# Patient Record
Sex: Female | Born: 1965
Health system: Southern US, Community
[De-identification: ages and names within clinical notes are randomized; demographics above are authoritative.]

## PROBLEM LIST (undated history)

## (undated) ENCOUNTER — Inpatient Hospital Stay: Admission: EM | Disposition: A | Discharge status: Home / Self Care | Payer: Self-pay | Source: Home / Self Care

## (undated) DIAGNOSIS — M810 Age-related osteoporosis without current pathological fracture: Secondary | ICD-10-CM

## (undated) DIAGNOSIS — N189 Chronic kidney disease, unspecified: Secondary | ICD-10-CM

## (undated) DIAGNOSIS — R55 Syncope and collapse: Secondary | ICD-10-CM

## (undated) DIAGNOSIS — F419 Anxiety disorder, unspecified: Secondary | ICD-10-CM

## (undated) DIAGNOSIS — R112 Nausea with vomiting, unspecified: Secondary | ICD-10-CM

## (undated) DIAGNOSIS — E2740 Unspecified adrenocortical insufficiency: Secondary | ICD-10-CM

## (undated) DIAGNOSIS — M1990 Unspecified osteoarthritis, unspecified site: Secondary | ICD-10-CM

## (undated) DIAGNOSIS — Z8489 Family history of other specified conditions: Secondary | ICD-10-CM

## (undated) DIAGNOSIS — E785 Hyperlipidemia, unspecified: Secondary | ICD-10-CM

## (undated) DIAGNOSIS — T4145XA Adverse effect of unspecified anesthetic, initial encounter: Secondary | ICD-10-CM

## (undated) DIAGNOSIS — M5416 Radiculopathy, lumbar region: Secondary | ICD-10-CM

## (undated) DIAGNOSIS — R251 Tremor, unspecified: Secondary | ICD-10-CM

## (undated) DIAGNOSIS — J45909 Unspecified asthma, uncomplicated: Secondary | ICD-10-CM

## (undated) DIAGNOSIS — T8859XA Other complications of anesthesia, initial encounter: Secondary | ICD-10-CM

## (undated) DIAGNOSIS — R51 Headache: Secondary | ICD-10-CM

## (undated) DIAGNOSIS — F32A Depression, unspecified: Secondary | ICD-10-CM

## (undated) DIAGNOSIS — M797 Fibromyalgia: Secondary | ICD-10-CM

## (undated) DIAGNOSIS — Z9889 Other specified postprocedural states: Secondary | ICD-10-CM

## (undated) DIAGNOSIS — M7918 Myalgia, other site: Secondary | ICD-10-CM

## (undated) DIAGNOSIS — I10 Essential (primary) hypertension: Secondary | ICD-10-CM

## (undated) HISTORY — PX: ABDOMINAL HYSTERECTOMY: SHX81

## (undated) HISTORY — PX: BACK SURGERY: SHX140

## (undated) HISTORY — PX: KNEE SURGERY: SHX244

## (undated) HISTORY — PX: OTHER SURGICAL HISTORY: SHX169

## (undated) HISTORY — PX: CHOLECYSTECTOMY: SHX55

## (undated) HISTORY — PX: EYE SURGERY: SHX253

---

## 1997-12-03 ENCOUNTER — Inpatient Hospital Stay (HOSPITAL_COMMUNITY): Admission: RE | Admit: 1997-12-03 | Discharge: 1997-12-06 | Payer: Self-pay | Admitting: Obstetrics & Gynecology

## 2010-03-27 ENCOUNTER — Encounter: Admission: RE | Admit: 2010-03-27 | Discharge: 2010-03-27 | Payer: Self-pay | Admitting: Family Medicine

## 2011-03-22 ENCOUNTER — Other Ambulatory Visit: Payer: Self-pay | Admitting: Family Medicine

## 2011-03-22 DIAGNOSIS — Z1231 Encounter for screening mammogram for malignant neoplasm of breast: Secondary | ICD-10-CM

## 2011-03-30 ENCOUNTER — Ambulatory Visit
Admission: RE | Admit: 2011-03-30 | Discharge: 2011-03-30 | Disposition: A | Discharge status: Home / Self Care | Payer: 59 | Source: Ambulatory Visit | Attending: Family Medicine | Admitting: Family Medicine

## 2011-03-30 DIAGNOSIS — Z1231 Encounter for screening mammogram for malignant neoplasm of breast: Secondary | ICD-10-CM

## 2011-04-02 ENCOUNTER — Other Ambulatory Visit: Payer: Self-pay | Admitting: Family Medicine

## 2011-04-02 DIAGNOSIS — R928 Other abnormal and inconclusive findings on diagnostic imaging of breast: Secondary | ICD-10-CM

## 2011-04-09 ENCOUNTER — Ambulatory Visit
Admission: RE | Admit: 2011-04-09 | Discharge: 2011-04-09 | Disposition: A | Discharge status: Home / Self Care | Payer: 59 | Source: Ambulatory Visit | Attending: Family Medicine | Admitting: Family Medicine

## 2011-04-09 DIAGNOSIS — R928 Other abnormal and inconclusive findings on diagnostic imaging of breast: Secondary | ICD-10-CM

## 2012-04-17 ENCOUNTER — Other Ambulatory Visit: Payer: Self-pay | Admitting: Family Medicine

## 2012-04-17 DIAGNOSIS — Z1231 Encounter for screening mammogram for malignant neoplasm of breast: Secondary | ICD-10-CM

## 2012-05-01 ENCOUNTER — Ambulatory Visit
Admission: RE | Admit: 2012-05-01 | Discharge: 2012-05-01 | Disposition: A | Discharge status: Home / Self Care | Payer: 59 | Source: Ambulatory Visit | Attending: Family Medicine | Admitting: Family Medicine

## 2012-05-01 DIAGNOSIS — Z1231 Encounter for screening mammogram for malignant neoplasm of breast: Secondary | ICD-10-CM

## 2012-07-06 NOTE — Progress Notes (Signed)
stress test  With resting ekg 08-03-2011 dr Lynelle Doctor on chart lov note dr Lynelle Doctor cardiology 07-26-2011 on chart

## 2012-07-07 ENCOUNTER — Ambulatory Visit (HOSPITAL_COMMUNITY)
Admission: RE | Admit: 2012-07-07 | Discharge: 2012-07-07 | Disposition: A | Discharge status: Home / Self Care | Payer: 59 | Source: Ambulatory Visit | Attending: Orthopedic Surgery | Admitting: Orthopedic Surgery

## 2012-07-07 ENCOUNTER — Encounter (HOSPITAL_COMMUNITY)
Admission: RE | Admit: 2012-07-07 | Discharge: 2012-07-07 | Disposition: A | Discharge status: Home / Self Care | Payer: 59 | Source: Ambulatory Visit | Attending: Orthopedic Surgery | Admitting: Orthopedic Surgery

## 2012-07-07 ENCOUNTER — Encounter (HOSPITAL_COMMUNITY): Payer: Self-pay

## 2012-07-07 ENCOUNTER — Ambulatory Visit (HOSPITAL_COMMUNITY)
Admission: RE | Admit: 2012-07-07 | Discharge: 2012-07-07 | Disposition: A | Discharge status: Home / Self Care | Payer: 59 | Source: Ambulatory Visit | Attending: Surgical | Admitting: Surgical

## 2012-07-07 ENCOUNTER — Encounter (HOSPITAL_COMMUNITY): Payer: Self-pay | Admitting: Pharmacy Technician

## 2012-07-07 DIAGNOSIS — M47817 Spondylosis without myelopathy or radiculopathy, lumbosacral region: Secondary | ICD-10-CM | POA: Insufficient documentation

## 2012-07-07 DIAGNOSIS — M545 Low back pain, unspecified: Secondary | ICD-10-CM | POA: Insufficient documentation

## 2012-07-07 DIAGNOSIS — Z01818 Encounter for other preprocedural examination: Secondary | ICD-10-CM | POA: Insufficient documentation

## 2012-07-07 DIAGNOSIS — Z01812 Encounter for preprocedural laboratory examination: Secondary | ICD-10-CM | POA: Insufficient documentation

## 2012-07-07 HISTORY — DX: Adverse effect of unspecified anesthetic, initial encounter: T41.45XA

## 2012-07-07 HISTORY — DX: Essential (primary) hypertension: I10

## 2012-07-07 HISTORY — DX: Other complications of anesthesia, initial encounter: T88.59XA

## 2012-07-07 LAB — CBC
HCT: 42.5 % (ref 36.0–46.0)
MCH: 30.6 pg (ref 26.0–34.0)
MCHC: 33.6 g/dL (ref 30.0–36.0)
MCV: 90.8 fL (ref 78.0–100.0)
Platelets: 253 10*3/uL (ref 150–400)
RDW: 12.7 % (ref 11.5–15.5)
WBC: 11.4 10*3/uL — ABNORMAL HIGH (ref 4.0–10.5)

## 2012-07-07 LAB — COMPREHENSIVE METABOLIC PANEL
ALT: 8 U/L (ref 0–35)
AST: 12 U/L (ref 0–37)
Albumin: 3.5 g/dL (ref 3.5–5.2)
Alkaline Phosphatase: 41 U/L (ref 39–117)
BUN: 13 mg/dL (ref 6–23)
CO2: 31 mEq/L (ref 19–32)
Calcium: 9.1 mg/dL (ref 8.4–10.5)
Chloride: 102 mEq/L (ref 96–112)
Creatinine, Ser: 0.89 mg/dL (ref 0.50–1.10)
GFR calc Af Amer: >90 mL/min (ref >90)
GFR calc non Af Amer: 78 mL/min — ABNORMAL LOW (ref >90)
Glucose, Bld: 97 mg/dL (ref 70–99)
Potassium: 4.2 mEq/L (ref 3.5–5.1)
Sodium: 137 mEq/L (ref 135–145)
Total Bilirubin: 0.5 mg/dL (ref 0.3–1.2)
Total Protein: 6.1 g/dL (ref 6.0–8.3)

## 2012-07-07 LAB — URINALYSIS, ROUTINE W REFLEX MICROSCOPIC
Bilirubin Urine: NEGATIVE
Glucose, UA: NEGATIVE mg/dL
Ketones, ur: NEGATIVE mg/dL
Leukocytes, UA: NEGATIVE
Nitrite: NEGATIVE
Protein, ur: NEGATIVE mg/dL
Specific Gravity, Urine: 1.024 (ref 1.005–1.030)
Urobilinogen, UA: 1 mg/dL (ref 0.0–1.0)
pH: 6.5 (ref 5.0–8.0)

## 2012-07-07 LAB — URINE MICROSCOPIC-ADD ON

## 2012-07-07 LAB — APTT: aPTT: 35 seconds (ref 24–37)

## 2012-07-07 LAB — PROTIME-INR
INR: 1 (ref 0.00–1.49)
Prothrombin Time: 13.1 seconds (ref 11.6–15.2)

## 2012-07-07 NOTE — Patient Instructions (Addendum)
20 MCKENZE NISSLEY  07/07/2012   Your procedure is scheduled on:  07-14-2011  Report to [facility] at  0700 AM.  Call this number if you have problems the morning of surgery: [phone]   Remember:   Do not eat food or drink liquids:After Midnight.  .  Take these medicines the morning of surgery with A SIP OF WATER: xanax, hydrocodone if needed, metoprolol succinate, paxil   Do not wear jewelry or make up.  Do not wear lotions, powders, or perfumes. You may wear deodorant.    Do not bring valuables to the hospital.  Contacts, dentures or bridgework may not be worn into surgery.  Leave suitcase in the car. After surgery it may be brought to your room.  For patients admitted to the hospital, checkout time is 11:00 AM the day of discharge                             Patients discharged the day of surgery will not be allowed to drive home. If going home same day of surgery, you must have someone stay with you the first 24 hours at home and arrange for some one to drive you home from hospital.    Special Instructions: See [facility] Preparing for Surgery instruction sheet. Women do not shave legs or underarms for 12 hours before showers. Men may shave face morning of surgery.    Please read over the following fact sheets that you were given: MRSA Information, incentive spirometer fact sheet  Cain Sieve [facility] pre op nurse phone number [phone], call if needed

## 2012-07-07 NOTE — Progress Notes (Signed)
mirco ua results faxed to dr Darrelyn Hillock by epic

## 2012-07-10 LAB — MRSA CULTURE

## 2012-07-11 NOTE — H&P (Signed)
Abigail [name] DOB: [birth year]  Chief Complaint: back pain  History of Present Illness The patient is a 46 year old female who is scheduled for a central decompression lumbar laminectomy on July 13, 2012 by Dr. Darrelyn Hillock. Abigail [name] presented today with the chief complaint of back pain. She's also having some pain across the greater trochanter of the right hip. She had these issues about 10 months ago when she saw Dr. Darrelyn Hillock. At that time he felt that she was having mostly pain from bursitis in her right hip. He put her on a Prednisone pack. She reports that she did somewhat better in regards to the hip, but over the last month she's had increased pain in her back. She reports that she's had so much pain that she was unable to work. She's having pain when she coughs or sneezes, mainly in the right side of her low back. She has noticed that she does have some pain that radiates from her low back through her right buttock, into the posterior thigh. No numbness or tingling in the legs. She reports that bladder and bowel functions are normal. She's had no difficulty with that. She's not having any issues in the left leg, no pain in the groin, and she's not having any pain into the knees. She does report that, in her job, she is required to stand for long periods of time on concrete, and she feels like this increased her symptoms.MRI shows that she has a large herniated disc at L5-S1 that is central and to the right   Problem List/Past Medical Spinal Stenosis, Lumbar (724.02) Bursitis, Hip (726.5) Depression Hypertension Anxiety Disorder  Allergies No Known Drug Allergies.    Family History Depression. mother and sister Diabetes Mellitus. grandmother mothers side Heart Disease. father Cerebrovascular Accident. grandmother mothers side Congestive Heart Failure. grandmother mothers side Hypertension. mother and father Liver Disease, Chronic. mother   Social  History Exercise. Exercises weekly; does running / walking Children. 2 Current work status. working full time Living situation. live with spouse Tobacco use. current every day smoker; smoke(d) less than 1/2 pack(s) per day Marital status. married Number of flights of stairs before winded. 2-3 Alcohol use. current drinker; drinks beer; only occasionally per week   Past Surgical History Hysterectomy. partial (cancerous)    Review of Systems General:Present- Appetite Loss and Weight Loss. Not Present- Chills, Fever, Night Sweats, Fatigue, Feeling sick and Weight Gain. Skin:Not Present- Itching, Rash, Skin Color Changes, Ulcer, Psoriasis and Change in Hair or Nails. Neck:Not Present- Swollen Glands and Neck Mass. Respiratory:Not Present- Snoring, Chronic Cough, Bloody sputum and Dyspnea. Cardiovascular:Present- Leg Cramps and Palpitations. Not Present- Shortness of Breath, Chest Pain and Swelling of Extremities. Gastrointestinal:Not Present- Bloody Stool, Heartburn, Abdominal Pain, Vomiting, Nausea and Incontinence of Stool. Female Genitourinary:Not Present- Blood in Urine, Menstrual Irregularities, Frequency, Incontinence and Nocturia. Musculoskeletal:Present- Muscle Pain and Back Pain. Not Present- Muscle Weakness, Joint Stiffness, Joint Swelling and Joint Pain. Neurological:Present- Tingling, Numbness and Burning. Not Present- Tremor, Headaches and Dizziness. Psychiatric:Present- Anxiety and Depression. Not Present- Memory Loss. Endocrine:Not Present- Cold Intolerance, Heat Intolerance, Excessive hunger and Excessive Thirst. Hematology:Not Present- Abnormal Bleeding, Anemia, Blood Clots and Easy Bruising.  Vitals Weight: 150 lb Height: 65.25 in Body Surface Area: 1.77 m Body Mass Index: 24.77 kg/m BP: 138/95 (Sitting, Left Arm, Standard)    Physical Exam On physical exam today, she is alert and oriented, in no acute distress. She has a  normal, painless ROM of the hips and the knees. Calves  are soft and nontender. Sensation and circulation are intact in the lower extremities. She is slightly weaker in the right dorsiflexors compared to the left. She does have a positive SLR on the right. She is tender over the greater trochanteric bursa of the right hip, nontender on the left. She does have some tenderness in the paralumbar region. No masses or tumors palpated. She does have pain with lumbar ROM that radiates into the right buttock.Regular rate and rhythm. No murmurs. Lungs clear to auscultation. Abdomen soft and nontender. Neck supple. Oral cavity negative.    RADIOGRAPHS: She does have degenerative discs primarily at L4-5 and L5-S1. No fractures are noted. A review of her films of the hips is unremarkable. No arthritic changes or fractures are noted.    Assessment and Plan Lumbar disc herniation Central decompression lumbar laminectomy L5-S1     [name], PA-C

## 2012-07-11 NOTE — Progress Notes (Signed)
Spoke with tp by phone aware surgery time changed to 1215, arrive 0945 [facility] 07-13-12

## 2012-07-13 ENCOUNTER — Ambulatory Visit (HOSPITAL_COMMUNITY): Payer: 59

## 2012-07-13 ENCOUNTER — Encounter (HOSPITAL_COMMUNITY): Payer: Self-pay | Admitting: *Deleted

## 2012-07-13 ENCOUNTER — Encounter (HOSPITAL_COMMUNITY)
Admission: RE | Disposition: A | Discharge status: Home / Self Care | Payer: Self-pay | Source: Ambulatory Visit | Attending: Orthopedic Surgery

## 2012-07-13 ENCOUNTER — Encounter (HOSPITAL_COMMUNITY): Payer: Self-pay | Admitting: Anesthesiology

## 2012-07-13 ENCOUNTER — Ambulatory Visit (HOSPITAL_COMMUNITY): Payer: 59 | Admitting: Anesthesiology

## 2012-07-13 ENCOUNTER — Observation Stay (HOSPITAL_COMMUNITY)
Admission: RE | Admit: 2012-07-13 | Discharge: 2012-07-14 | DRG: 491 | Disposition: A | Discharge status: Home / Self Care | Payer: 59 | Source: Ambulatory Visit | Attending: Orthopedic Surgery | Admitting: Orthopedic Surgery

## 2012-07-13 DIAGNOSIS — M5106 Intervertebral disc disorders with myelopathy, lumbar region: Principal | ICD-10-CM | POA: Insufficient documentation

## 2012-07-13 DIAGNOSIS — I10 Essential (primary) hypertension: Secondary | ICD-10-CM | POA: Insufficient documentation

## 2012-07-13 DIAGNOSIS — M76899 Other specified enthesopathies of unspecified lower limb, excluding foot: Secondary | ICD-10-CM | POA: Insufficient documentation

## 2012-07-13 DIAGNOSIS — M48061 Spinal stenosis, lumbar region without neurogenic claudication: Secondary | ICD-10-CM | POA: Diagnosis present

## 2012-07-13 HISTORY — PX: LUMBAR LAMINECTOMY/DECOMPRESSION MICRODISCECTOMY: SHX5026

## 2012-07-13 SURGERY — LUMBAR LAMINECTOMY/DECOMPRESSION MICRODISCECTOMY 1 LEVEL
Anesthesia: General | Site: Back | Wound class: Clean

## 2012-07-13 MED ORDER — LACTATED RINGERS IV SOLN
INTRAVENOUS | Status: DC
  Administered 2012-07-13: 1000 mL via INTRAVENOUS
  Administered 2012-07-13: 13:00:00 via INTRAVENOUS

## 2012-07-13 MED ORDER — IRBESARTAN 150 MG PO TABS
150.0000 mg | ORAL_TABLET | Freq: Every day | ORAL | Status: DC
  Administered 2012-07-13 – 2012-07-14 (×2): 150 mg via ORAL
  Filled 2012-07-13 (×2): qty 1

## 2012-07-13 MED ORDER — PAROXETINE HCL 20 MG PO TABS
20.0000 mg | ORAL_TABLET | Freq: Every morning | ORAL | Status: DC
  Administered 2012-07-14: 20 mg via ORAL
  Filled 2012-07-13: qty 1

## 2012-07-13 MED ORDER — ROCURONIUM BROMIDE 100 MG/10ML IV SOLN
INTRAVENOUS | Status: DC | PRN
  Administered 2012-07-13: 40 mg via INTRAVENOUS

## 2012-07-13 MED ORDER — EPHEDRINE SULFATE 50 MG/ML IJ SOLN
INTRAMUSCULAR | Status: DC | PRN
  Administered 2012-07-13: 5 mg via INTRAVENOUS
  Administered 2012-07-13: 10 mg via INTRAVENOUS

## 2012-07-13 MED ORDER — CEFAZOLIN SODIUM 1-5 GM-% IV SOLN
1.0000 g | Freq: Three times a day (TID) | INTRAVENOUS | Status: AC
  Administered 2012-07-13 – 2012-07-14 (×3): 1 g via INTRAVENOUS
  Filled 2012-07-13 (×3): qty 50

## 2012-07-13 MED ORDER — OXYCODONE HCL 5 MG PO TABS: 5.0000 mg | ORAL_TABLET | Freq: Once | ORAL | Status: DC | PRN

## 2012-07-13 MED ORDER — METHOCARBAMOL 100 MG/ML IJ SOLN
500.0000 mg | Freq: Four times a day (QID) | INTRAVENOUS | Status: DC | PRN
  Administered 2012-07-13 (×2): 500 mg via INTRAVENOUS
  Filled 2012-07-13 (×2): qty 5

## 2012-07-13 MED ORDER — MIDAZOLAM HCL 5 MG/5ML IJ SOLN
INTRAMUSCULAR | Status: DC | PRN
  Administered 2012-07-13 (×2): 2 mg via INTRAVENOUS

## 2012-07-13 MED ORDER — HYDROMORPHONE HCL PF 1 MG/ML IJ SOLN
0.5000 mg | INTRAMUSCULAR | Status: DC | PRN
  Administered 2012-07-13 – 2012-07-14 (×5): 1 mg via INTRAVENOUS
  Filled 2012-07-13 (×5): qty 1

## 2012-07-13 MED ORDER — HYDROMORPHONE HCL PF 1 MG/ML IJ SOLN: 0.2500 mg | INTRAMUSCULAR | Status: DC | PRN

## 2012-07-13 MED ORDER — CEFAZOLIN SODIUM-DEXTROSE 2-3 GM-% IV SOLR
2.0000 g | INTRAVENOUS | Status: AC
  Administered 2012-07-13: 2 g via INTRAVENOUS

## 2012-07-13 MED ORDER — ACETAMINOPHEN 325 MG PO TABS
650.0000 mg | ORAL_TABLET | ORAL | Status: DC | PRN
  Administered 2012-07-14: 650 mg via ORAL
  Filled 2012-07-13: qty 2

## 2012-07-13 MED ORDER — ACETAMINOPHEN 10 MG/ML IV SOLN
INTRAVENOUS | Status: AC
  Filled 2012-07-13: qty 100

## 2012-07-13 MED ORDER — LIDOCAINE HCL (CARDIAC) 20 MG/ML IV SOLN
INTRAVENOUS | Status: DC | PRN
  Administered 2012-07-13: 80 mg via INTRAVENOUS

## 2012-07-13 MED ORDER — METHOCARBAMOL 500 MG PO TABS
500.0000 mg | ORAL_TABLET | Freq: Four times a day (QID) | ORAL | Status: DC | PRN
  Administered 2012-07-14: 500 mg via ORAL
  Filled 2012-07-13: qty 1

## 2012-07-13 MED ORDER — FLEET ENEMA 7-19 GM/118ML RE ENEM: 1.0000 | ENEMA | Freq: Once | RECTAL | Status: AC | PRN

## 2012-07-13 MED ORDER — ASPIRIN EC 81 MG PO TBEC
81.0000 mg | DELAYED_RELEASE_TABLET | Freq: Every day | ORAL | Status: DC
  Administered 2012-07-13 – 2012-07-14 (×2): 81 mg via ORAL
  Filled 2012-07-13 (×2): qty 1

## 2012-07-13 MED ORDER — ALPRAZOLAM 0.5 MG PO TABS
0.5000 mg | ORAL_TABLET | Freq: Four times a day (QID) | ORAL | Status: DC | PRN
  Administered 2012-07-13 (×2): 0.5 mg via ORAL
  Filled 2012-07-13 (×2): qty 1

## 2012-07-13 MED ORDER — ACETAMINOPHEN 650 MG RE SUPP: 650.0000 mg | RECTAL | Status: DC | PRN

## 2012-07-13 MED ORDER — BUPIVACAINE LIPOSOME 1.3 % IJ SUSP
20.0000 mL | INTRAMUSCULAR | Status: AC
  Administered 2012-07-13: 20 mL
  Filled 2012-07-13: qty 20

## 2012-07-13 MED ORDER — BACITRACIN-NEOMYCIN-POLYMYXIN 400-5-5000 EX OINT
TOPICAL_OINTMENT | CUTANEOUS | Status: DC | PRN
  Administered 2012-07-13: 1 via TOPICAL

## 2012-07-13 MED ORDER — PROPOFOL 10 MG/ML IV BOLUS
INTRAVENOUS | Status: DC | PRN
  Administered 2012-07-13: 160 mg via INTRAVENOUS

## 2012-07-13 MED ORDER — CEFAZOLIN SODIUM-DEXTROSE 2-3 GM-% IV SOLR
INTRAVENOUS | Status: AC
  Filled 2012-07-13: qty 50

## 2012-07-13 MED ORDER — BACITRACIN ZINC 500 UNIT/GM EX OINT
TOPICAL_OINTMENT | CUTANEOUS | Status: AC
  Filled 2012-07-13: qty 15

## 2012-07-13 MED ORDER — METOPROLOL SUCCINATE ER 25 MG PO TB24
25.0000 mg | ORAL_TABLET | Freq: Two times a day (BID) | ORAL | Status: DC
  Administered 2012-07-13 – 2012-07-14 (×2): 25 mg via ORAL
  Filled 2012-07-13 (×3): qty 1

## 2012-07-13 MED ORDER — BUPIVACAINE HCL (PF) 0.25 % IJ SOLN
INTRAMUSCULAR | Status: DC | PRN
  Administered 2012-07-13: 20 mL

## 2012-07-13 MED ORDER — MENTHOL 3 MG MT LOZG: 1.0000 | LOZENGE | OROMUCOSAL | Status: DC | PRN

## 2012-07-13 MED ORDER — POLYETHYLENE GLYCOL 3350 17 G PO PACK: 17.0000 g | PACK | Freq: Every day | ORAL | Status: DC | PRN

## 2012-07-13 MED ORDER — AMLODIPINE BESYLATE 5 MG PO TABS
5.0000 mg | ORAL_TABLET | Freq: Every day | ORAL | Status: DC
  Administered 2012-07-13 – 2012-07-14 (×2): 5 mg via ORAL
  Filled 2012-07-13 (×2): qty 1

## 2012-07-13 MED ORDER — FENTANYL CITRATE 0.05 MG/ML IJ SOLN
INTRAMUSCULAR | Status: DC | PRN
  Administered 2012-07-13: 50 ug via INTRAVENOUS
  Administered 2012-07-13 (×2): 100 ug via INTRAVENOUS

## 2012-07-13 MED ORDER — OXYCODONE HCL 5 MG/5ML PO SOLN
5.0000 mg | Freq: Once | ORAL | Status: DC | PRN
Start: 2012-07-13 — End: 2012-07-13
  Filled 2012-07-13: qty 5

## 2012-07-13 MED ORDER — ACETAMINOPHEN 10 MG/ML IV SOLN
INTRAVENOUS | Status: DC | PRN
  Administered 2012-07-13: 1000 mg via INTRAVENOUS

## 2012-07-13 MED ORDER — MEPERIDINE HCL 50 MG/ML IJ SOLN: 6.2500 mg | INTRAMUSCULAR | Status: DC | PRN

## 2012-07-13 MED ORDER — SODIUM CHLORIDE 0.9 % IR SOLN
Status: DC | PRN
  Administered 2012-07-13: 13:00:00

## 2012-07-13 MED ORDER — THROMBIN 5000 UNITS EX SOLR
CUTANEOUS | Status: AC
  Filled 2012-07-13: qty 10000

## 2012-07-13 MED ORDER — OXYCODONE-ACETAMINOPHEN 5-325 MG PO TABS
1.0000 | ORAL_TABLET | ORAL | Status: DC | PRN
  Administered 2012-07-13 – 2012-07-14 (×5): 2 via ORAL
  Filled 2012-07-13 (×5): qty 2

## 2012-07-13 MED ORDER — PROMETHAZINE HCL 25 MG/ML IJ SOLN: 6.2500 mg | INTRAMUSCULAR | Status: DC | PRN

## 2012-07-13 MED ORDER — AMLODIPINE-OLMESARTAN 5-20 MG PO TABS: 1.0000 | ORAL_TABLET | Freq: Every day | ORAL | Status: DC

## 2012-07-13 MED ORDER — THROMBIN 5000 UNITS EX SOLR
CUTANEOUS | Status: DC | PRN
  Administered 2012-07-13: 5000 [IU] via TOPICAL

## 2012-07-13 MED ORDER — HYDROMORPHONE HCL PF 1 MG/ML IJ SOLN
INTRAMUSCULAR | Status: AC
  Filled 2012-07-13: qty 1

## 2012-07-13 MED ORDER — BISACODYL 10 MG RE SUPP: 10.0000 mg | Freq: Every day | RECTAL | Status: DC | PRN

## 2012-07-13 MED ORDER — ACETAMINOPHEN 10 MG/ML IV SOLN: 1000.0000 mg | Freq: Once | INTRAVENOUS | Status: DC | PRN

## 2012-07-13 MED ORDER — AMLODIPINE BESYLATE 5 MG PO TABS
5.0000 mg | ORAL_TABLET | Freq: Once | ORAL | Status: AC
  Administered 2012-07-13: 5 mg via ORAL
  Filled 2012-07-13: qty 1

## 2012-07-13 MED ORDER — LACTATED RINGERS IV SOLN
INTRAVENOUS | Status: DC
  Administered 2012-07-13: 15:00:00 via INTRAVENOUS

## 2012-07-13 MED ORDER — ONDANSETRON HCL 4 MG/2ML IJ SOLN: 4.0000 mg | INTRAMUSCULAR | Status: DC | PRN

## 2012-07-13 MED ORDER — ONDANSETRON HCL 4 MG/2ML IJ SOLN
INTRAMUSCULAR | Status: DC | PRN
  Administered 2012-07-13: 4 mg via INTRAVENOUS

## 2012-07-13 MED ORDER — PHENOL 1.4 % MT LIQD: 1.0000 | OROMUCOSAL | Status: DC | PRN

## 2012-07-13 MED ORDER — HYDROCODONE-ACETAMINOPHEN 5-325 MG PO TABS: 1.0000 | ORAL_TABLET | ORAL | Status: DC | PRN

## 2012-07-13 SURGICAL SUPPLY — 42 items
BAG ZIPLOCK 12X15 (MISCELLANEOUS) ×2 IMPLANT
BENZOIN TINCTURE PRP APPL 2/3 (GAUZE/BANDAGES/DRESSINGS) ×2 IMPLANT
CLEANER TIP ELECTROSURG 2X2 (MISCELLANEOUS) ×2 IMPLANT
CLOTH BEACON ORANGE TIMEOUT ST (SAFETY) ×2 IMPLANT
CONT SPECI 4OZ STER CLIK (MISCELLANEOUS) ×2 IMPLANT
DRAIN PENROSE 18X1/4 LTX STRL (WOUND CARE) IMPLANT
DRAPE MICROSCOPE LEICA (MISCELLANEOUS) ×2 IMPLANT
DRAPE POUCH INSTRU U-SHP 10X18 (DRAPES) ×2 IMPLANT
DRAPE SURG 17X11 SM STRL (DRAPES) ×2 IMPLANT
DRSG ADAPTIC 3X8 NADH LF (GAUZE/BANDAGES/DRESSINGS) ×2 IMPLANT
DRSG PAD ABDOMINAL 8X10 ST (GAUZE/BANDAGES/DRESSINGS) ×2 IMPLANT
DURAPREP 26ML APPLICATOR (WOUND CARE) ×2 IMPLANT
ELECT REM PT RETURN 9FT ADLT (ELECTROSURGICAL) ×2
ELECTRODE REM PT RTRN 9FT ADLT (ELECTROSURGICAL) ×1 IMPLANT
GLOVE BIOGEL PI IND STRL 8 (GLOVE) ×2 IMPLANT
GLOVE BIOGEL PI INDICATOR 8 (GLOVE) ×2
GLOVE ECLIPSE 8.0 STRL XLNG CF (GLOVE) ×4 IMPLANT
GOWN PREVENTION PLUS LG XLONG (DISPOSABLE) ×4 IMPLANT
GOWN STRL REIN XL XLG (GOWN DISPOSABLE) ×4 IMPLANT
KIT BASIN OR (CUSTOM PROCEDURE TRAY) ×2 IMPLANT
KIT POSITIONING SURG ANDREWS (MISCELLANEOUS) ×2 IMPLANT
MANIFOLD NEPTUNE II (INSTRUMENTS) ×2 IMPLANT
NEEDLE SPNL 18GX3.5 QUINCKE PK (NEEDLE) ×4 IMPLANT
NS IRRIG 1000ML POUR BTL (IV SOLUTION) ×2 IMPLANT
PATTIES SURGICAL .5 X.5 (GAUZE/BANDAGES/DRESSINGS) IMPLANT
PATTIES SURGICAL .75X.75 (GAUZE/BANDAGES/DRESSINGS) IMPLANT
PATTIES SURGICAL 1X1 (DISPOSABLE) IMPLANT
PIN SAFETY NICK PLATE  2 MED (MISCELLANEOUS)
PIN SAFETY NICK PLATE 2 MED (MISCELLANEOUS) IMPLANT
POSITIONER SURGICAL ARM (MISCELLANEOUS) ×2 IMPLANT
SPONGE GAUZE 4X4 12PLY (GAUZE/BANDAGES/DRESSINGS) ×2 IMPLANT
SPONGE LAP 4X18 X RAY DECT (DISPOSABLE) IMPLANT
SPONGE SURGIFOAM ABS GEL 100 (HEMOSTASIS) ×2 IMPLANT
STAPLER VISISTAT 35W (STAPLE) IMPLANT
SUT VIC AB 0 CT1 27 (SUTURE) ×1
SUT VIC AB 0 CT1 27XBRD ANTBC (SUTURE) ×1 IMPLANT
SUT VIC AB 1 CT1 27 (SUTURE) ×4
SUT VIC AB 1 CT1 27XBRD ANTBC (SUTURE) ×4 IMPLANT
TAPE CLOTH SURG 6X10 WHT LF (GAUZE/BANDAGES/DRESSINGS) ×2 IMPLANT
TOWEL OR 17X26 10 PK STRL BLUE (TOWEL DISPOSABLE) ×4 IMPLANT
TRAY LAMINECTOMY (CUSTOM PROCEDURE TRAY) ×2 IMPLANT
WATER STERILE IRR 1500ML POUR (IV SOLUTION) ×2 IMPLANT

## 2012-07-13 NOTE — Brief Op Note (Signed)
07/13/2012  1:43 PM  PATIENT:  Abigail [name]  46 y.o. female  PRE-OPERATIVE DIAGNOSIS:  herniated disc L-5_S-1 on the Right and Spinal Stenosis.  POST-OPERATIVE DIAGNOSIS:  herniated disc L-5_S-1 on the right and Spinal Stenosis.  PROCEDURE:  Procedure(s) (LRB) with comments: LUMBAR LAMINECTOMY/DECOMPRESSION MICRODISCECTOMY 1 LEVEL (N/A) - Central Decompression Lumbar Laminectomy L5 - S1 (X-Ray) for Spinal Stenosis and Large Herniated Lumbar Disc.  SURGEON:  Surgeon(s) and Role:    * Jacki Cones, MD - Primary    * Javier Docker, MD - Assisting   ASSISTANTS: Antionette Poles MD  ANESTHESIA:   general  EBL:  Total I/O In: 1000 [I.V.:1000] Out: -   BLOOD ADMINISTERED:none  DRAINS: none   LOCAL MEDICATIONS USED:  MARCAINE 0.25%,20cc at beginning of case and Bupivicaine 20cc at end of case.    SPECIMEN:  Source of Specimen:  L-5_S-1 interspace  DISPOSITION OF SPECIMEN:  PATHOLOGY  COUNTS:  YES  TOURNIQUET:  * No tourniquets in log *  DICTATION: .Other Dictation: Dictation Number [id number]  PLAN OF CARE: Admit to inpatient   PATIENT DISPOSITION:  PACU - hemodynamically stable.   Delay start of Pharmacological VTE agent (>24hrs) due to surgical blood loss or risk of bleeding: yes

## 2012-07-13 NOTE — Transfer of Care (Signed)
Immediate Anesthesia Transfer of Care Note  Patient: Abigail [name]  Procedure(s) Performed: Procedure(s) (LRB) with comments: LUMBAR LAMINECTOMY/DECOMPRESSION MICRODISCECTOMY 1 LEVEL (N/A) - Central Decompression Lumbar Laminectomy L5 - S1 (X-Ray)  Patient Location: PACU  Anesthesia Type: General  Level of Consciousness: awake, alert  and oriented  Airway & Oxygen Therapy: Patient Spontanous Breathing and Patient connected to face mask oxygen  Post-op Assessment: Report given to PACU RN and Post -op Vital signs reviewed and stable  Post vital signs: Reviewed and stable  Complications: No apparent anesthesia complications

## 2012-07-13 NOTE — Anesthesia Postprocedure Evaluation (Signed)
Anesthesia Post Note  Patient: Abigail [name]  Procedure(s) Performed: Procedure(s) (LRB): LUMBAR LAMINECTOMY/DECOMPRESSION MICRODISCECTOMY 1 LEVEL (N/A)  Anesthesia type: General  Patient location: PACU  Post pain: Pain level controlled  Post assessment: Post-op Vital signs reviewed  Last Vitals: BP 103/68  Pulse 64  Temp 36.8 C  Resp 17  Ht 5\' 6"  (1.676 m)  Wt 133 lb (60.328 kg)  BMI 21.47 kg/m2  SpO2 96%  Post vital signs: Reviewed  Level of consciousness: sedated  Complications: No apparent anesthesia complications

## 2012-07-13 NOTE — Anesthesia Preprocedure Evaluation (Addendum)
Anesthesia Evaluation  Patient identified by MRN, date of birth, ID band Patient awake    Reviewed: Allergy & Precautions, H&P , NPO status , Patient's Chart, lab work & pertinent test results, reviewed documented beta blocker date and time   History of Anesthesia Complications Negative for: history of anesthetic complications  Airway Mallampati: II TM Distance: >3 FB Neck ROM: Full    Dental  (+) Dental Advisory Given and Teeth Intact   Pulmonary neg pulmonary ROS,  breath sounds clear to auscultation  Pulmonary exam normal       Cardiovascular hypertension, Pt. on medications and Pt. on home beta blockers - CAD, - Past MI and - CHF Rhythm:Regular Rate:Normal     Neuro/Psych negative neurological ROS  negative psych ROS   GI/Hepatic negative GI ROS, Neg liver ROS,   Endo/Other  negative endocrine ROS  Renal/GU negative Renal ROS     Musculoskeletal   Abdominal   Peds  Hematology negative hematology ROS (+)   Anesthesia Other Findings   Reproductive/Obstetrics                          Anesthesia Physical Anesthesia Plan  ASA: II  Anesthesia Plan: General   Post-op Pain Management:    Induction: Intravenous  Airway Management Planned: Oral ETT  Additional Equipment:   Intra-op Plan:   Post-operative Plan: Extubation in OR  Informed Consent: I have reviewed the patients History and Physical, chart, labs and discussed the procedure including the risks, benefits and alternatives for the proposed anesthesia with the patient or authorized representative who has indicated his/her understanding and acceptance.   Dental advisory given  Plan Discussed with: CRNA  Anesthesia Plan Comments:         Anesthesia Quick Evaluation

## 2012-07-13 NOTE — Interval H&P Note (Signed)
History and Physical Interval Note:  07/13/2012 11:34 AM  Abigail [name]  has presented today for surgery, with the diagnosis of herniated disc  The various methods of treatment have been discussed with the patient and family. After consideration of risks, benefits and other options for treatment, the patient has consented to  Procedure(s) (LRB) with comments: LUMBAR LAMINECTOMY/DECOMPRESSION MICRODISCECTOMY 1 LEVEL (N/A) - Central Decompression Lumbar Laminectomy L5 - S1 (X-Ray) as a surgical intervention .  The patient's history has been reviewed, patient examined, no change in status, stable for surgery.  I have reviewed the patient's chart and labs.  Questions were answered to the patient's satisfaction.     Laci Frenkel A

## 2012-07-14 ENCOUNTER — Encounter (HOSPITAL_COMMUNITY): Payer: Self-pay | Admitting: Orthopedic Surgery

## 2012-07-14 MED ORDER — METHOCARBAMOL 500 MG PO TABS: 500.0000 mg | ORAL_TABLET | Freq: Four times a day (QID) | ORAL | Status: DC | PRN

## 2012-07-14 MED ORDER — OXYCODONE-ACETAMINOPHEN 5-325 MG PO TABS: 1.0000 | ORAL_TABLET | ORAL | Status: DC | PRN

## 2012-07-14 NOTE — Discharge Summary (Signed)
Physician Discharge Summary  Patient ID: Abigail [name] MRN: [id number] DOB/AGE: [birth year] 44 y.o.  Admit date: 07/13/2012 Discharge date: 07/14/2012  Admission Diagnoses:Herniated Lumbar Disc at L-5_S-1 on right.  Discharge Diagnoses:  Active Problems:  Intervertebral disc disorder with myelopathy, lumbar region  Spinal stenosis, lumbar   Discharged Condition: Improved  Hospital Course: No Complications  Consults: Physical Therapy  Significant Diagnostic Studies: radiology: X-Ray: In OR  Treatments: antibiotics: Ancef  Discharge Exam: Blood pressure 112/65, pulse 60, temperature 98.1 F (36.7 C), temperature source Oral, resp. rate 16, height 5\' 6"  (1.676 m), weight 60.328 kg (133 lb), SpO2 96.00%. Neurologic: Grossly normal  Disposition:   Discharge Orders    Future Orders Please Complete By Expires   Diet - low sodium heart healthy      Call MD / Call 911      Comments:   If you experience chest pain or shortness of breath, CALL 911 and be transported to the hospital emergency room.  If you develope a fever above 101 F, pus (white drainage) or increased drainage or redness at the wound, or calf pain, call your surgeon's office.   Constipation Prevention      Comments:   Drink plenty of fluids.  Prune juice may be helpful.  You may use a stool softener, such as Colace (over the counter) 100 mg twice a day.  Use MiraLax (over the counter) for constipation as needed.   Increase activity slowly as tolerated      Discharge instructions      Comments:   Change your dressing daily. Shower only, no tub bath. Call if any temperatures greater than 101 or any wound complications: [phone] during the day and ask for Dr. Jeannetta Ellis nurse, Mackey [facility].   Driving restrictions      Comments:   No driving for one weeks       Med List    Warning       Cannot display patient medications because the patient has not yet arrived.          Signed: Lysa Livengood  A 07/14/2012, 7:49 AM

## 2012-07-14 NOTE — Op Note (Signed)
NAMETAMSEN, REIST              ACCOUNT NO.:  [id number]  MEDICAL RECORD NO.:  [id number]  LOCATION:  1601                         FACILITY:  [facility]  PHYSICIAN:  Georges Lynch. Gus Littler, M.D.DATE OF BIRTH:  1966-01-27  DATE OF PROCEDURE:  07/13/2012 DATE OF DISCHARGE:                              OPERATIVE REPORT   PREOPERATIVE DIAGNOSES: 1. Spinal stenosis, L5-S1. 2. Large central into the right herniated lumbar disk at L5-S1.  All     her symptoms were in the right lower extremity with minimal on the     left.  SURGEON:  Georges Lynch. Darrelyn Hillock, MD  ASSISTANT:  Jene Every, MD  OPERATION: 1. Decompression, lumbar laminectomy for spinal stenosis at L5-S1. 2. Microdiskectomy L5-S1 on the right.  PROCEDURE:  Under general anesthesia, routine orthopedic prep and drape was carried out with the patient on spinal table.  At this time, the appropriate time-out was carried out in usual fashion prior to making incision.  Also marked the appropriate right side of the back in the holding area.  Two needles were placed in the back for localization purposes.  X-ray was taken.  Incision was made over L5-S1.  Bleeders identified and cauterized.  Prior to making the incision, I injected 20 mL of 0.25% Marcaine with epinephrine on each side of the lamina for bleeding control.  The incision then was carried down to the L5-S1 space.  An x-ray was taken to verify the position.  At this point, we did our decompressive lumbar laminectomy at L5-S1 on the right for severe spinal stenosis.  On the AP view of the back, she had a complete overlap or shingle effect of the L5 lamina.  At this particular time, I then carried out my hemilaminectomy and decompressed the lateral recess. We brought the microscope in, protected the dura at all times with the D'Errico retractor and cottonoids.  We first identified the S1 root.  We gradually retracted the root and then cauterized lateral recess veins. We had to go far  out wide to decompress the recess because of stenosis. We then did a nice foraminotomy for the S1 root as well.  The hockey- stick was inserted proximally and distally to make sure we had clearance of the stenosis in both directions.  Following that, we isolated the disk, cruciate incision was made in the disk space.  We then did a microdiskectomy in the usual fashion.  Note, the disk space was collapsed and all the material was subligamentous.  We teased the material out with a Epstein curette and the nerve hook.  After we thoroughly decompressed the dura and root, we re-examined the area to make sure we had a complete diskectomy with that.  The root now was totally free and easily movable as well as the dura.  Thoroughly irrigated out the area, loosely applied some thrombin-soaked Gelfoam, closed the wound layers in usual fashion except I left a small distal deep and proximal deep portion wound open for drainage purposes.  Subcu was closed with 0 Vicryl.  Skin was closed with metal staples.  Note, I injected 20 mL of Exparel into the muscles at the end of the procedure.  Sterile Neosporin dressings  were applied.  She had 2 g of IV Ancef.          ______________________________ Georges Lynch. Darrelyn Hillock, M.D.     RAG/MEDQ  D:  07/13/2012  T:  07/14/2012  Job:  [id number]

## 2012-07-14 NOTE — Progress Notes (Signed)
Subjective: Much better today. She some right leg pain today.    Objective: Vital signs in last 24 hours: Temp:  [97.3 F (36.3 C)-98.3 F (36.8 C)] 98.1 F (36.7 C) (10/04 0550) Pulse Rate:  [51-73] 60  (10/04 0550) Resp:  [14-20] 16  (10/04 0550) BP: (98-113)/(52-71) 112/65 mmHg (10/04 0550) SpO2:  [93 %-100 %] 96 % (10/04 0550) Weight:  [60.328 kg (133 lb)] 60.328 kg (133 lb) (10/03 1500)  Intake/Output from previous day: 10/03 0701 - 10/04 0700 In: 3355.3 [P.O.:240; I.V.:2910.3; IV Piggyback:205] Out: 2775 [Urine:2775] Intake/Output this shift:    No results found for this basename: HGB:5 in the last 72 hours No results found for this basename: WBC:2,RBC:2,HCT:2,PLT:2 in the last 72 hours No results found for this basename: NA:2,K:2,CL:2,CO2:2,BUN:2,CREATININE:2,GLUCOSE:2,CALCIUM:2 in the last 72 hours No results found for this basename: LABPT:2,INR:2 in the last 72 hours  Neurovascular intact  Assessment/Plan: DC Today after ambulation.   Derryl Uher A 07/14/2012, 7:14 AM

## 2012-07-14 NOTE — Evaluation (Signed)
Occupational Therapy Evaluation Patient Details Name: Abigail [name] MRN: [id number] DOB: 1966-01-06 Today's Date: 07/14/2012 Time: 4098-1191 OT Time Calculation (min): 16 min  OT Assessment / Plan / Recommendation Clinical Impression  This 46 year old female was admitted for L4-5 decompression.  All education was completed and pt/husband verbalized understanding.  No further OT is needed at this time.      OT Assessment       Follow Up Recommendations  No OT follow up    Barriers to Discharge      Equipment Recommendations  Rolling walker with 5" wheels;3 in 1 bedside comode (pt unable to borrow)    Recommendations for Other Services    Frequency       Precautions / Restrictions Precautions Precautions: Back Precaution Booklet Issued: Yes (comment) Restrictions Weight Bearing Restrictions: No   Pertinent Vitals/Pain 5/10 back; repositioned    ADL  Toilet Transfer: Simulated;Min guard (chair ambulated in room and back to chair) Tub/Shower Transfer Method:  (educated on side stepping over tub) Transfers/Ambulation Related to ADLs: min guard walking and mod cues for back precautions:  husband present and good with cueing pt ADL Comments: reviewed adls with back precautions:  pt had a little pulling when crossing legs: showed alternative methods    OT Diagnosis:    OT Problem List:   OT Treatment Interventions:     OT Goals    Visit Information  Last OT Received On: 07/14/12 Assistance Needed: +1    Subjective Data  Subjective: I found that out the hard way   Prior Functioning     Home Living Lives With: Spouse Available Help at Discharge: Family Type of Home: House Home Access: Stairs to enter Secretary/administrator of Steps: 4 Entrance Stairs-Rails: Right;Left Home Layout: One level Bathroom Shower/Tub: Engineer, manufacturing systems: Standard Home Adaptive Equipment: Straight cane (Pt thinks she can borrow RW) Additional Comments: has old tub:  3:1  may fit inside Prior Function Level of Independence: Independent Able to Take Stairs?: Yes Driving: Yes Comments: husband will be with her for 4 days Communication Communication: No difficulties         Vision/Perception     Cognition  Overall Cognitive Status: Appears within functional limits for tasks assessed/performed Arousal/Alertness: Awake/alert Orientation Level: Appears intact for tasks assessed Behavior During Session: [facility] for tasks performed Cognition - Other Comments: needs cues for back precautions    Extremity/Trunk Assessment Right Upper Extremity Assessment RUE ROM/Strength/Tone: [facility] for tasks assessed Left Upper Extremity Assessment LUE ROM/Strength/Tone: [facility] for tasks assessed Right Lower Extremity Assessment RLE ROM/Strength/Tone: [facility] for tasks assessed Left Lower Extremity Assessment LLE ROM/Strength/Tone: [facility] for tasks assessed     Mobility Bed Mobility Bed Mobility: Supine to Sit;Sit to Supine Supine to Sit: 4: Min guard;5: Supervision Sit to Supine: 4: Min guard Details for Bed Mobility Assistance: cues for correct log roll technique and move to upright sitting Transfers Sit to Stand: 4: Min guard Stand to Sit: 4: Min guard;5: Supervision Details for Transfer Assistance: cues for use of UEs to self assist and to limit fwd flex     Shoulder Instructions     Exercise     Balance     End of Session    GO     Abigail [name] 07/14/2012, 9:15 AM Marica Otter, OTR/L [phone] 07/14/2012

## 2012-07-14 NOTE — Evaluation (Signed)
Physical Therapy Evaluation Patient Details Name: Abigail [name] MRN: [id number] DOB: 07/28/1966 Today's Date: 07/14/2012 Time: 6962-9528 PT Time Calculation (min): 20 min  PT Assessment / Plan / Recommendation Clinical Impression  Pt s/p back surgery presents with functional mobility limitations 2* back pain and post op back precautions    PT Assessment  Patient needs continued PT services    Follow Up Recommendations  No PT follow up    Barriers to Discharge        Equipment Recommendations  None recommended by PT (Pt states she can borrow RW)    Recommendations for Other Services OT consult   Frequency 7X/week    Precautions / Restrictions Precautions Precautions: Back Precaution Booklet Issued: Yes (comment) Restrictions Weight Bearing Restrictions: No   Pertinent Vitals/Pain 5/10; premedicated      Mobility  Bed Mobility Bed Mobility: Supine to Sit;Sit to Supine Supine to Sit: 4: Min guard;5: Supervision Sit to Supine: 4: Min guard Details for Bed Mobility Assistance: cues for correct log roll technique and move to upright sitting Transfers Transfers: Stand to Sit;Sit to Stand Sit to Stand: 4: Min guard;5: Supervision Stand to Sit: 4: Min guard;5: Supervision Details for Transfer Assistance: cues for use of UEs to self assist and to limit fwd flex Ambulation/Gait Ambulation/Gait Assistance: 4: Min guard;5: Supervision Ambulation Distance (Feet): 200 Feet Assistive device: Rolling walker Ambulation/Gait Assistance Details: Pt requires min assist without RW.  Cues for position from RW, posture and Narrowed BOS Gait Pattern: Step-to pattern;Step-through pattern Stairs: Yes Stairs Assistance: 4: Min assist Stairs Assistance Details (indicate cue type and reason): cues for sequence and for method for husband to assist Stair Management Technique: One rail Left;Step to pattern;Forwards Number of Stairs: 3     Shoulder Instructions     Exercises     PT  Diagnosis: Difficulty walking  PT Problem List: Decreased activity tolerance;Decreased mobility;Decreased knowledge of use of DME;Pain;Decreased knowledge of precautions PT Treatment Interventions: DME instruction;Gait training;Stair training;Functional mobility training;Therapeutic activities;Patient/family education   PT Goals Acute Rehab PT Goals PT Goal Formulation: With patient Time For Goal Achievement: 07/16/12 Potential to Achieve Goals: Good Pt will go Supine/Side to Sit: with supervision PT Goal: Supine/Side to Sit - Progress: Goal set today Pt will go Sit to Supine/Side: with supervision PT Goal: Sit to Supine/Side - Progress: Goal set today Pt will go Sit to Stand: with supervision PT Goal: Sit to Stand - Progress: Goal set today Pt will go Stand to Sit: with supervision PT Goal: Stand to Sit - Progress: Goal set today Pt will Ambulate: >150 feet;with rolling walker;with supervision PT Goal: Ambulate - Progress: Goal set today Pt will Go Up / Down Stairs: 3-5 stairs;with min assist;with least restrictive assistive device PT Goal: Up/Down Stairs - Progress: Goal set today  Visit Information  Last PT Received On: 07/14/12 Assistance Needed: +1    Subjective Data  Subjective: I was up to the bathroom but had to lean on my husband to get there Patient Stated Goal: resume previous lifestyle with decreased pain   Prior Functioning  Home Living Lives With: Spouse Available Help at Discharge: Family Type of Home: House Home Access: Stairs to enter Secretary/administrator of Steps: 4 Entrance Stairs-Rails: Right;Left Home Layout: One level Home Adaptive Equipment: Straight cane (Pt thinks she can borrow RW) Prior Function Level of Independence: Independent Able to Take Stairs?: Yes Driving: Yes Communication Communication: No difficulties    Cognition  Overall Cognitive Status: Appears within functional limits for  tasks assessed/performed Arousal/Alertness:  Awake/alert Orientation Level: Appears intact for tasks assessed Behavior During Session: [facility] for tasks performed    Extremity/Trunk Assessment Right Upper Extremity Assessment RUE ROM/Strength/Tone: [facility] for tasks assessed Left Upper Extremity Assessment LUE ROM/Strength/Tone: [facility] for tasks assessed Right Lower Extremity Assessment RLE ROM/Strength/Tone: [facility] for tasks assessed Left Lower Extremity Assessment LLE ROM/Strength/Tone: [facility] for tasks assessed   Balance    End of Session PT - End of Session Activity Tolerance: Patient tolerated treatment well Patient left: in chair;with call bell/phone within reach;with family/visitor present Nurse Communication: Mobility status  GP     Manish Ruggiero 07/14/2012, 9:06 AM

## 2012-07-14 NOTE — Progress Notes (Signed)
D/C instructions reviewed w/ pt and SO. All questions answered, no further questions. Pt d/c in w/c to SO's car by NT. Pt in stable condition, in possession of d/c instructions, scripts, and all personal belongings including newly delivered walker.

## 2013-02-22 ENCOUNTER — Encounter (HOSPITAL_COMMUNITY): Payer: Self-pay | Admitting: Pharmacy Technician

## 2013-02-28 ENCOUNTER — Encounter (HOSPITAL_COMMUNITY): Payer: Self-pay

## 2013-02-28 ENCOUNTER — Encounter (HOSPITAL_COMMUNITY)
Admission: RE | Admit: 2013-02-28 | Discharge: 2013-02-28 | Disposition: A | Discharge status: Home / Self Care | Payer: 59 | Source: Ambulatory Visit | Attending: Orthopedic Surgery | Admitting: Orthopedic Surgery

## 2013-02-28 ENCOUNTER — Ambulatory Visit (HOSPITAL_COMMUNITY)
Admission: RE | Admit: 2013-02-28 | Discharge: 2013-02-28 | Disposition: A | Discharge status: Home / Self Care | Payer: 59 | Source: Ambulatory Visit | Attending: Orthopedic Surgery | Admitting: Orthopedic Surgery

## 2013-02-28 DIAGNOSIS — Z01818 Encounter for other preprocedural examination: Secondary | ICD-10-CM | POA: Insufficient documentation

## 2013-02-28 DIAGNOSIS — M5137 Other intervertebral disc degeneration, lumbosacral region: Secondary | ICD-10-CM | POA: Insufficient documentation

## 2013-02-28 DIAGNOSIS — Z01810 Encounter for preprocedural cardiovascular examination: Secondary | ICD-10-CM | POA: Insufficient documentation

## 2013-02-28 DIAGNOSIS — M51379 Other intervertebral disc degeneration, lumbosacral region without mention of lumbar back pain or lower extremity pain: Secondary | ICD-10-CM | POA: Insufficient documentation

## 2013-02-28 DIAGNOSIS — Z01812 Encounter for preprocedural laboratory examination: Secondary | ICD-10-CM | POA: Insufficient documentation

## 2013-02-28 DIAGNOSIS — M5144 Schmorl's nodes, thoracic region: Secondary | ICD-10-CM | POA: Insufficient documentation

## 2013-02-28 HISTORY — DX: Unspecified osteoarthritis, unspecified site: M19.90

## 2013-02-28 HISTORY — DX: Headache: R51

## 2013-02-28 LAB — CBC
MCH: 30.9 pg (ref 26.0–34.0)
MCV: 91 fL (ref 78.0–100.0)
Platelets: 298 10*3/uL (ref 150–400)
RDW: 12.2 % (ref 11.5–15.5)
WBC: 11.1 10*3/uL — ABNORMAL HIGH (ref 4.0–10.5)

## 2013-02-28 LAB — URINE MICROSCOPIC-ADD ON

## 2013-02-28 LAB — COMPREHENSIVE METABOLIC PANEL
ALT: 12 U/L (ref 0–35)
AST: 15 U/L (ref 0–37)
Albumin: 3.8 g/dL (ref 3.5–5.2)
Alkaline Phosphatase: 49 U/L (ref 39–117)
BUN: 9 mg/dL (ref 6–23)
CO2: 31 mEq/L (ref 19–32)
Calcium: 9.3 mg/dL (ref 8.4–10.5)
Chloride: 101 mEq/L (ref 96–112)
Creatinine, Ser: 0.73 mg/dL (ref 0.50–1.10)
GFR calc Af Amer: >90 mL/min (ref >90)
GFR calc non Af Amer: >90 mL/min (ref >90)
Glucose, Bld: 94 mg/dL (ref 70–99)
Potassium: 4.3 mEq/L (ref 3.5–5.1)
Sodium: 137 mEq/L (ref 135–145)
Total Bilirubin: 0.3 mg/dL (ref 0.3–1.2)
Total Protein: 6.7 g/dL (ref 6.0–8.3)

## 2013-02-28 LAB — URINALYSIS, ROUTINE W REFLEX MICROSCOPIC
Glucose, UA: NEGATIVE mg/dL
Ketones, ur: NEGATIVE mg/dL
Leukocytes, UA: NEGATIVE
Nitrite: NEGATIVE
Protein, ur: NEGATIVE mg/dL
Specific Gravity, Urine: 1.033 — ABNORMAL HIGH (ref 1.005–1.030)
Urobilinogen, UA: 0.2 mg/dL (ref 0.0–1.0)
pH: 5 (ref 5.0–8.0)

## 2013-02-28 LAB — APTT: aPTT: 39 seconds — ABNORMAL HIGH (ref 24–37)

## 2013-02-28 LAB — PROTIME-INR
INR: 0.93 (ref 0.00–1.49)
Prothrombin Time: 12.4 seconds (ref 11.6–15.2)

## 2013-02-28 NOTE — H&P (Signed)
Abigail [name] is an 47 y.o. female.   Chief Complaint: back pain HPI: Abigail [name] presents with the chief complaint of low back pain. She has a history of a lumbar laminectomy and microdiscectomy at L5-S1 in October of 2013. She initially did well after surgery. The past couple months after returning to work she has had increased pain in the loew back in addition to numbness, tingling, and pain in the right leg. She has lifting involved in her job. She does not recall a specific injury. 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 She reports that since this she has had increased pain and numbness. She has had no change in bladder or bowel function. MRI was ordered of the lumbar spine which showed a large recurrent disc at L5-S1 on the right.    Past Medical History  Diagnosis Date  . Hypertension   . Complication of anesthesia     headache when come out of anesthesia  . Headache     occ. migraines, not frequent  . Arthritis     arthritis-back, weakness in right leg    Past Surgical History  Procedure Laterality Date  . Left foot bone spur surgery  10 yrs ago  . Abdominal hysterectomy  14 yrs ago  . Lumbar laminectomy/decompression microdiscectomy  07/13/2012    Procedure: LUMBAR LAMINECTOMY/DECOMPRESSION MICRODISCECTOMY 1 LEVEL;  Surgeon: Jacki Cones, MD;  Location: [facility];  Service: Orthopedics;  Laterality: N/A;  Central Decompression Lumbar Laminectomy L5 - S1 (X-Ray)    Social History:  reports that she has been smoking.  She has never used smokeless tobacco. She reports that  drinks alcohol. She reports that she does not use illicit drugs.  Allergies: No Known Allergies   Current outpatient prescriptions: ALPRAZolam (XANAX) 0.5 MG tablet, Take 0.5 mg by mouth 4 (four) times daily as needed for anxiety. , Disp: , Rfl: ;  amLODipine-olmesartan (AZOR) 5-20 MG per tablet, Take 1 tablet by mouth daily before  breakfast., Disp: , Rfl: ;  aspirin EC 81 MG tablet, Take 81 mg by mouth daily., Disp: , Rfl: ;  gabapentin (NEURONTIN) 300 MG capsule, Take 300 mg by mouth 3 (three) times daily., Disp: , Rfl:  HYDROcodone-acetaminophen (NORCO/VICODIN) 5-325 MG per tablet, Take 1 tablet by mouth 2 (two) times daily as needed for pain., Disp: , Rfl: ;  meloxicam (MOBIC) 15 MG tablet, Take 15 mg by mouth daily., Disp: , Rfl: ;  methocarbamol (ROBAXIN) 500 MG tablet, Take 500 mg by mouth 3 (three) times daily as needed (for muscle spams)., Disp: , Rfl:  metoprolol succinate (TOPROL-XL) 50 MG 24 hr tablet, Take 25 mg by mouth 2 (two) times daily. Take with or immediately following a meal., Disp: , Rfl: ;  PARoxetine (PAXIL) 20 MG tablet, Take 20 mg by mouth every morning., Disp: , Rfl: ;  traMADol (ULTRAM) 50 MG tablet, Take 50 mg by mouth every 6 (six) hours as needed for pain., Disp: , Rfl:  traMADol-acetaminophen (ULTRACET) 37.5-325 MG per tablet, Take 1 tablet by mouth every 6 (six) hours as needed for pain., Disp: , Rfl:   Results for orders placed during the hospital encounter of 02/28/13 (from the past 48 hour(s))  SURGICAL PCR SCREEN     Status: None   Collection Time    02/28/13  8:35 AM      Result Value Range   MRSA, PCR NEGATIVE  NEGATIVE   Staphylococcus aureus NEGATIVE  NEGATIVE   Comment:            The Xpert SA Assay (FDA     approved for NASAL specimens     in patients over 60 years of age),     is one component of     a comprehensive surveillance     program.  Test performance has     been validated by [employer] for patients greater     than or equal to 14 year old.     It is not intended     to diagnose infection nor to     guide or monitor treatment.  URINALYSIS, ROUTINE W REFLEX MICROSCOPIC     Status: Abnormal   Collection Time    02/28/13  9:00 AM      Result Value Range   Color, Urine Avabella Wailes (*) YELLOW   Comment: BIOCHEMICALS MAY BE AFFECTED BY COLOR   APPearance CLOUDY (*)  CLEAR   Specific Gravity, Urine 1.033 (*) 1.005 - 1.030   pH 5.0  5.0 - 8.0   Glucose, UA NEGATIVE  NEGATIVE mg/dL   Hgb urine dipstick MODERATE (*) NEGATIVE   Bilirubin Urine SMALL (*) NEGATIVE   Ketones, ur NEGATIVE  NEGATIVE mg/dL   Protein, ur NEGATIVE  NEGATIVE mg/dL   Urobilinogen, UA 0.2  0.0 - 1.0 mg/dL   Nitrite NEGATIVE  NEGATIVE   Leukocytes, UA NEGATIVE  NEGATIVE  URINE MICROSCOPIC-ADD ON     Status: Abnormal   Collection Time    02/28/13  9:00 AM      Result Value Range   Squamous Epithelial / LPF RARE  RARE   WBC, UA 0-2  <3 WBC/hpf   RBC / HPF 3-6  <3 RBC/hpf   Bacteria, UA FEW (*) RARE   Urine-Other MUCOUS PRESENT    CBC     Status: Abnormal   Collection Time    02/28/13  9:10 AM      Result Value Range   WBC 11.1 (*) 4.0 - 10.5 K/uL   RBC 5.02  3.87 - 5.11 MIL/uL   Hemoglobin 15.5 (*) 12.0 - 15.0 g/dL   HCT 98.1  19.1 - 47.8 %   MCV 91.0  78.0 - 100.0 fL   MCH 30.9  26.0 - 34.0 pg   MCHC 33.9  30.0 - 36.0 g/dL   RDW 29.5  62.1 - 30.8 %   Platelets 298  150 - 400 K/uL  APTT     Status: Abnormal   Collection Time    02/28/13  9:10 AM      Result Value Range   aPTT 39 (*) 24 - 37 seconds   Comment:            IF BASELINE aPTT IS ELEVATED,     SUGGEST PATIENT RISK ASSESSMENT     BE USED TO DETERMINE APPROPRIATE     ANTICOAGULANT THERAPY.  COMPREHENSIVE METABOLIC PANEL     Status: None   Collection Time    02/28/13  9:10 AM      Result Value Range   Sodium 137  135 - 145 mEq/L   Potassium 4.3  3.5 - 5.1 mEq/L   Chloride 101  96 - 112 mEq/L   CO2 31  19 - 32 mEq/L   Glucose, Bld 94  70 - 99 mg/dL   BUN 9  6 - 23 mg/dL   Creatinine, Ser 6.57  0.50 - 1.10 mg/dL   Calcium 9.3  8.4 - 16.1 mg/dL   Total Protein 6.7  6.0 - 8.3 g/dL   Albumin 3.8  3.5 - 5.2 g/dL   AST 15  0 - 37 U/L   ALT 12  0 - 35 U/L   Alkaline Phosphatase 49  39 - 117 U/L   Total Bilirubin 0.3  0.3 - 1.2 mg/dL   GFR calc non Af Amer >90  >90 mL/min   GFR calc Af Amer >90  >90  mL/min   Comment:            The eGFR has been calculated     using the CKD EPI equation.     This calculation has not been     validated in all clinical     situations.     eGFR's persistently     <90 mL/min signify     possible Chronic Kidney Disease.  PROTIME-INR     Status: None   Collection Time    02/28/13  9:10 AM      Result Value Range   Prothrombin Time 12.4  11.6 - 15.2 seconds   INR 0.93  0.00 - 1.49   Dg Lumbar Spine 2-3 Views  02/28/2013   *RADIOLOGY REPORT*  Clinical Data: Preop lumbar surgery.  LUMBAR SPINE - 2-3 VIEW  Comparison: 07/13/2012 07/07/2012,  Findings: Moderate L5-S1 disc degeneration and disc space narrowing.  Minimal Schmorl's node deformities superior plate of W96.  Normal alignment.  IMPRESSION: Moderate L5-S1 disc space narrowing.   Original Report Authenticated By: Lacy Duverney, M.D.    Review of Systems  Constitutional: Negative for fever, chills, weight loss, malaise/fatigue and diaphoresis.  HENT: Positive for neck pain.   Eyes: Negative.   Respiratory: Negative.   Cardiovascular: Negative.   Gastrointestinal: Negative.   Genitourinary: Negative.   Musculoskeletal: Positive for back pain. Negative for myalgias, joint pain and falls.  Skin: Negative.   Neurological: Positive for tingling and weakness. Negative for dizziness, tremors, speech change, focal weakness, seizures and loss of consciousness.  Endo/Heme/Allergies: Negative.   Psychiatric/Behavioral: Negative.    Vitals Weight: 133 lb Height: 65 in Body Surface Area: 1.66 m Body Mass Index: 22.13 kg/m Pulse: 68 (Regular) BP: 108/71 (Sitting, Left Arm, Standard)  Physical Exam  Constitutional: She is oriented to person, place, and time. She appears well-developed and well-nourished. No distress.  HENT:  Head: Normocephalic and atraumatic.  Right Ear: External ear normal.  Left Ear: External ear normal.  Nose: Nose normal.  Mouth/Throat: Oropharynx is clear and  moist.  Eyes: Conjunctivae and EOM are normal. Right eye exhibits no discharge. Left eye exhibits no discharge.  Neck: Normal range of motion. Neck supple. No tracheal deviation present. No thyromegaly present.  Cardiovascular: Normal rate, regular rhythm, normal heart sounds and intact distal pulses.   No murmur heard. Respiratory: Effort normal and breath sounds normal. No respiratory distress. She has no wheezes. She exhibits no tenderness.  GI: Soft. Bowel sounds are normal. She exhibits no distension and no mass. There is no tenderness.  Musculoskeletal:       Right hip: Normal.       Left hip: Normal.       Right knee: Normal.       Left knee: Normal.       Lumbar back: She exhibits decreased range of motion, tenderness and pain.       Right lower leg: She exhibits no tenderness and no swelling.  Left lower leg: She exhibits no tenderness and no swelling.  Lymphadenopathy:    She has no cervical adenopathy.  Neurological: She is alert and oriented to person, place, and time. She has normal reflexes.  She does have decreased sensation along the right anterior thigh. She does also have decreased strength in the dorsiflexors of the right foot, 3/5.  Skin: No rash noted. She is not diaphoretic. No erythema.  Psychiatric: She has a normal mood and affect. Her behavior is normal.     Assessment/Plan Lumbar disc herniation L5-S1 right She has a large recurrent disc herniation at L5-S1 on the right. She needs a lumbar laminectomy and microdiscectomy at L5-S1 on the right. Risks and benefits of the surgery discussed with the patient by Dr. Darrelyn Hillock.   Ceola Para LAUREN 02/28/2013, 2:43 PM

## 2013-02-28 NOTE — Progress Notes (Signed)
02-28-13 1700 labs viewable in  Epic, note urinalysis result.

## 2013-02-28 NOTE — Patient Instructions (Addendum)
20 Abigail [name]  02/28/2013   Your procedure is scheduled on:   03-07-2013  Report to [facility] at        0530 AM.  Call this number if you have problems the morning of surgery: [phone]  Or Presurgical Testing [phone](Adilyn Humes)     Do not eat food:After Midnight.    Take these medicines the morning of surgery with A SIP OF WATER: Alprazolam. Metoprolol. Gabapentin. Hydrocodone. Paroxetine. Reminder: Amlodipine 5mg  orally will be given by Short Stay on arrival(donot take AZOR).   Do not wear jewelry, make-up or nail polish.  Do not wear lotions, powders, or perfumes. You may wear deodorant.  Do not shave 12 hours prior to first CHG shower(legs and under arms).(face and neck okay.)  Do not bring valuables to the hospital.  Contacts, dentures or bridgework,body piercing,  may not be worn into surgery.  Leave suitcase in the car. After surgery it may be brought to your room.  For patients admitted to the hospital, checkout time is 11:00 AM the day of discharge.   Patients discharged the day of surgery will not be allowed to drive home. Must have responsible person with you x 24 hours once discharged.  Name and phone number of your driver: Ramie Palladino, spouse [phone] cell  Special Instructions: CHG(Chlorhedine 4%-"Hibiclens","Betasept","Aplicare") Shower Use Special Wash: see special instructions.(avoid face and genitals)   Please read over the following fact sheets that you were given: MRSA Information., Incentive Spirometry Instruction.    Failure to follow these instructions may result in Cancellation of your surgery.   Patient signature_______________________________________________________

## 2013-02-28 NOTE — Pre-Procedure Instructions (Addendum)
02-28-13 EKG done today. Echo 40'98 -report with chart. CXR 9'13 - report with chart. Lumbar spine XR done today. 02-28-13 1700 labs viewable in Epic-faxed to Dr. Jeannetta Ellis office.

## 2013-03-07 ENCOUNTER — Ambulatory Visit (HOSPITAL_COMMUNITY): Payer: 59 | Admitting: *Deleted

## 2013-03-07 ENCOUNTER — Observation Stay (HOSPITAL_COMMUNITY)
Admission: RE | Admit: 2013-03-07 | Discharge: 2013-03-08 | Disposition: A | Discharge status: Home / Self Care | Payer: 59 | Source: Ambulatory Visit | Attending: Orthopedic Surgery | Admitting: Orthopedic Surgery

## 2013-03-07 ENCOUNTER — Encounter (HOSPITAL_COMMUNITY): Payer: Self-pay | Admitting: *Deleted

## 2013-03-07 ENCOUNTER — Ambulatory Visit (HOSPITAL_COMMUNITY): Payer: 59

## 2013-03-07 ENCOUNTER — Encounter (HOSPITAL_COMMUNITY)
Admission: RE | Disposition: A | Discharge status: Home / Self Care | Payer: Self-pay | Source: Ambulatory Visit | Attending: Orthopedic Surgery

## 2013-03-07 DIAGNOSIS — G8929 Other chronic pain: Secondary | ICD-10-CM | POA: Diagnosis present

## 2013-03-07 DIAGNOSIS — I10 Essential (primary) hypertension: Secondary | ICD-10-CM | POA: Insufficient documentation

## 2013-03-07 DIAGNOSIS — M48062 Spinal stenosis, lumbar region with neurogenic claudication: Secondary | ICD-10-CM | POA: Diagnosis present

## 2013-03-07 DIAGNOSIS — F17200 Nicotine dependence, unspecified, uncomplicated: Secondary | ICD-10-CM | POA: Insufficient documentation

## 2013-03-07 DIAGNOSIS — M5116 Intervertebral disc disorders with radiculopathy, lumbar region: Secondary | ICD-10-CM | POA: Diagnosis present

## 2013-03-07 DIAGNOSIS — Z79899 Other long term (current) drug therapy: Secondary | ICD-10-CM | POA: Insufficient documentation

## 2013-03-07 DIAGNOSIS — M5126 Other intervertebral disc displacement, lumbar region: Principal | ICD-10-CM | POA: Insufficient documentation

## 2013-03-07 HISTORY — PX: LUMBAR LAMINECTOMY/DECOMPRESSION MICRODISCECTOMY: SHX5026

## 2013-03-07 SURGERY — LUMBAR LAMINECTOMY/DECOMPRESSION MICRODISCECTOMY
Anesthesia: General | Site: Back | Laterality: Right | Wound class: Clean

## 2013-03-07 MED ORDER — IRBESARTAN 150 MG PO TABS
150.0000 mg | ORAL_TABLET | Freq: Every day | ORAL | Status: DC
  Administered 2013-03-08: 150 mg via ORAL
  Filled 2013-03-07 (×2): qty 1

## 2013-03-07 MED ORDER — ACETAMINOPHEN 650 MG RE SUPP: 650.0000 mg | RECTAL | Status: DC | PRN

## 2013-03-07 MED ORDER — METOPROLOL SUCCINATE ER 25 MG PO TB24
25.0000 mg | ORAL_TABLET | Freq: Two times a day (BID) | ORAL | Status: DC
  Administered 2013-03-08: 25 mg via ORAL
  Filled 2013-03-07 (×4): qty 1

## 2013-03-07 MED ORDER — THROMBIN 5000 UNITS EX SOLR
CUTANEOUS | Status: AC
  Filled 2013-03-07: qty 10000

## 2013-03-07 MED ORDER — SUCCINYLCHOLINE CHLORIDE 20 MG/ML IJ SOLN
INTRAMUSCULAR | Status: DC | PRN
  Administered 2013-03-07: 100 mg via INTRAVENOUS

## 2013-03-07 MED ORDER — PHENOL 1.4 % MT LIQD: 1.0000 | OROMUCOSAL | Status: DC | PRN

## 2013-03-07 MED ORDER — EPHEDRINE SULFATE 50 MG/ML IJ SOLN
INTRAMUSCULAR | Status: DC | PRN
  Administered 2013-03-07 (×5): 5 mg via INTRAVENOUS

## 2013-03-07 MED ORDER — PAROXETINE HCL 20 MG PO TABS
20.0000 mg | ORAL_TABLET | Freq: Every morning | ORAL | Status: DC
Start: 2013-03-08 — End: 2013-03-08
  Administered 2013-03-08: 20 mg via ORAL
  Filled 2013-03-07: qty 1

## 2013-03-07 MED ORDER — ALPRAZOLAM 0.5 MG PO TABS
0.5000 mg | ORAL_TABLET | Freq: Four times a day (QID) | ORAL | Status: DC | PRN
  Administered 2013-03-07 (×2): 0.5 mg via ORAL
  Filled 2013-03-07 (×2): qty 1

## 2013-03-07 MED ORDER — METHOCARBAMOL 100 MG/ML IJ SOLN
500.0000 mg | Freq: Once | INTRAVENOUS | Status: AC
  Administered 2013-03-07: 500 mg via INTRAVENOUS
  Filled 2013-03-07: qty 5

## 2013-03-07 MED ORDER — THROMBIN 5000 UNITS EX SOLR
CUTANEOUS | Status: DC | PRN
  Administered 2013-03-07: 10000 [IU] via TOPICAL

## 2013-03-07 MED ORDER — HYDROMORPHONE HCL PF 1 MG/ML IJ SOLN
INTRAMUSCULAR | Status: AC
  Filled 2013-03-07: qty 1

## 2013-03-07 MED ORDER — CEFAZOLIN SODIUM 1-5 GM-% IV SOLN
1.0000 g | Freq: Three times a day (TID) | INTRAVENOUS | Status: AC
  Administered 2013-03-07 – 2013-03-08 (×3): 1 g via INTRAVENOUS
  Filled 2013-03-07 (×3): qty 50

## 2013-03-07 MED ORDER — BUPIVACAINE LIPOSOME 1.3 % IJ SUSP
20.0000 mL | Freq: Once | INTRAMUSCULAR | Status: DC
  Filled 2013-03-07: qty 20

## 2013-03-07 MED ORDER — MEPERIDINE HCL 50 MG/ML IJ SOLN: 6.2500 mg | INTRAMUSCULAR | Status: DC | PRN

## 2013-03-07 MED ORDER — FENTANYL CITRATE 0.05 MG/ML IJ SOLN
INTRAMUSCULAR | Status: DC | PRN
  Administered 2013-03-07 (×5): 50 ug via INTRAVENOUS

## 2013-03-07 MED ORDER — METHOCARBAMOL 500 MG PO TABS: 500.0000 mg | ORAL_TABLET | Freq: Four times a day (QID) | ORAL | Status: DC | PRN

## 2013-03-07 MED ORDER — AMLODIPINE BESYLATE 5 MG PO TABS
5.0000 mg | ORAL_TABLET | Freq: Every day | ORAL | Status: DC
  Administered 2013-03-07: 5 mg via ORAL
  Filled 2013-03-07: qty 1

## 2013-03-07 MED ORDER — METHOCARBAMOL 100 MG/ML IJ SOLN
500.0000 mg | Freq: Four times a day (QID) | INTRAVENOUS | Status: DC | PRN
  Administered 2013-03-07: 500 mg via INTRAVENOUS
  Filled 2013-03-07 (×2): qty 5

## 2013-03-07 MED ORDER — HYDROMORPHONE HCL PF 1 MG/ML IJ SOLN
0.5000 mg | INTRAMUSCULAR | Status: DC | PRN
  Administered 2013-03-07: 0.5 mg via INTRAVENOUS
  Administered 2013-03-07 – 2013-03-08 (×3): 1 mg via INTRAVENOUS
  Filled 2013-03-07 (×3): qty 1

## 2013-03-07 MED ORDER — ACETAMINOPHEN 10 MG/ML IV SOLN
INTRAVENOUS | Status: AC
  Filled 2013-03-07: qty 100

## 2013-03-07 MED ORDER — BUPIVACAINE LIPOSOME 1.3 % IJ SUSP
INTRAMUSCULAR | Status: DC | PRN
  Administered 2013-03-07: 20 mL

## 2013-03-07 MED ORDER — BACITRACIN-NEOMYCIN-POLYMYXIN 400-5-5000 EX OINT
TOPICAL_OINTMENT | CUTANEOUS | Status: DC | PRN
  Administered 2013-03-07: 1 via TOPICAL

## 2013-03-07 MED ORDER — DEXAMETHASONE SODIUM PHOSPHATE 10 MG/ML IJ SOLN
INTRAMUSCULAR | Status: DC | PRN
  Administered 2013-03-07: 10 mg via INTRAVENOUS

## 2013-03-07 MED ORDER — LACTATED RINGERS IV SOLN
INTRAVENOUS | Status: DC
  Administered 2013-03-07 (×3): via INTRAVENOUS

## 2013-03-07 MED ORDER — ONDANSETRON HCL 4 MG/2ML IJ SOLN
INTRAMUSCULAR | Status: DC | PRN
  Administered 2013-03-07 (×2): 2 mg via INTRAVENOUS

## 2013-03-07 MED ORDER — ONDANSETRON HCL 4 MG/2ML IJ SOLN: 4.0000 mg | INTRAMUSCULAR | Status: DC | PRN

## 2013-03-07 MED ORDER — AMLODIPINE-OLMESARTAN 5-20 MG PO TABS: 1.0000 | ORAL_TABLET | Freq: Every day | ORAL | Status: DC

## 2013-03-07 MED ORDER — GABAPENTIN 300 MG PO CAPS
300.0000 mg | ORAL_CAPSULE | Freq: Three times a day (TID) | ORAL | Status: DC
  Administered 2013-03-07 – 2013-03-08 (×3): 300 mg via ORAL
  Filled 2013-03-07 (×7): qty 1

## 2013-03-07 MED ORDER — NEOSTIGMINE METHYLSULFATE 1 MG/ML IJ SOLN
INTRAMUSCULAR | Status: DC | PRN
  Administered 2013-03-07: 1 mg via INTRAVENOUS

## 2013-03-07 MED ORDER — AMLODIPINE BESYLATE 5 MG PO TABS
5.0000 mg | ORAL_TABLET | Freq: Every day | ORAL | Status: DC
  Administered 2013-03-08: 5 mg via ORAL
  Filled 2013-03-07 (×2): qty 1

## 2013-03-07 MED ORDER — CISATRACURIUM BESYLATE (PF) 10 MG/5ML IV SOLN
INTRAVENOUS | Status: DC | PRN
  Administered 2013-03-07: 2 mg via INTRAVENOUS
  Administered 2013-03-07: 5 mg via INTRAVENOUS

## 2013-03-07 MED ORDER — OXYCODONE HCL 5 MG PO TABS: 5.0000 mg | ORAL_TABLET | Freq: Once | ORAL | Status: DC | PRN

## 2013-03-07 MED ORDER — LIDOCAINE HCL (CARDIAC) 20 MG/ML IV SOLN
INTRAVENOUS | Status: DC | PRN
  Administered 2013-03-07: 75 mg via INTRAVENOUS

## 2013-03-07 MED ORDER — MENTHOL 3 MG MT LOZG: 1.0000 | LOZENGE | OROMUCOSAL | Status: DC | PRN

## 2013-03-07 MED ORDER — FLEET ENEMA 7-19 GM/118ML RE ENEM: 1.0000 | ENEMA | Freq: Once | RECTAL | Status: AC | PRN

## 2013-03-07 MED ORDER — HYDROCODONE-ACETAMINOPHEN 5-325 MG PO TABS
1.0000 | ORAL_TABLET | ORAL | Status: DC | PRN
  Administered 2013-03-07 – 2013-03-08 (×4): 2 via ORAL
  Filled 2013-03-07 (×4): qty 2

## 2013-03-07 MED ORDER — HYDROMORPHONE HCL PF 1 MG/ML IJ SOLN
0.2500 mg | INTRAMUSCULAR | Status: DC | PRN
  Administered 2013-03-07 (×4): 0.5 mg via INTRAVENOUS

## 2013-03-07 MED ORDER — CEFAZOLIN SODIUM-DEXTROSE 2-3 GM-% IV SOLR
2.0000 g | INTRAVENOUS | Status: AC
  Administered 2013-03-07: 2 g via INTRAVENOUS

## 2013-03-07 MED ORDER — CEFAZOLIN SODIUM-DEXTROSE 2-3 GM-% IV SOLR
INTRAVENOUS | Status: AC
  Filled 2013-03-07: qty 50

## 2013-03-07 MED ORDER — SODIUM CHLORIDE 0.9 % IR SOLN
Status: DC | PRN
  Administered 2013-03-07: 08:00:00

## 2013-03-07 MED ORDER — ACETAMINOPHEN 10 MG/ML IV SOLN: 1000.0000 mg | Freq: Once | INTRAVENOUS | Status: DC | PRN

## 2013-03-07 MED ORDER — PROPOFOL 10 MG/ML IV BOLUS
INTRAVENOUS | Status: DC | PRN
  Administered 2013-03-07: 175 mg via INTRAVENOUS

## 2013-03-07 MED ORDER — LACTATED RINGERS IV SOLN
INTRAVENOUS | Status: DC
  Administered 2013-03-07 (×2): via INTRAVENOUS

## 2013-03-07 MED ORDER — OXYCODONE HCL 5 MG/5ML PO SOLN: 5.0000 mg | Freq: Once | ORAL | Status: DC | PRN

## 2013-03-07 MED ORDER — PROMETHAZINE HCL 25 MG/ML IJ SOLN: 6.2500 mg | INTRAMUSCULAR | Status: DC | PRN

## 2013-03-07 MED ORDER — POLYETHYLENE GLYCOL 3350 17 G PO PACK
17.0000 g | PACK | Freq: Every day | ORAL | Status: DC | PRN
  Filled 2013-03-07: qty 1

## 2013-03-07 MED ORDER — GLYCOPYRROLATE 0.2 MG/ML IJ SOLN
INTRAMUSCULAR | Status: DC | PRN
  Administered 2013-03-07: .2 mg via INTRAVENOUS

## 2013-03-07 MED ORDER — BACITRACIN-NEOMYCIN-POLYMYXIN 400-5-5000 EX OINT
TOPICAL_OINTMENT | CUTANEOUS | Status: AC
  Filled 2013-03-07: qty 1

## 2013-03-07 MED ORDER — OXYCODONE-ACETAMINOPHEN 5-325 MG PO TABS: 1.0000 | ORAL_TABLET | ORAL | Status: DC | PRN

## 2013-03-07 MED ORDER — ACETAMINOPHEN 10 MG/ML IV SOLN
INTRAVENOUS | Status: DC | PRN
  Administered 2013-03-07: 1000 mg via INTRAVENOUS

## 2013-03-07 MED ORDER — MIDAZOLAM HCL 5 MG/5ML IJ SOLN
INTRAMUSCULAR | Status: DC | PRN
  Administered 2013-03-07: 1 mg via INTRAVENOUS
  Administered 2013-03-07: .5 mg via INTRAVENOUS

## 2013-03-07 MED ORDER — ACETAMINOPHEN 325 MG PO TABS: 650.0000 mg | ORAL_TABLET | ORAL | Status: DC | PRN

## 2013-03-07 MED ORDER — BISACODYL 10 MG RE SUPP: 10.0000 mg | Freq: Every day | RECTAL | Status: DC | PRN

## 2013-03-07 SURGICAL SUPPLY — 44 items
BAG ZIPLOCK 12X15 (MISCELLANEOUS) ×2 IMPLANT
BENZOIN TINCTURE PRP APPL 2/3 (GAUZE/BANDAGES/DRESSINGS) ×2 IMPLANT
CLEANER TIP ELECTROSURG 2X2 (MISCELLANEOUS) ×2 IMPLANT
CLOTH BEACON ORANGE TIMEOUT ST (SAFETY) ×2 IMPLANT
CONT SPEC 4OZ CLIKSEAL STRL BL (MISCELLANEOUS) ×2 IMPLANT
CONT SPECI 4OZ STER CLIK (MISCELLANEOUS) ×2 IMPLANT
DRAIN PENROSE 18X1/4 LTX STRL (WOUND CARE) IMPLANT
DRAPE MICROSCOPE LEICA (MISCELLANEOUS) ×2 IMPLANT
DRAPE POUCH INSTRU U-SHP 10X18 (DRAPES) ×2 IMPLANT
DRAPE SURG 17X11 SM STRL (DRAPES) ×2 IMPLANT
DRSG ADAPTIC 3X8 NADH LF (GAUZE/BANDAGES/DRESSINGS) ×2 IMPLANT
DRSG PAD ABDOMINAL 8X10 ST (GAUZE/BANDAGES/DRESSINGS) ×6 IMPLANT
DURAPREP 26ML APPLICATOR (WOUND CARE) ×2 IMPLANT
ELECT BLADE TIP CTD 4 INCH (ELECTRODE) ×2 IMPLANT
ELECT REM PT RETURN 9FT ADLT (ELECTROSURGICAL) ×2
ELECTRODE REM PT RTRN 9FT ADLT (ELECTROSURGICAL) ×1 IMPLANT
GLOVE BIOGEL PI IND STRL 8 (GLOVE) ×2 IMPLANT
GLOVE BIOGEL PI INDICATOR 8 (GLOVE) ×2
GLOVE ECLIPSE 8.0 STRL XLNG CF (GLOVE) ×4 IMPLANT
GOWN PREVENTION PLUS LG XLONG (DISPOSABLE) ×4 IMPLANT
GOWN STRL REIN XL XLG (GOWN DISPOSABLE) ×4 IMPLANT
KIT BASIN OR (CUSTOM PROCEDURE TRAY) ×2 IMPLANT
KIT POSITIONING SURG ANDREWS (MISCELLANEOUS) ×2 IMPLANT
MANIFOLD NEPTUNE II (INSTRUMENTS) ×2 IMPLANT
NEEDLE SPNL 18GX3.5 QUINCKE PK (NEEDLE) ×6 IMPLANT
NS IRRIG 1000ML POUR BTL (IV SOLUTION) IMPLANT
PATTIES SURGICAL .5 X.5 (GAUZE/BANDAGES/DRESSINGS) IMPLANT
PATTIES SURGICAL .75X.75 (GAUZE/BANDAGES/DRESSINGS) IMPLANT
PATTIES SURGICAL 1X1 (DISPOSABLE) IMPLANT
PIN SAFETY NICK PLATE  2 MED (MISCELLANEOUS)
PIN SAFETY NICK PLATE 2 MED (MISCELLANEOUS) IMPLANT
POSITIONER SURGICAL ARM (MISCELLANEOUS) ×2 IMPLANT
SPONGE GAUZE 4X4 12PLY (GAUZE/BANDAGES/DRESSINGS) ×2 IMPLANT
SPONGE LAP 4X18 X RAY DECT (DISPOSABLE) IMPLANT
SPONGE SURGIFOAM ABS GEL 100 (HEMOSTASIS) ×2 IMPLANT
STAPLER VISISTAT 35W (STAPLE) IMPLANT
SUT VIC AB 0 CT1 27 (SUTURE) ×1
SUT VIC AB 0 CT1 27XBRD ANTBC (SUTURE) ×1 IMPLANT
SUT VIC AB 1 CT1 27 (SUTURE) ×4
SUT VIC AB 1 CT1 27XBRD ANTBC (SUTURE) ×4 IMPLANT
TAPE CLOTH SURG 4X10 WHT LF (GAUZE/BANDAGES/DRESSINGS) ×2 IMPLANT
TOWEL OR 17X26 10 PK STRL BLUE (TOWEL DISPOSABLE) ×4 IMPLANT
TRAY LAMINECTOMY (CUSTOM PROCEDURE TRAY) ×2 IMPLANT
WATER STERILE IRR 1500ML POUR (IV SOLUTION) IMPLANT

## 2013-03-07 NOTE — Interval H&P Note (Signed)
History and Physical Interval Note:  03/07/2013 7:22 AM  Abigail [name]  has presented today for surgery, with the diagnosis of HERNIATED DISC   The various methods of treatment have been discussed with the patient and family. After consideration of risks, benefits and other options for treatment, the patient has consented to  Procedure(s): LUMBAR DECOMPRESSIVE HEMI LAMINECTOMY AND  MICRODISCECTOMY  L5-S1 RIGHT  (Right) as a surgical intervention .  The patient's history has been reviewed, patient examined, no change in status, stable for surgery.  I have reviewed the patient's chart and labs.  Questions were answered to the patient's satisfaction.     [name]

## 2013-03-07 NOTE — Transfer of Care (Signed)
Immediate Anesthesia Transfer of Care Note  Patient: Sala L Greenawalt  Procedure(s) Performed: Procedure(s): LUMBAR DECOMPRESSIVE HEMI LAMINECTOMY AND  MICRODISCECTOMY  L5-S1 RIGHT  (Right)  Patient Location: PACU  Anesthesia Type:General  Level of Consciousness: awake, alert , oriented and patient cooperative  Airway & Oxygen Therapy: Patient Spontanous Breathing and Patient connected to face mask oxygen  Post-op Assessment: Report given to PACU RN, Post -op Vital signs reviewed and stable and Patient moving all extremities  Post vital signs: Reviewed and stable  Complications: No apparent anesthesia complications 

## 2013-03-07 NOTE — Anesthesia Postprocedure Evaluation (Signed)
Anesthesia Post Note  Patient: Abigail [name]  Procedure(s) Performed: Procedure(s) (LRB): LUMBAR DECOMPRESSIVE HEMI LAMINECTOMY AND  MICRODISCECTOMY  L5-S1 RIGHT  (Right)  Anesthesia type: General  Patient location: PACU  Post pain: Pain level controlled  Post assessment: Post-op Vital signs reviewed  Last Vitals: BP 98/61  Pulse 64  Temp(Src) 36.7 C (Oral)  Resp 14  SpO2 100%  Post vital signs: Reviewed  Level of consciousness: sedated  Complications: No apparent anesthesia complications

## 2013-03-07 NOTE — Brief Op Note (Signed)
03/07/2013  9:17 AM  PATIENT:  Abigail [name]  47 y.o. female  PRE-OPERATIVE DIAGNOSIS:  HERNIATED DISC,RECURRENT at L-5-S-1 on the Right.Spinal Stenosis L-5-S-1   POST-OPERATIVE DIAGNOSIS:  HERNIATED DISC,RECURRENT at L-5-S-1 on the Right.Spinal Stenosis L-5-S-1 with Foraminal Stenosis of L-5 and S-1 roots,on the Right.  PROCEDURE:  Procedure(s): LUMBAR DECOMPRESSIVE HEMI LAMINECTOMY AND  MICRODISCECTOMY  L5-S1 RIGHT  (Right) for RECURRENT HNP and decompressive Lumbar Laminectomy for Spinal Stenosis L-5-S-1 and Foraminotomies for L-5 and S-1 roots on the Right.  SURGEON:  Surgeon(s) and Role:    * Jacki Cones, MD - Primary    * Javier Docker, MD - Assisting    ASSISTANTS:Jeffrey Beane MD   ANESTHESIA:   general  EBL:  Total I/O In: 1600 [I.V.:1600] Out: -   BLOOD ADMINISTERED:none  DRAINS: none   LOCAL MEDICATIONS USED:  BUPIVICAINE 20cc.  SPECIMEN:  Source of Specimen:  L-5-S-1   DISPOSITION OF SPECIMEN:  PATHOLOGY  COUNTS:  YES  TOURNIQUET:  * No tourniquets in log *  DICTATION: .Other Dictation: Dictation Number [id number]  PLAN OF CARE: Admit for overnight observation  PATIENT DISPOSITION:  PACU - hemodynamically stable.   Delay start of Pharmacological VTE agent (>24hrs) due to surgical blood loss or risk of bleeding: yes

## 2013-03-07 NOTE — Preoperative (Signed)
Beta Blockers   Reason not to administer Beta Blockers:Not Applicable got beta blocker this am

## 2013-03-07 NOTE — Plan of Care (Signed)
Problem: Phase I Progression Outcomes Goal: Log roll for position change Outcome: Completed/Met Date Met:  03/07/13 Patient educated on log roll

## 2013-03-07 NOTE — Transfer of Care (Signed)
Immediate Anesthesia Transfer of Care Note  Patient: Abigail [name]  Procedure(s) Performed: Procedure(s): LUMBAR DECOMPRESSIVE HEMI LAMINECTOMY AND  MICRODISCECTOMY  L5-S1 RIGHT  (Right)  Patient Location: PACU  Anesthesia Type:General  Level of Consciousness: awake, alert , oriented and patient cooperative  Airway & Oxygen Therapy: Patient Spontanous Breathing and Patient connected to face mask oxygen  Post-op Assessment: Report given to PACU RN, Post -op Vital signs reviewed and stable and Patient moving all extremities  Post vital signs: Reviewed and stable  Complications: No apparent anesthesia complications

## 2013-03-07 NOTE — Anesthesia Preprocedure Evaluation (Signed)
Anesthesia Evaluation  Patient identified by MRN, date of birth, ID band Patient awake    Reviewed: Allergy & Precautions, H&P , NPO status , Patient's Chart, lab work & pertinent test results, reviewed documented beta blocker date and time   History of Anesthesia Complications Negative for: history of anesthetic complications  Airway Mallampati: II TM Distance: >3 FB Neck ROM: Full    Dental  (+) Dental Advisory Given and Teeth Intact   Pulmonary neg pulmonary ROS,  breath sounds clear to auscultation  Pulmonary exam normal       Cardiovascular hypertension, Pt. on medications and Pt. on home beta blockers - CAD, - Past MI and - CHF Rhythm:Regular Rate:Normal     Neuro/Psych  Headaches, negative psych ROS   GI/Hepatic negative GI ROS, Neg liver ROS,   Endo/Other  negative endocrine ROS  Renal/GU negative Renal ROS     Musculoskeletal   Abdominal   Peds  Hematology negative hematology ROS (+)   Anesthesia Other Findings   Reproductive/Obstetrics                           Anesthesia Physical  Anesthesia Plan  ASA: II  Anesthesia Plan: General   Post-op Pain Management:    Induction: Intravenous  Airway Management Planned: Oral ETT  Additional Equipment:   Intra-op Plan:   Post-operative Plan: Extubation in OR  Informed Consent: I have reviewed the patients History and Physical, chart, labs and discussed the procedure including the risks, benefits and alternatives for the proposed anesthesia with the patient or authorized representative who has indicated his/her understanding and acceptance.   Dental advisory given  Plan Discussed with: CRNA  Anesthesia Plan Comments:         Anesthesia Quick Evaluation

## 2013-03-08 ENCOUNTER — Encounter (HOSPITAL_COMMUNITY): Payer: Self-pay | Admitting: Orthopedic Surgery

## 2013-03-08 MED ORDER — METHOCARBAMOL 500 MG PO TABS: 500.0000 mg | ORAL_TABLET | Freq: Four times a day (QID) | ORAL | Status: DC

## 2013-03-08 MED ORDER — OXYCODONE-ACETAMINOPHEN 10-325 MG PO TABS: 1.0000 | ORAL_TABLET | ORAL | Status: DC | PRN

## 2013-03-08 NOTE — Progress Notes (Signed)
Subjective: 1 Day Post-Op Procedure(s) (LRB): LUMBAR DECOMPRESSIVE HEMI LAMINECTOMY AND  MICRODISCECTOMY  L5-S1 RIGHT  (Right) Patient reports pain as 1 on 0-10 scale.Doing very well today. Has voided. Will DC  Objective: Vital signs in last 24 hours: Temp:  [97.9 F (36.6 C)-98.4 F (36.9 C)] 98 F (36.7 C) (05/29 0615) Pulse Rate:  [60-104] 63 (05/29 0615) Resp:  [12-18] 16 (05/29 0615) BP: (94-115)/(60-75) 115/75 mmHg (05/29 0615) SpO2:  [93 %-100 %] 97 % (05/29 0615) Weight:  [62.7 kg (138 lb 3.7 oz)] 62.7 kg (138 lb 3.7 oz) (05/28 1102)  Intake/Output from previous day: 05/28 0701 - 05/29 0700 In: 4996.7 [P.O.:1320; I.V.:3571.7; IV Piggyback:105] Out: 5150 [Urine:5150] Intake/Output this shift:    No results found for this basename: HGB,  in the last 72 hours No results found for this basename: WBC, RBC, HCT, PLT,  in the last 72 hours No results found for this basename: NA, K, CL, CO2, BUN, CREATININE, GLUCOSE, CALCIUM,  in the last 72 hours No results found for this basename: LABPT, INR,  in the last 72 hours  Neurologically intact Dorsiflexion/Plantar flexion intact  Assessment/Plan: 1 Day Post-Op Procedure(s) (LRB): LUMBAR DECOMPRESSIVE HEMI LAMINECTOMY AND  MICRODISCECTOMY  L5-S1 RIGHT  (Right) Discharge home with home health  Ilani Otterson A 03/08/2013, 7:07 AM

## 2013-03-08 NOTE — Evaluation (Signed)
Occupational Therapy Evaluation Patient Details Name: Abigail [name] MRN: [id number] DOB: [birth year] Today's Date: 03/08/2013 Time: 4098-1191 OT Time Calculation (min): 13 min  OT Assessment / Plan / Recommendation Clinical Impression  This 47 year old female was admitted for recurrent L5-S1 decompression.  She had previous surgery in Oct '13.  Pt verbalizes all education.  No further OT is needed at this time.      OT Assessment  Patient does not need any further OT services    Follow Up Recommendations  No OT follow up    Barriers to Discharge      Equipment Recommendations  None recommended by OT (has all)    Recommendations for Other Services    Frequency       Precautions / Restrictions Precautions Precautions: Back Restrictions Weight Bearing Restrictions: No   Pertinent Vitals/Pain Back sore only.  States she is doing so much better this time    ADL  Transfers/Ambulation Related to ADLs: performed spt to chair at independent level.  Cued for bed mobility as she initially did not roll.  Pt verbalizes all. ADL Comments: Husband has been helping her with adls. Reviewed safe methods for her to do this herself, if she finds she needs to.  Pt verbalizes.  Reviewed bathroom transfers--especially tub sequence.  Husband assists her.  Pt verbalizes all education:  she needs mod A overall for LB adls and set up for UB    OT Diagnosis:    OT Problem List:   OT Treatment Interventions:     OT Goals    Visit Information  Last OT Received On: 03/08/13 Assistance Needed: +1    Subjective Data  Subjective: I may have to have this shoulder done next.  At least all of the numbness is gone now Patient Stated Goal: home today   Prior Functioning     Home Living Lives With: Spouse Available Help at Discharge: Family Type of Home: House Home Access: Stairs to enter Secretary/administrator of Steps: 3-4 Entrance Stairs-Rails: Left Home Layout: One level Bathroom  Shower/Tub: Engineer, manufacturing systems: Standard Home Adaptive Equipment: Bedside commode/3-in-1;Walker - four wheeled;Straight cane (uses 3:1 in tub) Prior Function Level of Independence: Needs assistance Comments: walked with cane and walker by herself Communication Communication: No difficulties         Vision/Perception     Cognition  Cognition Arousal/Alertness: Awake/alert Behavior During Therapy: WFL for tasks assessed/performed Overall Cognitive Status: Within Functional Limits for tasks assessed    Extremity/Trunk Assessment Right Upper Extremity Assessment RUE ROM/Strength/Tone: [facility] for tasks assessed Left Upper Extremity Assessment LUE ROM/Strength/Tone: WFL for tasks assessed     Mobility Bed Mobility Bed Mobility: Rolling Left;Supine to Sit Rolling Left: 7: Independent Supine to Sit: 5: Supervision Details for Bed Mobility Assistance: cues as initially pt started to lift up rather than roll over Transfers Transfers: Sit to Stand Sit to Stand: 5: Supervision     Exercise     Balance     End of Session OT - End of Session Activity Tolerance: Patient tolerated treatment well Patient left: in chair;with call bell/phone within reach  GO Functional Assessment Tool Used: clinical judgment/observation Functional Limitation: Self care Self Care Current Status (Y7829): At least 40 percent but less than 60 percent impaired, limited or restricted Self Care Goal Status (F6213): At least 40 percent but less than 60 percent impaired, limited or restricted Self Care Discharge Status [phone]): At least 40 percent but less than 60 percent impaired, limited  or restricted   Nickolaos Brallier 03/08/2013, 8:31 AM Marica Otter, OTR/L [phone] 03/08/2013

## 2013-03-08 NOTE — Discharge Summary (Signed)
Physician Discharge Summary  Patient ID: Abigail [name] MRN: [id number] DOB/AGE: 1966-04-13 47 y.o.  Admit date: 03/07/2013 Discharge date: 03/08/2013  Admission Diagnoses:Spinal Stenosis Lumbar  Discharge Diagnoses: Spinal Stenosis Lumbar and Herniated Lumbar Disc. Active Problems:   Spinal stenosis, lumbar region, with neurogenic claudication   Lumbar disc herniation with radiculopathy   Discharged Condition: improved  Hospital Course: No complications.  Consults: Physical Therapy  Significant Diagnostic Studies: Xrays in OR  Treatments: antibiotics: Ancef  Discharge Exam: Blood pressure 115/75, pulse 63, temperature 98 F (36.7 C), temperature source Oral, resp. rate 16, height 5\' 5"  (1.651 m), weight 62.7 kg (138 lb 3.7 oz), SpO2 97.00%. Extremities: extremities normal, atraumatic, no cyanosis or edema and Homans sign is negative, no sign of DVT  Disposition: 01-Home or Self Care  Discharge Orders   Future Orders Complete By Expires     Call MD / Call 911  As directed     Comments:      If you experience chest pain or shortness of breath, CALL 911 and be transported to the hospital emergency room.  If you develope a fever above 101 F, pus (white drainage) or increased drainage or redness at the wound, or calf pain, call your surgeon's office.    Constipation Prevention  As directed     Comments:      Drink plenty of fluids.  Prune juice may be helpful.  You may use a stool softener, such as Colace (over the counter) 100 mg twice a day.  Use MiraLax (over the counter) for constipation as needed.    Diet - low sodium heart healthy  As directed     Discharge instructions  As directed     Comments:      Change your dressing daily. Shower only, no tub bath. Call if any temperatures greater than 101 or any wound complications: [phone] during the day and ask for Dr. Jeannetta Ellis nurse, Mackey [place]chwood.    Driving restrictions  As directed     Comments:      No driving for one  week    Increase activity slowly as tolerated  As directed         Med List    Warning      Cannot display patient medications because the patient has not yet arrived.          Signed: Pressley Barsky A 03/08/2013, 7:12 AM

## 2013-03-08 NOTE — Op Note (Signed)
Abigail [name], Abigail [name]              ACCOUNT NO.:  [id number]  MEDICAL RECORD NO.:  [id number]  LOCATION:  1531                         FACILITY:  [facility]  PHYSICIAN:  Georges Lynch. Celeste Candelas, M.D.DATE OF BIRTH:  11-16-1965  DATE OF PROCEDURE:  03/07/2013 DATE OF DISCHARGE:                              OPERATIVE REPORT   SURGEON:  Georges Lynch. Darrelyn Hillock, M.D.  ASSISTANT:  Jene Every, M.D.  PREOPERATIVE DIAGNOSES: 1. Recurrent herniated disk at L5-S1 on the right. 2. Spinal stenosis, L5-S1. 3. Foraminal stenosis of the L5 root. 4. Foraminal stenosis of the S1 roots on the right.  POSTOPERATIVE DIAGNOSES: 1. Recurrent herniated disk at L5-S1 on the right. 2. Spinal stenosis, L5-S1. 3. Foraminal stenosis of the L5 root. 4. Foraminal stenosis of the S1 roots on the right.  OPERATIONS: 1. Hemilaminectomy, microdiskectomy at L5-S1 for a RECURRENT herniated     disk at L5-S1 on the right. 2. Decompressive lumbar laminectomy at L5-S1 for spinal stenosis. 3. Partial facetectomy at L5-S1 on the right for severe stenosis. 4. Foraminotomies for the L5 root on the right. 5. Foraminotomy for the S1 root on the right for severe foraminal     stenosis.  Note all this occurred because of the collapse of the     disk space at 5-1 after previous surgery.  DESCRIPTION OF PROCEDURE:  Under general anesthesia, routine orthopedic prep and draping of the lower back was carried out.  The appropriate time-out was first carried out.  I also marked the appropriate right side of the back in the holding area.  At this time, 2 needles were placed in the back for localization purposes.  X-ray was taken.  Prior to that, she had 2 g of IV Ancef.  Following that, an incision was made over the L5-S1 space.  Bleeders identified and cauterized.  The incision was carried down to the lamina bilaterally.  We then took another x-ray to verify the position.  I then separated the muscle, as I mentioned, from the lamina  spinous process bilaterally.  Self-retaining McCullough retractors were inserted.  We identified the L5-S1 space with another x- ray.  Following that, we then carried out our hemilaminectomy and decompressive lumbar laminectomy in the usual fashion.  We brought the microscope in.  This was a microscope with popping microdiskectomy. Note, there was excessive amount of scar tissue there from her previous surgery.  I first freed that up.  We noticed at this time she had complete collapse of the disk space with severe compression of the facet and lateral recess on the S1 root.  We first did a nice foraminotomy for the S1 root after we identified the root.  We gently freed up the root. I then went up and did a partial facetectomy, opened up the entire lateral recess, cauterized lateral recess veins.  We then went up proximally and completed our hemilaminectomy and thoroughly exposed the dura and the root.  At this time, another x-ray was taken.  After we identified the L5-S1 space, cruciate incision was made in the L5-S1 space.  We then carried out our microdiskectomy utilizing the microscope.  We utilized the Epstein curettes.  We also utilized the nerve  hook to make sure all the disk material was freed up subligamentous and we went down and checked the foramina above and below and also laterally to complete the dissection of the nerve root that were free.  We had a nice opening of the foramina above and below.  We thoroughly irrigated out the area, loosely applied some Gelfoam, closed the wound in layers in usual fashion.  I left a small deep distal and proximal part of the wound open for drainage purposes.  Subcu was closed with #1 Vicryl.  Prior to that, I injected 20 mL of Exparel into the soft tissue structures.  After subcu was closed, I closed the skin with metal staples.  Sterile Neosporin dressing applied.  The patient left the operating room.           ______________________________ Georges Lynch Darrelyn Hillock, M.D.     RAG/MEDQ  D:  03/07/2013  T:  03/08/2013  Job:  [id number]

## 2013-03-08 NOTE — Evaluation (Signed)
Physical Therapy Evaluation Patient Details Name: Abigail [name] MRN: [id number] DOB: Oct 15, 1965 Today's Date: 03/08/2013 Time: 4098-1191 PT Time Calculation (min): 16 min  PT Assessment / Plan / Recommendation Clinical Impression  47 y.o. female admitted for L5-S1 laminectomy, microdiscectomy. Pt had same procedure in October 2013. Pt is walking independently with 4 wheeled walker 300', and is able to go up/down 9 stairs with handrail independently. REady to DC home from PT standpoint. Encouraged frequent ambulation at home.     PT Assessment  Patent does not need any further PT services    Follow Up Recommendations  No PT follow up    Does the patient have the potential to tolerate intense rehabilitation      Barriers to Discharge        Equipment Recommendations  None recommended by PT    Recommendations for Other Services     Frequency      Precautions / Restrictions Precautions Precautions: Back Restrictions Weight Bearing Restrictions: No   Pertinent Vitals/Pain *1-2/10 surgical site pain, pt denies radiating pain**      Mobility  Bed Mobility Bed Mobility: Rolling Left;Supine to Sit Rolling Left: 7: Independent Supine to Sit: 5: Supervision Details for Bed Mobility Assistance: cues as initially pt started to lift up rather than roll over Transfers Sit to Stand: 5: Supervision Ambulation/Gait Ambulation/Gait Assistance: 6: Modified independent (Device/Increase time) Ambulation Distance (Feet): 300 Feet Assistive device: 4-wheeled walker Gait Pattern: Within Functional Limits Stairs: Yes Stairs Assistance: 6: Modified independent (Device/Increase time) Stair Management Technique: One rail Left;Forwards Number of Stairs: 9    Exercises     PT Diagnosis:    PT Problem List:   PT Treatment Interventions:     PT Goals    Visit Information  Last PT Received On: 03/08/13 Assistance Needed: +1    Subjective Data  Subjective: I feel a lot better this  time.  Patient Stated Goal: to be able to go shopping, tolerate car rides   Prior Functioning  Home Living Lives With: Spouse Available Help at Discharge: Family;Available 24 hours/day Type of Home: House Home Access: Stairs to enter [employer] of Steps: 4 Entrance Stairs-Rails: Right;Left Home Layout: One level Bathroom Shower/Tub: Engineer, manufacturing systems: Standard Home Adaptive Equipment: Bedside commode/3-in-1;Walker - four wheeled;Straight cane (uses 3:1 in tub) Additional Comments: used SPC and 4 wheeled RW prn Prior Function Level of Independence: Needs assistance Driving: Yes Comments: min A for stairs, independent walking with cane or rollator Communication Communication: No difficulties Dominant Hand: Left    Cognition  Cognition Arousal/Alertness: Awake/alert Behavior During Therapy: WFL for tasks assessed/performed Overall Cognitive Status: Within Functional Limits for tasks assessed    Extremity/Trunk Assessment Right Upper Extremity Assessment RUE ROM/Strength/Tone: WFL for tasks assessed Left Upper Extremity Assessment LUE ROM/Strength/Tone: WFL for tasks assessed Right Lower Extremity Assessment RLE ROM/Strength/Tone: Within functional levels RLE Sensation: Deficits RLE Sensation Deficits: slightly decreased to light touch R foot medially and laterally, was completely numb prior to surgery RLE Coordination: WFL - gross/fine motor Left Lower Extremity Assessment LLE ROM/Strength/Tone: Within functional levels LLE Sensation: WFL - Light Touch LLE Coordination: WFL - gross/fine motor Trunk Assessment Trunk Assessment: Normal   Balance    End of Session PT - End of Session Activity Tolerance: Patient tolerated treatment well Patient left: in bed;with call bell/phone within reach;with family/visitor present Nurse Communication: Mobility status  GP Functional Assessment Tool Used: clinical judgement Functional Limitation: Mobility:  Walking and moving around Mobility: Walking and Moving Around  Current Status [phone]): At least 1 percent but less than 20 percent impaired, limited or restricted Mobility: Walking and Moving Around Goal Status [phone]): At least 1 percent but less than 20 percent impaired, limited or restricted Mobility: Walking and Moving Around Discharge Status [phone]): At least 1 percent but less than 20 percent impaired, limited or restricted   Tamala Ser 03/08/2013, 9:09 AM [phone]

## 2013-03-21 LAB — COMPREHENSIVE METABOLIC PANEL
Alkaline Phosphatase: 73 U/L (ref 50–136)
Anion Gap: 17 — ABNORMAL HIGH (ref 7–16)
BUN: 22 mg/dL — ABNORMAL HIGH (ref 7–18)
Calcium, Total: 9.2 mg/dL (ref 8.5–10.1)
Chloride: 103 mmol/L (ref 98–107)
Co2: 20 mmol/L — ABNORMAL LOW (ref 21–32)
Creatinine: 1.08 mg/dL (ref 0.60–1.30)
EGFR (African American): >60
EGFR (Non-African Amer.): >60
Osmolality: 288 (ref 275–301)
Potassium: 3.4 mmol/L — ABNORMAL LOW (ref 3.5–5.1)
SGOT(AST): 23 U/L (ref 15–37)

## 2013-03-21 LAB — URINALYSIS, COMPLETE
Bacteria: NONE SEEN
Glucose,UR: NEGATIVE mg/dL (ref 0–75)
Leukocyte Esterase: NEGATIVE
Ph: 5 (ref 4.5–8.0)
Protein: NEGATIVE
RBC,UR: 15 /HPF (ref 0–5)
Specific Gravity: 1.038 (ref 1.003–1.030)
Squamous Epithelial: 3

## 2013-03-21 LAB — CBC
HGB: 15.3 g/dL (ref 12.0–16.0)
RBC: 4.99 10*6/uL (ref 3.80–5.20)
RDW: 12.6 % (ref 11.5–14.5)

## 2013-03-21 LAB — LIPASE, BLOOD: Lipase: 91 U/L (ref 73–393)

## 2013-03-22 ENCOUNTER — Inpatient Hospital Stay: Payer: Self-pay | Admitting: Surgery

## 2013-03-22 LAB — BASIC METABOLIC PANEL
BUN: 18 mg/dL (ref 7–18)
Calcium, Total: 7.6 mg/dL — ABNORMAL LOW (ref 8.5–10.1)
Creatinine: 0.69 mg/dL (ref 0.60–1.30)
EGFR (African American): >60
EGFR (Non-African Amer.): >60
Glucose: 89 mg/dL (ref 65–99)
Osmolality: 279 (ref 275–301)
Potassium: 2.7 mmol/L — ABNORMAL LOW (ref 3.5–5.1)

## 2013-03-22 LAB — CBC WITH DIFFERENTIAL/PLATELET
Basophil %: 0.3 %
Eosinophil #: 0.1 10*3/uL (ref 0.0–0.7)
Eosinophil %: 0.5 %
HCT: 34.8 % — ABNORMAL LOW (ref 35.0–47.0)
HGB: 12 g/dL (ref 12.0–16.0)
Lymphocyte #: 3.6 10*3/uL (ref 1.0–3.6)
MCHC: 34.4 g/dL (ref 32.0–36.0)
MCV: 91 fL (ref 80–100)
Monocyte #: 0.9 x10 3/mm (ref 0.2–0.9)
Neutrophil #: 6.6 10*3/uL — ABNORMAL HIGH (ref 1.4–6.5)
RBC: 3.83 10*6/uL (ref 3.80–5.20)
RDW: 13 % (ref 11.5–14.5)
WBC: 11.2 10*3/uL — ABNORMAL HIGH (ref 3.6–11.0)

## 2013-03-23 LAB — CBC WITH DIFFERENTIAL/PLATELET
Basophil #: 0.1 10*3/uL (ref 0.0–0.1)
Eosinophil %: 1.7 %
Lymphocyte #: 3.1 10*3/uL (ref 1.0–3.6)
MCH: 31.3 pg (ref 26.0–34.0)
MCHC: 34.5 g/dL (ref 32.0–36.0)
MCV: 91 fL (ref 80–100)
Monocyte #: 0.6 x10 3/mm (ref 0.2–0.9)
Monocyte %: 7.6 %
Neutrophil %: 52.8 %
RBC: 3.88 10*6/uL (ref 3.80–5.20)
RDW: 12.8 % (ref 11.5–14.5)

## 2013-03-23 LAB — BASIC METABOLIC PANEL
Anion Gap: 6 — ABNORMAL LOW (ref 7–16)
BUN: 8 mg/dL (ref 7–18)
Chloride: 112 mmol/L — ABNORMAL HIGH (ref 98–107)
Co2: 26 mmol/L (ref 21–32)
Creatinine: 0.8 mg/dL (ref 0.60–1.30)
EGFR (Non-African Amer.): >60
Glucose: 121 mg/dL — ABNORMAL HIGH (ref 65–99)
Potassium: 3.7 mmol/L (ref 3.5–5.1)
Sodium: 144 mmol/L (ref 136–145)

## 2013-05-23 ENCOUNTER — Ambulatory Visit: Payer: 59

## 2013-06-14 ENCOUNTER — Ambulatory Visit: Payer: Self-pay | Admitting: Surgery

## 2013-06-14 LAB — BASIC METABOLIC PANEL
Co2: 28 mmol/L (ref 21–32)
EGFR (Non-African Amer.): >60
Osmolality: 276 (ref 275–301)

## 2013-06-14 LAB — CBC WITH DIFFERENTIAL/PLATELET
Basophil #: 0.1 10*3/uL (ref 0.0–0.1)
Basophil %: 0.4 %
Eosinophil #: 0.2 10*3/uL (ref 0.0–0.7)
Eosinophil %: 1.3 %
HCT: 41.3 % (ref 35.0–47.0)
HGB: 14.2 g/dL (ref 12.0–16.0)
Lymphocyte #: 3.1 10*3/uL (ref 1.0–3.6)
Lymphocyte %: 18.8 %
MCHC: 34.4 g/dL (ref 32.0–36.0)
MCV: 91 fL (ref 80–100)
Monocyte #: 0.8 x10 3/mm (ref 0.2–0.9)
Monocyte %: 4.6 %
Neutrophil #: 12.5 10*3/uL — ABNORMAL HIGH (ref 1.4–6.5)
Neutrophil %: 74.9 %
Platelet: 238 10*3/uL (ref 150–440)
RBC: 4.53 10*6/uL (ref 3.80–5.20)
RDW: 12.4 % (ref 11.5–14.5)
WBC: 16.6 10*3/uL — ABNORMAL HIGH (ref 3.6–11.0)

## 2013-06-14 LAB — HEPATIC FUNCTION PANEL A (ARMC)
Alkaline Phosphatase: 51 U/L (ref 50–136)
Bilirubin,Total: 0.3 mg/dL (ref 0.2–1.0)
SGPT (ALT): 16 U/L (ref 12–78)

## 2013-06-18 ENCOUNTER — Ambulatory Visit: Payer: Self-pay | Admitting: Anesthesiology

## 2013-06-20 ENCOUNTER — Ambulatory Visit: Payer: Self-pay | Admitting: Surgery

## 2013-06-22 LAB — PATHOLOGY REPORT

## 2013-07-25 ENCOUNTER — Inpatient Hospital Stay: Admit: 2013-07-25 | Payer: 59

## 2013-07-25 ENCOUNTER — Inpatient Hospital Stay
Admit: 2013-07-25 | Discharge: 2013-07-25 | Disposition: A | Discharge status: Home / Self Care | Payer: 59 | Attending: Family Medicine | Admitting: Family Medicine

## 2013-08-04 ENCOUNTER — Emergency Department: Payer: Self-pay | Admitting: Emergency Medicine

## 2013-08-04 LAB — BASIC METABOLIC PANEL
BUN: 17 mg/dL (ref 7–18)
Calcium, Total: 8.4 mg/dL — ABNORMAL LOW (ref 8.5–10.1)
Creatinine: 0.73 mg/dL (ref 0.60–1.30)
EGFR (African American): >60
EGFR (Non-African Amer.): >60
Osmolality: 276 (ref 275–301)
Sodium: 137 mmol/L (ref 136–145)

## 2013-08-04 LAB — CBC
HGB: 13.5 g/dL (ref 12.0–16.0)
MCHC: 34.1 g/dL (ref 32.0–36.0)
Platelet: 211 10*3/uL (ref 150–440)
RDW: 12.7 % (ref 11.5–14.5)
WBC: 12.4 10*3/uL — ABNORMAL HIGH (ref 3.6–11.0)

## 2013-08-04 LAB — FOLATE: Folic Acid: 7.1 ng/mL (ref 3.1–100.0)

## 2013-08-28 ENCOUNTER — Ambulatory Visit (INDEPENDENT_AMBULATORY_CARE_PROVIDER_SITE_OTHER): Payer: 59 | Admitting: Neurology

## 2013-08-28 ENCOUNTER — Encounter: Payer: Self-pay | Admitting: Neurology

## 2013-08-28 VITALS — BP 100/60 | HR 66 | Temp 98.3°F | Ht 65.0 in | Wt 143.0 lb

## 2013-08-28 DIAGNOSIS — R200 Anesthesia of skin: Secondary | ICD-10-CM

## 2013-08-28 DIAGNOSIS — R209 Unspecified disturbances of skin sensation: Secondary | ICD-10-CM

## 2013-08-28 DIAGNOSIS — M549 Dorsalgia, unspecified: Secondary | ICD-10-CM

## 2013-08-28 NOTE — Progress Notes (Signed)
NEUROLOGY CONSULTATION NOTE  Abigail [name] MRN: [id number] DOB: 04-17-1966  Referring provider: Dr. Darrelyn Hillock Primary care provider: Dr. Doristine Counter  Reason for consult:  Paresthesias, pain.  HISTORY OF PRESENT ILLNESS: Abigail [name] is a 47 year old left-handed woman with hypertension and chronic pain who presents for cervical and lumbar pain and paresthesias.  Records and images were personally reviewed where available.    She has history of back and neck pain.  She has been followed for right-sided neck pain.  She also has history of right hand tingling and tingling of the entire right leg since her back surgery.  About two months ago, she began having pain and tingling radiating from the neck down into both shoulders.  She also notes tingling of the entire left leg as well.  She now notes left lower back pain that shoots into her left buttock.  These symptoms occur spontaneously and not with any particular position.  It can occur standing, sitting, walking or laying down.  If the pain in the left buttock occurs while standing or walking, her legs will buckle.  She has fallen a couple of times because of this.  She also has pain in her joints as well.  Due to the alternating symptoms, she had an MRI of the cervical spine performed in October, which revealed mild disc bulge at C3-4, small right posterolateral disc bulge at C4-5, as well as some degenerative disc disease.  However, there is no clear nerve impingement, abnormal cord signal or significant stenosis.  She denies numbness and tingling involving the face.  She denies often feeling lightheaded or going to pass out.  She denies constipation or diarrhea.  She has no appetite.  She denies abnormal sweating.  She notes occasional dry eyes and mouth.  She has had two previous back surgeries.  She underwent right L5-S1 hemi laminectomy and microdiscectomy in May 2014 for lumbar spinal stenosis with neurogenic claudication, which was  successful in resolving her radicular pain.  She has been followed by [facility].  Her back and neck pain are treated with percocet, tramadol-acetaminophen, meloxicam, indomethacin, Robaxin, and gabapentin 300mg  three times daily, usually with good results. Marland Kitchen  PAST MEDICAL HISTORY: Past Medical History  Diagnosis Date  . Hypertension   . Complication of anesthesia     headache when come out of anesthesia  . Headache(784.0)     occ. migraines, not frequent  . Arthritis     arthritis-back, weakness in right leg    PAST SURGICAL HISTORY: Past Surgical History  Procedure Laterality Date  . Left foot bone spur surgery  10 yrs ago  . Abdominal hysterectomy  14 yrs ago  . Lumbar laminectomy/decompression microdiscectomy  07/13/2012    Procedure: LUMBAR LAMINECTOMY/DECOMPRESSION MICRODISCECTOMY 1 LEVEL;  Surgeon: Jacki Cones, MD;  Location: [facility];  Service: Orthopedics;  Laterality: N/A;  Central Decompression Lumbar Laminectomy L5 - S1 (X-Ray)  . Lumbar laminectomy/decompression microdiscectomy Right 03/07/2013    Procedure: LUMBAR DECOMPRESSIVE HEMI LAMINECTOMY AND  MICRODISCECTOMY  L5-S1 RIGHT ;  Surgeon: Jacki Cones, MD;  Location: [facility];  Service: Orthopedics;  Laterality: Right;    MEDICATIONS: Current Outpatient Prescriptions on File Prior to Visit  Medication Sig Dispense Refill  . ALPRAZolam (XANAX) 0.5 MG tablet Take 0.5 mg by mouth 4 (four) times daily as needed for anxiety.       Marland Kitchen amLODipine-olmesartan (AZOR) 5-20 MG per tablet Take 1 tablet by mouth daily before breakfast.      .  aspirin EC 81 MG tablet Take 81 mg by mouth daily.      Marland Kitchen gabapentin (NEURONTIN) 300 MG capsule Take 300 mg by mouth 3 (three) times daily.      . methocarbamol (ROBAXIN) 500 MG tablet Take 1 tablet (500 mg total) by mouth 4 (four) times daily.  40 tablet  2  . metoprolol succinate (TOPROL-XL) 50 MG 24 hr tablet Take 25 mg by mouth 2 (two) times daily. Take with or immediately  following a meal.      . oxyCODONE-acetaminophen (PERCOCET) 10-325 MG per tablet Take 1 tablet by mouth every 4 (four) hours as needed for pain.  40 tablet  0  . PARoxetine (PAXIL) 20 MG tablet Take 20 mg by mouth every morning.       No current facility-administered medications on file prior to visit.    ALLERGIES: No Known Allergies  FAMILY HISTORY: No family history on file.  SOCIAL HISTORY: History   Social History  . Marital Status: Married    Spouse Name: N/A    Number of Children: N/A  . Years of Education: N/A   Occupational History  . Not on file.   Social History Main Topics  . Smoking status: Current Every Day Smoker -- 0.50 packs/day for 21 years  . Smokeless tobacco: Never Used  . Alcohol Use: Yes     Comment: rare  . Drug Use: No  . Sexual Activity: Yes   Other Topics Concern  . Not on file   Social History Narrative  . No narrative on file    REVIEW OF SYSTEMS: Constitutional: No fevers, chills, or sweats, no generalized fatigue, change in appetite Eyes: No visual changes, double vision, eye pain Ear, nose and throat: No hearing loss, ear pain, nasal congestion, sore throat Cardiovascular: No chest pain, palpitations Respiratory:  No shortness of breath at rest or with exertion, wheezes GastrointestinaI: No nausea, vomiting, diarrhea, abdominal pain, fecal incontinence Genitourinary:  No dysuria, urinary retention or frequency Musculoskeletal:  No neck pain, back pain Integumentary: No rash, pruritus, skin lesions Neurological: as above Psychiatric: No depression, insomnia, anxiety Endocrine: No palpitations, fatigue, diaphoresis, mood swings, change in appetite, change in weight, increased thirst Hematologic/Lymphatic:  No anemia, purpura, petechiae. Allergic/Immunologic: no itchy/runny eyes, nasal congestion, recent allergic reactions, rashes  PHYSICAL EXAM: Filed Vitals:   08/28/13 1448  BP: 100/60  Pulse: 66  Temp: 98.3 F (36.8 C)    General: No acute distress Head:  Normocephalic/atraumatic Neck: supple, no paraspinal tenderness, full range of motion Back: No paraspinal tenderness Heart: regular rate and rhythm Lungs: Clear to auscultation bilaterally. Vascular: No carotid bruits. Neurological Exam: Mental status: alert and oriented to person, place, and time, speech fluent and not dysarthric, language intact. Cranial nerves: CN I: not tested CN II: pupils equal, round and reactive to light, visual fields intact, fundi unremarkable. CN III, IV, VI:  full range of motion, no nystagmus, no ptosis CN V: facial sensation intact CN VII: upper and lower face symmetric CN VIII: hearing intact CN IX, X: gag intact, uvula midline CN XI: sternocleidomastoid and trapezius muscles intact CN XII: tongue midline Bulk & Tone: normal, no fasciculations. Motor: 5/5 throughout Sensation: pinprick and vibration intact. Deep Tendon Reflexes: 2+ throughout, toes down Finger to nose testing: normal Heel to shin: normal Gait: mildly antalgic.  No ataxia.  Able to walk in tandem. Romberg negative.  IMPRESSION: 1.  Paresthesias.  Unknown etiology.  Rule out neuropathy or small-fiber neuropathy. 2.  Chronic  pain and radicular pain.  PLAN: 1.  Check fasting glucose, B12, MMA, Lyme, ANA, ESR, RF, SPEP/IFE, TSH 2.  NCV-EMG to look for evidence of a peripheral neuropathy in this patient with known lumbar stenosis and cervical and lumbar degenerative disc disease (testing left side first). 3.  Follow up after above tests.  45 minutes spent with patient, over 50% spent counseling and coordinating care.  Thank you for allowing me to take part in the care of this patient.  Shon Millet, DO  CC:  Marjory Lies, MD  Ranee Gosselin, MD

## 2013-08-28 NOTE — Addendum Note (Signed)
Addended by: Benay Spice on: 08/28/2013 03:58 PM   Modules accepted: Orders

## 2013-08-29 ENCOUNTER — Other Ambulatory Visit: Payer: 59

## 2013-08-29 ENCOUNTER — Other Ambulatory Visit (INDEPENDENT_AMBULATORY_CARE_PROVIDER_SITE_OTHER): Payer: 59

## 2013-08-29 DIAGNOSIS — M549 Dorsalgia, unspecified: Secondary | ICD-10-CM

## 2013-08-29 DIAGNOSIS — R209 Unspecified disturbances of skin sensation: Secondary | ICD-10-CM

## 2013-08-29 LAB — VITAMIN B12: Vitamin B-12: 550 pg/mL (ref 211–911)

## 2013-08-29 LAB — RHEUMATOID FACTOR: Rhuematoid fact SerPl-aCnc: <10 IU/mL (ref <=14)

## 2013-08-29 LAB — SEDIMENTATION RATE: Sed Rate: 5 mm/hr (ref 0–22)

## 2013-08-30 ENCOUNTER — Other Ambulatory Visit: Payer: 59

## 2013-08-30 LAB — B. BURGDORFI ANTIBODIES: B burgdorferi Ab IgG+IgM: 0.49 {ISR}

## 2013-08-31 LAB — PROTEIN ELECTROPHORESIS, SERUM
Beta 2: 4.5 % (ref 3.2–6.5)
Beta Globulin: 5.8 % (ref 4.7–7.2)
Total Protein, Serum Electrophoresis: 6.1 g/dL (ref 6.0–8.3)

## 2013-08-31 LAB — METHYLMALONIC ACID, SERUM: Methylmalonic Acid, Quant: 0.12 umol/L (ref <0.40)

## 2013-09-03 ENCOUNTER — Encounter: Payer: Self-pay | Admitting: Neurology

## 2013-09-12 ENCOUNTER — Encounter: Payer: Self-pay | Admitting: Neurology

## 2013-09-14 ENCOUNTER — Telehealth: Payer: Self-pay | Admitting: Neurology

## 2013-09-14 NOTE — Telephone Encounter (Signed)
Spoke with the patient. She was requesting a sooner NCV/EMG appointment due to increasing pain and discomfort in her back and legs. Appointment moved up to this coming Monday. The patient was fine with this plan.

## 2013-09-14 NOTE — Telephone Encounter (Signed)
Returning a call from the patient left on my vm at 11:44 am re: sx worsening. I left her a message to return my call.

## 2013-09-17 ENCOUNTER — Encounter: Payer: Self-pay | Admitting: Neurology

## 2013-09-17 ENCOUNTER — Ambulatory Visit (INDEPENDENT_AMBULATORY_CARE_PROVIDER_SITE_OTHER): Payer: 59 | Admitting: Neurology

## 2013-09-17 DIAGNOSIS — M549 Dorsalgia, unspecified: Secondary | ICD-10-CM

## 2013-09-17 DIAGNOSIS — R209 Unspecified disturbances of skin sensation: Secondary | ICD-10-CM

## 2013-09-17 DIAGNOSIS — R200 Anesthesia of skin: Secondary | ICD-10-CM

## 2013-09-17 NOTE — Progress Notes (Signed)
See procedure note for EMG results.  Abigail Sprague K. Ivorie Uplinger, DO  

## 2013-09-17 NOTE — Procedures (Signed)
[facility]  [address] [place], [address]  [place], [place] [address] Tel: [phone] Fax:  [phone] Test Date:  09/17/2013  Patient: Abigail [name] DOB: 01-04-1966 Physician: Nita Sickle, DO  Sex: Female Height: 5\' 5"  Ref Phys: Shon Millet  ID#: [id number] Temp: 32.2C Technician:    Patient Complaints: This is a 47 year-old female presenting for evaluation of paresthesias in her legs.  She has previously underwent L5-S1 hemilaminectomy.  NCV & EMG Findings: Extensive evaluation of the left lower extremity reveals: 1. Normal sural and superficial peroneal sensory responses.  2. The peroneal motor response recorded at the extensor digitorum gravis is reduced, but normal at the tibialis anterior.  The tibial motor response is normal. 3. Needle electrode examination shows very sparse chronic motor axon loss affecting L5-myotomes without accompanied active changes.  Impression: There is evidence of a chronic L5-radiculopathy affecting the left leg, very mild in degree electrically. There is no definite evidence of a generalized sensorimotor polyneuropathy affecting the left leg.  However, a small fiber neuropathy cannot be excluded by this study.   ___________________________ Nita Sickle, DO    Nerve Conduction Studies Anti Sensory Summary Table   Site NR Peak (ms) Norm Peak (ms) P-T Amp (V) Norm P-T Amp  Left Sup Peroneal Anti Sensory (Ant Lat Mall)  12 cm    2.6 <4.5 18.2 >5  Left Sural Anti Sensory (Lat Mall)  Calf    3.3 <4.5 11.6 >5   Motor Summary Table   Site NR Onset (ms) Norm Onset (ms) O-P Amp (mV) Norm O-P Amp Site1 Site2 Delta-0 (ms) Dist (cm) Vel (m/s) Norm Vel (m/s)  Left Peroneal Motor (Ext Dig Brev)  Ankle    7.7 <5.5 1.5 >3 B Fib Ankle 5.3 29.0 55 >40  B Fib    13.0  1.3  Poplt B Fib 1.7 10.0 59 >40  Poplt    14.7  1.0         Left Peroneal TA Motor (Tib Ant)  Fib Head    2.5 <4.0 4.8 >4 Poplit Fib Head 1.5 10.0 67 >40  Poplit    4.0  4.8          Left Tibial Motor (Abd Hall Brev)  Ankle    3.4 <6.0 11.9 >8 Knee Ankle 7.6 37.0 49 >40  Knee    11.0  9.3          H Reflex Studies   NR H-Lat (ms) Lat Norm (ms) L-R H-Lat (ms)  Left Tibial (Gastroc)     30.22 <35    EMG   Side Muscle Ins Act Fibs Psw Fasc Number Recrt Dur Dur. Amp Amp. Poly Poly. Comment  Left AntTibialis Nml Nml Nml Nml 1- Mod Few 1+ Nml Nml Few 1+ N/A  Left Gastroc Nml Nml Nml Nml Nml Nml Nml Nml Nml Nml Nml Nml N/A  Left RectFemoris Nml Nml Nml Nml Nml Nml Nml Nml Nml Nml Nml Nml N/A  Left GluteusMed Nml Nml Nml Nml Nml Nml Nml Nml Nml Nml Nml Nml N/A  Left PostTibialis Nml Nml Nml Nml 1- Mod Few 1+ Nml Nml Nml Nml N/A  Left Ext Dig Brev Nml Nml Nml Nml 1- Mod-R Few 1+ Nml Nml Nml Nml N/A      Waveforms:

## 2013-09-20 ENCOUNTER — Ambulatory Visit (INDEPENDENT_AMBULATORY_CARE_PROVIDER_SITE_OTHER): Payer: 59 | Admitting: Neurology

## 2013-09-20 ENCOUNTER — Encounter: Payer: Self-pay | Admitting: Neurology

## 2013-09-20 VITALS — BP 108/68 | HR 64 | Temp 98.3°F | Ht 65.0 in | Wt 140.0 lb

## 2013-09-20 DIAGNOSIS — IMO0001 Reserved for inherently not codable concepts without codable children: Secondary | ICD-10-CM

## 2013-09-20 DIAGNOSIS — M797 Fibromyalgia: Secondary | ICD-10-CM

## 2013-09-20 DIAGNOSIS — R52 Pain, unspecified: Secondary | ICD-10-CM

## 2013-09-20 NOTE — Progress Notes (Signed)
NEUROLOGY FOLLOW UP OFFICE NOTE  Abigail [name] [id number]  HISTORY OF PRESENT ILLNESS: Abigail [name] is a 47 year old left-handed woman with hypertension and chronic pain who follows up for cervical and lumbar pain and paresthesias.  Records and images were personally reviewed where available.    She has history of back and neck pain.  She has been followed for right-sided neck pain.  She also has history of right hand tingling and tingling of the entire right leg since her back surgery.  About two months ago, she began having pain and tingling radiating from the neck down into both shoulders.  She also notes tingling of the entire left leg as well.  She now notes left lower back pain that shoots into her left buttock.  These symptoms occur spontaneously and not with any particular position.  It can occur standing, sitting, walking or laying down.  If the pain in the left buttock occurs while standing or walking, her legs will buckle.  She has fallen a couple of times because of this.  She also has pain in her joints as well.  Due to the alternating symptoms, she had an MRI of the cervical spine performed in October, which revealed mild disc bulge at C3-4, small right posterolateral disc bulge at C4-5, as well as some degenerative disc disease.  However, there is no clear nerve impingement, abnormal cord signal or significant stenosis.  She denies numbness and tingling involving the face.  She denies often feeling lightheaded or going to pass out.  She denies constipation or diarrhea.  She has no appetite.  She denies abnormal sweating.  She notes occasional dry eyes and mouth.  She has had two previous back surgeries.  She underwent right L5-S1 hemi laminectomy and microdiscectomy in May 2014 for lumbar spinal stenosis with neurogenic claudication, which was successful in resolving her radicular pain.  She has been followed by [facility].  Her back and neck pain has been treated with  percocet, tramadol-acetaminophen, meloxicam, indomethacin, Robaxin, and gabapentin 300mg  three times daily, usually with good results.   However, since last visit, she has declined.  She experiences pain in all extremities.  When she stands or walks, she says her legs "feel like jelly".  She notes subjective numbness of the entire left arm and leg.  She has been using a cane and is distraught and tearful.  NCV-EMG performed 09/17/13 revealed evidence of chronic L5 radiculopathy but no active denervation or evidence of peripheral neuropathy.  Labs (08/29/13):  B12 550, ANA negative, ESR 5, SPEP normal, Lyme negative, RF negative, TSH 0.68.  She says she has been checked for diabetes.  PAST MEDICAL HISTORY: Past Medical History  Diagnosis Date  . Hypertension   . Complication of anesthesia     headache when come out of anesthesia  . Headache(784.0)     occ. migraines, not frequent  . Arthritis     arthritis-back, weakness in right leg    MEDICATIONS: Current Outpatient Prescriptions on File Prior to Visit  Medication Sig Dispense Refill  . ALPRAZolam (XANAX) 0.5 MG tablet Take 0.5 mg by mouth 4 (four) times daily as needed for anxiety.       Marland Kitchen amLODipine-olmesartan (AZOR) 5-20 MG per tablet Take 1 tablet by mouth daily before breakfast.      . gabapentin (NEURONTIN) 300 MG capsule Take 300 mg by mouth 3 (three) times daily.      Marland Kitchen HYDROcodone-acetaminophen (NORCO/VICODIN) 5-325 MG per tablet       .  indomethacin (INDOCIN) 25 MG capsule       . meloxicam (MOBIC) 15 MG tablet       . methocarbamol (ROBAXIN) 500 MG tablet Take 1 tablet (500 mg total) by mouth 4 (four) times daily.  40 tablet  2  . metoprolol succinate (TOPROL-XL) 50 MG 24 hr tablet Take 25 mg by mouth 2 (two) times daily. Take with or immediately following a meal.      . oxyCODONE-acetaminophen (PERCOCET) 10-325 MG per tablet Take 1 tablet by mouth every 4 (four) hours as needed for pain.  40 tablet  0  . PARoxetine (PAXIL)  20 MG tablet Take 20 mg by mouth every morning.      . traMADol-acetaminophen (ULTRACET) 37.5-325 MG per tablet       . aspirin EC 81 MG tablet Take 81 mg by mouth daily.       No current facility-administered medications on file prior to visit.    ALLERGIES: No Known Allergies  FAMILY HISTORY: Family History  Problem Relation Age of Onset  . Ataxia Neg Hx   . Chorea Neg Hx   . Dementia Neg Hx   . Mental retardation Neg Hx   . Migraines Neg Hx   . Multiple sclerosis Neg Hx   . Neurofibromatosis Neg Hx   . Neuropathy Neg Hx   . Parkinsonism Neg Hx   . Seizures Neg Hx   . Stroke Neg Hx     SOCIAL HISTORY: History   Social History  . Marital Status: Married    Spouse Name: N/A    Number of Children: N/A  . Years of Education: N/A   Occupational History  . Not on file.   Social History Main Topics  . Smoking status: Current Every Day Smoker -- 0.50 packs/day for 21 years  . Smokeless tobacco: Never Used  . Alcohol Use: Yes     Comment: rare  . Drug Use: No  . Sexual Activity: Yes   Other Topics Concern  . Not on file   Social History Narrative  . No narrative on file    REVIEW OF SYSTEMS: Constitutional: No fevers, chills, or sweats, no generalized fatigue, change in appetite Eyes: No visual changes, double vision, eye pain Ear, nose and throat: No hearing loss, ear pain, nasal congestion, sore throat Cardiovascular: No chest pain, palpitations Respiratory:  No shortness of breath at rest or with exertion, wheezes GastrointestinaI: No nausea, vomiting, diarrhea, abdominal pain, fecal incontinence Genitourinary:  No dysuria, urinary retention or frequency Musculoskeletal:  No neck pain, back pain Integumentary: No rash, pruritus, skin lesions Neurological: as above Psychiatric: No depression, insomnia, anxiety Endocrine: No palpitations, fatigue, diaphoresis, mood swings, change in appetite, change in weight, increased thirst Hematologic/Lymphatic:  No  anemia, purpura, petechiae. Allergic/Immunologic: no itchy/runny eyes, nasal congestion, recent allergic reactions, rashes  PHYSICAL EXAM: Filed Vitals:   09/20/13 1049  BP: 108/68  Pulse: 64  Temp: 98.3 F (36.8 C)   General: Tearful Head:  Normocephalic/atraumatic Neck: supple, bilateral paraspinal tenderness, full range of motion Heart:  Regular rate and rhythm Lungs:  Clear to auscultation bilaterally Back: bilateral paraspinal tenderness Neurological Exam: alert and oriented to person, place, and time. Speech fluent and not dysarthric, language intact.  CN II-XII intact.  Bulk and tone normal, muscle strength 5/5 throughout.  Endorses reduced pinprick of entire left arm and leg, in no dermatomal distribution.  Deep tendon reflexes 2+ throughout, toes downgoing.  Finger to nose testing intact.  Gait antalgic, with  limp on the left, Romberg negative.  IMPRESSION: Diffuse pain and paresthesias.  This may be fibromyalgia.  No electrodiagnostic or radiographic evidence of acute radiculopathy.  Of course small fiber neuropathy may cause paresthesias (and is beyond the scope of NCV-EMG) but presentation is not typical for this.   Lab work does not reveal any evidence of an inflammatory condition.  Her objective exam is normal.  She also exhibited the characteristic tender points associated with fibromyalgia.  PLAN: I feel due to the severity of her pain, she would best be managed by a pain specialist.  We will refer her.   30 minutes spent with patient and her son, over 50% spent counseling and coordinating care.  Shon Millet, DO  CC:  Marjory Lies, MD   Ranee Gosselin, MD

## 2013-09-20 NOTE — Patient Instructions (Addendum)
All the tests looking for a cause of the pain have been negative.  Unfortunately I cannot find a specific neurological cause for your pain.  I think the best management would be treating the pain.  I would like to refer you to a pain specialist for appropriate treatment.  I will refer you to Dr. Ollen Bowl for Pain Management. Their office is located at [address] in [place].    [phone].

## 2013-09-24 ENCOUNTER — Ambulatory Visit: Payer: Self-pay | Admitting: Neurology

## 2013-09-25 LAB — BASIC METABOLIC PANEL
Anion Gap: 4 — ABNORMAL LOW (ref 7–16)
Co2: 27 mmol/L (ref 21–32)
Creatinine: 0.64 mg/dL (ref 0.60–1.30)
EGFR (Non-African Amer.): >60
Sodium: 137 mmol/L (ref 136–145)

## 2013-09-25 LAB — CBC
HCT: 43.7 % (ref 35.0–47.0)
MCV: 91 fL (ref 80–100)
RBC: 4.82 10*6/uL (ref 3.80–5.20)

## 2013-09-26 ENCOUNTER — Observation Stay: Payer: Self-pay | Admitting: Internal Medicine

## 2013-09-27 ENCOUNTER — Encounter (HOSPITAL_COMMUNITY): Payer: Self-pay | Admitting: Orthopedic Surgery

## 2013-09-27 ENCOUNTER — Encounter: Payer: 59 | Admitting: Neurology

## 2013-09-27 ENCOUNTER — Inpatient Hospital Stay (HOSPITAL_COMMUNITY): Payer: 59

## 2013-09-27 ENCOUNTER — Other Ambulatory Visit: Payer: Self-pay | Admitting: Surgical

## 2013-09-27 ENCOUNTER — Inpatient Hospital Stay (HOSPITAL_COMMUNITY)
Admission: AD | Admit: 2013-09-27 | Discharge: 2013-09-30 | DRG: 520 | Disposition: A | Discharge status: Home / Self Care | Payer: 59 | Source: Other Acute Inpatient Hospital | Attending: Orthopedic Surgery | Admitting: Orthopedic Surgery

## 2013-09-27 ENCOUNTER — Observation Stay: Admit: 2013-09-27 | Payer: Self-pay | Admitting: Orthopedic Surgery

## 2013-09-27 DIAGNOSIS — F17200 Nicotine dependence, unspecified, uncomplicated: Secondary | ICD-10-CM | POA: Diagnosis present

## 2013-09-27 DIAGNOSIS — M5126 Other intervertebral disc displacement, lumbar region: Secondary | ICD-10-CM | POA: Diagnosis present

## 2013-09-27 DIAGNOSIS — M5137 Other intervertebral disc degeneration, lumbosacral region: Secondary | ICD-10-CM | POA: Diagnosis present

## 2013-09-27 DIAGNOSIS — I10 Essential (primary) hypertension: Secondary | ICD-10-CM | POA: Diagnosis present

## 2013-09-27 DIAGNOSIS — IMO0002 Reserved for concepts with insufficient information to code with codable children: Secondary | ICD-10-CM

## 2013-09-27 DIAGNOSIS — Z9889 Other specified postprocedural states: Secondary | ICD-10-CM

## 2013-09-27 DIAGNOSIS — R29898 Other symptoms and signs involving the musculoskeletal system: Secondary | ICD-10-CM | POA: Diagnosis present

## 2013-09-27 DIAGNOSIS — I252 Old myocardial infarction: Secondary | ICD-10-CM

## 2013-09-27 DIAGNOSIS — Z7982 Long term (current) use of aspirin: Secondary | ICD-10-CM

## 2013-09-27 DIAGNOSIS — M51379 Other intervertebral disc degeneration, lumbosacral region without mention of lumbar back pain or lower extremity pain: Secondary | ICD-10-CM | POA: Diagnosis present

## 2013-09-27 DIAGNOSIS — I2510 Atherosclerotic heart disease of native coronary artery without angina pectoris: Secondary | ICD-10-CM | POA: Diagnosis present

## 2013-09-27 LAB — URINE MICROSCOPIC-ADD ON

## 2013-09-27 LAB — CBC WITH DIFFERENTIAL/PLATELET
Basophils Absolute: 0 10*3/uL (ref 0.0–0.1)
Basophils Relative: 0 % (ref 0–1)
Eosinophils Absolute: 0.1 10*3/uL (ref 0.0–0.7)
Eosinophils Relative: 0 % (ref 0–5)
HCT: 41.7 % (ref 36.0–46.0)
Hemoglobin: 15.1 g/dL — ABNORMAL HIGH (ref 12.0–15.0)
Lymphocytes Relative: 25 % (ref 12–46)
Lymphs Abs: 4.7 10*3/uL — ABNORMAL HIGH (ref 0.7–4.0)
MCH: 32.5 pg (ref 26.0–34.0)
MCHC: 36.2 g/dL — ABNORMAL HIGH (ref 30.0–36.0)
MCV: 89.7 fL (ref 78.0–100.0)
Monocytes Absolute: 1.4 10*3/uL — ABNORMAL HIGH (ref 0.1–1.0)
Monocytes Relative: 7 % (ref 3–12)
Neutro Abs: 13 10*3/uL — ABNORMAL HIGH (ref 1.7–7.7)
Neutrophils Relative %: 68 % (ref 43–77)
Platelets: 268 10*3/uL (ref 150–400)
RBC: 4.65 MIL/uL (ref 3.87–5.11)
RDW: 12.1 % (ref 11.5–15.5)
WBC: 19.2 10*3/uL — ABNORMAL HIGH (ref 4.0–10.5)

## 2013-09-27 LAB — COMPREHENSIVE METABOLIC PANEL
ALT: 79 U/L — ABNORMAL HIGH (ref 0–35)
AST: 52 U/L — ABNORMAL HIGH (ref 0–37)
Albumin: 3.8 g/dL (ref 3.5–5.2)
Alkaline Phosphatase: 64 U/L (ref 39–117)
BUN: 19 mg/dL (ref 6–23)
CO2: 25 mEq/L (ref 19–32)
Calcium: 9.3 mg/dL (ref 8.4–10.5)
Chloride: 100 mEq/L (ref 96–112)
Creatinine, Ser: 0.76 mg/dL (ref 0.50–1.10)
GFR calc Af Amer: >90 mL/min (ref >90)
GFR calc non Af Amer: >90 mL/min (ref >90)
Glucose, Bld: 98 mg/dL (ref 70–99)
Potassium: 3.9 mEq/L (ref 3.5–5.1)
Sodium: 135 mEq/L (ref 135–145)
Total Bilirubin: 0.6 mg/dL (ref 0.3–1.2)
Total Protein: 6.8 g/dL (ref 6.0–8.3)

## 2013-09-27 LAB — PROTIME-INR
INR: 0.96 (ref 0.00–1.49)
Prothrombin Time: 12.6 seconds (ref 11.6–15.2)

## 2013-09-27 LAB — URINALYSIS, ROUTINE W REFLEX MICROSCOPIC
Bilirubin Urine: NEGATIVE
Glucose, UA: NEGATIVE mg/dL
Ketones, ur: NEGATIVE mg/dL
Leukocytes, UA: NEGATIVE
Nitrite: NEGATIVE
Protein, ur: NEGATIVE mg/dL
Specific Gravity, Urine: 1.019 (ref 1.005–1.030)
Urobilinogen, UA: 0.2 mg/dL (ref 0.0–1.0)
pH: 6 (ref 5.0–8.0)

## 2013-09-27 LAB — SURGICAL PCR SCREEN: MRSA, PCR: NEGATIVE

## 2013-09-27 LAB — APTT: aPTT: 31 seconds (ref 24–37)

## 2013-09-27 MED ORDER — SODIUM CHLORIDE 0.9 % IV SOLN: 250.0000 mL | INTRAVENOUS | Status: DC | PRN

## 2013-09-27 MED ORDER — HYDROMORPHONE HCL PF 1 MG/ML IJ SOLN
0.5000 mg | INTRAMUSCULAR | Status: DC | PRN
  Administered 2013-09-27 (×2): 1 mg via INTRAVENOUS
  Administered 2013-09-27: 0.5 mg via INTRAVENOUS
  Administered 2013-09-28 (×4): 1 mg via INTRAVENOUS
  Filled 2013-09-27 (×8): qty 1

## 2013-09-27 MED ORDER — OXYCODONE HCL 5 MG PO TABS
5.0000 mg | ORAL_TABLET | ORAL | Status: DC | PRN
  Administered 2013-09-27: 10 mg via ORAL
  Filled 2013-09-27: qty 2

## 2013-09-27 MED ORDER — ONDANSETRON HCL 4 MG/2ML IJ SOLN: 4.0000 mg | Freq: Four times a day (QID) | INTRAMUSCULAR | Status: DC | PRN

## 2013-09-27 MED ORDER — ACETAMINOPHEN 325 MG PO TABS: 650.0000 mg | ORAL_TABLET | Freq: Four times a day (QID) | ORAL | Status: DC | PRN

## 2013-09-27 MED ORDER — ACETAMINOPHEN 650 MG RE SUPP: 650.0000 mg | Freq: Four times a day (QID) | RECTAL | Status: DC | PRN

## 2013-09-27 MED ORDER — OXYCODONE HCL 5 MG PO TABS
5.0000 mg | ORAL_TABLET | ORAL | Status: DC | PRN
  Administered 2013-09-27 – 2013-09-30 (×13): 10 mg via ORAL
  Filled 2013-09-27 (×13): qty 2

## 2013-09-27 MED ORDER — FLEET ENEMA 7-19 GM/118ML RE ENEM: 1.0000 | ENEMA | Freq: Once | RECTAL | Status: AC | PRN

## 2013-09-27 MED ORDER — BISACODYL 10 MG RE SUPP: 10.0000 mg | Freq: Every day | RECTAL | Status: DC | PRN

## 2013-09-27 MED ORDER — POLYETHYLENE GLYCOL 3350 17 G PO PACK
17.0000 g | PACK | Freq: Every day | ORAL | Status: DC | PRN
  Administered 2013-09-29: 17 g via ORAL

## 2013-09-27 MED ORDER — LACTATED RINGERS IV SOLN
INTRAVENOUS | Status: DC
  Administered 2013-09-27: 100 mL/h via INTRAVENOUS

## 2013-09-27 MED ORDER — SODIUM CHLORIDE 0.9 % IJ SOLN
3.0000 mL | Freq: Two times a day (BID) | INTRAMUSCULAR | Status: DC
  Administered 2013-09-28: 3 mL via INTRAVENOUS

## 2013-09-27 MED ORDER — SODIUM CHLORIDE 0.9 % IJ SOLN: 3.0000 mL | INTRAMUSCULAR | Status: DC | PRN

## 2013-09-27 MED ORDER — CYCLOBENZAPRINE HCL 10 MG PO TABS
10.0000 mg | ORAL_TABLET | Freq: Two times a day (BID) | ORAL | Status: DC | PRN
  Administered 2013-09-27: 10 mg via ORAL
  Filled 2013-09-27: qty 1

## 2013-09-27 MED ORDER — ONDANSETRON HCL 4 MG PO TABS: 4.0000 mg | ORAL_TABLET | Freq: Four times a day (QID) | ORAL | Status: DC | PRN

## 2013-09-27 NOTE — H&P (Signed)
Abigail [name] is an 47 y.o. female.   Chief ComplaintSevere Left leg Pain DGL:OVFIEPPI Microdiscectomy at L-5-S-1 on the Right,Opposite Side.Now has a positive MRI showing a Large Herniated Lumbar Disc at L-5-S-1 on the LEFT.  Past Medical History  Diagnosis Date  . Hypertension   . Complication of anesthesia     headache when come out of anesthesia  . Headache(784.0)     occ. migraines, not frequent  . Arthritis     arthritis-back, weakness in right leg    Past Surgical History  Procedure Laterality Date  . Left foot bone spur surgery  10 yrs ago  . Abdominal hysterectomy  14 yrs ago  . Lumbar laminectomy/decompression microdiscectomy  07/13/2012    Procedure: LUMBAR LAMINECTOMY/DECOMPRESSION MICRODISCECTOMY 1 LEVEL;  Surgeon: Jacki Cones, MD;  Location: [facility];  Service: Orthopedics;  Laterality: N/A;  Central Decompression Lumbar Laminectomy L5 - S1 (X-Ray)  . Lumbar laminectomy/decompression microdiscectomy Right 03/07/2013    Procedure: LUMBAR DECOMPRESSIVE HEMI LAMINECTOMY AND  MICRODISCECTOMY  L5-S1 RIGHT ;  Surgeon: Jacki Cones, MD;  Location: [facility];  Service: Orthopedics;  Laterality: Right;    Family History  Problem Relation Age of Onset  . Ataxia Neg Hx   . Chorea Neg Hx   . Dementia Neg Hx   . Mental retardation Neg Hx   . Migraines Neg Hx   . Multiple sclerosis Neg Hx   . Neurofibromatosis Neg Hx   . Neuropathy Neg Hx   . Parkinsonism Neg Hx   . Seizures Neg Hx   . Stroke Neg Hx    Social History:  reports that she has been smoking.  She has never used smokeless tobacco. She reports that she drinks alcohol. She reports that she does not use illicit drugs.  Allergies: No Known Allergies  Medications Prior to Admission  Medication Sig Dispense Refill  . ALPRAZolam (XANAX) 0.5 MG tablet Take 0.5 mg by mouth 4 (four) times daily as needed for anxiety.       Marland Kitchen amLODipine-olmesartan (AZOR) 5-20 MG per tablet Take 1 tablet by mouth daily before  breakfast.      . aspirin EC 81 MG tablet Take 81 mg by mouth daily.      Marland Kitchen gabapentin (NEURONTIN) 300 MG capsule Take 300 mg by mouth 3 (three) times daily. 300mg  in AM, 300mg  at lunch and 600mg  in PM      . indomethacin (INDOCIN) 25 MG capsule Take 25 mg by mouth 2 (two) times daily.       . metoprolol succinate (TOPROL-XL) 50 MG 24 hr tablet Take 25 mg by mouth 2 (two) times daily. Take with or immediately following a meal.      . PARoxetine (PAXIL) 20 MG tablet Take 20 mg by mouth every morning.        Results for orders placed during the hospital encounter of 09/27/13 (from the past 48 hour(s))  SURGICAL PCR SCREEN     Status: None   Collection Time    09/27/13  1:29 PM      Result Value Range   MRSA, PCR NEGATIVE  NEGATIVE   Staphylococcus aureus NEGATIVE  NEGATIVE   Comment:            The Xpert SA Assay (FDA     approved for NASAL specimens     in patients over 66 years of age),     is one component of     a comprehensive surveillance  program.  Test performance has     been validated by [facility] for patients greater     than or equal to 47 year old.     It is not intended     to diagnose infection nor to     guide or monitor treatment.  APTT     Status: None   Collection Time    09/27/13  1:50 PM      Result Value Range   aPTT 31  24 - 37 seconds  CBC WITH DIFFERENTIAL     Status: Abnormal   Collection Time    09/27/13  1:50 PM      Result Value Range   WBC 19.2 (*) 4.0 - 10.5 K/uL   RBC 4.65  3.87 - 5.11 MIL/uL   Hemoglobin 15.1 (*) 12.0 - 15.0 g/dL   HCT 16.1  09.6 - 04.5 %   MCV 89.7  78.0 - 100.0 fL   MCH 32.5  26.0 - 34.0 pg   MCHC 36.2 (*) 30.0 - 36.0 g/dL   RDW 40.9  81.1 - 91.4 %   Platelets 268  150 - 400 K/uL   Neutrophils Relative % 68  43 - 77 %   Neutro Abs 13.0 (*) 1.7 - 7.7 K/uL   Lymphocytes Relative 25  12 - 46 %   Lymphs Abs 4.7 (*) 0.7 - 4.0 K/uL   Monocytes Relative 7  3 - 12 %   Monocytes Absolute 1.4 (*) 0.1 - 1.0 K/uL    Eosinophils Relative 0  0 - 5 %   Eosinophils Absolute 0.1  0.0 - 0.7 K/uL   Basophils Relative 0  0 - 1 %   Basophils Absolute 0.0  0.0 - 0.1 K/uL  COMPREHENSIVE METABOLIC PANEL     Status: Abnormal   Collection Time    09/27/13  1:50 PM      Result Value Range   Sodium 135  135 - 145 mEq/L   Potassium 3.9  3.5 - 5.1 mEq/L   Chloride 100  96 - 112 mEq/L   CO2 25  19 - 32 mEq/L   Glucose, Bld 98  70 - 99 mg/dL   BUN 19  6 - 23 mg/dL   Creatinine, Ser 7.82  0.50 - 1.10 mg/dL   Calcium 9.3  8.4 - 95.6 mg/dL   Total Protein 6.8  6.0 - 8.3 g/dL   Albumin 3.8  3.5 - 5.2 g/dL   AST 52 (*) 0 - 37 U/L   ALT 79 (*) 0 - 35 U/L   Alkaline Phosphatase 64  39 - 117 U/L   Total Bilirubin 0.6  0.3 - 1.2 mg/dL   GFR calc non Af Amer >90  >90 mL/min   GFR calc Af Amer >90  >90 mL/min   Comment: (NOTE)     The eGFR has been calculated using the CKD EPI equation.     This calculation has not been validated in all clinical situations.     eGFR's persistently <90 mL/min signify possible Chronic Kidney     Disease.  PROTIME-INR     Status: None   Collection Time    09/27/13  1:50 PM      Result Value Range   Prothrombin Time 12.6  11.6 - 15.2 seconds   INR 0.96  0.00 - 1.49  URINALYSIS, ROUTINE W REFLEX MICROSCOPIC     Status: Abnormal   Collection Time    09/27/13  3:27 PM      Result Value Range   Color, Urine YELLOW  YELLOW   APPearance CLEAR  CLEAR   Specific Gravity, Urine 1.019  1.005 - 1.030   pH 6.0  5.0 - 8.0   Glucose, UA NEGATIVE  NEGATIVE mg/dL   Hgb urine dipstick SMALL (*) NEGATIVE   Bilirubin Urine NEGATIVE  NEGATIVE   Ketones, ur NEGATIVE  NEGATIVE mg/dL   Protein, ur NEGATIVE  NEGATIVE mg/dL   Urobilinogen, UA 0.2  0.0 - 1.0 mg/dL   Nitrite NEGATIVE  NEGATIVE   Leukocytes, UA NEGATIVE  NEGATIVE  URINE MICROSCOPIC-ADD ON     Status: Abnormal   Collection Time    09/27/13  3:27 PM      Result Value Range   Squamous Epithelial / LPF FEW (*) RARE   WBC, UA 0-2  <3  WBC/hpf   RBC / HPF 0-2  <3 RBC/hpf   Bacteria, UA RARE  RARE   Urine-Other MUCOUS PRESENT     Dg Lumbar Spine 2-3 Views  09/27/2013   CLINICAL DATA:  Lumbar disc protrusion.  EXAM: LUMBAR SPINE - 2-3 VIEW  COMPARISON:  MRI dated 09/24/2013  FINDINGS: There is marked narrowing of the L5-S1 disc space. The remainder of the lumbar spine is normal.  IMPRESSION: Degenerative disc disease at L5-S1. Vertebra are labeled for intraoperative comparison.   Electronically Signed   By: Geanie Cooley M.D.   On: 09/27/2013 14:14    Review of Systems  Constitutional: Negative.   HENT: Negative.   Eyes: Negative.   Respiratory: Negative.   Cardiovascular: Negative.   Gastrointestinal: Negative.   Genitourinary: Negative.   Musculoskeletal: Positive for back pain.  Skin: Negative.   Neurological: Positive for sensory change.  Endo/Heme/Allergies: Negative.   Psychiatric/Behavioral: Negative.     Blood pressure 117/74, pulse 66, temperature 98.5 F (36.9 C), temperature source Oral, resp. rate 14, height 5\' 5"  (1.651 m), weight 63.504 kg (140 lb), SpO2 95.00%. Physical Exam  Constitutional: She appears well-developed.  HENT:  Head: Normocephalic.  Eyes: Pupils are equal, round, and reactive to light.  Neck: Normal range of motion.  Cardiovascular: Normal rate.   Respiratory: Effort normal.  GI: Soft.  Musculoskeletal:  Positive Straight Leg Raising on the Left. Sensoy Deficit along the S-1 Nerve root distribution.     Assessment/Plan Hemilaminectomy and Microdiscectomy at L-5-S-1 on the LEFT on Friday.  Haskell Rihn A 09/27/2013, 6:43 PM

## 2013-09-28 ENCOUNTER — Inpatient Hospital Stay (HOSPITAL_COMMUNITY): Payer: 59

## 2013-09-28 ENCOUNTER — Encounter (HOSPITAL_COMMUNITY): Admission: AD | Disposition: A | Discharge status: Home / Self Care | Payer: Self-pay | Attending: Orthopedic Surgery

## 2013-09-28 ENCOUNTER — Inpatient Hospital Stay (HOSPITAL_COMMUNITY): Payer: 59 | Admitting: Anesthesiology

## 2013-09-28 ENCOUNTER — Encounter (HOSPITAL_COMMUNITY): Payer: Self-pay | Admitting: Certified Registered Nurse Anesthetist

## 2013-09-28 ENCOUNTER — Encounter (HOSPITAL_COMMUNITY): Payer: 59 | Admitting: Anesthesiology

## 2013-09-28 ENCOUNTER — Inpatient Hospital Stay (HOSPITAL_COMMUNITY): Admission: RE | Admit: 2013-09-28 | Payer: 59 | Source: Ambulatory Visit | Admitting: Orthopedic Surgery

## 2013-09-28 DIAGNOSIS — M5126 Other intervertebral disc displacement, lumbar region: Secondary | ICD-10-CM | POA: Diagnosis present

## 2013-09-28 HISTORY — PX: HEMI-MICRODISCECTOMY LUMBAR LAMINECTOMY LEVEL 1: SHX5846

## 2013-09-28 SURGERY — HEMI-MICRODISCECTOMY LUMBAR LAMINECTOMY LEVEL 1
Anesthesia: General | Site: Back

## 2013-09-28 MED ORDER — MIDAZOLAM HCL 2 MG/2ML IJ SOLN
INTRAMUSCULAR | Status: AC
  Filled 2013-09-28: qty 2

## 2013-09-28 MED ORDER — THROMBIN 5000 UNITS EX SOLR
CUTANEOUS | Status: DC | PRN
  Administered 2013-09-28: 5000 [IU] via TOPICAL

## 2013-09-28 MED ORDER — PAROXETINE HCL 20 MG PO TABS
20.0000 mg | ORAL_TABLET | Freq: Every morning | ORAL | Status: DC
  Administered 2013-09-28 – 2013-09-30 (×2): 20 mg via ORAL
  Filled 2013-09-28 (×2): qty 1

## 2013-09-28 MED ORDER — LACTATED RINGERS IV SOLN
INTRAVENOUS | Status: DC | PRN
  Administered 2013-09-28 (×2): via INTRAVENOUS

## 2013-09-28 MED ORDER — HYDROMORPHONE HCL PF 1 MG/ML IJ SOLN: 0.2500 mg | INTRAMUSCULAR | Status: DC | PRN

## 2013-09-28 MED ORDER — PHENOL 1.4 % MT LIQD
1.0000 | OROMUCOSAL | Status: DC | PRN
  Filled 2013-09-28: qty 177

## 2013-09-28 MED ORDER — LIDOCAINE HCL (CARDIAC) 20 MG/ML IV SOLN
INTRAVENOUS | Status: DC | PRN
  Administered 2013-09-28: 50 mg via INTRAVENOUS

## 2013-09-28 MED ORDER — FENTANYL CITRATE 0.05 MG/ML IJ SOLN
INTRAMUSCULAR | Status: AC
  Filled 2013-09-28: qty 5

## 2013-09-28 MED ORDER — PROMETHAZINE HCL 25 MG/ML IJ SOLN: 6.2500 mg | INTRAMUSCULAR | Status: DC | PRN

## 2013-09-28 MED ORDER — OXYCODONE HCL 5 MG/5ML PO SOLN
5.0000 mg | Freq: Once | ORAL | Status: DC | PRN
  Filled 2013-09-28: qty 5

## 2013-09-28 MED ORDER — HYDROMORPHONE HCL PF 2 MG/ML IJ SOLN
INTRAMUSCULAR | Status: AC
  Filled 2013-09-28: qty 1

## 2013-09-28 MED ORDER — BACITRACIN-NEOMYCIN-POLYMYXIN 400-5-5000 EX OINT
TOPICAL_OINTMENT | CUTANEOUS | Status: DC | PRN
  Administered 2013-09-28: 1 via TOPICAL

## 2013-09-28 MED ORDER — ALPRAZOLAM 0.5 MG PO TABS: 0.5000 mg | ORAL_TABLET | Freq: Four times a day (QID) | ORAL | Status: DC | PRN

## 2013-09-28 MED ORDER — SODIUM CHLORIDE 0.9 % IR SOLN
Status: DC | PRN
  Administered 2013-09-28 (×2)

## 2013-09-28 MED ORDER — CYCLOBENZAPRINE HCL 10 MG PO TABS: 10.0000 mg | ORAL_TABLET | Freq: Three times a day (TID) | ORAL | Status: DC | PRN

## 2013-09-28 MED ORDER — HYDROMORPHONE HCL PF 1 MG/ML IJ SOLN
0.5000 mg | INTRAMUSCULAR | Status: DC | PRN
  Administered 2013-09-28 – 2013-09-30 (×10): 1 mg via INTRAVENOUS
  Filled 2013-09-28 (×9): qty 1

## 2013-09-28 MED ORDER — MEPERIDINE HCL 50 MG/ML IJ SOLN: 6.2500 mg | INTRAMUSCULAR | Status: DC | PRN

## 2013-09-28 MED ORDER — PROPOFOL 10 MG/ML IV BOLUS
INTRAVENOUS | Status: AC
  Filled 2013-09-28: qty 20

## 2013-09-28 MED ORDER — ONDANSETRON HCL 4 MG/2ML IJ SOLN: 4.0000 mg | INTRAMUSCULAR | Status: DC | PRN

## 2013-09-28 MED ORDER — ROCURONIUM BROMIDE 100 MG/10ML IV SOLN
INTRAVENOUS | Status: AC
  Filled 2013-09-28: qty 1

## 2013-09-28 MED ORDER — BUPIVACAINE-EPINEPHRINE 0.25% -1:200000 IJ SOLN
INTRAMUSCULAR | Status: AC
  Filled 2013-09-28: qty 1

## 2013-09-28 MED ORDER — DEXAMETHASONE SODIUM PHOSPHATE 10 MG/ML IJ SOLN
INTRAMUSCULAR | Status: DC | PRN
  Administered 2013-09-28: 10 mg via INTRAVENOUS

## 2013-09-28 MED ORDER — GLYCOPYRROLATE 0.2 MG/ML IJ SOLN
INTRAMUSCULAR | Status: DC | PRN
  Administered 2013-09-28: 0.4 mg via INTRAVENOUS

## 2013-09-28 MED ORDER — NEOSTIGMINE METHYLSULFATE 1 MG/ML IJ SOLN
INTRAMUSCULAR | Status: DC | PRN
  Administered 2013-09-28: 3 mg via INTRAVENOUS

## 2013-09-28 MED ORDER — OXYCODONE HCL 5 MG PO TABS: 5.0000 mg | ORAL_TABLET | ORAL | Status: DC | PRN

## 2013-09-28 MED ORDER — BUPIVACAINE LIPOSOME 1.3 % IJ SUSP
20.0000 mL | Freq: Once | INTRAMUSCULAR | Status: AC
  Administered 2013-09-28: 20 mL
  Filled 2013-09-28: qty 20

## 2013-09-28 MED ORDER — OXYCODONE HCL 5 MG PO TABS: 5.0000 mg | ORAL_TABLET | Freq: Once | ORAL | Status: DC | PRN

## 2013-09-28 MED ORDER — AMLODIPINE BESYLATE 5 MG PO TABS
5.0000 mg | ORAL_TABLET | Freq: Every day | ORAL | Status: DC
  Administered 2013-09-29 – 2013-09-30 (×2): 5 mg via ORAL
  Filled 2013-09-28 (×2): qty 1

## 2013-09-28 MED ORDER — PROPOFOL 10 MG/ML IV BOLUS
INTRAVENOUS | Status: DC | PRN
  Administered 2013-09-28: 180 mg via INTRAVENOUS

## 2013-09-28 MED ORDER — FENTANYL CITRATE 0.05 MG/ML IJ SOLN
INTRAMUSCULAR | Status: DC | PRN
  Administered 2013-09-28: 150 ug via INTRAVENOUS
  Administered 2013-09-28: 100 ug via INTRAVENOUS

## 2013-09-28 MED ORDER — MENTHOL 3 MG MT LOZG
1.0000 | LOZENGE | OROMUCOSAL | Status: DC | PRN
  Filled 2013-09-28: qty 9

## 2013-09-28 MED ORDER — IRBESARTAN 150 MG PO TABS
150.0000 mg | ORAL_TABLET | Freq: Every day | ORAL | Status: DC
  Administered 2013-09-29 – 2013-09-30 (×2): 150 mg via ORAL
  Filled 2013-09-28 (×2): qty 1

## 2013-09-28 MED ORDER — METHOCARBAMOL 500 MG PO TABS: 500.0000 mg | ORAL_TABLET | Freq: Four times a day (QID) | ORAL | Status: DC | PRN

## 2013-09-28 MED ORDER — AMLODIPINE-OLMESARTAN 5-20 MG PO TABS: 1.0000 | ORAL_TABLET | Freq: Every day | ORAL | Status: DC

## 2013-09-28 MED ORDER — LACTATED RINGERS IV SOLN: INTRAVENOUS | Status: DC | PRN

## 2013-09-28 MED ORDER — BACITRACIN-NEOMYCIN-POLYMYXIN 400-5-5000 EX OINT
TOPICAL_OINTMENT | CUTANEOUS | Status: AC
  Filled 2013-09-28: qty 1

## 2013-09-28 MED ORDER — ACETAMINOPHEN 650 MG RE SUPP: 650.0000 mg | RECTAL | Status: DC | PRN

## 2013-09-28 MED ORDER — LABETALOL HCL 5 MG/ML IV SOLN
INTRAVENOUS | Status: DC | PRN
  Administered 2013-09-28: 10 mg via INTRAVENOUS

## 2013-09-28 MED ORDER — BUPIVACAINE-EPINEPHRINE PF 0.5-1:200000 % IJ SOLN
INTRAMUSCULAR | Status: AC
  Filled 2013-09-28: qty 30

## 2013-09-28 MED ORDER — ACETAMINOPHEN 325 MG PO TABS
650.0000 mg | ORAL_TABLET | ORAL | Status: DC | PRN
  Administered 2013-09-29 (×2): 650 mg via ORAL
  Filled 2013-09-28 (×2): qty 2

## 2013-09-28 MED ORDER — HYDROMORPHONE HCL PF 1 MG/ML IJ SOLN
INTRAMUSCULAR | Status: DC | PRN
  Administered 2013-09-28 (×2): 1 mg via INTRAVENOUS

## 2013-09-28 MED ORDER — THROMBIN 5000 UNITS EX SOLR
CUTANEOUS | Status: AC
  Filled 2013-09-28: qty 5000

## 2013-09-28 MED ORDER — METHOCARBAMOL 100 MG/ML IJ SOLN
500.0000 mg | Freq: Four times a day (QID) | INTRAVENOUS | Status: DC | PRN
  Administered 2013-09-29: 500 mg via INTRAVENOUS
  Filled 2013-09-28 (×3): qty 5

## 2013-09-28 MED ORDER — LABETALOL HCL 5 MG/ML IV SOLN
INTRAVENOUS | Status: AC
  Filled 2013-09-28: qty 4

## 2013-09-28 MED ORDER — ONDANSETRON HCL 4 MG/2ML IJ SOLN
INTRAMUSCULAR | Status: DC | PRN
  Administered 2013-09-28: 4 mg via INTRAVENOUS

## 2013-09-28 MED ORDER — CEFAZOLIN SODIUM 1-5 GM-% IV SOLN
1.0000 g | Freq: Three times a day (TID) | INTRAVENOUS | Status: AC
  Administered 2013-09-28 – 2013-09-29 (×3): 1 g via INTRAVENOUS
  Filled 2013-09-28 (×3): qty 50

## 2013-09-28 MED ORDER — ROCURONIUM BROMIDE 100 MG/10ML IV SOLN
INTRAVENOUS | Status: DC | PRN
  Administered 2013-09-28: 10 mg via INTRAVENOUS
  Administered 2013-09-28: 40 mg via INTRAVENOUS

## 2013-09-28 MED ORDER — LACTATED RINGERS IV SOLN
INTRAVENOUS | Status: DC
  Administered 2013-09-29: 10:00:00 via INTRAVENOUS

## 2013-09-28 MED ORDER — BUPIVACAINE-EPINEPHRINE 0.5% -1:200000 IJ SOLN
INTRAMUSCULAR | Status: AC
  Filled 2013-09-28: qty 1

## 2013-09-28 MED ORDER — DEXAMETHASONE SODIUM PHOSPHATE 10 MG/ML IJ SOLN
INTRAMUSCULAR | Status: AC
  Filled 2013-09-28: qty 1

## 2013-09-28 MED ORDER — ALBUTEROL SULFATE HFA 108 (90 BASE) MCG/ACT IN AERS
INHALATION_SPRAY | RESPIRATORY_TRACT | Status: AC
  Filled 2013-09-28: qty 6.7

## 2013-09-28 MED ORDER — METOPROLOL SUCCINATE ER 25 MG PO TB24
25.0000 mg | ORAL_TABLET | Freq: Two times a day (BID) | ORAL | Status: DC
  Administered 2013-09-28 – 2013-09-30 (×4): 25 mg via ORAL
  Filled 2013-09-28 (×6): qty 1

## 2013-09-28 MED ORDER — MIDAZOLAM HCL 5 MG/5ML IJ SOLN
INTRAMUSCULAR | Status: DC | PRN
  Administered 2013-09-28: 2 mg via INTRAVENOUS

## 2013-09-28 MED ORDER — ONDANSETRON HCL 4 MG/2ML IJ SOLN
INTRAMUSCULAR | Status: AC
  Filled 2013-09-28: qty 2

## 2013-09-28 MED ORDER — CEFAZOLIN SODIUM-DEXTROSE 2-3 GM-% IV SOLR
INTRAVENOUS | Status: AC
  Filled 2013-09-28: qty 50

## 2013-09-28 SURGICAL SUPPLY — 44 items
BAG ZIPLOCK 12X15 (MISCELLANEOUS) ×2 IMPLANT
BENZOIN TINCTURE PRP APPL 2/3 (GAUZE/BANDAGES/DRESSINGS) ×2 IMPLANT
CLEANER TIP ELECTROSURG 2X2 (MISCELLANEOUS) ×2 IMPLANT
CONT SPECI 4OZ STER CLIK (MISCELLANEOUS) ×2 IMPLANT
DRAIN PENROSE 18X1/4 LTX STRL (WOUND CARE) IMPLANT
DRAPE MICROSCOPE LEICA (MISCELLANEOUS) ×2 IMPLANT
DRAPE POUCH INSTRU U-SHP 10X18 (DRAPES) ×2 IMPLANT
DRAPE SURG 17X11 SM STRL (DRAPES) ×2 IMPLANT
DRSG ADAPTIC 3X8 NADH LF (GAUZE/BANDAGES/DRESSINGS) ×2 IMPLANT
DRSG EMULSION OIL 3X16 NADH (GAUZE/BANDAGES/DRESSINGS) ×2 IMPLANT
DRSG EMULSION OIL 3X3 NADH (GAUZE/BANDAGES/DRESSINGS) ×2 IMPLANT
DRSG PAD ABDOMINAL 8X10 ST (GAUZE/BANDAGES/DRESSINGS) ×2 IMPLANT
DURAPREP 26ML APPLICATOR (WOUND CARE) ×2 IMPLANT
ELECT REM PT RETURN 9FT ADLT (ELECTROSURGICAL) ×2
ELECTRODE REM PT RTRN 9FT ADLT (ELECTROSURGICAL) ×1 IMPLANT
GLOVE BIOGEL PI IND STRL 8 (GLOVE) ×1 IMPLANT
GLOVE BIOGEL PI INDICATOR 8 (GLOVE) ×1
GLOVE ECLIPSE 8.0 STRL XLNG CF (GLOVE) ×4 IMPLANT
GOWN PREVENTION PLUS LG XLONG (DISPOSABLE) ×6 IMPLANT
GOWN STRL REIN XL XLG (GOWN DISPOSABLE) ×4 IMPLANT
KIT BASIN OR (CUSTOM PROCEDURE TRAY) ×2 IMPLANT
KIT POSITIONING SURG ANDREWS (MISCELLANEOUS) ×2 IMPLANT
MANIFOLD NEPTUNE II (INSTRUMENTS) ×2 IMPLANT
NEEDLE SPNL 18GX3.5 QUINCKE PK (NEEDLE) ×4 IMPLANT
NS IRRIG 1000ML POUR BTL (IV SOLUTION) ×2 IMPLANT
PAD ABD 8X10 STRL (GAUZE/BANDAGES/DRESSINGS) ×2 IMPLANT
PATTIES SURGICAL .5 X.5 (GAUZE/BANDAGES/DRESSINGS) IMPLANT
PATTIES SURGICAL .75X.75 (GAUZE/BANDAGES/DRESSINGS) IMPLANT
PATTIES SURGICAL 1X1 (DISPOSABLE) IMPLANT
PIN SAFETY NICK PLATE  2 MED (MISCELLANEOUS)
PIN SAFETY NICK PLATE 2 MED (MISCELLANEOUS) IMPLANT
POSITIONER SURGICAL ARM (MISCELLANEOUS) ×2 IMPLANT
SPONGE GAUZE 4X4 12PLY (GAUZE/BANDAGES/DRESSINGS) ×2 IMPLANT
SPONGE LAP 4X18 X RAY DECT (DISPOSABLE) IMPLANT
SPONGE SURGIFOAM ABS GEL 100 (HEMOSTASIS) ×2 IMPLANT
STAPLER VISISTAT 35W (STAPLE) IMPLANT
SUT VIC AB 0 CT1 27 (SUTURE) ×1
SUT VIC AB 0 CT1 27XBRD ANTBC (SUTURE) ×1 IMPLANT
SUT VIC AB 1 CT1 27 (SUTURE) ×4
SUT VIC AB 1 CT1 27XBRD ANTBC (SUTURE) ×4 IMPLANT
TAPE CLOTH SURG 6X10 WHT LF (GAUZE/BANDAGES/DRESSINGS) ×2 IMPLANT
TOWEL OR 17X26 10 PK STRL BLUE (TOWEL DISPOSABLE) ×4 IMPLANT
TRAY LAMINECTOMY (CUSTOM PROCEDURE TRAY) ×2 IMPLANT
WATER STERILE IRR 1500ML POUR (IV SOLUTION) ×2 IMPLANT

## 2013-09-28 NOTE — Care Management Note (Signed)
    Page 1 of 1   09/28/2013     3:29:28 PM   CARE MANAGEMENT NOTE 09/28/2013  Patient:  Abigail [name], Abigail [name]   Account Number:  [id number]  Date Initiated:  09/28/2013  Documentation initiated by:  Colleen Can  Subjective/Objective Assessment:   dx HNP     Action/Plan:   Pt is from home. Surgery today. CM will follow post op.   Anticipated DC Date:  09/29/2013   Anticipated DC Plan:  HOME/SELF CARE      DC Planning Services  CM consult      Choice offered to / List presented to:             Status of service:  In process, will continue to follow Medicare Important Message given?   (If response is "NO", the following Medicare IM given date fields will be blank) Date Medicare IM given:   Date Additional Medicare IM given:    Discharge Disposition:    Per UR Regulation:  Reviewed for med. necessity/level of care/duration of stay  If discussed at Long Length of Stay Meetings, dates discussed:    Comments:

## 2013-09-28 NOTE — H&P (View-Only) (Signed)
Subjective:     Patient reports pain as 6 on 0-10 scale. Ready fo Surgery today. WBC 19.5.Sed Rate is normal and UA is fine.Afebrile.   Objective: Vital signs in last 24 hours: Temp:  [97.5 F (36.4 C)-98.7 F (37.1 C)] 97.5 F (36.4 C) (12/19 0554) Pulse Rate:  [56-66] 58 (12/19 0554) Resp:  [14-15] 14 (12/19 0554) BP: (103-143)/(67-80) 143/80 mmHg (12/19 0554) SpO2:  [95 %-98 %] 97 % (12/19 0554) Weight:  [63.504 kg (140 lb)] 63.504 kg (140 lb) (12/18 1250)  Intake/Output from previous day: 12/18 0701 - 12/19 0700 In: 560 [I.V.:560] Out: -  Intake/Output this shift:     Recent Labs  09/27/13 1350  HGB 15.1*    Recent Labs  09/27/13 1350  WBC 19.2*  RBC 4.65  HCT 41.7  PLT 268    Recent Labs  09/27/13 1350  NA 135  K 3.9  CL 100  CO2 25  BUN 19  CREATININE 0.76  GLUCOSE 98  CALCIUM 9.3    Recent Labs  09/27/13 1350  INR 0.96    ABD soft Dorsiflexion/Plantar flexion intact No cellulitis present  Assessment/Plan:     Plan for discharge tomorrow.Surgery today.  Jackson Fetters A 09/28/2013, 7:32 AM

## 2013-09-28 NOTE — Discharge Instructions (Addendum)
You may shower starting Monday For the first few days, remove your dressing, tape a piece of saran wrap over your incision, take your shower, then remove the saran wrap and put a clean dressing on. After two days you can shower without the saran wrap.  Change your dressing daily. Shower only, no tub bath. Call if any temperatures greater than 101 or any wound complications: [phone] during the day and ask for Dr. Jeannetta Ellis nurse, Mackey Birchwood.

## 2013-09-28 NOTE — Brief Op Note (Signed)
09/27/2013 - 09/28/2013  9:26 PM  PATIENT:  Abigail [name]  47 y.o. female  PRE-OPERATIVE DIAGNOSIS:   HERNIATED Lumbar Disc and Lateral Recess Stenosis with Foraminal Stenosis of L-5 and S-1 Nerve roots,TWO Nerve Roots Compressed.  POST-OPERATIVE DIAGNOSIS:  Same as Pre-Op.    PROCEDURE:  Procedure(s): HEMI-MICRODISCECTOMY LUMBAR LAMINECTOMY L5-S1 LEFT (N/A)and Decompression of Lateral Recess for Lateral Recess Stenosis and Foraminotomies for L-5 and S-1 Nerve roots on the Left.  SURGEON:  Surgeon(s) and Role:    * Jacki Cones, MD - Primary    * Drucilla Schmidt, MD - Assisting     ASSISTANTS:James Aplington MD  ANESTHESIA:   general  EBL:  Total I/O In: 400 [I.V.:400] Out: -   BLOOD ADMINISTERED:none  DRAINS: none   LOCAL MEDICATIONS USED:  MARCAINE 20cc of0.50% with Epinephrine  At start of case and Exparel 20cc at end of case.     SPECIMEN:  Source of Specimen:  L-5-S-1 interspace  DISPOSITION OF SPECIMEN:  PATHOLOGY  COUNTS:  YES  TOURNIQUET:  * No tourniquets in log *  DICTATION: .Other Dictation: Dictation Number [id number]  PLAN OF CARE: Admit to inpatient   PATIENT DISPOSITION:  PACU - hemodynamically stable.   Delay start of Pharmacological VTE agent (>24hrs) due to surgical blood loss or risk of bleeding: yes

## 2013-09-28 NOTE — Transfer of Care (Signed)
Immediate Anesthesia Transfer of Care Note  Patient: Abigail [name]  Procedure(s) Performed: Procedure(s): HEMI-MICRODISCECTOMY LUMBAR LAMINECTOMY L5-S1 LEFT (N/A)  Patient Location: PACU  Anesthesia Type:General  Level of Consciousness: awake and alert   Airway & Oxygen Therapy: Patient Spontanous Breathing and Patient connected to face mask oxygen  Post-op Assessment: Report given to PACU RN and Post -op Vital signs reviewed and unstable, Anesthesiologist notified  Post vital signs: Reviewed and stable  Complications: No apparent anesthesia complications

## 2013-09-28 NOTE — Anesthesia Preprocedure Evaluation (Signed)
Anesthesia Evaluation  Patient identified by MRN, date of birth, ID band Patient awake    Reviewed: Allergy & Precautions, H&P , NPO status , Patient's Chart, lab work & pertinent test results, reviewed documented beta blocker date and time   History of Anesthesia Complications Negative for: history of anesthetic complications  Airway Mallampati: II TM Distance: >3 FB Neck ROM: Full    Dental  (+) Dental Advisory Given and Teeth Intact   Pulmonary Current Smoker,  breath sounds clear to auscultation  Pulmonary exam normal       Cardiovascular hypertension, Pt. on medications and Pt. on home beta blockers - CAD, - Past MI and - CHF Rhythm:Regular Rate:Normal     Neuro/Psych  Headaches,  Neuromuscular disease negative psych ROS   GI/Hepatic negative GI ROS, Neg liver ROS,   Endo/Other  negative endocrine ROS  Renal/GU negative Renal ROS     Musculoskeletal   Abdominal   Peds  Hematology negative hematology ROS (+)   Anesthesia Other Findings   Reproductive/Obstetrics                           Anesthesia Physical  Anesthesia Plan  ASA: II  Anesthesia Plan: General   Post-op Pain Management:    Induction: Intravenous  Airway Management Planned: Oral ETT  Additional Equipment:   Intra-op Plan:   Post-operative Plan: Extubation in OR  Informed Consent: I have reviewed the patients History and Physical, chart, labs and discussed the procedure including the risks, benefits and alternatives for the proposed anesthesia with the patient or authorized representative who has indicated his/her understanding and acceptance.   Dental advisory given  Plan Discussed with: CRNA  Anesthesia Plan Comments:         Anesthesia Quick Evaluation

## 2013-09-28 NOTE — Progress Notes (Signed)
Subjective:     Patient reports pain as 6 on 0-10 scale. Ready fo Surgery today. WBC 19.5.Sed Rate is normal and UA is fine.Afebrile.   Objective: Vital signs in last 24 hours: Temp:  [97.5 F (36.4 C)-98.7 F (37.1 C)] 97.5 F (36.4 C) (12/19 0554) Pulse Rate:  [56-66] 58 (12/19 0554) Resp:  [14-15] 14 (12/19 0554) BP: (103-143)/(67-80) 143/80 mmHg (12/19 0554) SpO2:  [95 %-98 %] 97 % (12/19 0554) Weight:  [63.504 kg (140 lb)] 63.504 kg (140 lb) (12/18 1250)  Intake/Output from previous day: 12/18 0701 - 12/19 0700 In: 560 [I.V.:560] Out: -  Intake/Output this shift:     Recent Labs  09/27/13 1350  HGB 15.1*    Recent Labs  09/27/13 1350  WBC 19.2*  RBC 4.65  HCT 41.7  PLT 268    Recent Labs  09/27/13 1350  NA 135  K 3.9  CL 100  CO2 25  BUN 19  CREATININE 0.76  GLUCOSE 98  CALCIUM 9.3    Recent Labs  09/27/13 1350  INR 0.96    ABD soft Dorsiflexion/Plantar flexion intact No cellulitis present  Assessment/Plan:     Plan for discharge tomorrow.Surgery today.  Aldonia Keeven A 09/28/2013, 7:32 AM  

## 2013-09-28 NOTE — Interval H&P Note (Signed)
History and Physical Interval Note:  09/28/2013 6:16 PM  Abigail [name]  has presented today for surgery, with the diagnosis of RUPTURED HERNIATED BACK  The various methods of treatment have been discussed with the patient and family. After consideration of risks, benefits and other options for treatment, the patient has consented to  Procedure(s): HEMI-MICRODISCECTOMY LUMBAR LAMINECTOMY L5-S1 (N/A) as a surgical intervention .  The patient's history has been reviewed, patient examined, no change in status, stable for surgery.  I have reviewed the patient's chart and labs.  Questions were answered to the patient's satisfaction.     Jenna Ardoin A

## 2013-09-29 NOTE — Evaluation (Signed)
Occupational Therapy Evaluation Patient Details Name: Abigail [name] MRN: [id number] DOB: 09/22/1966 Today's Date: 09/29/2013 Time: 1240-1250 OT Time Calculation (min): 10 min  OT Assessment / Plan / Recommendation History of present illness HEMI-MICRODISCECTOMY LUMBAR LAMINECTOMY L5-S1 LEFT    Clinical Impression   Pt was admitted for the above surgery.  She has a h/o previous back surgeries, and OT re-educated on back precautions related to adls/bathroom transfers.  Pt does not need any further OT at this time.    OT Assessment       Follow Up Recommendations  No OT follow up    Barriers to Discharge      Equipment Recommendations  None recommended by OT    Recommendations for Other Services    Frequency       Precautions / Restrictions Precautions Precautions: Back Precaution Booklet Issued: Yes (comment) Restrictions Weight Bearing Restrictions: No   Pertinent Vitals/Pain Pain "ok".  Pt had headache earlier    ADL  ADL Comments: Pt was seen for brief evaluation to re-educate on back precautions and ADLs.  Pt states husband has been helping her step into tub and she sits on a 3:1 commode as a tub seat.  Educated on sidestepping and bending knee to step in.  Husband has been helping with LB adls and pt stays in PJs all day.  She gets to 3:1 commode herself.  Educated on toilet aide--she was not familiar with this, and this would be really helpful for her.  Informed that gift shop carries them.  Pt did not have any further questions or concerns.      OT Diagnosis:    OT Problem List:   OT Treatment Interventions:     OT Goals(Current goals can be found in the care plan section)    Visit Information  Last OT Received On: 09/29/13 Assistance Needed: +1 History of Present Illness: HEMI-MICRODISCECTOMY LUMBAR LAMINECTOMY L5-S1 LEFT        Prior Functioning     Home Living Family/patient expects to be discharged to:: Private residence Living Arrangements:  Spouse/significant other Available Help at Discharge: Family Type of Home: House Home Access: Stairs to enter Secretary/administrator of Steps: 4 Entrance Stairs-Rails: Right;Left Home Layout: One level Home Equipment: Cane - single point;Walker - 4 wheels;Bedside commode;Walker - 2 wheels Additional Comments: used SPC prirot o surgery Prior Function Level of Independence: Needs assistance Communication Communication: No difficulties         Vision/Perception     Cognition  Cognition Arousal/Alertness: Awake/alert Behavior During Therapy: WFL for tasks assessed/performed Overall Cognitive Status: Within Functional Limits for tasks assessed    Extremity/Trunk Assessment      Mobility      Exercise     Balance     End of Session  Pt left in bed, sidelying on R  GO     [name] 09/29/2013, 1:23 PM Marica Otter, OTR/L [phone] 09/29/2013

## 2013-09-29 NOTE — Progress Notes (Signed)
Subjective: 1 Day Post-Op Procedure(s) (LRB): HEMI-MICRODISCECTOMY LUMBAR LAMINECTOMY L5-S1 LEFT (N/A) Patient reports pain as mild.  Seen in AM rounds by Dr. Lequita Halt. Has not yet been OOB. Does note back pain, no leg pain.   Objective: Vital signs in last 24 hours: Temp:  [97.5 F (36.4 C)-98.9 F (37.2 C)] 98.5 F (36.9 C) (12/20 0538) Pulse Rate:  [50-80] 80 (12/20 0538) Resp:  [14-16] 14 (12/20 0538) BP: (95-125)/(49-78) 117/67 mmHg (12/20 0538) SpO2:  [96 %-100 %] 98 % (12/20 0538) Weight:  [63.504 kg (140 lb)] 63.504 kg (140 lb) (12/19 2223)  Intake/Output from previous day: 12/19 0701 - 12/20 0700 In: 4185 [P.O.:240; I.V.:3840; IV Piggyback:105] Out: 1675 [Urine:1675] Intake/Output this shift:     Recent Labs  09/27/13 1350  HGB 15.1*    Recent Labs  09/27/13 1350  WBC 19.2*  RBC 4.65  HCT 41.7  PLT 268    Recent Labs  09/27/13 1350  NA 135  K 3.9  CL 100  CO2 25  BUN 19  CREATININE 0.76  GLUCOSE 98  CALCIUM 9.3    Recent Labs  09/27/13 1350  INR 0.96    Neurologically intact ABD soft Neurovascular intact Sensation intact distally Intact pulses distally Dorsiflexion/Plantar flexion intact Incision: dressing C/D/I and no drainage No cellulitis present Compartment soft no calf pain or sign of DVT  Assessment/Plan: 1 Day Post-Op Procedure(s) (LRB): HEMI-MICRODISCECTOMY LUMBAR LAMINECTOMY L5-S1 LEFT (N/A) Advance diet Up with therapy D/C IV fluids Plan for discharge tomorrow after PT today  Tationa Stech M. 09/29/2013, 7:39 AM

## 2013-09-29 NOTE — Evaluation (Signed)
Physical Therapy Evaluation Patient Details Name: Abigail [name] MRN: [id number] DOB: 10/02/1966 Today's Date: 09/29/2013 Time: 3086-5784 PT Time Calculation (min): 16 min  PT Assessment / Plan / Recommendation History of Present Illness  HEMI-MICRODISCECTOMY LUMBAR LAMINECTOMY L5-S1 LEFT   Clinical Impression  Pt will benefit from PT to address deficits below; Stair training prior to D/C in am    PT Assessment  Patient needs continued PT services    Follow Up Recommendations  No PT follow up    Does the patient have the potential to tolerate intense rehabilitation      Barriers to Discharge        Equipment Recommendations  None recommended by PT    Recommendations for Other Services     Frequency 7X/week    Precautions / Restrictions Precautions Precautions: Back Precaution Booklet Issued: Yes (comment) Restrictions Weight Bearing Restrictions: No   Pertinent Vitals/Pain No c/o pain much better than earlier, no c/o HA      Mobility  Bed Mobility Bed Mobility: Rolling Right;Right Sidelying to Sit Rolling Right: 4: Min guard;5: Supervision Right Sidelying to Sit: 4: Min guard;5: Supervision;HOB flat Details for Bed Mobility Assistance: cues for log roll Transfers Transfers: Sit to Stand;Stand to Sit Sit to Stand: 5: Supervision Stand to Sit: 5: Supervision Details for Transfer Assistance: cues for back precautions Ambulation/Gait Ambulation/Gait Assistance: 4: Min guard Ambulation Distance (Feet): 100 Feet Assistive device: Rolling walker Ambulation/Gait Assistance Details: cues for back precautions with turns/correct RW use Gait Pattern: Step-through pattern;Decreased trunk rotation    Exercises     PT Diagnosis: Difficulty walking  PT Problem List: Decreased balance;Decreased knowledge of use of DME;Decreased knowledge of precautions PT Treatment Interventions: DME instruction;Gait training;Stair training;Patient/family education     PT  Goals(Current goals can be found in the care plan section) Acute Rehab PT Goals Time For Goal Achievement: 10/01/13 Potential to Achieve Goals: Good  Visit Information  Last PT Received On: 09/29/13 Assistance Needed: +1 History of Present Illness: HEMI-MICRODISCECTOMY LUMBAR LAMINECTOMY L5-S1 LEFT        Prior Functioning  Home Living Family/patient expects to be discharged to:: Private residence Living Arrangements: Spouse/significant other Available Help at Discharge: Family Type of Home: House Home Access: Stairs to enter Secretary/administrator of Steps: 4 Entrance Stairs-Rails: Right;Left Home Layout: One level Home Equipment: Cane - single point;Walker - 4 wheels;Bedside commode;Walker - 2 wheels Additional Comments: used SPC prirot o surgery Communication Communication: No difficulties    Cognition  Cognition Arousal/Alertness: Awake/alert Behavior During Therapy: WFL for tasks assessed/performed Overall Cognitive Status: Within Functional Limits for tasks assessed    Extremity/Trunk Assessment Upper Extremity Assessment Upper Extremity Assessment: Defer to OT evaluation Lower Extremity Assessment Lower Extremity Assessment: Overall WFL for tasks assessed   Balance    End of Session PT - End of Session Activity Tolerance: Patient tolerated treatment well Patient left: in chair;with call bell/phone within reach Nurse Communication: Mobility status  GP     [facility] 09/29/2013, 11:52 AM

## 2013-09-30 LAB — CBC WITH DIFFERENTIAL/PLATELET
Basophils Absolute: 0 10*3/uL (ref 0.0–0.1)
Basophils Relative: 0 % (ref 0–1)
Eosinophils Absolute: 0.2 10*3/uL (ref 0.0–0.7)
Eosinophils Relative: 1 % (ref 0–5)
HCT: 37.3 % (ref 36.0–46.0)
Hemoglobin: 12.5 g/dL (ref 12.0–15.0)
Lymphocytes Relative: 30 % (ref 12–46)
Lymphs Abs: 3.6 10*3/uL (ref 0.7–4.0)
MCH: 30.5 pg (ref 26.0–34.0)
MCHC: 33.5 g/dL (ref 30.0–36.0)
MCV: 91 fL (ref 78.0–100.0)
Monocytes Absolute: 1.3 10*3/uL — ABNORMAL HIGH (ref 0.1–1.0)
Monocytes Relative: 10 % (ref 3–12)
Neutro Abs: 7 10*3/uL (ref 1.7–7.7)
Neutrophils Relative %: 58 % (ref 43–77)
Platelets: 194 10*3/uL (ref 150–400)
RBC: 4.1 MIL/uL (ref 3.87–5.11)
RDW: 12.2 % (ref 11.5–15.5)
WBC: 12.1 10*3/uL — ABNORMAL HIGH (ref 4.0–10.5)

## 2013-09-30 NOTE — Anesthesia Postprocedure Evaluation (Signed)
Anesthesia Post Note  Patient: Abigail [name]  Procedure(s) Performed: Procedure(s) (LRB): HEMI-MICRODISCECTOMY LUMBAR LAMINECTOMY L5-S1 LEFT (N/A)  Anesthesia type: General  Patient location: PACU  Post pain: Pain level controlled  Post assessment: Post-op Vital signs reviewed  Last Vitals: BP 136/88  Pulse 66  Temp(Src) 36.8 C (Oral)  Resp 16  Ht 5\' 5"  (1.651 m)  Wt 140 lb (63.504 kg)  BMI 23.30 kg/m2  SpO2 97%  Post vital signs: Reviewed  Level of consciousness: sedated  Complications: No apparent anesthesia complications

## 2013-09-30 NOTE — Progress Notes (Signed)
Physical Therapy Treatment Patient Details Name: Abigail [name] MRN: [id number] DOB: April 13, 1966 Today's Date: 09/30/2013 Time: 7829-5621 PT Time Calculation (min): 23 min  PT Assessment / Plan / Recommendation  History of Present Illness HEMI-MICRODISCECTOMY LUMBAR LAMINECTOMY L5-S1 LEFT    PT Comments   Pt doing well today; husband present for session  Follow Up Recommendations  No PT follow up     Does the patient have the potential to tolerate intense rehabilitation     Barriers to Discharge        Equipment Recommendations  None recommended by PT    Recommendations for Other Services    Frequency 7X/week   Progress towards PT Goals Progress towards PT goals: Progressing toward goals  Plan Current plan remains appropriate    Precautions / Restrictions Precautions Precautions: Back Precaution Comments: reviewed back precautions, pt able to verbalize 3/3 and return demo during mobility with subtle cues Restrictions Weight Bearing Restrictions: No Other Position/Activity Restrictions: reviewed pillow placement correct positioning in bed   Pertinent Vitals/Pain Painful initially but tolerable per pt    Mobility  Bed Mobility Bed Mobility: Rolling Right;Right Sidelying to Sit;Sit to Sidelying Right Rolling Right: 5: Supervision Right Sidelying to Sit: 5: Supervision Sit to Sidelying Right: 5: Supervision Details for Bed Mobility Assistance: cues for log roll Transfers Transfers: Sit to Stand;Stand to Sit Sit to Stand: 5: Supervision;6: Modified independent (Device/Increase time) Stand to Sit: 5: Supervision;6: Modified independent (Device/Increase time) Details for Transfer Assistance: cues for back precautions Ambulation/Gait Ambulation/Gait Assistance: 5: Supervision Ambulation Distance (Feet): 170 Feet (times 2) Assistive device: Rolling walker Ambulation/Gait Assistance Details: cues for scapular depression, breathing Gait Pattern: Decreased trunk  rotation;Decreased stride length Gait velocity: decr Stairs: Yes Stairs Assistance: 4: Min guard Stair Management Technique: One rail Right;Forwards;Step to pattern Number of Stairs: 4    Exercises     PT Diagnosis:    PT Problem List:   PT Treatment Interventions:     PT Goals (current goals can now be found in the care plan section) Acute Rehab PT Goals Patient Stated Goal: home Time For Goal Achievement: 10/01/13 Potential to Achieve Goals: Good  Visit Information  Last PT Received On: 09/30/13 Assistance Needed: +1 History of Present Illness: HEMI-MICRODISCECTOMY LUMBAR LAMINECTOMY L5-S1 LEFT     Subjective Data  Patient Stated Goal: home   Cognition  Cognition Arousal/Alertness: Awake/alert Behavior During Therapy: WFL for tasks assessed/performed Overall Cognitive Status: Within Functional Limits for tasks assessed    Balance     End of Session PT - End of Session Activity Tolerance: Patient tolerated treatment well Patient left: in bed;with call bell/phone within reach;with family/visitor present Nurse Communication: Mobility status   GP     [facility] 09/30/2013, 10:41 AM

## 2013-09-30 NOTE — Progress Notes (Signed)
Subjective: 2 Days Post-Op Procedure(s) (LRB): HEMI-MICRODISCECTOMY LUMBAR LAMINECTOMY L5-S1 LEFT (N/A) Patient reports pain as 2 on 0-10 scale.Dressing changed snd wound looks fine. No further leg pain. Will repeat WBC and have them call me with results. She is Afebrile.  Objective: Vital signs in last 24 hours: Temp:  [98.3 F (36.8 C)-99.2 F (37.3 C)] 98.3 F (36.8 C) (12/21 0535) Pulse Rate:  [62-73] 64 (12/21 0535) Resp:  [16-18] 16 (12/21 0535) BP: (100-129)/(63-78) 100/63 mmHg (12/21 0535) SpO2:  [94 %-99 %] 97 % (12/21 0535)  Intake/Output from previous day: 12/20 0701 - 12/21 0700 In: 2085.7 [P.O.:840; I.V.:1145.7; IV Piggyback:100] Out: 1750 [Urine:1750] Intake/Output this shift:     Recent Labs  09/27/13 1350  HGB 15.1*    Recent Labs  09/27/13 1350  WBC 19.2*  RBC 4.65  HCT 41.7  PLT 268    Recent Labs  09/27/13 1350  NA 135  K 3.9  CL 100  CO2 25  BUN 19  CREATININE 0.76  GLUCOSE 98  CALCIUM 9.3    Recent Labs  09/27/13 1350  INR 0.96    Neurologically intact  Assessment/Plan: 2 Days Post-Op Procedure(s) (LRB): HEMI-MICRODISCECTOMY LUMBAR LAMINECTOMY L5-S1 LEFT (N/A) Discharge home with home health  Kaslyn Richburg A 09/30/2013, 8:15 AM

## 2013-09-30 NOTE — Progress Notes (Signed)
Discharged from floor via w/c, spouse with pt. No changes in assessment. Abigail [name]   

## 2013-09-30 NOTE — Discharge Summary (Signed)
Physician Discharge Summary   Patient ID: Abigail [name] MRN: [id number] DOB/AGE: Apr 01, 1966 47 y.o.  Admit date: 09/27/2013 Discharge date: 09/30/2013  Primary Diagnosis: Lumbar disc herniation Admission Diagnoses:  Past Medical History  Diagnosis Date  . Hypertension   . Complication of anesthesia     headache when come out of anesthesia  . Headache(784.0)     occ. migraines, not frequent  . Arthritis     arthritis-back, weakness in right leg   Discharge Diagnoses:   Active Problems:   Recurrent herniation of lumbar disc   Herniated lumbar intervertebral disc  Estimated body mass index is 23.3 kg/(m^2) as calculated from the following:   Height as of this encounter: 5\' 5"  (1.651 m).   Weight as of this encounter: 63.504 kg (140 lb).  Procedure:  Procedure(s) (LRB): HEMI-MICRODISCECTOMY LUMBAR LAMINECTOMY L5-S1 LEFT (N/A)   Consults: None  HPI: Abigail [name] was transferred from [facility] after she was admitted for pain control. MRI of her lumbar spine showed a large herniated disc at L5-S1 on the left. She has a history of a previous lumbar microdiscectomy at the same level but on the right.  Laboratory Data: Admission on 09/27/2013, Discharged on 09/30/2013  Component Date Value Range Status  . aPTT 09/27/2013 31  24 - 37 seconds Final  . WBC 09/27/2013 19.2* 4.0 - 10.5 K/uL Final  . RBC 09/27/2013 4.65  3.87 - 5.11 MIL/uL Final  . Hemoglobin 09/27/2013 15.1* 12.0 - 15.0 g/dL Final  . HCT 62/13/0865 41.7  36.0 - 46.0 % Final  . MCV 09/27/2013 89.7  78.0 - 100.0 fL Final  . MCH 09/27/2013 32.5  26.0 - 34.0 pg Final  . MCHC 09/27/2013 36.2* 30.0 - 36.0 g/dL Final  . RDW 78/46/9629 12.1  11.5 - 15.5 % Final  . Platelets 09/27/2013 268  150 - 400 K/uL Final  . Neutrophils Relative % 09/27/2013 68  43 - 77 % Final  . Neutro Abs 09/27/2013 13.0* 1.7 - 7.7 K/uL Final  . Lymphocytes Relative 09/27/2013 25  12 - 46 % Final  . Lymphs Abs 09/27/2013 4.7* 0.7 - 4.0 K/uL Final  .  Monocytes Relative 09/27/2013 7  3 - 12 % Final  . Monocytes Absolute 09/27/2013 1.4* 0.1 - 1.0 K/uL Final  . Eosinophils Relative 09/27/2013 0  0 - 5 % Final  . Eosinophils Absolute 09/27/2013 0.1  0.0 - 0.7 K/uL Final  . Basophils Relative 09/27/2013 0  0 - 1 % Final  . Basophils Absolute 09/27/2013 0.0  0.0 - 0.1 K/uL Final  . Sodium 09/27/2013 135  135 - 145 mEq/L Final  . Potassium 09/27/2013 3.9  3.5 - 5.1 mEq/L Final  . Chloride 09/27/2013 100  96 - 112 mEq/L Final  . CO2 09/27/2013 25  19 - 32 mEq/L Final  . Glucose, Bld 09/27/2013 98  70 - 99 mg/dL Final  . BUN 52/84/1324 19  6 - 23 mg/dL Final  . Creatinine, Ser 09/27/2013 0.76  0.50 - 1.10 mg/dL Final  . Calcium 40/07/2724 9.3  8.4 - 10.5 mg/dL Final  . Total Protein 09/27/2013 6.8  6.0 - 8.3 g/dL Final  . Albumin 36/64/4034 3.8  3.5 - 5.2 g/dL Final  . AST 74/25/9563 52* 0 - 37 U/L Final  . ALT 09/27/2013 79* 0 - 35 U/L Final  . Alkaline Phosphatase 09/27/2013 64  39 - 117 U/L Final  . Total Bilirubin 09/27/2013 0.6  0.3 - 1.2 mg/dL Final  . GFR calc non  Af Amer 09/27/2013 >90  >90 mL/min Final  . GFR calc Af Amer 09/27/2013 >90  >90 mL/min Final   Comment: (NOTE)                          The eGFR has been calculated using the CKD EPI equation.                          This calculation has not been validated in all clinical situations.                          eGFR's persistently <90 mL/min signify possible Chronic Kidney                          Disease.  Marland Kitchen Prothrombin Time 09/27/2013 12.6  11.6 - 15.2 seconds Final  . INR 09/27/2013 0.96  0.00 - 1.49 Final  . Color, Urine 09/27/2013 YELLOW  YELLOW Final  . APPearance 09/27/2013 CLEAR  CLEAR Final  . Specific Gravity, Urine 09/27/2013 1.019  1.005 - 1.030 Final  . pH 09/27/2013 6.0  5.0 - 8.0 Final  . Glucose, UA 09/27/2013 NEGATIVE  NEGATIVE mg/dL Final  . Hgb urine dipstick 09/27/2013 SMALL* NEGATIVE Final  . Bilirubin Urine 09/27/2013 NEGATIVE  NEGATIVE Final  .  Ketones, ur 09/27/2013 NEGATIVE  NEGATIVE mg/dL Final  . Protein, ur 16/07/9603 NEGATIVE  NEGATIVE mg/dL Final  . Urobilinogen, UA 09/27/2013 0.2  0.0 - 1.0 mg/dL Final  . Nitrite 54/06/8118 NEGATIVE  NEGATIVE Final  . Leukocytes, UA 09/27/2013 NEGATIVE  NEGATIVE Final  . MRSA, PCR 09/27/2013 NEGATIVE  NEGATIVE Final  . Staphylococcus aureus 09/27/2013 NEGATIVE  NEGATIVE Final   Comment:                                 The Xpert SA Assay (FDA                          approved for NASAL specimens                          in patients over 101 years of age),                          is one component of                          a comprehensive surveillance                          program.  Test performance has                          been validated by [employer] for patients greater                          than or equal to 69 year old.  It is not intended                          to diagnose infection nor to                          guide or monitor treatment.  . Squamous Epithelial / LPF 09/27/2013 FEW* RARE Final  . WBC, UA 09/27/2013 0-2  <3 WBC/hpf Final  . RBC / HPF 09/27/2013 0-2  <3 RBC/hpf Final  . Bacteria, UA 09/27/2013 RARE  RARE Final  . Urine-Other 09/27/2013 MUCOUS PRESENT   Final  . WBC 09/30/2013 12.1* 4.0 - 10.5 K/uL Final  . RBC 09/30/2013 4.10  3.87 - 5.11 MIL/uL Final  . Hemoglobin 09/30/2013 12.5  12.0 - 15.0 g/dL Final  . HCT 10/13/7251 37.3  36.0 - 46.0 % Final  . MCV 09/30/2013 91.0  78.0 - 100.0 fL Final  . MCH 09/30/2013 30.5  26.0 - 34.0 pg Final  . MCHC 09/30/2013 33.5  30.0 - 36.0 g/dL Final  . RDW 66/44/0347 12.2  11.5 - 15.5 % Final  . Platelets 09/30/2013 194  150 - 400 K/uL Final  . Neutrophils Relative % 09/30/2013 58  43 - 77 % Final  . Neutro Abs 09/30/2013 7.0  1.7 - 7.7 K/uL Final  . Lymphocytes Relative 09/30/2013 30  12 - 46 % Final  . Lymphs Abs 09/30/2013 3.6  0.7 - 4.0 K/uL Final  .  Monocytes Relative 09/30/2013 10  3 - 12 % Final  . Monocytes Absolute 09/30/2013 1.3* 0.1 - 1.0 K/uL Final  . Eosinophils Relative 09/30/2013 1  0 - 5 % Final  . Eosinophils Absolute 09/30/2013 0.2  0.0 - 0.7 K/uL Final  . Basophils Relative 09/30/2013 0  0 - 1 % Final  . Basophils Absolute 09/30/2013 0.0  0.0 - 0.1 K/uL Final  Appointment on 08/29/2013  Component Date Value Range Status  . Vitamin B-12 08/29/2013 550  211 - 911 pg/mL Final  . Methylmalonic Acid, Quant 08/29/2013 0.12  <0.40 umol/L Final  . ANA 08/29/2013 NEG  NEGATIVE Final  . Total Protein, Serum Electrophores* 08/29/2013 6.1  6.0 - 8.3 g/dL Final  . Albumin ELP 42/59/5638 62.7  55.8 - 66.1 % Final  . Alpha-1-Globulin 08/29/2013 4.3  2.9 - 4.9 % Final  . Alpha-2-Globulin 08/29/2013 10.9  7.1 - 11.8 % Final  . Beta Globulin 08/29/2013 5.8  4.7 - 7.2 % Final  . Beta 2 08/29/2013 4.5  3.2 - 6.5 % Final  . Gamma Globulin 08/29/2013 11.8  11.1 - 18.8 % Final  . M-Spike, % 08/29/2013 NOT DET   Final  . SPE Interp. 08/29/2013    Final   Comment: Normal pattern.                          Reviewed by Dallas Breeding, MD, PhD, FCAP (Electronic Signature on                          File)  . COMMENT (PROTEIN ELECTROPHOR) 08/29/2013    Final   Comment: ---------------                          Serum protein electrophoresis is a useful screening procedure in the  detection of various pathophysiologic states such as inflammation,                          gammopathies, protein loss and other dysproteinemias.  Immunofixation                          electrophoresis (IFE) is a more sensitive technique for the                          identification of M-proteins found in patients with monoclonal                          gammopathy of unknown significance (MGUS), amyloidosis, early or                          treated myeloma or macroglobulinemia, solitary plasmacytoma or                           extramedullary plasmacytoma.  . B burgdorferi Ab IgG+IgM 08/29/2013 0.49   Final   Comment: Antibody to Borrelia burgdorferi not detected.                                                        ISR = Immune Status Ratio                                       <0.90         ISR       Negative                                       0.90 - 1.09   ISR       Equivocal                                       >=1.10        ISR       Positive  . Rheumatoid Factor 08/29/2013 <10  <=14 IU/mL Final   Comment:                                                     Interpretive Table                                              Low Positive: 15 - 41 IU/mL  High Positive:  >= 42 IU/mL                                                      In addition to the RF result, and clinical symptoms including joint                           involvement, the 2010 ACR Classification Criteria for                           scoring/diagnosing Rheumatoid Arthritis include the results of the                           following tests:  CRP ([address]), ESR (15010), and CCP (APCA) (60454).                           www.rheumatology.org/practice/clinical/classification/ra/ra_2010.asp  . Sed Rate 08/29/2013 5  0 - 22 mm/hr Final  . TSH 08/29/2013 0.68  0.35 - 5.50 uIU/mL Final     X-Rays:Dg Lumbar Spine 2-3 Views  09/27/2013   CLINICAL DATA:  Lumbar disc protrusion.  EXAM: LUMBAR SPINE - 2-3 VIEW  COMPARISON:  MRI dated 09/24/2013  FINDINGS: There is marked narrowing of the L5-S1 disc space. The remainder of the lumbar spine is normal.  IMPRESSION: Degenerative disc disease at L5-S1. Vertebra are labeled for intraoperative comparison.   Electronically Signed   By: Geanie Cooley M.D.   On: 09/27/2013 14:14   Dg Spine Portable 1 View  09/28/2013   CLINICAL DATA:  Intraop hemi-microdiscectomy lumbar laminectomy L5-S1  EXAM: PORTABLE SPINE - 1 VIEW  COMPARISON:  09/28/2013 at 2000 hr  FINDINGS:  Single lateral radiograph demonstrates surgical hardware at L5-S1.  IMPRESSION: Intraoperative localization as above.   Electronically Signed   By: Charline Bills M.D.   On: 09/28/2013 20:45   Dg Spine Portable 1 View  09/28/2013   CLINICAL DATA:  Lumbar surgery  EXAM: PORTABLE SPINE - 1 VIEW  COMPARISON:  09/27/2013  FINDINGS: Single lateral intraoperative lumbar radiograph demonstrates posterior surgical instruments, projecting on and inferior to the L5 spinous process.  IMPRESSION: Intraoperative localization.   Electronically Signed   By: Oley Balm M.D.   On: 09/28/2013 20:15    EKG: Orders placed during the hospital encounter of 02/28/13  . EKG 12-LEAD  . EKG 12-LEAD  . EKG     Hospital Course: Abigail [name] is a 47 y.o. who was admitted to [facility]. They were brought to the operating room on 09/27/2013 - 09/28/2013 and underwent Procedure(s): HEMI-MICRODISCECTOMY LUMBAR LAMINECTOMY L5-S1 LEFT.  Patient tolerated the procedure well and was later transferred to the recovery room and then to the orthopaedic floor for postoperative care.  They were given PO and IV analgesics for pain control following their surgery.  They were given 24 hours of postoperative antibiotics of  Anti-infectives   Start     Dose/Rate Route Frequency Ordered Stop   09/28/13 2300  ceFAZolin (ANCEF) IVPB 1 g/50 mL premix     1 g 100 mL/hr over 30 Minutes Intravenous Every 8 hours 09/28/13 2242 09/29/13 1501   09/28/13 2109  polymyxin B 500,000 Units,  bacitracin 50,000 Units in sodium chloride irrigation 0.9 % 500 mL irrigation  Status:  Discontinued       As needed 09/28/13 2109 09/28/13 2122     and started on DVT prophylaxis in the form of Aspirin.   PT was ordered for gait training.  Discharge planning consulted to help with postop disposition and equipment needs.  Patient had a difficult night on the evening of surgery due to pain.  They started to get up OOB with therapy on day one.  Continued to work with therapy into day two.  Dressing was changed on day two and the incision was clean and dry. Patient was seen in rounds and was ready to go home.   Discharge Medications: Prior to Admission medications   Medication Sig Start Date End Date Taking? Authorizing Provider  ALPRAZolam Prudy Feeler) 0.5 MG tablet Take 0.5 mg by mouth 4 (four) times daily as needed for anxiety.    Yes Historical Provider, MD  amLODipine-olmesartan (AZOR) 5-20 MG per tablet Take 1 tablet by mouth daily before breakfast.   Yes Historical Provider, MD  gabapentin (NEURONTIN) 300 MG capsule Take 300 mg by mouth 3 (three) times daily. 300mg  in AM, 300mg  at lunch and 600mg  in PM   Yes Historical Provider, MD  metoprolol succinate (TOPROL-XL) 50 MG 24 hr tablet Take 25 mg by mouth 2 (two) times daily. Take with or immediately following a meal.   Yes Historical Provider, MD  PARoxetine (PAXIL) 20 MG tablet Take 20 mg by mouth every morning.   Yes Historical Provider, MD  cyclobenzaprine (FLEXERIL) 10 MG tablet Take 1 tablet (10 mg total) by mouth 3 (three) times daily as needed for muscle spasms. 09/28/13   Latangela Mccomas Tamala Ser, PA-C  oxyCODONE (OXY IR/ROXICODONE) 5 MG immediate release tablet Take 1-3 tablets (5-15 mg total) by mouth every 3 (three) hours as needed for moderate pain. 09/28/13   Arney Mayabb Tamala Ser, PA-C    Diet: Regular diet Activity:WBAT Follow-up:in 2 weeks Disposition - Home Discharged Condition: stable   Discharge Orders   Future Orders Complete By Expires   Call MD / Call 911  As directed    Comments:     If you experience chest pain or shortness of breath, CALL 911 and be transported to the hospital emergency room.  If you develope a fever above 101 F, pus (white drainage) or increased drainage or redness at the wound, or calf pain, call your surgeon's office.   Constipation Prevention  As directed    Comments:     Drink plenty of fluids.  Prune juice may be helpful.  You may  use a stool softener, such as Colace (over the counter) 100 mg twice a day.  Use MiraLax (over the counter) for constipation as needed.   Diet general  As directed    Discharge instructions  As directed    Comments:     You may shower starting Sunday For the first few days, remove your dressing, tape a piece of saran wrap over your incision, take your shower, then remove the saran wrap and put a clean dressing on. After two days you can shower without the saran wrap.  Change your dressing daily. Shower only, no tub bath. Call if any temperatures greater than 101 or any wound complications: [phone] during the day and ask for Dr. Jeannetta Ellis nurse, [facility] [place].   Driving restrictions  As directed    Comments:     No driving for 1 week or  while on pain medications   Increase activity slowly as tolerated  As directed    Lifting restrictions  As directed    Comments:     No lifting                       Follow-up Information   Follow up with GIOFFRE,RONALD A, MD. Schedule an appointment as soon as possible for a visit in 2 weeks.   Specialty:  Orthopedic Surgery   Contact information:   [address] [place] [address] [phone]       Follow up with GIOFFRE,RONALD A, MD. Schedule an appointment as soon as possible for a visit in 2 weeks.   Specialty:  Orthopedic Surgery   Contact information:   [address] [place] [address] [phone]       Signed: Kerby Nora 09/30/2013, 8:29 PM

## 2013-09-30 NOTE — Op Note (Signed)
Abigail [name], Abigail [name]              ACCOUNT NO.:  [id number]  MEDICAL RECORD NO.:  [id number]  LOCATION:  1605                         FACILITY:  [facility]  PHYSICIAN:  Georges Lynch. Anissia Wessells, M.D.DATE OF BIRTH:  08-25-1966  DATE OF PROCEDURE:  09/27/2013 DATE OF DISCHARGE:                              OPERATIVE REPORT   SURGEON:  Georges Lynch. Darrelyn Hillock, M.D.  ASSISTANT:  Marlowe Kays, M.D.  PREOPERATIVE DIAGNOSES: 1. Large herniated lumbar disk at L5-S1 on the left with left leg pain     only. 2. Lateral recess stenosis secondary to degenerative disk disease at     L5-S1. 3. She is postop hemilaminectomy microdiskectomy x2 at L5-S1 on the     opposite right side.  POSTOPERATIVE DIAGNOSES: 1. Large herniated lumbar disk at L5-S1 on the left with left leg pain     only. 2. Lateral recess stenosis secondary to degenerative disk disease at     L5-S1. 3. She is postop hemilaminectomy microdiskectomy x2 at L5-S1 on the     opposite right side.  OPERATION: 1. Hemilaminectomy and microdiskectomy at L5-S1 on the left. 2. Decompression of the lateral recess at L5-S1 on the left for severe     stenosis secondary to collapse of the L5-S1 disk space. 3. Foraminotomies for the L5 and the S1 roots on the left secondary to     the above. 4. Microdiskectomy.  PROCEDURE:  Under general anesthesia, routine orthopedic prep and drape of the lower back was carried out with the patient on spinal frame.  She had 2 g of IV Ancef.  Appropriate time-out was first carried out.  I also marked the appropriate left side of the back in the holding area since she had left leg pain only.  2 g of IV Ancef were taken.  An incision was made over the old incision site at L5-S1.  Bleeders identified and cauterized.  The muscle was stripped from the lamina and spinous process.  At this time, great care was taken not to go deep on the right side where she had previous surgery.  An x-ray was taken to verify the position.   We then carried out our hemilaminectomy at L5-S1 on the left.  Great care was taken because of the scar tissue.  Scar tissue was a residual from the previous surgery on the opposite side. At this time, we then went down and brought the microscope in and then removed the ligamentum flavum in the usual fashion with great care taken to protect the underlying dura.  Following that, we noted there was a severe lateral recess stenosis from collapse of the disk space and we did not decompress the lateral recess first.  We went down, identified the S1 root, did a nice foraminotomy for the S1 root.  We went proximally and did the same for the L5 root.  At this time, needle was placed in the disk space and x-ray was taken to verify the position.  We then gently retracted the dura and the S1 root, made a cruciate incision in the posterior longitudinal ligament and because of the collapse of the disk space, the disk was mainly extruded in the subligamentous space.  We went subligamentous in all directions with a nerve hook and the Epstein curettes and with nerve hook and the Epstein curettes, we made multiple passes, and removed multiple pieces of disk material.  We went into the disk space as well with the mini pituitary.  We thoroughly decompressed the nerve and at the end of the procedure we noted there were just a large hump there that was mainly a spur from collapse of the L5-S1 space.  The nerve root now was freely movable with the [place]. The dura was freely movable.  We went out and used the hockey stick to get down the S1 foramina for the S1 root, the L5 foramina for the L5 root, they were now free.  Thoroughly irrigated out the area, loosely applied some thrombin-soaked Gelfoam.  I closed the wound in layers in usual fashion except I left a small distal deep and proximal part of the wound open for drainage purposes.  Subcu was closed with 0-Vicryl after I injected 20 mL of Exparel at the  end of procedure.  Note beginning of the procedure, I injected about 18 mL of Marcaine 0.5% with epinephrine. The wound then was closed with staples.  Sterile Neosporin dressing was applied.  The patient did have 2 g of IV Ancef.  During the procedure, I irrigated the wound out with antibiotic solution.  Sterile dressings were applied.  The patient left the operating room in a satisfactory condition.          ______________________________ Georges Lynch Darrelyn Hillock, M.D.     RAG/MEDQ  D:  09/28/2013  T:  09/30/2013  Job:  [id number]

## 2013-10-01 ENCOUNTER — Encounter (HOSPITAL_COMMUNITY): Payer: Self-pay | Admitting: Orthopedic Surgery

## 2013-10-01 ENCOUNTER — Ambulatory Visit: Payer: 59 | Admitting: Neurology

## 2014-06-02 ENCOUNTER — Observation Stay (HOSPITAL_COMMUNITY)
Admission: EM | Admit: 2014-06-02 | Discharge: 2014-06-03 | Disposition: A | Discharge status: Home / Self Care | Payer: 59 | Attending: Internal Medicine | Admitting: Internal Medicine

## 2014-06-02 ENCOUNTER — Encounter (HOSPITAL_COMMUNITY): Payer: Self-pay | Admitting: Emergency Medicine

## 2014-06-02 ENCOUNTER — Emergency Department (HOSPITAL_COMMUNITY): Payer: 59

## 2014-06-02 DIAGNOSIS — M48061 Spinal stenosis, lumbar region without neurogenic claudication: Secondary | ICD-10-CM | POA: Diagnosis present

## 2014-06-02 DIAGNOSIS — G459 Transient cerebral ischemic attack, unspecified: Secondary | ICD-10-CM

## 2014-06-02 DIAGNOSIS — Z7982 Long term (current) use of aspirin: Secondary | ICD-10-CM | POA: Diagnosis not present

## 2014-06-02 DIAGNOSIS — R51 Headache: Secondary | ICD-10-CM | POA: Insufficient documentation

## 2014-06-02 DIAGNOSIS — I10 Essential (primary) hypertension: Secondary | ICD-10-CM | POA: Diagnosis not present

## 2014-06-02 DIAGNOSIS — M5416 Radiculopathy, lumbar region: Secondary | ICD-10-CM

## 2014-06-02 DIAGNOSIS — G451 Carotid artery syndrome (hemispheric): Secondary | ICD-10-CM

## 2014-06-02 DIAGNOSIS — M5126 Other intervertebral disc displacement, lumbar region: Secondary | ICD-10-CM | POA: Diagnosis present

## 2014-06-02 DIAGNOSIS — G458 Other transient cerebral ischemic attacks and related syndromes: Secondary | ICD-10-CM

## 2014-06-02 DIAGNOSIS — R209 Unspecified disturbances of skin sensation: Principal | ICD-10-CM | POA: Insufficient documentation

## 2014-06-02 DIAGNOSIS — F17200 Nicotine dependence, unspecified, uncomplicated: Secondary | ICD-10-CM | POA: Insufficient documentation

## 2014-06-02 DIAGNOSIS — F411 Generalized anxiety disorder: Secondary | ICD-10-CM | POA: Diagnosis not present

## 2014-06-02 DIAGNOSIS — R202 Paresthesia of skin: Secondary | ICD-10-CM

## 2014-06-02 DIAGNOSIS — M5412 Radiculopathy, cervical region: Secondary | ICD-10-CM

## 2014-06-02 DIAGNOSIS — R208 Other disturbances of skin sensation: Secondary | ICD-10-CM

## 2014-06-02 LAB — COMPREHENSIVE METABOLIC PANEL
ALBUMIN: 3.7 g/dL (ref 3.5–5.2)
ALT: 9 U/L (ref 0–35)
ANION GAP: 13 (ref 5–15)
AST: 16 U/L (ref 0–37)
Alkaline Phosphatase: 56 U/L (ref 39–117)
BILIRUBIN TOTAL: 0.3 mg/dL (ref 0.3–1.2)
BUN: 17 mg/dL (ref 6–23)
CALCIUM: 9.2 mg/dL (ref 8.4–10.5)
CO2: 23 mEq/L (ref 19–32)
CREATININE: 0.78 mg/dL (ref 0.50–1.10)
Chloride: 104 mEq/L (ref 96–112)
GFR calc Af Amer: >90 mL/min (ref >90)
GFR calc non Af Amer: >90 mL/min (ref >90)
Glucose, Bld: 121 mg/dL — ABNORMAL HIGH (ref 70–99)
Potassium: 3.8 mEq/L (ref 3.7–5.3)
Sodium: 140 mEq/L (ref 137–147)
Total Protein: 6.9 g/dL (ref 6.0–8.3)

## 2014-06-02 LAB — CBC
HCT: 44.6 % (ref 36.0–46.0)
HEMOGLOBIN: 15.2 g/dL — AB (ref 12.0–15.0)
MCH: 30.6 pg (ref 26.0–34.0)
MCHC: 34.1 g/dL (ref 30.0–36.0)
MCV: 89.9 fL (ref 78.0–100.0)
Platelets: 290 10*3/uL (ref 150–400)
RBC: 4.96 MIL/uL (ref 3.87–5.11)
RDW: 12.4 % (ref 11.5–15.5)
WBC: 12.3 10*3/uL — ABNORMAL HIGH (ref 4.0–10.5)

## 2014-06-02 LAB — URINALYSIS, ROUTINE W REFLEX MICROSCOPIC
GLUCOSE, UA: NEGATIVE mg/dL
KETONES UR: NEGATIVE mg/dL
Leukocytes, UA: NEGATIVE
NITRITE: NEGATIVE
PH: 5 (ref 5.0–8.0)
Protein, ur: NEGATIVE mg/dL
SPECIFIC GRAVITY, URINE: 1.034 — AB (ref 1.005–1.030)
Urobilinogen, UA: 0.2 mg/dL (ref 0.0–1.0)

## 2014-06-02 LAB — RAPID URINE DRUG SCREEN, HOSP PERFORMED
Amphetamines: NOT DETECTED
BARBITURATES: NOT DETECTED
BENZODIAZEPINES: NOT DETECTED
Cocaine: NOT DETECTED
Opiates: NOT DETECTED
Tetrahydrocannabinol: POSITIVE — AB

## 2014-06-02 LAB — I-STAT CHEM 8, ED
BUN: 20 mg/dL (ref 6–23)
Calcium, Ion: 1.11 mmol/L — ABNORMAL LOW (ref 1.12–1.23)
Chloride: 105 mEq/L (ref 96–112)
Creatinine, Ser: 0.8 mg/dL (ref 0.50–1.10)
GLUCOSE: 116 mg/dL — AB (ref 70–99)
HEMATOCRIT: 47 % — AB (ref 36.0–46.0)
HEMOGLOBIN: 16 g/dL — AB (ref 12.0–15.0)
POTASSIUM: 3.7 meq/L (ref 3.7–5.3)
Sodium: 140 mEq/L (ref 137–147)
TCO2: 24 mmol/L (ref 0–100)

## 2014-06-02 LAB — DIFFERENTIAL
BASOS ABS: 0 10*3/uL (ref 0.0–0.1)
Basophils Relative: 0 % (ref 0–1)
EOS ABS: 0.2 10*3/uL (ref 0.0–0.7)
EOS PCT: 2 % (ref 0–5)
Lymphocytes Relative: 29 % (ref 12–46)
Lymphs Abs: 3.6 10*3/uL (ref 0.7–4.0)
MONO ABS: 0.9 10*3/uL (ref 0.1–1.0)
MONOS PCT: 8 % (ref 3–12)
NEUTROS ABS: 7.5 10*3/uL (ref 1.7–7.7)
Neutrophils Relative %: 61 % (ref 43–77)

## 2014-06-02 LAB — APTT: APTT: 34 s (ref 24–37)

## 2014-06-02 LAB — URINE MICROSCOPIC-ADD ON

## 2014-06-02 LAB — PROTIME-INR
INR: 1.01 (ref 0.00–1.49)
Prothrombin Time: 13.3 seconds (ref 11.6–15.2)

## 2014-06-02 LAB — ETHANOL: Alcohol, Ethyl (B): <11 mg/dL (ref 0–11)

## 2014-06-02 LAB — I-STAT TROPONIN, ED: TROPONIN I, POC: 0 ng/mL (ref 0.00–0.08)

## 2014-06-02 MED ORDER — METOPROLOL SUCCINATE ER 25 MG PO TB24
25.0000 mg | ORAL_TABLET | Freq: Two times a day (BID) | ORAL | Status: DC
  Administered 2014-06-03: 25 mg via ORAL
  Filled 2014-06-02: qty 1

## 2014-06-02 MED ORDER — PNEUMOCOCCAL VAC POLYVALENT 25 MCG/0.5ML IJ INJ: 0.5000 mL | INJECTION | INTRAMUSCULAR | Status: DC

## 2014-06-02 MED ORDER — DIPHENHYDRAMINE HCL 50 MG/ML IJ SOLN
12.5000 mg | Freq: Once | INTRAMUSCULAR | Status: AC
  Administered 2014-06-02: 12.5 mg via INTRAVENOUS
  Filled 2014-06-02: qty 1

## 2014-06-02 MED ORDER — PROCHLORPERAZINE EDISYLATE 5 MG/ML IJ SOLN
10.0000 mg | Freq: Once | INTRAMUSCULAR | Status: AC
  Administered 2014-06-02: 10 mg via INTRAVENOUS
  Filled 2014-06-02: qty 2

## 2014-06-02 MED ORDER — METHOCARBAMOL 500 MG PO TABS
500.0000 mg | ORAL_TABLET | Freq: Four times a day (QID) | ORAL | Status: DC | PRN
  Administered 2014-06-03: 500 mg via ORAL
  Filled 2014-06-02: qty 1

## 2014-06-02 MED ORDER — AMLODIPINE BESYLATE 5 MG PO TABS
5.0000 mg | ORAL_TABLET | Freq: Every day | ORAL | Status: DC
  Administered 2014-06-03: 5 mg via ORAL
  Filled 2014-06-02: qty 1

## 2014-06-02 MED ORDER — AMLODIPINE-OLMESARTAN 5-20 MG PO TABS: 1.0000 | ORAL_TABLET | Freq: Every day | ORAL | Status: DC

## 2014-06-02 MED ORDER — STROKE: EARLY STAGES OF RECOVERY BOOK
Freq: Once | Status: AC
Start: 2014-06-02 — End: 2014-06-02
  Administered 2014-06-02
  Filled 2014-06-02: qty 1

## 2014-06-02 MED ORDER — INDOMETHACIN 50 MG PO CAPS
50.0000 mg | ORAL_CAPSULE | Freq: Every day | ORAL | Status: DC
  Administered 2014-06-03: 50 mg via ORAL
  Filled 2014-06-02: qty 1

## 2014-06-02 MED ORDER — IRBESARTAN 150 MG PO TABS
150.0000 mg | ORAL_TABLET | Freq: Every day | ORAL | Status: DC
  Administered 2014-06-03: 150 mg via ORAL
  Filled 2014-06-02: qty 1

## 2014-06-02 MED ORDER — ALPRAZOLAM 0.5 MG PO TABS: 0.5000 mg | ORAL_TABLET | Freq: Four times a day (QID) | ORAL | Status: DC | PRN

## 2014-06-02 MED ORDER — PAROXETINE HCL 20 MG PO TABS
20.0000 mg | ORAL_TABLET | Freq: Every morning | ORAL | Status: DC
  Administered 2014-06-03: 20 mg via ORAL
  Filled 2014-06-02: qty 1

## 2014-06-02 MED ORDER — SODIUM CHLORIDE 0.9 % IV SOLN
INTRAVENOUS | Status: DC
Start: 2014-06-02 — End: 2014-06-03
  Administered 2014-06-02: 23:00:00 via INTRAVENOUS

## 2014-06-02 MED ORDER — GABAPENTIN 300 MG PO CAPS
300.0000 mg | ORAL_CAPSULE | Freq: Three times a day (TID) | ORAL | Status: DC
  Administered 2014-06-03: 300 mg via ORAL
  Filled 2014-06-02: qty 1

## 2014-06-02 MED ORDER — ACETAMINOPHEN 325 MG PO TABS: 650.0000 mg | ORAL_TABLET | ORAL | Status: DC | PRN

## 2014-06-02 MED ORDER — ASPIRIN 325 MG PO TABS
325.0000 mg | ORAL_TABLET | Freq: Every day | ORAL | Status: DC
  Administered 2014-06-03: 325 mg via ORAL
  Filled 2014-06-02: qty 1

## 2014-06-02 MED ORDER — HEPARIN SODIUM (PORCINE) 5000 UNIT/ML IJ SOLN
5000.0000 [IU] | Freq: Three times a day (TID) | INTRAMUSCULAR | Status: DC
  Administered 2014-06-03: 5000 [IU] via SUBCUTANEOUS
  Filled 2014-06-02: qty 1

## 2014-06-02 MED ORDER — MAGNESIUM SULFATE 40 MG/ML IJ SOLN
2.0000 g | Freq: Once | INTRAMUSCULAR | Status: AC
  Administered 2014-06-02: 2 g via INTRAVENOUS
  Filled 2014-06-02: qty 50

## 2014-06-02 NOTE — ED Notes (Signed)
She c/o numbness of entire right side for about 15 minutes earlier today, which has resolved.  She has just been seen by Dr. Stevie Kern.

## 2014-06-02 NOTE — ED Notes (Addendum)
[facility] cancelled, pt will be transported via [facility]. Pt cleared to transport via [facility] by Dr. Stevie Kern.

## 2014-06-02 NOTE — ED Notes (Signed)
Patient transported to CT 

## 2014-06-02 NOTE — Consult Note (Signed)
Neurology Consultation Reason for Consult: Transient right-sided numbness Referring Physician: Rebeca Alert.  CC: Right sided numbness  History is obtained from: Patient  HPI: Abigail [name] is a 48 y.o. female with a history of recurrent attacks of right sided tinglinn that have been happening for many months. She had attributed it in the past 2 cervical problems, but had an MRI without much in the way of stenosis in October of last year. Over the past 3 days, she has had right-sided numbness and tingling which started in her right shoulder and then spread gradually down her right arm and into her leg. Finally today, it also involved her face. It is persistent. She has a throbbing photophobic headache which is bifrontal in location. She often gets headaches following these spells.  She denies nausea or vomiting. She has 4-5 of these spells per month, but this one is particular severe.   ROS: A 14 point ROS was performed and is negative except as noted in the HPI.   Past Medical History  Diagnosis Date  . Hypertension   . Complication of anesthesia     headache when come out of anesthesia  . Headache(784.0)     occ. migraines, not frequent  . Arthritis     arthritis-back, weakness in right leg    Family History: No history of stroke  Social History: Tob: Current smoker  Exam: Current vital signs: BP 109/65  Pulse 59  Temp(Src) 98.9 F (37.2 C) (Oral)  Resp 24  SpO2 95% Vital signs in last 24 hours: Temp:  [98.9 F (37.2 C)-99.2 F (37.3 C)] 98.9 F (37.2 C) (08/23 1842) Pulse Rate:  [59-74] 59 (08/23 2100) Resp:  [15-24] 24 (08/23 2100) BP: (102-124)/(65-98) 109/65 mmHg (08/23 2100) SpO2:  [95 %-97 %] 95 % (08/23 2100)  General: In bed, NAD CV: Regular rate and rhythm Mental Status: Patient is awake, alert, oriented to person, place, month, year, and situation. Immediate and remote memory are intact. Patient is able to give a clear and coherent history. No signs  of aphasia or neglect Cranial Nerves: II: Visual Fields are full. Pupils are equal, round, and reactive to light.  Discs are difficult to visualize. III,IV, VI: EOMI without ptosis or diploplia.  V: Facial sensation is decreased on the right VII: Facial movement is symmetric.  VIII: hearing is intact to voice X: Uvula elevates symmetrically XI: Shoulder shrug is symmetric. XII: tongue is midline without atrophy or fasciculations.  Motor: Tone is normal. Bulk is normal. 5/5 strength was present on the left side, 4/5 strength in the right arm and leg Sensory: Sensation is diminished throughout the right side this is circumferential and not in a radicular distribution. Deep Tendon Reflexes: 2+ and symmetric in the biceps and patellae.  Plantars: Toes are downgoing bilaterally.  Cerebellar: FNF slightly slower on the right than left, but no clear ataxia Gait: Not tested secondary to weakness    I have reviewed labs in epic and the results pertinent to this consultation are: CMP-unremarkable  I have reviewed the images obtained: CT head-normal  Impression: 48 year old female with a history of intermittent paresthesias of the right side. The presence of positive symptoms (tingling), frequent association with photophobic headache, spread from one place to another overtime are all suggestive of complicated migraine as an etiology. With facial involvement, I do not think that cervical spine pathology is at all likely.   she does need an MRI to rule out ischemia, given persistent symptoms over the past  3 days if the MRI is negative then I do not think that stroke is likely culprit.  Recommendations: 1) MRI brain, no stroke workup unless this is positive for ischemia 2) treat for migraine with Compazine, Benadryl, magnesium   Roland Rack, MD [facility] [phone]  If 7pm- 7am, please page neurology on call as listed in [place].

## 2014-06-02 NOTE — ED Notes (Signed)
[facility] called for transport. 

## 2014-06-02 NOTE — ED Provider Notes (Signed)
CSN: [id number]     Arrival date & time 06/02/14  1731 History   First MD Initiated Contact with Patient 06/02/14 1817     Chief Complaint  Patient presents with  . Numbness   right-sided numbness   (Consider location/radiation/quality/duration/timing/severity/associated sxs/prior Treatment) HPI 48 year old female with a prior lumbar surgery for spinal stenosis December 2014 had resolution of the numbness in both legs at that time now presents with over 6 months of chronic severe constant neck pain radiating down her right arm to her hand with a few weeks of intermittent numbness and weakness to the right arm that lasts for several hours at a time she also has several weeks of recurrent low back pain radiating down her right leg with intermittent weakness numbness lasting several hours at a time with no change in bowel or bladder function however today she had an episode was last known at her baseline at 2:00 this afternoon when she had sudden onset of numbness to the entire right half of her body including her right side of her face entire right arm entire right leg and her right torso different than her chronic pain numbness sensations. The episode of new numbness to the entire right half of her body lasted 15 minutes or so and then completely resolved. She did not notice any new weakness of the time. She had no change in swallowing or vision and was not talking at the time. She had no vertigo or headache. There is no fever or trauma. She had no chest pain palpitations syncope lightheadedness nausea vomiting or other concerns. There is no treatment prior to arrival. Past Medical History  Diagnosis Date  . Hypertension   . Complication of anesthesia     headache when come out of anesthesia  . Headache(784.0)     occ. migraines, not frequent  . Arthritis     arthritis-back, weakness in right leg  . Syncope    Past Surgical History  Procedure Laterality Date  . Left foot bone spur surgery  10  yrs ago  . Abdominal hysterectomy  14 yrs ago  . Lumbar laminectomy/decompression microdiscectomy  07/13/2012    Procedure: LUMBAR LAMINECTOMY/DECOMPRESSION MICRODISCECTOMY 1 LEVEL;  Surgeon: Tobi Bastos, MD;  Location: [facility];  Service: Orthopedics;  Laterality: N/A;  Central Decompression Lumbar Laminectomy L5 - S1 (X-Ray)  . Lumbar laminectomy/decompression microdiscectomy Right 03/07/2013    Procedure: LUMBAR DECOMPRESSIVE HEMI LAMINECTOMY AND  MICRODISCECTOMY  L5-S1 RIGHT ;  Surgeon: Tobi Bastos, MD;  Location: [facility];  Service: Orthopedics;  Laterality: Right;  . Hemi-microdiscectomy lumbar laminectomy level 1 N/A 09/28/2013    Procedure: HEMI-MICRODISCECTOMY LUMBAR LAMINECTOMY L5-S1 LEFT;  Surgeon: Tobi Bastos, MD;  Location: [facility];  Service: Orthopedics;  Laterality: N/A;   Family History  Problem Relation Age of Onset  . Ataxia Neg Hx   . Chorea Neg Hx   . Dementia Neg Hx   . Mental retardation Neg Hx   . Migraines Neg Hx   . Multiple sclerosis Neg Hx   . Neurofibromatosis Neg Hx   . Neuropathy Neg Hx   . Parkinsonism Neg Hx   . Seizures Neg Hx   . Stroke Neg Hx    History  Substance Use Topics  . Smoking status: Current Every Day Smoker -- 0.50 packs/day for 21 years    Types: Cigarettes  . Smokeless tobacco: Never Used  . Alcohol Use: Yes     Comment: rare   OB History  Grav Para Term Preterm Abortions TAB SAB Ect Mult Living                 Review of Systems 10 Systems reviewed and are negative for acute change except as noted in the HPI.   Allergies  Review of patient's allergies indicates no known allergies.  Home Medications   Prior to Admission medications   Medication Sig Start Date End Date Taking? Authorizing Provider  ALPRAZolam Duanne Moron) 0.5 MG tablet Take 0.5 mg by mouth 4 (four) times daily as needed for anxiety.    Yes Historical Provider, MD  amLODipine-olmesartan (AZOR) 5-20 MG per tablet Take 1 tablet by mouth daily before  breakfast.   Yes Historical Provider, MD  aspirin 325 MG tablet Take 325 mg by mouth daily.   Yes Historical Provider, MD  gabapentin (NEURONTIN) 300 MG capsule Take 300 mg by mouth 3 (three) times daily.    Yes Historical Provider, MD  indomethacin (INDOCIN) 25 MG capsule Take 50 mg by mouth daily.   Yes Historical Provider, MD  metoprolol succinate (TOPROL-XL) 50 MG 24 hr tablet Take 25 mg by mouth 2 (two) times daily. Take with or immediately following a meal.   Yes Historical Provider, MD  PARoxetine (PAXIL) 20 MG tablet Take 20 mg by mouth every morning.   Yes Historical Provider, MD  methocarbamol (ROBAXIN) 500 MG tablet Take 1 tablet (500 mg total) by mouth every 6 (six) hours as needed for muscle spasms. 06/03/14   Hosie Poisson, MD  oxyCODONE-acetaminophen (PERCOCET) 5-325 MG per tablet Take 1 tablet by mouth every 4 (four) hours as needed. 06/07/14   Johnna Acosta, MD  traMADol-acetaminophen (ULTRACET) 37.5-325 MG per tablet  06/08/14   Historical Provider, MD   BP 115/72  Pulse 55  Temp(Src) 98.5 F (36.9 C) (Oral)  Resp 16  Ht 5\' 5"  (1.651 m)  Wt 145 lb (65.772 kg)  BMI 24.13 kg/m2  SpO2 97% Physical Exam  Nursing note and vitals reviewed. Constitutional:  Awake, alert, nontoxic appearance with baseline speech for patient.  HENT:  Head: Atraumatic.  Mouth/Throat: No oropharyngeal exudate.  Eyes: EOM are normal. Pupils are equal, round, and reactive to light. Right eye exhibits no discharge. Left eye exhibits no discharge.  Neck: Neck supple.  Diffuse posterior neck tenderness to the cervical spine and paracervical region and bilateral trapezius region  Cardiovascular: Normal rate and regular rhythm.   No murmur heard. Pulmonary/Chest: Effort normal and breath sounds normal. No stridor. No respiratory distress. She has no wheezes. She has no rales. She exhibits no tenderness.  Abdominal: Soft. Bowel sounds are normal. She exhibits no mass. There is no tenderness. There is no  rebound.  Musculoskeletal: She exhibits no tenderness.  Baseline ROM, moves extremities with no obvious new focal weakness. Diffuse lumbar and paralumbar tenderness. DP pulses intact both feet.  Lymphadenopathy:    She has no cervical adenopathy.  Neurological: She is alert.  Awake, alert, cooperative and aware of situation; motor strength 5/5 bilaterally; sensation normal light touch to both sides of her face currently with normal light touch to her left arm and left leg with baseline numbness to the right lateral thigh as well as baseline numbness to the entire right foot with right arm currently normal light touch and medial right thigh and lower leg with normal light touch; peripheral visual fields full to confrontation; no facial asymmetry; tongue midline; major cranial nerves appear intact; no pronator drift, normal finger to nose bilaterally, baseline  antalgic gait without new ataxia. Deep tendon reflexes symmetric knee jerk and ankle jerk bilaterally.  Skin: No rash noted.  Psychiatric: She has a normal mood and affect.    ED Course  Procedures (including critical care time) History concerning for TIA suspect unrelated to her chronic cervical radiculopathy and lumbar radiculopathy. Patient / Family / Caregiver informed of clinical course, understand medical decision-making process, and agree with plan. D/w [facility]; [facility] will transfer to [facility] for Obs and [facility] will consult at [place] Reviewed  CBC - Abnormal; Notable for the following:    WBC 12.3 (*)    Hemoglobin 15.2 (*)    All other components within normal limits  COMPREHENSIVE METABOLIC PANEL - Abnormal; Notable for the following:    Glucose, Bld 121 (*)    All other components within normal limits  URINE RAPID DRUG SCREEN (HOSP PERFORMED) - Abnormal; Notable for the following:    Tetrahydrocannabinol POSITIVE (*)    All other components within normal limits  URINALYSIS, ROUTINE W  REFLEX MICROSCOPIC - Abnormal; Notable for the following:    Color, Urine AMBER (*)    APPearance CLOUDY (*)    Specific Gravity, Urine 1.034 (*)    Hgb urine dipstick MODERATE (*)    Bilirubin Urine SMALL (*)    All other components within normal limits  CBC - Abnormal; Notable for the following:    WBC 11.4 (*)    All other components within normal limits  CREATININE, SERUM - Abnormal; Notable for the following:    GFR calc non Af Amer 85 (*)    All other components within normal limits  GLUCOSE, CAPILLARY - Abnormal; Notable for the following:    Glucose-Capillary 101 (*)    All other components within normal limits  I-STAT CHEM 8, ED - Abnormal; Notable for the following:    Glucose, Bld 116 (*)    Calcium, Ion 1.11 (*)    Hemoglobin 16.0 (*)    HCT 47.0 (*)    All other components within normal limits  ETHANOL  PROTIME-INR  APTT  DIFFERENTIAL  URINE MICROSCOPIC-ADD ON  I-STAT TROPOININ, ED    Imaging Review No results found.   EKG Interpretation   Date/Time:  Sunday June 02 2014 18:43:57 EDT Ventricular Rate:  65 PR Interval:  197 QRS Duration: 66 QT Interval:  377 QTC Calculation: 392 R Axis:   62 Text Interpretation:  Sinus rhythm Normal ECG No significant change since  last tracing Confirmed by [facility]  MD, Jenny Reichmann ([id number]) on 06/02/2014 6:49:19 PM      MDM   Final diagnoses:  Hemispheric carotid artery syndrome  Chronic cervical radiculopathy  Chronic lumbar radiculopathy    The patient appears reasonably stabilized for transfer considering the current resources, flow, and capabilities available in the ED at this time, and I doubt any other [facility] requiring further screening and/or treatment in the ED prior to transfer.    Babette Relic, MD 06/11/14 [phone]

## 2014-06-02 NOTE — ED Notes (Signed)
Pt is aware of the need for urine. 

## 2014-06-02 NOTE — H&P (Signed)
[facility] History and Physical  Abigail [name] ACZ:[id number] DOB: May 04, 1966 DOA: 06/02/2014  Referring physician: Riki Altes, MD PCP: Stephens Shire, MD   Chief Complaint: Numbness and weakness  HPI: [name] is a 48 y.o. female presents with numbness on the right side of her body. Patient states that she has longstanding history of spinal and neck problems for which she has had surgery in the past. Today she states that she noted some pain in her neck and this appeared to go to her arm on the right side. She then states that she had numbness in the right arm as well as numbness on her face a the right lower extremity. Patient states that she was sitting at the time. She did not feel weak or dizzy. She states that she did not feel as though she was going to pass out. Patient states that there has been some improvement now. She also notes that she has weakness on the right side. Patient states that she has no headache. She has no chest pain. She has no shortness of breath noted. She does have a history of hypertension and she also does smoke about a half pack a day. 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   Review of Systems:  Constitutional:  no Fevers, chills, ++fatigue.  HEENT:  No current headaches or Difficulty swallowing  Cardio-vascular:  No chest pain, Orthopnea, PND, swelling in lower extremities  GI:  No heartburn, indigestion, abdominal pain, nausea, vomiting, diarrhea  Resp:  No shortness of breath with exertion or at rest. No coughing up of blood  Skin:  no rash or lesions GU:  no dysuria, change in color of urine Musculoskeletal:  No joint pain or swelling. ++ back pain.  Psych:  No change in mood or affect. No memory loss.   Past Medical History  Diagnosis Date  . Hypertension   . Complication of anesthesia     headache when come out of anesthesia  . Headache(784.0)     occ. migraines, not frequent  .  Arthritis     arthritis-back, weakness in right leg   Past Surgical History  Procedure Laterality Date  . Left foot bone spur surgery  10 yrs ago  . Abdominal hysterectomy  14 yrs ago  . Lumbar laminectomy/decompression microdiscectomy  07/13/2012    Procedure: LUMBAR LAMINECTOMY/DECOMPRESSION MICRODISCECTOMY 1 LEVEL;  Surgeon: Tobi Bastos, MD;  Location: [facility];  Service: Orthopedics;  Laterality: N/A;  Central Decompression Lumbar Laminectomy L5 - S1 (X-Ray)  . Lumbar laminectomy/decompression microdiscectomy Right 03/07/2013    Procedure: LUMBAR DECOMPRESSIVE HEMI LAMINECTOMY AND  MICRODISCECTOMY  L5-S1 RIGHT ;  Surgeon: Tobi Bastos, MD;  Location: [facility];  Service: Orthopedics;  Laterality: Right;  . Hemi-microdiscectomy lumbar laminectomy level 1 N/A 09/28/2013    Procedure: HEMI-MICRODISCECTOMY LUMBAR LAMINECTOMY L5-S1 LEFT;  Surgeon: Tobi Bastos, MD;  Location: [facility];  Service: Orthopedics;  Laterality: N/A;   Social History:  reports that she has been smoking.  She has never used smokeless tobacco. She reports that she drinks alcohol. She reports that she does not use illicit drugs.  No Known Allergies  Family History  Problem Relation Age of Onset  . Ataxia Neg Hx   . Chorea Neg Hx   . Dementia Neg Hx   . Mental retardation Neg Hx   . Migraines Neg Hx   . Multiple sclerosis Neg Hx   . Neurofibromatosis Neg  Hx   . Neuropathy Neg Hx   . Parkinsonism Neg Hx   . Seizures Neg Hx   . Stroke Neg Hx      Prior to Admission medications   Medication Sig Start Date End Date Taking? Authorizing Provider  ALPRAZolam Duanne Moron) 0.5 MG tablet Take 0.5 mg by mouth 4 (four) times daily as needed for anxiety.    Yes Historical Provider, MD  amLODipine-olmesartan (AZOR) 5-20 MG per tablet Take 1 tablet by mouth daily before breakfast.   Yes Historical Provider, MD  aspirin 325 MG tablet Take 325 mg by mouth daily.   Yes Historical Provider, MD  gabapentin (NEURONTIN) 300 MG  capsule Take 300 mg by mouth 3 (three) times daily.    Yes Historical Provider, MD  indomethacin (INDOCIN) 25 MG capsule Take 50 mg by mouth daily.   Yes Historical Provider, MD  methocarbamol (ROBAXIN) 500 MG tablet Take 500 mg by mouth every 6 (six) hours as needed for muscle spasms.   Yes Historical Provider, MD  metoprolol succinate (TOPROL-XL) 50 MG 24 hr tablet Take 25 mg by mouth 2 (two) times daily. Take with or immediately following a meal.   Yes Historical Provider, MD  PARoxetine (PAXIL) 20 MG tablet Take 20 mg by mouth every morning.   Yes Historical Provider, MD   Physical Exam: Filed Vitals:   06/02/14 1842 06/02/14 1900 06/02/14 1930 06/02/14 2000  BP:  124/98 114/68 102/69  Pulse:  69 64 65  Temp: 98.9 F (37.2 C)     TempSrc:      Resp:  19 15 21   SpO2:  97% 97% 97%    Wt Readings from Last 3 Encounters:  09/28/13 63.504 kg (140 lb)  09/28/13 63.504 kg (140 lb)  09/20/13 63.504 kg (140 lb)    General:  Appears calm and comfortable Eyes: PERRL, normal lids, irises & conjunctiva ENT: grossly normal hearing, lips & tongue Neck: no LAD, masses or thyromegaly Cardiovascular: RRR, no m/r/g. No LE edema. Telemetry: SR, no arrhythmias  Respiratory: CTA bilaterally, no w/r/r. Normal respiratory effort. Abdomen: soft, ntnd Skin: no rash or induration seen on limited exam Musculoskeletal: grossly normal tone BUE/BLE Psychiatric: grossly flat affect, speech fluent and appropriate Neurologic: has significant weakness noted on the entire right side both upper and lower extremities          Labs on Admission:  Basic Metabolic Panel:  Recent Labs Lab 06/02/14 1845 06/02/14 1902  NA 140 140  K 3.8 3.7  CL 104 105  CO2 23  --   GLUCOSE 121* 116*  BUN 17 20  CREATININE 0.78 0.80  CALCIUM 9.2  --    Liver Function Tests:  Recent Labs Lab 06/02/14 1845  AST 16  ALT 9  ALKPHOS 56  BILITOT 0.3  PROT 6.9  ALBUMIN 3.7   No results found for this basename:  LIPASE, AMYLASE,  in the last 168 hours No results found for this basename: AMMONIA,  in the last 168 hours CBC:  Recent Labs Lab 06/02/14 1845 06/02/14 1902  WBC 12.3*  --   NEUTROABS 7.5  --   HGB 15.2* 16.0*  HCT 44.6 47.0*  MCV 89.9  --   PLT 290  --    Cardiac Enzymes: No results found for this basename: CKTOTAL, CKMB, CKMBINDEX, TROPONINI,  in the last 168 hours  BNP (last 3 results) No results found for this basename: PROBNP,  in the last 8760 hours CBG: No results found for  this basename: GLUCAP,  in the last 168 hours  Radiological Exams on Admission: Ct Head Wo Contrast  06/02/2014   CLINICAL DATA:  Transient right body numbness today.  EXAM: CT HEAD WITHOUT CONTRAST  TECHNIQUE: Contiguous axial images were obtained from the base of the skull through the vertex without intravenous contrast.  COMPARISON:  Brain MR dated 08/04/2013.  FINDINGS: Normal appearing cerebral hemispheres and posterior fossa structures. Normal size and position of the ventricles. No intracranial hemorrhage, mass lesion or CT evidence of acute infarction. Unremarkable bones and included paranasal sinuses.  IMPRESSION: Normal examination.   Electronically Signed   By: Enrique Sack M.D.   On: 06/02/2014 19:15    EKG: Independently reviewed. NSR  Assessment/Plan Active Problems:   Spinal stenosis, lumbar   Recurrent herniation of lumbar disc   TIA (transient ischemic attack)   Hypertension   1. TIA -symptoms are atypical for her so will need admission for observation -will transfer to [facility] for neurology evaluation -will get carotid and echo  2. Hypertension -will continue with home medications -currently pressures are under control  3. Spina Stenosis -will continue with pain control -the chronic discomfort is different from her current symptoms  4. Anxiety disorder -will continue with anxiolytics    Code Status: Full Code (must indicate code status--if unknown or must be presumed,  indicate so) DVT Prophylaxis:heparin Family Communication: None (indicate person spoken with, if applicable, with phone number if by telephone) Disposition Plan: Home (indicate anticipated LOS)  Time spent: 59min  KHAN,SAADAT A [facility] Pager [phone]  **Disclaimer: This note may have been dictated with voice recognition software. Similar sounding words can inadvertently be transcribed and this note may contain transcription errors which may not have been corrected upon publication of note.**

## 2014-06-02 NOTE — Progress Notes (Signed)
Pt admitted from [facility], c/o of pain in legs,pt reassured and settled in bed,telemetry monitor in place,call light at bedside,will continue to monitor. [name], Abigail [name]

## 2014-06-03 ENCOUNTER — Encounter (HOSPITAL_COMMUNITY): Payer: Self-pay | Admitting: *Deleted

## 2014-06-03 ENCOUNTER — Observation Stay (HOSPITAL_COMMUNITY): Payer: 59

## 2014-06-03 DIAGNOSIS — I10 Essential (primary) hypertension: Secondary | ICD-10-CM

## 2014-06-03 DIAGNOSIS — M5412 Radiculopathy, cervical region: Secondary | ICD-10-CM

## 2014-06-03 LAB — CBC
HCT: 42.1 % (ref 36.0–46.0)
Hemoglobin: 14.4 g/dL (ref 12.0–15.0)
MCH: 30.8 pg (ref 26.0–34.0)
MCHC: 34.2 g/dL (ref 30.0–36.0)
MCV: 90 fL (ref 78.0–100.0)
PLATELETS: 239 10*3/uL (ref 150–400)
RBC: 4.68 MIL/uL (ref 3.87–5.11)
RDW: 12.5 % (ref 11.5–15.5)
WBC: 11.4 10*3/uL — AB (ref 4.0–10.5)

## 2014-06-03 LAB — CREATININE, SERUM
Creatinine, Ser: 0.82 mg/dL (ref 0.50–1.10)
GFR calc non Af Amer: 85 mL/min — ABNORMAL LOW (ref >90)

## 2014-06-03 LAB — GLUCOSE, CAPILLARY: Glucose-Capillary: 101 mg/dL — ABNORMAL HIGH (ref 70–99)

## 2014-06-03 MED ORDER — METHOCARBAMOL 500 MG PO TABS: 500.0000 mg | ORAL_TABLET | Freq: Four times a day (QID) | ORAL | Status: DC | PRN

## 2014-06-03 NOTE — Progress Notes (Signed)
Discharge orders received. Pt for discharge home today. IV d/c'd. Pt given discharge instructions and prescriptions with verbalized understanding. Family in room to assist with discharge. Staff brought pt downstairs via wheelchair.   

## 2014-06-03 NOTE — Progress Notes (Signed)
PT Cancellation Note  Patient Details Name: Abigail [name] MRN: [id number] DOB: [birth year]   Cancelled Treatment:     Patient discharged prior to PTs arrival.    Melvern Banker 06/03/2014, 3:17 PM

## 2014-06-03 NOTE — Progress Notes (Signed)
UR completed 

## 2014-06-03 NOTE — Progress Notes (Signed)
Subjective: patient has a 2/10 HA with only other symptom being a heaviness in her right shoulder.   Objective: Current vital signs: BP 115/72  Pulse 55  Temp(Src) 98.5 F (36.9 C) (Oral)  Resp 16  Ht 5\' 5"  (1.651 m)  Wt 65.772 kg (145 lb)  BMI 24.13 kg/m2  SpO2 97% Vital signs in last 24 hours: Temp:  [98.1 F (36.7 C)-99.2 F (37.3 C)] 98.5 F (36.9 C) (08/24 0912) Pulse Rate:  [50-74] 55 (08/24 0912) Resp:  [15-24] 16 (08/24 0513) BP: (102-124)/(46-98) 115/72 mmHg (08/24 0912) SpO2:  [95 %-98 %] 97 % (08/24 0912) Weight:  [65.772 kg (145 lb)] 65.772 kg (145 lb) (08/23 2209)  Intake/Output from previous day:   Intake/Output this shift: Total I/O In: 240 [P.O.:240] Out: -  Nutritional status: Cardiac  Neurologic Exam: General: NAD, laughing at jokes  Mental Status: Alert, oriented, thought content appropriate.  Speech fluent without evidence of aphasia.  Able to follow 3 step commands without difficulty. Cranial Nerves: II: Visual fields grossly normal, pupils equal, round, reactive to light and accommodation III,IV, VI: ptosis not present, extra-ocular motions intact bilaterally V,VII: smile symmetric, facial light touch sensation and vibration decreased on the left splitting midline VIII: hearing normal bilaterally IX,X: gag reflex present XI: bilateral shoulder shrug XII: midline tongue extension without atrophy or fasciculations  Motor: Right : Upper extremity   5/5    Left:     Upper extremity   5/5  Lower extremity   5/5     Lower extremity   5/5 Tone and bulk:normal tone throughout; no atrophy noted Sensory: Pinprick and vibration decreased on the right splitting midline in sternum and torso Deep Tendon Reflexes:  Right: Upper Extremity   Left: Upper extremity   biceps (C-5 to C-6) 2/4   biceps (C-5 to C-6) 2/4 tricep (C7) 2/4    triceps (C7) 2/4 Brachioradialis (C6) 2/4  Brachioradialis (C6) 2/4  Lower Extremity Lower Extremity  quadriceps (L-2 to  L-4) 2/4   quadriceps (L-2 to L-4) 2/4 Achilles (S1) 2/4   Achilles (S1) 2/4  Plantars: Right: downgoing   Left: downgoing Cerebellar: normal finger-to-nose,  normal heel-to-shin test    Lab Results: Basic Metabolic Panel:  Recent Labs Lab 06/02/14 1845 06/02/14 1902 06/03/14 0052  NA 140 140  --   K 3.8 3.7  --   CL 104 105  --   CO2 23  --   --   GLUCOSE 121* 116*  --   BUN 17 20  --   CREATININE 0.78 0.80 0.82  CALCIUM 9.2  --   --     Liver Function Tests:  Recent Labs Lab 06/02/14 1845  AST 16  ALT 9  ALKPHOS 56  BILITOT 0.3  PROT 6.9  ALBUMIN 3.7   No results found for this basename: LIPASE, AMYLASE,  in the last 168 hours No results found for this basename: AMMONIA,  in the last 168 hours  CBC:  Recent Labs Lab 06/02/14 1845 06/02/14 1902 06/03/14 0052  WBC 12.3*  --  11.4*  NEUTROABS 7.5  --   --   HGB 15.2* 16.0* 14.4  HCT 44.6 47.0* 42.1  MCV 89.9  --  90.0  PLT 290  --  239    Cardiac Enzymes: No results found for this basename: CKTOTAL, CKMB, CKMBINDEX, TROPONINI,  in the last 168 hours  Lipid Panel: No results found for this basename: CHOL, TRIG, HDL, CHOLHDL, VLDL, LDLCALC,  in the last  168 hours  CBG: No results found for this basename: GLUCAP,  in the last 168 hours  Microbiology: Results for orders placed during the hospital encounter of 09/27/13  SURGICAL PCR SCREEN     Status: None   Collection Time    09/27/13  1:29 PM      Result Value Ref Range Status   MRSA, PCR NEGATIVE  NEGATIVE Final   Staphylococcus aureus NEGATIVE  NEGATIVE Final   Comment:            The Xpert SA Assay (FDA     approved for NASAL specimens     in patients over 42 years of age),     is one component of     a comprehensive surveillance     program.  Test performance has     been validated by [employer] for patients greater     than or equal to 1 year old.     It is not intended     to diagnose infection nor to     guide or monitor  treatment.    Coagulation Studies:  Recent Labs  06/02/14 1845  LABPROT 13.3  INR 1.01    Imaging: Ct Head Wo Contrast  06/02/2014   CLINICAL DATA:  Transient right body numbness today.  EXAM: CT HEAD WITHOUT CONTRAST  TECHNIQUE: Contiguous axial images were obtained from the base of the skull through the vertex without intravenous contrast.  COMPARISON:  Brain MR dated 08/04/2013.  FINDINGS: Normal appearing cerebral hemispheres and posterior fossa structures. Normal size and position of the ventricles. No intracranial hemorrhage, mass lesion or CT evidence of acute infarction. Unremarkable bones and included paranasal sinuses.  IMPRESSION: Normal examination.   Electronically Signed   By: Enrique Sack M.D.   On: 06/02/2014 19:15   Mr Brain Wo Contrast  06/03/2014   CLINICAL DATA:  Right sided tingling. Right-sided numbness. Some facial involvement. Headache.  EXAM: MRI HEAD WITHOUT CONTRAST  TECHNIQUE: Multiplanar, multiecho pulse sequences of the brain and surrounding structures were obtained without intravenous contrast.  COMPARISON:  Head CT 06/02/2014.  MRI 08/04/2013.  FINDINGS: No change since the previous study. Diffusion imaging does not show any acute or subacute infarction. The brainstem and cerebellum are normal. The right cerebral hemisphere is normal. There is 1 and possibly 2 punctate foci of FLAIR and T2 signal in the left frontoparietal subcortical white matter at the vertex. These are nonspecific and usually not of clinical relevance. The possibility of demyelinating disease is not excluded by imaging however. No new or progressive findings. No mass lesion, hemorrhage, hydrocephalus or extra-axial collection. No pituitary mass. No inflammatory sinus disease. No skull or skullbase lesion. Insignificant small nasopharyngeal cysts incidentally noted. Major vessels at the base of the brain show flow.  IMPRESSION: No acute finding. No change since 08/04/2013. The study is normal except  for 1 or 2 tiny foci of T2 and FLAIR signal in the subcortical white matter at the left frontoparietal vertex. Usually, the use are not significant. Given this history, the possibility of demyelinating disease is not excluded on the basis of the imaging however.   Electronically Signed   By: Nelson Chimes M.D.   On: 06/03/2014 10:21    Medications:  Scheduled: . amLODipine  5 mg Oral Daily  . aspirin  325 mg Oral Daily  . gabapentin  300 mg Oral TID  . heparin  5,000 Units Subcutaneous 3 times per day  .  indomethacin  50 mg Oral Daily  . irbesartan  150 mg Oral Daily  . metoprolol succinate  25 mg Oral BID  . PARoxetine  20 mg Oral q morning - 10a  . pneumococcal 23 valent vaccine  0.5 mL Intramuscular Tomorrow-1000    Assessment/Plan: 48 YO female with right sided sensation and mild HA. HA has improved significantly. Patient continues to have subjective decreased sensation on the right splitting midline from head, through sternum and abdomen. MRI finding not related  To patients symptoms and negative for acute CVA.   Recommend: 1) Treat HA symptomatically 2) PT 3) No further neurology recommendations.    Etta Quill PA-C [facility] [phone]  06/03/2014, 10:44 AM

## 2014-06-03 NOTE — Discharge Summary (Signed)
Physician Discharge Summary  Abigail [name] CXK:[id number] DOB: 18-Nov-1965 DOA: 06/02/2014  PCP: Stephens Shire, MD  Admit date: 06/02/2014 Discharge date: 06/03/2014  Time spent: 25 minutes  Recommendations for Outpatient Follow-up:  1. Follow up with PCP in one week.   Discharge Diagnoses:  Active Problems:   Spinal stenosis, lumbar   Recurrent herniation of lumbar disc   TIA (transient ischemic attack)   Hypertension   Paresthesias   Discharge Condition: improved.   Diet recommendation: low sodium diet  Filed Weights   06/02/14 2209  Weight: 65.772 kg (145 lb)    History of present illness:  Abigail [name] is a 48 y.o. female presents with numbness on the right side of her body. Patient states that she has longstanding history of spinal and neck problems for which she has had surgery in the past.  She comes in with persistent numbness on the right side and headache.    Hospital Course:  1. Numbness of the right side of the body and headache/ ? TIA symptoms: She was admitted to observation and transferred to [facility] for further recommendations by neurology.  Neurology suggested the MRI brain findings is not related to her symptoms. Her headache is resolved. She was discharged.  2. Hypertension -will continue with home medications  -currently pressures are under control  3. Spina Stenosis -will continue with pain control   4. Anxiety disorder -will continue with anxiolytics   Procedures:  MRI  Consultations:  neurology  Discharge Exam: Filed Vitals:   06/03/14 0912  BP: 115/72  Pulse: 55  Temp: 98.5 F (36.9 C)  Resp:     General: alert afebrile comfortable Cardiovascular: s1s2 Respiratory: ctab  Discharge Instructions You were cared for by a hospitalist during your hospital stay. If you have any questions about your discharge medications or the care you received while you were in the hospital after you are discharged, you can call the unit and  asked to speak with the hospitalist on call if the hospitalist that took care of you is not available. Once you are discharged, your primary care physician will handle any further medical issues. Please note that NO REFILLS for any discharge medications will be authorized once you are discharged, as it is imperative that you return to your primary care physician (or establish a relationship with a primary care physician if you do not have one) for your aftercare needs so that they can reassess your need for medications and monitor your lab values.  Discharge Instructions   Diet - low sodium heart healthy    Complete by:  As directed      Discharge instructions    Complete by:  As directed   Follow up with Dr Gladstone Lighter in one week.     goffre            Medication List         ALPRAZolam 0.5 MG tablet  Commonly known as:  XANAX  Take 0.5 mg by mouth 4 (four) times daily as needed for anxiety.     aspirin 325 MG tablet  Take 325 mg by mouth daily.     AZOR 5-20 MG per tablet  Generic drug:  amLODipine-olmesartan  Take 1 tablet by mouth daily before breakfast.     gabapentin 300 MG capsule  Commonly known as:  NEURONTIN  Take 300 mg by mouth 3 (three) times daily.     indomethacin 25 MG capsule  Commonly known as:  INDOCIN  Take  50 mg by mouth daily.     methocarbamol 500 MG tablet  Commonly known as:  ROBAXIN  Take 1 tablet (500 mg total) by mouth every 6 (six) hours as needed for muscle spasms.     metoprolol succinate 50 MG 24 hr tablet  Commonly known as:  TOPROL-XL  Take 25 mg by mouth 2 (two) times daily. Take with or immediately following a meal.     PARoxetine 20 MG tablet  Commonly known as:  PAXIL  Take 20 mg by mouth every morning.       No Known Allergies     Follow-up Information   Follow up with BURNETT,BRENT A, MD. Schedule an appointment as soon as possible for a visit in 1 week.   Specialty:  Family Medicine   Contact information:   [address] [place] [address] [phone]       Follow up with GIOFFRE,RONALD A, MD. Schedule an appointment as soon as possible for a visit in 1 week.   Specialty:  Orthopedic Surgery   Contact information:   [address] [place] [address] [phone]        The results of significant diagnostics from this hospitalization (including imaging, microbiology, ancillary and laboratory) are listed below for reference.    Significant Diagnostic Studies: Ct Head Wo Contrast  06/02/2014   CLINICAL DATA:  Transient right body numbness today.  EXAM: CT HEAD WITHOUT CONTRAST  TECHNIQUE: Contiguous axial images were obtained from the base of the skull through the vertex without intravenous contrast.  COMPARISON:  Brain MR dated 08/04/2013.  FINDINGS: Normal appearing cerebral hemispheres and posterior fossa structures. Normal size and position of the ventricles. No intracranial hemorrhage, mass lesion or CT evidence of acute infarction. Unremarkable bones and included paranasal sinuses.  IMPRESSION: Normal examination.   Electronically Signed   By: Enrique Sack M.D.   On: 06/02/2014 19:15   Mr Brain Wo Contrast  06/03/2014   CLINICAL DATA:  Right sided tingling. Right-sided numbness. Some facial involvement. Headache.  EXAM: MRI HEAD WITHOUT CONTRAST  TECHNIQUE: Multiplanar, multiecho pulse sequences of the brain and surrounding structures were obtained without intravenous contrast.  COMPARISON:  Head CT 06/02/2014.  MRI 08/04/2013.  FINDINGS: No change since the previous study. Diffusion imaging does not show any acute or subacute infarction. The brainstem and cerebellum are normal. The right cerebral hemisphere is normal. There is 1 and possibly 2 punctate foci of FLAIR and T2 signal in the left frontoparietal subcortical white matter at the vertex. These are nonspecific and usually not of clinical relevance. The possibility of demyelinating disease is not excluded by  imaging however. No new or progressive findings. No mass lesion, hemorrhage, hydrocephalus or extra-axial collection. No pituitary mass. No inflammatory sinus disease. No skull or skullbase lesion. Insignificant small nasopharyngeal cysts incidentally noted. Major vessels at the base of the brain show flow.  IMPRESSION: No acute finding. No change since 08/04/2013. The study is normal except for 1 or 2 tiny foci of T2 and FLAIR signal in the subcortical white matter at the left frontoparietal vertex. Usually, the use are not significant. Given this history, the possibility of demyelinating disease is not excluded on the basis of the imaging however.   Electronically Signed   By: Nelson Chimes M.D.   On: 06/03/2014 10:21    Microbiology: No results found for this or any previous visit (from the past 240 hour(s)).   Labs: Basic Metabolic  Panel:  Recent Labs Lab 06/02/14 1845 06/02/14 1902 06/03/14 0052  NA 140 140  --   K 3.8 3.7  --   CL 104 105  --   CO2 23  --   --   GLUCOSE 121* 116*  --   BUN 17 20  --   CREATININE 0.78 0.80 0.82  CALCIUM 9.2  --   --    Liver Function Tests:  Recent Labs Lab 06/02/14 1845  AST 16  ALT 9  ALKPHOS 56  BILITOT 0.3  PROT 6.9  ALBUMIN 3.7   No results found for this basename: LIPASE, AMYLASE,  in the last 168 hours No results found for this basename: AMMONIA,  in the last 168 hours CBC:  Recent Labs Lab 06/02/14 1845 06/02/14 1902 06/03/14 0052  WBC 12.3*  --  11.4*  NEUTROABS 7.5  --   --   HGB 15.2* 16.0* 14.4  HCT 44.6 47.0* 42.1  MCV 89.9  --  90.0  PLT 290  --  239   Cardiac Enzymes: No results found for this basename: CKTOTAL, CKMB, CKMBINDEX, TROPONINI,  in the last 168 hours BNP: BNP (last 3 results) No results found for this basename: PROBNP,  in the last 8760 hours CBG:  Recent Labs Lab 06/03/14 1131  GLUCAP 101*       Signed:  Carden Teel  [facility] 06/03/2014, 2:11 PM

## 2014-06-06 ENCOUNTER — Encounter (HOSPITAL_COMMUNITY): Payer: Self-pay | Admitting: Emergency Medicine

## 2014-06-06 ENCOUNTER — Emergency Department (HOSPITAL_COMMUNITY): Payer: 59

## 2014-06-06 ENCOUNTER — Emergency Department (HOSPITAL_COMMUNITY)
Admission: EM | Admit: 2014-06-06 | Discharge: 2014-06-07 | Disposition: A | Discharge status: Home / Self Care | Payer: 59 | Attending: Emergency Medicine | Admitting: Emergency Medicine

## 2014-06-06 DIAGNOSIS — G8929 Other chronic pain: Secondary | ICD-10-CM | POA: Diagnosis not present

## 2014-06-06 DIAGNOSIS — Z79899 Other long term (current) drug therapy: Secondary | ICD-10-CM | POA: Diagnosis not present

## 2014-06-06 DIAGNOSIS — Z7982 Long term (current) use of aspirin: Secondary | ICD-10-CM | POA: Diagnosis not present

## 2014-06-06 DIAGNOSIS — F17200 Nicotine dependence, unspecified, uncomplicated: Secondary | ICD-10-CM | POA: Diagnosis not present

## 2014-06-06 DIAGNOSIS — R404 Transient alteration of awareness: Secondary | ICD-10-CM | POA: Insufficient documentation

## 2014-06-06 DIAGNOSIS — Z9889 Other specified postprocedural states: Secondary | ICD-10-CM | POA: Diagnosis not present

## 2014-06-06 DIAGNOSIS — R51 Headache: Secondary | ICD-10-CM | POA: Diagnosis not present

## 2014-06-06 DIAGNOSIS — M542 Cervicalgia: Secondary | ICD-10-CM | POA: Diagnosis not present

## 2014-06-06 DIAGNOSIS — R55 Syncope and collapse: Secondary | ICD-10-CM

## 2014-06-06 DIAGNOSIS — M129 Arthropathy, unspecified: Secondary | ICD-10-CM | POA: Insufficient documentation

## 2014-06-06 DIAGNOSIS — M25519 Pain in unspecified shoulder: Secondary | ICD-10-CM | POA: Insufficient documentation

## 2014-06-06 DIAGNOSIS — I10 Essential (primary) hypertension: Secondary | ICD-10-CM | POA: Insufficient documentation

## 2014-06-06 HISTORY — DX: Syncope and collapse: R55

## 2014-06-06 LAB — URINALYSIS, ROUTINE W REFLEX MICROSCOPIC
Bilirubin Urine: NEGATIVE
GLUCOSE, UA: NEGATIVE mg/dL
KETONES UR: NEGATIVE mg/dL
LEUKOCYTES UA: NEGATIVE
NITRITE: NEGATIVE
Protein, ur: NEGATIVE mg/dL
Specific Gravity, Urine: 1.009 (ref 1.005–1.030)
Urobilinogen, UA: 0.2 mg/dL (ref 0.0–1.0)
pH: 5.5 (ref 5.0–8.0)

## 2014-06-06 LAB — URINE MICROSCOPIC-ADD ON

## 2014-06-06 MED ORDER — OXYCODONE-ACETAMINOPHEN 5-325 MG PO TABS
2.0000 | ORAL_TABLET | Freq: Once | ORAL | Status: AC
  Administered 2014-06-06: 2 via ORAL
  Filled 2014-06-06: qty 2

## 2014-06-06 NOTE — ED Provider Notes (Signed)
CSN: [id number]     Arrival date & time 06/06/14  2011 History   First MD Initiated Contact with Patient 06/06/14 2303     Chief Complaint  Patient presents with  . Neck Pain  . Loss of Consciousness    x2 today     (Consider location/radiation/quality/duration/timing/severity/associated sxs/prior Treatment) HPI Comments: 48 year old female, history of hypertension, history of complicated migraine with which she was recently admitted to the hospital in the last week and had a very thorough evaluation for possible stroke which was negative. She has been seeing Dr. Cay Schillings for the last month because of neck pain on the right, thought to be degenerative disc disease. She states that this evening and earlier in the day she had a syncopal episode, each of these occurred while standing, the second was witnessed by a family member who stated that she was unconscious for less than 10 seconds and returned to baseline immediately without seizure activity tongue biting or urinary incontinence. She denies any prodromal palpitations, headache, blurred vision or any other focal neurologic symptoms. At this time she complains of her neck hurting on the right side similar to the pain she has had for the last month. It was recommended that she wear a neck brace for the last month which he has not been using feeling like it has not been helping. She has followup with the orthopedist in 2 days. She has no history of heart disease, she does endorse not eating very much today and states that she only eats one meal a day and drinks very little. Today it was no different. She denies fevers, chills, sore throat, cough, shortness of breath, chest pain, numbness, weakness  Patient is a 48 y.o. female presenting with neck pain and syncope. The history is provided by the patient and medical records.  Neck Pain Associated symptoms: syncope   Loss of Consciousness   Past Medical History  Diagnosis Date  . Hypertension   .  Complication of anesthesia     headache when come out of anesthesia  . Headache(784.0)     occ. migraines, not frequent  . Arthritis     arthritis-back, weakness in right leg  . Syncope    Past Surgical History  Procedure Laterality Date  . Left foot bone spur surgery  10 yrs ago  . Abdominal hysterectomy  14 yrs ago  . Lumbar laminectomy/decompression microdiscectomy  07/13/2012    Procedure: LUMBAR LAMINECTOMY/DECOMPRESSION MICRODISCECTOMY 1 LEVEL;  Surgeon: Tobi Bastos, MD;  Location: [facility];  Service: Orthopedics;  Laterality: N/A;  Central Decompression Lumbar Laminectomy L5 - S1 (X-Ray)  . Lumbar laminectomy/decompression microdiscectomy Right 03/07/2013    Procedure: LUMBAR DECOMPRESSIVE HEMI LAMINECTOMY AND  MICRODISCECTOMY  L5-S1 RIGHT ;  Surgeon: Tobi Bastos, MD;  Location: [facility];  Service: Orthopedics;  Laterality: Right;  . Hemi-microdiscectomy lumbar laminectomy level 1 N/A 09/28/2013    Procedure: HEMI-MICRODISCECTOMY LUMBAR LAMINECTOMY L5-S1 LEFT;  Surgeon: Tobi Bastos, MD;  Location: [facility];  Service: Orthopedics;  Laterality: N/A;   Family History  Problem Relation Age of Onset  . Ataxia Neg Hx   . Chorea Neg Hx   . Dementia Neg Hx   . Mental retardation Neg Hx   . Migraines Neg Hx   . Multiple sclerosis Neg Hx   . Neurofibromatosis Neg Hx   . Neuropathy Neg Hx   . Parkinsonism Neg Hx   . Seizures Neg Hx   . Stroke Neg Hx    History  Substance Use Topics  . Smoking status: Current Every Day Smoker -- 0.50 packs/day for 21 years    Types: Cigarettes  . Smokeless tobacco: Never Used  . Alcohol Use: Yes     Comment: rare   OB History   Grav Para Term Preterm Abortions TAB SAB Ect Mult Living                 Review of Systems  Cardiovascular: Positive for syncope.  Musculoskeletal: Positive for neck pain.  All other systems reviewed and are negative.     Allergies  Review of patient's allergies indicates no known allergies.  Home  Medications   Prior to Admission medications   Medication Sig Start Date End Date Taking? Authorizing Provider  ALPRAZolam Duanne Moron) 0.5 MG tablet Take 0.5 mg by mouth 4 (four) times daily as needed for anxiety.    Yes Historical Provider, MD  amLODipine-olmesartan (AZOR) 5-20 MG per tablet Take 1 tablet by mouth daily before breakfast.   Yes Historical Provider, MD  aspirin 325 MG tablet Take 325 mg by mouth daily.   Yes Historical Provider, MD  gabapentin (NEURONTIN) 300 MG capsule Take 300 mg by mouth 3 (three) times daily.    Yes Historical Provider, MD  indomethacin (INDOCIN) 25 MG capsule Take 50 mg by mouth daily.   Yes Historical Provider, MD  methocarbamol (ROBAXIN) 500 MG tablet Take 1 tablet (500 mg total) by mouth every 6 (six) hours as needed for muscle spasms. 06/03/14  Yes Hosie Poisson, MD  metoprolol succinate (TOPROL-XL) 50 MG 24 hr tablet Take 25 mg by mouth 2 (two) times daily. Take with or immediately following a meal.   Yes Historical Provider, MD  PARoxetine (PAXIL) 20 MG tablet Take 20 mg by mouth every morning.   Yes Historical Provider, MD  oxyCODONE-acetaminophen (PERCOCET) 5-325 MG per tablet Take 1 tablet by mouth every 4 (four) hours as needed. 06/07/14   Johnna Acosta, MD   BP 130/90  Pulse 93  Temp(Src) 98.4 F (36.9 C) (Oral)  Resp 16  SpO2 94% Physical Exam  Nursing note and vitals reviewed. Constitutional: She appears well-developed and well-nourished. No distress.  HENT:  Head: Normocephalic and atraumatic.  Mouth/Throat: Oropharynx is clear and moist. No oropharyngeal exudate.  Eyes: Conjunctivae and EOM are normal. Pupils are equal, round, and reactive to light. Right eye exhibits no discharge. Left eye exhibits no discharge. No scleral icterus.  Neck: Normal range of motion. Neck supple. No JVD present. No thyromegaly present.  Cardiovascular: Normal rate, regular rhythm, normal heart sounds and intact distal pulses.  Exam reveals no gallop and no  friction rub.   No murmur heard. No carotid bruit auscultated  Pulmonary/Chest: Effort normal and breath sounds normal. No respiratory distress. She has no wheezes. She has no rales.  Abdominal: Soft. Bowel sounds are normal. She exhibits no distension and no mass. There is no tenderness.  Musculoskeletal: Normal range of motion. She exhibits tenderness (tenderness to the right neck, right trapezoid, right shoulder to palpation. No spinal tenderness). She exhibits no edema.  Lymphadenopathy:    She has no cervical adenopathy.  Neurological: She is alert. Coordination normal.  Normal speech, normal coordination, cranial nerves III through XII intact, moves all 4 extremities with normal strength though her right upper extremity is limited secondary to pain in the shoulder and the neck. Normal grips, normal sensation to light touch and pinprick, normal reflexes at the knees.  Skin: Skin is warm and dry. No  rash noted. No erythema.  Psychiatric: She has a normal mood and affect. Her behavior is normal.    ED Course  Procedures (including critical care time) Labs Review Labs Reviewed  URINALYSIS, ROUTINE W REFLEX MICROSCOPIC - Abnormal; Notable for the following:    APPearance CLOUDY (*)    Hgb urine dipstick TRACE (*)    All other components within normal limits  URINE MICROSCOPIC-ADD ON - Abnormal; Notable for the following:    Bacteria, UA FEW (*)    All other components within normal limits    Imaging Review Dg Cervical Spine Complete  06/06/2014   CLINICAL DATA:  Right-sided neck pain radiating to the shoulder arm for several weeks. Syncope with fall tonight. Increasing neck pain.  EXAM: CERVICAL SPINE  4+ VIEWS  COMPARISON:  MRI cervical spine 08/04/2013  FINDINGS: Slight anterior subluxation of C3 on C4 was present on previous MRI. Otherwise normal alignment of the cervical spine and facet joints. Degenerative changes throughout the facet joints. C1-2 articulation appears intact.  Intervertebral disc space heights are preserved. No vertebral compression deformities. No prevertebral soft tissue swelling. No focal bone lesion or bone destruction.  IMPRESSION: Slight anterior subluxation of C3 on C4 has been present on previous studies and is likely degenerative. Degenerative changes throughout the facet joints. No acute changes demonstrated.   Electronically Signed   By: Lucienne Capers M.D.   On: 06/06/2014 23:51     EKG Interpretation   Date/Time:  Thursday June 06 2014 21:04:04 EDT Ventricular Rate:  61 PR Interval:  185 QRS Duration: 75 QT Interval:  410 QTC Calculation: 413 R Axis:   76 Text Interpretation:  Normal sinus rhythm Normal ECG c/w 06/02/2014, no sig  changes seen Confirmed by Sabra Heck  MD, Wissam Resor ([id number]) on 06/06/2014 11:04:34  PM      MDM   Final diagnoses:  Neck pain  Syncope, unspecified syncope type    The patient has chronic neck pain, she has had 2 syncopal events today neither of which had any prodromal symptoms, her EKG shows no signs of acute abnormalities, at this time she requests cervical spine x-rays due to her falls and ongoing neck pain stating that this has not been evaluated with an x-ray yet. We'll also check a urinalysis to evaluate for hydration status, she will be given food and fluids and reevaluated after Percocet.  UA clean, imaging neg, ECG unremarkable.  Meds given in ED:  Medications  oxyCODONE-acetaminophen (PERCOCET/ROXICET) 5-325 MG per tablet 2 tablet (2 tablets Oral Given 06/06/14 2352)    New Prescriptions   OXYCODONE-ACETAMINOPHEN (PERCOCET) 5-325 MG PER TABLET    Take 1 tablet by mouth every 4 (four) hours as needed.        Johnna Acosta, MD 06/07/14 [phone]

## 2014-06-06 NOTE — ED Notes (Signed)
Patient states she is here for evaluation of neck pain. Patient states she was d/c from [facility] on 06/03/14 after evaluation for stroke. Patient reports a history of 3 back surgeries, prior to those surgeries she had a pinched never which caused her to have syncopal episodes. Patient reports 2 syncopal episodes today and believe she has a pinched nerve in her neck. Patient states she struck a table when she passed out the last time. Patient is accompanied by her husband at this time. Patient states she is seeking evaluation today for the neck pain, not the syncope.

## 2014-06-07 MED ORDER — OXYCODONE-ACETAMINOPHEN 5-325 MG PO TABS: 1.0000 | ORAL_TABLET | ORAL | Status: DC | PRN

## 2014-06-07 NOTE — Discharge Instructions (Signed)
Please call your doctor for a followup appointment within 24-48 hours. When you talk to your doctor please let them know that you were seen in the emergency department and have them acquire all of your records so that they can discuss the findings with you and formulate a treatment plan to fully care for your new and ongoing problems. ° °

## 2014-06-09 ENCOUNTER — Emergency Department (HOSPITAL_COMMUNITY)
Admission: EM | Admit: 2014-06-09 | Discharge: 2014-06-09 | Discharge status: Against Medical Advice | Payer: 59 | Attending: Emergency Medicine | Admitting: Emergency Medicine

## 2014-06-09 ENCOUNTER — Encounter (HOSPITAL_COMMUNITY): Payer: Self-pay | Admitting: Emergency Medicine

## 2014-06-09 DIAGNOSIS — M129 Arthropathy, unspecified: Secondary | ICD-10-CM | POA: Diagnosis not present

## 2014-06-09 DIAGNOSIS — Z7982 Long term (current) use of aspirin: Secondary | ICD-10-CM | POA: Insufficient documentation

## 2014-06-09 DIAGNOSIS — M5412 Radiculopathy, cervical region: Secondary | ICD-10-CM | POA: Diagnosis not present

## 2014-06-09 DIAGNOSIS — R55 Syncope and collapse: Secondary | ICD-10-CM | POA: Diagnosis not present

## 2014-06-09 DIAGNOSIS — I10 Essential (primary) hypertension: Secondary | ICD-10-CM | POA: Diagnosis not present

## 2014-06-09 DIAGNOSIS — Z79899 Other long term (current) drug therapy: Secondary | ICD-10-CM | POA: Insufficient documentation

## 2014-06-09 DIAGNOSIS — R5381 Other malaise: Secondary | ICD-10-CM

## 2014-06-09 DIAGNOSIS — IMO0002 Reserved for concepts with insufficient information to code with codable children: Secondary | ICD-10-CM | POA: Insufficient documentation

## 2014-06-09 DIAGNOSIS — M542 Cervicalgia: Secondary | ICD-10-CM | POA: Diagnosis not present

## 2014-06-09 DIAGNOSIS — R209 Unspecified disturbances of skin sensation: Secondary | ICD-10-CM

## 2014-06-09 DIAGNOSIS — F17200 Nicotine dependence, unspecified, uncomplicated: Secondary | ICD-10-CM | POA: Insufficient documentation

## 2014-06-09 DIAGNOSIS — M6281 Muscle weakness (generalized): Secondary | ICD-10-CM | POA: Diagnosis present

## 2014-06-09 DIAGNOSIS — R5383 Other fatigue: Secondary | ICD-10-CM

## 2014-06-09 DIAGNOSIS — M5410 Radiculopathy, site unspecified: Secondary | ICD-10-CM

## 2014-06-09 LAB — CBC WITH DIFFERENTIAL/PLATELET
BASOS PCT: 0 % (ref 0–1)
Basophils Absolute: 0 10*3/uL (ref 0.0–0.1)
EOS ABS: 0.1 10*3/uL (ref 0.0–0.7)
Eosinophils Relative: 1 % (ref 0–5)
HEMATOCRIT: 41.3 % (ref 36.0–46.0)
HEMOGLOBIN: 14.2 g/dL (ref 12.0–15.0)
Lymphocytes Relative: 30 % (ref 12–46)
Lymphs Abs: 3.4 10*3/uL (ref 0.7–4.0)
MCH: 31.1 pg (ref 26.0–34.0)
MCHC: 34.4 g/dL (ref 30.0–36.0)
MCV: 90.6 fL (ref 78.0–100.0)
MONO ABS: 0.8 10*3/uL (ref 0.1–1.0)
Monocytes Relative: 7 % (ref 3–12)
NEUTROS ABS: 7.2 10*3/uL (ref 1.7–7.7)
Neutrophils Relative %: 62 % (ref 43–77)
Platelets: 237 10*3/uL (ref 150–400)
RBC: 4.56 MIL/uL (ref 3.87–5.11)
RDW: 12.2 % (ref 11.5–15.5)
WBC: 11.6 10*3/uL — ABNORMAL HIGH (ref 4.0–10.5)

## 2014-06-09 LAB — COMPREHENSIVE METABOLIC PANEL
ALBUMIN: 3.2 g/dL — AB (ref 3.5–5.2)
ALT: 7 U/L (ref 0–35)
ANION GAP: 11 (ref 5–15)
AST: 11 U/L (ref 0–37)
Alkaline Phosphatase: 49 U/L (ref 39–117)
BUN: 19 mg/dL (ref 6–23)
CO2: 24 mEq/L (ref 19–32)
CREATININE: 0.78 mg/dL (ref 0.50–1.10)
Calcium: 8.9 mg/dL (ref 8.4–10.5)
Chloride: 102 mEq/L (ref 96–112)
GFR calc Af Amer: >90 mL/min (ref >90)
GFR calc non Af Amer: >90 mL/min (ref >90)
Glucose, Bld: 113 mg/dL — ABNORMAL HIGH (ref 70–99)
Potassium: 4.1 mEq/L (ref 3.7–5.3)
Sodium: 137 mEq/L (ref 137–147)
Total Protein: 5.9 g/dL — ABNORMAL LOW (ref 6.0–8.3)

## 2014-06-09 LAB — TROPONIN I: Troponin I: <0.3 ng/mL (ref <0.30)

## 2014-06-09 MED ORDER — HYDROMORPHONE HCL PF 1 MG/ML IJ SOLN: 1.0000 mg | Freq: Once | INTRAMUSCULAR | Status: DC

## 2014-06-09 MED ORDER — METHYLPREDNISOLONE SODIUM SUCC 125 MG IJ SOLR: 125.0000 mg | Freq: Once | INTRAMUSCULAR | Status: DC

## 2014-06-09 MED ORDER — ONDANSETRON HCL 4 MG/2ML IJ SOLN: 4.0000 mg | Freq: Once | INTRAMUSCULAR | Status: DC

## 2014-06-09 NOTE — ED Notes (Addendum)
PT. LEFT WITH HER HUSBAND WITHOUT WAITING FOR HER MEDICATIONS . EDP NOTIFIED .

## 2014-06-09 NOTE — ED Notes (Signed)
MD Polina at bedside.

## 2014-06-09 NOTE — ED Notes (Signed)
Patient here with complaint of near-syncope, right sided pain, and weakness. States that she had 2 syncopal episodes 4 days ago, fell to the floor, was awoken by her dog, had to crawl on floor until her husband arrived home. Was seen since that time and evaluated for stroke, which was negative. States that she feels faint even while sitting in chair. Endorses right side pain from right shoulder to foot. History of degenerative neck problems. Has been extensively evaluated per patient.

## 2014-06-09 NOTE — Consult Note (Signed)
NEURO HOSPITALIST CONSULT NOTE    Reason for Consult: left sided pain-numbness-weakness. Spells of passing out.  HPI:                                                                                                                                          Abigail [name] is an 48 y.o. female, left handed, with a past medical history significant for HTN, recurrent syncope, arthritis, s/p lumbar spine surgery x 3, and panic attacks, comes in today for further evaluation of the above stated symptoms. Her husband is at the bedside. She was recently seen by neurology on 8/23 due to " recurrent attacks of right sided tinglinn that have been happening for many months" and returns today expressing concerns regarding worsening pain-numbness-tingling-weakness in the left arm and leg. At that time MRI brain showed no acute intracranial abnormality but 1 or 2 non specific tiny foci of T2 and FLAIR signal in the subcortical white matter at the left frontoparietal vertex. Patient indicated that for the past 2 weeks she has been having daily, constant shrarp pain-numbness-tingling that starts in her neck and travels to the whole right arm and leg. She said that she had right face numbness when initially evaluated by neurology one week ago, " but that happened only once". She said that the pain seems to be the main problem but the weakness of the right side happens regardless of pain and just couple of days ago she started dragging the right leg. The pain is " increased by everything" and is not controlled anymore by tramadol and percocet. Also takes gabapentin 100 mg TID. On the other hand, she reports 3 " passing pit spells" in which she transiently can not do anything and is not fully aware of her surroundings. No abnormal movements, bladder incontinence or tongue biting. Denies HA, vertigo, double vision, painful visual loss, slurred speech, language or vision impairment, but complains of HA,  neck pain, and occasional episodes of urgency to urinate.   Past Medical History  Diagnosis Date  . Hypertension   . Complication of anesthesia     headache when come out of anesthesia  . Headache(784.0)     occ. migraines, not frequent  . Arthritis     arthritis-back, weakness in right leg  . Syncope     Past Surgical History  Procedure Laterality Date  . Left foot bone spur surgery  10 yrs ago  . Abdominal hysterectomy  14 yrs ago  . Lumbar laminectomy/decompression microdiscectomy  07/13/2012    Procedure: LUMBAR LAMINECTOMY/DECOMPRESSION MICRODISCECTOMY 1 LEVEL;  Surgeon: Tobi Bastos, MD;  Location: [facility];  Service: Orthopedics;  Laterality: N/A;  Central Decompression Lumbar Laminectomy L5 - S1 (X-Ray)  . Lumbar laminectomy/decompression microdiscectomy Right 03/07/2013    Procedure:  LUMBAR DECOMPRESSIVE HEMI LAMINECTOMY AND  MICRODISCECTOMY  L5-S1 RIGHT ;  Surgeon: Tobi Bastos, MD;  Location: [facility];  Service: Orthopedics;  Laterality: Right;  . Hemi-microdiscectomy lumbar laminectomy level 1 N/A 09/28/2013    Procedure: HEMI-MICRODISCECTOMY LUMBAR LAMINECTOMY L5-S1 LEFT;  Surgeon: Tobi Bastos, MD;  Location: [facility];  Service: Orthopedics;  Laterality: N/A;    Family History  Problem Relation Age of Onset  . Ataxia Neg Hx   . Chorea Neg Hx   . Dementia Neg Hx   . Mental retardation Neg Hx   . Migraines Neg Hx   . Multiple sclerosis Neg Hx   . Neurofibromatosis Neg Hx   . Neuropathy Neg Hx   . Parkinsonism Neg Hx   . Seizures Neg Hx   . Stroke Neg Hx     Social History:  reports that she has been smoking Cigarettes.  She has a 10.5 pack-year smoking history. She has never used smokeless tobacco. She reports that she drinks alcohol. She reports that she does not use illicit drugs.  No Known Allergies  MEDICATIONS:                                                                                                                     I have reviewed the  patient's current medications.   ROS:                                                                                                                                       History obtained from the patient and chart review.  General ROS: negative for - chills, fever, night sweats, weight gain or weight loss Psychological ROS: negative for - behavioral disorder, hallucinations, memory difficulties, or suicidal ideation Ophthalmic ROS: negative for - blurry vision, double vision, eye pain or loss of vision ENT ROS: negative for - epistaxis, nasal discharge, oral lesions, sore throat, tinnitus or vertigo Allergy and Immunology ROS: negative for - hives or itchy/watery eyes Hematological and Lymphatic ROS: negative for - bleeding problems, bruising or swollen lymph nodes Endocrine ROS: negative for - galactorrhea, hair pattern changes, polydipsia/polyuria or temperature intolerance Respiratory ROS: negative for - cough, hemoptysis, shortness of breath or wheezing Cardiovascular ROS: negative for - chest pain, dyspnea on exertion, edema or irregular heartbeat Gastrointestinal ROS: negative for - abdominal pain, diarrhea, hematemesis, nausea/vomiting or stool incontinence Genito-Urinary ROS: negative for - dysuria, hematuria or  incontinence Musculoskeletal ROS: negative for - joint swelling Neurological ROS: as noted in HPI Dermatological ROS: negative for rash and skin lesion changes  Physical exam: pleasant female in no apparent distress.Blood pressure 124/93, pulse 63, temperature 98 F (36.7 C), temperature source Oral, resp. rate 20, height 5\' 5"  (1.651 m), weight 65.772 kg (145 lb), SpO2 98.00%.  Head: normocephalic. Neck: supple, no bruits, no JVD. Cardiac: no murmurs. Lungs: clear. Abdomen: soft, no tender, no mass. Extremities: no edema. Neurologic Examination:                                                                                                      General: Mental  Status: Alert, oriented, thought content appropriate.  Speech fluent without evidence of aphasia.  Able to follow 3 step commands without difficulty. Cranial Nerves: II: Discs flat bilaterally; Visual fields grossly normal, pupils equal, round, reactive to light and accommodation III,IV, VI: ptosis not present, extra-ocular motions intact bilaterally V,VII: smile symmetric, facial light touch sensation normal bilaterally VIII: hearing normal bilaterally IX,X: gag reflex present XI: bilateral shoulder shrug XII: midline tongue extension without atrophy or fasciculations Motor: Significant for mild weakness right arm and leg but this is an obvious inconsistent exam in which she doesn't offer offer appropriate effort. Tone and bulk:normal tone throughout; no atrophy noted Sensory: Pinprick and light touch intact throughout, bilaterally Deep Tendon Reflexes:  Right: Upper Extremity   Left: Upper extremity   biceps (C-5 to C-6) 2/4   biceps (C-5 to C-6) 2/4 tricep (C7) 2/4    triceps (C7) 2/4 Brachioradialis (C6) 2/4  Brachioradialis (C6) 2/4  Lower Extremity Lower Extremity  quadriceps (L-2 to L-4) 2/4   quadriceps (L-2 to L-4) 2/4 Achilles (S1) 2/4   Achilles (S1) 2/4  Plantars: Right: downgoing   Left: downgoing Cerebellar: normal finger-to-nose,  normal heel-to-shin test Gait:  No tested    No results found for this basename: cbc, bmp, coags, chol, tri, ldl, hga1c    Results for orders placed during the hospital encounter of 06/09/14 (from the past 48 hour(s))  CBC WITH DIFFERENTIAL     Status: Abnormal   Collection Time    06/09/14  9:30 PM      Result Value Ref Range   WBC 11.6 (*) 4.0 - 10.5 K/uL   RBC 4.56  3.87 - 5.11 MIL/uL   Hemoglobin 14.2  12.0 - 15.0 g/dL   HCT 41.3  36.0 - 46.0 %   MCV 90.6  78.0 - 100.0 fL   MCH 31.1  26.0 - 34.0 pg   MCHC 34.4  30.0 - 36.0 g/dL   RDW 12.2  11.5 - 15.5 %   Platelets 237  150 - 400 K/uL   Neutrophils Relative % 62  43 - 77  %   Neutro Abs 7.2  1.7 - 7.7 K/uL   Lymphocytes Relative 30  12 - 46 %   Lymphs Abs 3.4  0.7 - 4.0 K/uL   Monocytes Relative 7  3 - 12 %   Monocytes Absolute 0.8  0.1 - 1.0 K/uL  Eosinophils Relative 1  0 - 5 %   Eosinophils Absolute 0.1  0.0 - 0.7 K/uL   Basophils Relative 0  0 - 1 %   Basophils Absolute 0.0  0.0 - 0.1 K/uL    No results found.  Assessment/Plan: 48 y/o come in with complains of left sided pain-numbness-weakness and spells of passing out. Neuro-exam shows functional findings. Recent MRI showed 1 or 2 tiny foci of T2 and FLAIR signal in the subcortical white matter at the left frontoparietal vertex which don't meet criteria for MS at this time and will need follow up MRI with and without contrast in 3 months to ensure stability of such finding. Her exam is very inconsistent and doesn't pinpoint to the spinal cord as a cause of her symptoms. Regarding her paroxysmal events of passing out, they don't have a semiology consistent with seizures and thus will need to evaluate as outpatient. In sum, I am in favor of discharging patient home on a higher dose gabapentin ( 300 mg bid) with outpatient neurology follow up in 2 weeks.  Dorian Pod, MD 06/09/2014, 10:03 PM [facility]

## 2014-06-09 NOTE — ED Notes (Signed)
NEUROLOGIST TALKING WITH PT AT BEDSIDE

## 2014-06-09 NOTE — ED Provider Notes (Signed)
CSN: [id number]     Arrival date & time 06/09/14  2101 History   First MD Initiated Contact with Patient 06/09/14 2105     Chief Complaint  Patient presents with  . Weakness  . Near Syncope  . Extremity Pain     (Consider location/radiation/quality/duration/timing/severity/associated sxs/prior Treatment) HPI Comments: Patient presents to the ER with multiple problems. Patient reports that she has been experiencing right-sided pain, numbness, weakness for 2 months. She reports that she was seen last week in the ER and was sent for MRI, was told that there were no abnormalities. She continues to have significant pain in her right neck, right shoulder, down the arm, down the entire right side of her back and into her leg. She is seeing orthopedics for this and she was told that she needs to be followed up with a neurologist. The patient has had several syncopal episodes. She 2 episodes 4 days ago. Patient reports sudden falling to the ground and inability to get up. Husband did witness one of the episodes and reports that she was staring off into space and shaking, it took 15 minutes for her to become coherent again.  Patient is a 48 y.o. female presenting with weakness, near-syncope, and extremity pain.  Weakness  Near Syncope  Extremity Pain    Past Medical History  Diagnosis Date  . Hypertension   . Complication of anesthesia     headache when come out of anesthesia  . Headache(784.0)     occ. migraines, not frequent  . Arthritis     arthritis-back, weakness in right leg  . Syncope    Past Surgical History  Procedure Laterality Date  . Left foot bone spur surgery  10 yrs ago  . Abdominal hysterectomy  14 yrs ago  . Lumbar laminectomy/decompression microdiscectomy  07/13/2012    Procedure: LUMBAR LAMINECTOMY/DECOMPRESSION MICRODISCECTOMY 1 LEVEL;  Surgeon: Tobi Bastos, MD;  Location: [facility];  Service: Orthopedics;  Laterality: N/A;  Central Decompression Lumbar Laminectomy  L5 - S1 (X-Ray)  . Lumbar laminectomy/decompression microdiscectomy Right 03/07/2013    Procedure: LUMBAR DECOMPRESSIVE HEMI LAMINECTOMY AND  MICRODISCECTOMY  L5-S1 RIGHT ;  Surgeon: Tobi Bastos, MD;  Location: [facility];  Service: Orthopedics;  Laterality: Right;  . Hemi-microdiscectomy lumbar laminectomy level 1 N/A 09/28/2013    Procedure: HEMI-MICRODISCECTOMY LUMBAR LAMINECTOMY L5-S1 LEFT;  Surgeon: Tobi Bastos, MD;  Location: [facility];  Service: Orthopedics;  Laterality: N/A;   Family History  Problem Relation Age of Onset  . Ataxia Neg Hx   . Chorea Neg Hx   . Dementia Neg Hx   . Mental retardation Neg Hx   . Migraines Neg Hx   . Multiple sclerosis Neg Hx   . Neurofibromatosis Neg Hx   . Neuropathy Neg Hx   . Parkinsonism Neg Hx   . Seizures Neg Hx   . Stroke Neg Hx    History  Substance Use Topics  . Smoking status: Current Every Day Smoker -- 0.50 packs/day for 21 years    Types: Cigarettes  . Smokeless tobacco: Never Used  . Alcohol Use: Yes     Comment: rare   OB History   Grav Para Term Preterm Abortions TAB SAB Ect Mult Living                 Review of Systems  Cardiovascular: Positive for near-syncope.  Neurological: Positive for syncope and weakness.  All other systems reviewed and are negative.     Allergies  Review of patient's allergies indicates no known allergies.  Home Medications   Prior to Admission medications   Medication Sig Start Date End Date Taking? Authorizing Provider  ALPRAZolam Duanne Moron) 0.5 MG tablet Take 0.5 mg by mouth 4 (four) times daily as needed for anxiety.     Historical Provider, MD  amLODipine-olmesartan (AZOR) 5-20 MG per tablet Take 1 tablet by mouth daily before breakfast.    Historical Provider, MD  aspirin 325 MG tablet Take 325 mg by mouth daily.    Historical Provider, MD  gabapentin (NEURONTIN) 300 MG capsule Take 300 mg by mouth 3 (three) times daily.     Historical Provider, MD  indomethacin (INDOCIN) 25 MG  capsule Take 50 mg by mouth daily.    Historical Provider, MD  methocarbamol (ROBAXIN) 500 MG tablet Take 1 tablet (500 mg total) by mouth every 6 (six) hours as needed for muscle spasms. 06/03/14   Hosie Poisson, MD  metoprolol succinate (TOPROL-XL) 50 MG 24 hr tablet Take 25 mg by mouth 2 (two) times daily. Take with or immediately following a meal.    Historical Provider, MD  oxyCODONE-acetaminophen (PERCOCET) 5-325 MG per tablet Take 1 tablet by mouth every 4 (four) hours as needed. 06/07/14   Johnna Acosta, MD  PARoxetine (PAXIL) 20 MG tablet Take 20 mg by mouth every morning.    Historical Provider, MD   BP 120/76  Pulse 59  Temp(Src) 98 F (36.7 C) (Oral)  Resp 16  Ht 5\' 5"  (1.651 m)  Wt 145 lb (65.772 kg)  BMI 24.13 kg/m2  SpO2 96% Physical Exam  Constitutional: She is oriented to person, place, and time. She appears well-developed and well-nourished. No distress.  HENT:  Head: Normocephalic and atraumatic.  Right Ear: Hearing normal.  Left Ear: Hearing normal.  Nose: Nose normal.  Mouth/Throat: Oropharynx is clear and moist and mucous membranes are normal.  Eyes: Conjunctivae and EOM are normal. Pupils are equal, round, and reactive to light.  Neck: Normal range of motion. Neck supple.  Cardiovascular: Regular rhythm, S1 normal and S2 normal.  Exam reveals no gallop and no friction rub.   No murmur heard. Pulmonary/Chest: Effort normal and breath sounds normal. No respiratory distress. She exhibits no tenderness.  Abdominal: Soft. Normal appearance and bowel sounds are normal. There is no hepatosplenomegaly. There is no tenderness. There is no rebound, no guarding, no tenderness at McBurney's point and negative Murphy's sign. No hernia.  Musculoskeletal: Normal range of motion.  Neurological: She is alert and oriented to person, place, and time. She has normal strength. No cranial nerve deficit or sensory deficit. Coordination normal. GCS eye subscore is 4. GCS verbal subscore is  5. GCS motor subscore is 6.  Patient is very poor effort with the right side of her body, claims that movement hurts. She does have decreased grip strength on the right as well as decreased elevation of the leg it has gravity. Both of these movements, however, are inhibited by pain.  Skin: Skin is warm, dry and intact. No rash noted. No cyanosis.  Psychiatric: She has a normal mood and affect. Her speech is normal and behavior is normal. Thought content normal.    ED Course  Procedures (including critical care time) Labs Review Labs Reviewed  CBC WITH DIFFERENTIAL - Abnormal; Notable for the following:    WBC 11.6 (*)    All other components within normal limits  COMPREHENSIVE METABOLIC PANEL - Abnormal; Notable for the following:    Glucose,  Bld 113 (*)    Total Protein 5.9 (*)    Albumin 3.2 (*)    Total Bilirubin <0.2 (*)    All other components within normal limits  TROPONIN I    Imaging Review No results found.   EKG Interpretation   Date/Time:  Sunday June 09 2014 21:14:40 EDT Ventricular Rate:  59 PR Interval:  199 QRS Duration: 79 QT Interval:  399 QTC Calculation: 395 R Axis:   53 Text Interpretation:  Sinus rhythm Normal ECG Confirmed by Monic Engelmann  MD,  Ambar Raphael [id number]) on 06/09/2014 9:23:26 PM      MDM   Final diagnoses:  None   right sided pain, possible radiculopathy  Syncope  Patient was sent to the ER with complaints of pain diffusely on the right side of her body. Pain starts at the right shoulder, goes down the arm, across to the back and down the leg. This has been ongoing for 2 months. She has seen orthopedics for this and was told that she had degenerative disc disease.  Patient reports that she's having trouble walking because of the pain. She says that the act of walking causes increased pain. She had some mild pain to the base of the neurologic examination, but no functional deficits were noted. I did have Doctor [place], neurology see the  patient. This is because of the syncopal episodes with some shaking episodes, consideration for seizure, although it is felt unlikely. Additionally reviewing the MRI from several days ago performed because of right facial numbness, there was a focal lesion that could be consistent with demyelinating disease. Doctor [place] did not cause any acute intervention is necessary. He did not recommend treatment for seizure. He did not feel there is any sign of acute emergent neurologic condition that required further treatment, could be treated as an outpatient. She did have syncopal episodes, but the most recent was 4 days ago. She does not require hospitalization for this.  I discussed this with the patient. She did become somewhat upset because I was not going to admit her to the hospital. Her symptoms have been progressive over a period of 2 months. Do not feel there is any emergent condition that would require hospitalization. I recommended steroid and pain management. Meds were ordered, the patient became very upset when she learned that I was intubating her and she left before treatment. She also left before I could provide her with referral to neurology.    Orpah Greek, MD 06/09/14 [phone]

## 2014-06-10 ENCOUNTER — Other Ambulatory Visit (HOSPITAL_COMMUNITY): Payer: Self-pay | Admitting: Orthopedic Surgery

## 2014-06-10 DIAGNOSIS — M5020 Other cervical disc displacement, unspecified cervical region: Secondary | ICD-10-CM

## 2014-06-11 ENCOUNTER — Encounter: Payer: Self-pay | Admitting: Neurology

## 2014-06-11 ENCOUNTER — Ambulatory Visit (INDEPENDENT_AMBULATORY_CARE_PROVIDER_SITE_OTHER): Payer: 59 | Admitting: Neurology

## 2014-06-11 VITALS — BP 108/72 | HR 72 | Ht 65.0 in | Wt 145.0 lb

## 2014-06-11 DIAGNOSIS — M5116 Intervertebral disc disorders with radiculopathy, lumbar region: Secondary | ICD-10-CM

## 2014-06-11 DIAGNOSIS — R55 Syncope and collapse: Secondary | ICD-10-CM

## 2014-06-11 DIAGNOSIS — M5126 Other intervertebral disc displacement, lumbar region: Secondary | ICD-10-CM

## 2014-06-11 DIAGNOSIS — R209 Unspecified disturbances of skin sensation: Secondary | ICD-10-CM

## 2014-06-11 DIAGNOSIS — M5090 Cervical disc disorder, unspecified, unspecified cervical region: Secondary | ICD-10-CM

## 2014-06-11 DIAGNOSIS — G894 Chronic pain syndrome: Secondary | ICD-10-CM

## 2014-06-11 DIAGNOSIS — R202 Paresthesia of skin: Secondary | ICD-10-CM

## 2014-06-11 NOTE — Progress Notes (Signed)
NEUROLOGY FOLLOW UP OFFICE NOTE  Abigail [name] [id number]  HISTORY OF PRESENT ILLNESS: Abigail [name] is a 48 year old left-handed woman with hypertension, anxiety, spinal stenosis and chronic pain who presents for evaluation of "petite mal seizures."  I previously saw her for cervical and lumbar pain.  Records and images reviewed.  She continues to have right sided numbness and tingling involving the arm, right side of torso, and leg.  Earlier this month, it began to involve the face.  Following these spells, she notes throbbing bi-frontal headache associated with photophobia but no nausea or vomiting.  She was admitted to [facility] on 06/02/14 for evaluation of stroke.  The notes mention that the numbness was transient and recurrent, and traveled down the arm and leg, but patient tells me that it is constant and always present.  As per the hospital notes, she had 4/5 strength in the right arm and leg.  MRI of the brain without contrast revealed 1 or 2 tiny hyperintense foci in the subcortical white matter at the left frontoparietal vertex.  A complicated migraine was suspected.  Her strength and headache improved and she was discharged.  She returned to the ED at [facility] on 06/06/14 for two episodes of syncope.  The first one was not witnessed.  She was outside with her dogs, when suddenly she felt sick.  She went back inside.  She was standing in the doorway calling her dogs inside when the next thing she knows, she is on the floor.  She is uncertain how long she was unconscious.  She felt extremely hot and sweaty and had to crawl into the kitchen to sit over the air conditioning vent.  Later, her husband came home.  She was walking with her cane when suddenly, without warning, she fell hitting the coffee table and onto the floor.  Her husband noted that her eyes were open, staring ahead.  There were no convulsions or other abnormal movements.  There was no posturing, tongue biting, urinary or  bowel incontinence.  She was unresponsive for about 30 seconds.  She was postictal (confused and slow to respond) for about 10-15 minutes.  Later, she was sitting on the chair and her husband noted that she was having a staring spell for about 10 minutes or so.  XR of the cervical spine revealed degenerative changes.  She was given Percocet for the pain and was discharged.  She has since returned to the ED for continued neck pain.  She reports one episode of syncope three years ago in the setting of dehydration.  She denies history of seizures or syncope.  She denies family history of seizures.  PAST MEDICAL HISTORY: Past Medical History  Diagnosis Date  . Hypertension   . Complication of anesthesia     headache when come out of anesthesia  . Headache(784.0)     occ. migraines, not frequent  . Arthritis     arthritis-back, weakness in right leg  . Syncope     MEDICATIONS: Current Outpatient Prescriptions on File Prior to Visit  Medication Sig Dispense Refill  . ALPRAZolam (XANAX) 0.5 MG tablet Take 0.5 mg by mouth 4 (four) times daily as needed for anxiety.       Marland Kitchen amLODipine-olmesartan (AZOR) 5-20 MG per tablet Take 1 tablet by mouth daily before breakfast.      . aspirin 325 MG tablet Take 325 mg by mouth daily.      Marland Kitchen gabapentin (NEURONTIN) 300 MG  capsule Take 300 mg by mouth 3 (three) times daily.       . indomethacin (INDOCIN) 25 MG capsule Take 50 mg by mouth daily.      . methocarbamol (ROBAXIN) 500 MG tablet Take 1 tablet (500 mg total) by mouth every 6 (six) hours as needed for muscle spasms.  20 tablet  0  . metoprolol succinate (TOPROL-XL) 50 MG 24 hr tablet Take 25 mg by mouth 2 (two) times daily. Take with or immediately following a meal.      . PARoxetine (PAXIL) 20 MG tablet Take 20 mg by mouth every morning.      Marland Kitchen oxyCODONE-acetaminophen (PERCOCET) 5-325 MG per tablet Take 1 tablet by mouth every 4 (four) hours as needed.  10 tablet  0   No current facility-administered  medications on file prior to visit.    ALLERGIES: No Known Allergies  FAMILY HISTORY: Family History  Problem Relation Age of Onset  . Ataxia Neg Hx   . Chorea Neg Hx   . Dementia Neg Hx   . Mental retardation Neg Hx   . Migraines Neg Hx   . Multiple sclerosis Neg Hx   . Neurofibromatosis Neg Hx   . Neuropathy Neg Hx   . Parkinsonism Neg Hx   . Seizures Neg Hx   . Stroke Neg Hx     SOCIAL HISTORY: History   Social History  . Marital Status: Married    Spouse Name: N/A    Number of Children: N/A  . Years of Education: N/A   Occupational History  . Not on file.   Social History Main Topics  . Smoking status: Current Every Day Smoker -- 0.50 packs/day for 21 years    Types: Cigarettes  . Smokeless tobacco: Never Used  . Alcohol Use: Yes     Comment: rare  . Drug Use: No  . Sexual Activity: Yes   Other Topics Concern  . Not on file   Social History Narrative  . No narrative on file    REVIEW OF SYSTEMS: Constitutional: No fevers, chills, or sweats, no generalized fatigue, change in appetite Eyes: No visual changes, double vision, eye pain Ear, nose and throat: No hearing loss, ear pain, nasal congestion, sore throat Cardiovascular: No chest pain, palpitations Respiratory:  No shortness of breath at rest or with exertion, wheezes GastrointestinaI: No nausea, vomiting, diarrhea, abdominal pain, fecal incontinence Genitourinary:  No dysuria, urinary retention or frequency Musculoskeletal:  Neck pain, back pain Integumentary: No rash, pruritus, skin lesions Neurological: as above Psychiatric: Depression, insomnia, anxiety Endocrine: No palpitations, fatigue, diaphoresis, mood swings, change in appetite, change in weight, increased thirst Hematologic/Lymphatic:  No anemia, purpura, petechiae. Allergic/Immunologic: no itchy/runny eyes, nasal congestion, recent allergic reactions, rashes  PHYSICAL EXAM: Filed Vitals:   06/11/14 1251  BP: 108/72  Pulse: 72    General: No acute distress Head:  Normocephalic/atraumatic Neck: supple, bilateral paraspinal tenderness, reduced range of motion Heart:  Regular rate and rhythm Lungs:  Clear to auscultation bilaterally Back: Bilateral paraspinal tenderness Neurological Exam: alert and oriented to person, place, and time. Attention span and concentration intact, recent and remote memory intact, fund of knowledge intact.  Speech fluent and not dysarthric, language intact.  Endorses reduced pinprick and vibration sensation of right V1-V3, splitting the midline.  Otherwise, CN II-XII intact. Fundoscopic exam unremarkable without vessel changes, exudates, hemorrhages or papilledema.  Bulk and tone normal, muscle strength 5/5 throughout (with some mild reduced effort on the right due to pain).  Endorses reduced pinprick and vibration on the right leg, arm, and reduced pinprick on right side of torso splitting midline.  Deep tendon reflexes 2+ throughout, toes downgoing.  Finger to nose intact.  Antalgic gait with right limp. Romberg negative.  IMPRESSION: Transient loss of consciousness.  Etiology unclear and not clinically definite for seizures.  At this point, I would not initiate antiepileptic therapy since I am uncertain of the diagnosis. Right sided numbness, non-physiologic.  Numbness splits the midline, including vibratory sensation on the forehead. Chronic pain with degenerative disc disease  PLAN: 1.  We will start with sleep-deprived EEG.   2.  If EEG unremarkable, I recommend cardiac evaluation 3.  She was informed that [place] law states a person cannot drive for 6 months from the time of unexplained loss of consciousness or awareness.  Metta Clines, DO  CC:  Juanita Craver, MD  Latanya Maudlin, MD

## 2014-06-11 NOTE — Patient Instructions (Signed)
At this point, I am not sure of the cause of loss of consciousness.  We will get EEG of your brain to look for abnormalities that may suggest risk for seizures.  You are not to drive for 6 months from last episode of loss of consciousness, as per [place] law.  Follow up soon after.

## 2014-06-13 ENCOUNTER — Ambulatory Visit (HOSPITAL_COMMUNITY)
Admission: RE | Admit: 2014-06-13 | Discharge: 2014-06-13 | Disposition: A | Discharge status: Home / Self Care | Payer: 59 | Source: Ambulatory Visit | Attending: Internal Medicine | Admitting: Internal Medicine

## 2014-06-13 DIAGNOSIS — M5020 Other cervical disc displacement, unspecified cervical region: Secondary | ICD-10-CM | POA: Insufficient documentation

## 2014-06-13 NOTE — Progress Notes (Signed)
Carotid Duplex Completed. °Brianna L Mazza,RVT °

## 2014-06-14 ENCOUNTER — Telehealth (HOSPITAL_COMMUNITY): Payer: Self-pay | Admitting: *Deleted

## 2014-06-18 ENCOUNTER — Ambulatory Visit (INDEPENDENT_AMBULATORY_CARE_PROVIDER_SITE_OTHER): Payer: 59 | Admitting: Neurology

## 2014-06-18 DIAGNOSIS — R55 Syncope and collapse: Secondary | ICD-10-CM

## 2014-06-20 ENCOUNTER — Telehealth: Payer: Self-pay | Admitting: Neurology

## 2014-06-20 NOTE — Telephone Encounter (Signed)
Pt called requesting to speak to a nurse. She is still having dizzy spells and blurry vision. Please call pt # [phone]

## 2014-06-20 NOTE — Telephone Encounter (Signed)
Patient states she is still having dizzy spells with blurry vision I advised her to go to ED if this continued she said they did nothing for her at all at ED she had a episode this past weekend went to ED and they just sent her home. Please advise

## 2014-06-21 NOTE — Telephone Encounter (Signed)
Patient was advised per Dr Tomi Likens if she is still feeling the dizziness and  Visual issues she should consider going to ED or her PCP . She agreed with this

## 2014-06-21 NOTE — Telephone Encounter (Signed)
Pt called again f/u on the message from yesterday. Please call pt back # [phone]

## 2014-06-21 NOTE — Telephone Encounter (Signed)
At this time, I do not know what is going on.  From my standpoint, we are getting an EEG to look for any evidence of seizure activity, however,she may need other workup as well, such as cardiac.  At this point, if there is a problem, I would have her go to the ED or contact her PCP.

## 2014-06-25 ENCOUNTER — Ambulatory Visit (INDEPENDENT_AMBULATORY_CARE_PROVIDER_SITE_OTHER): Payer: 59 | Admitting: Neurology

## 2014-06-25 ENCOUNTER — Encounter: Payer: Self-pay | Admitting: Neurology

## 2014-06-25 ENCOUNTER — Ambulatory Visit: Payer: 59 | Admitting: Neurology

## 2014-06-25 VITALS — BP 130/76 | HR 62 | Temp 98.1°F | Resp 16 | Wt 147.0 lb

## 2014-06-25 DIAGNOSIS — R200 Anesthesia of skin: Secondary | ICD-10-CM

## 2014-06-25 DIAGNOSIS — R209 Unspecified disturbances of skin sensation: Secondary | ICD-10-CM

## 2014-06-25 DIAGNOSIS — R202 Paresthesia of skin: Secondary | ICD-10-CM

## 2014-06-25 DIAGNOSIS — R42 Dizziness and giddiness: Secondary | ICD-10-CM

## 2014-06-25 DIAGNOSIS — R55 Syncope and collapse: Secondary | ICD-10-CM

## 2014-06-25 NOTE — Progress Notes (Signed)
NEUROLOGY FOLLOW UP OFFICE NOTE  [name] [id number]  HISTORY OF PRESENT ILLNESS: Abigail [name] is a 48 year old left-handed woman with hypertension, anxiety, spinal stenosis and chronic pain who presents for evaluation of "petite mal seizures."  I previously saw her for cervical and lumbar pain.  Records and images reviewed.  UPDATE: Last week, she had a couple of episodes of dizziness, which she described as a sensation of lightheadedness.  She did not pass out.  She had a sleep-deprived EEG performed on 06/18/14, which was normal.  She also had a carotid duplex performed on 06/13/14, which was normal.  HISTORY: She continues to have right sided numbness and tingling involving the arm, right side of torso, and leg.  Earlier this month, it began to involve the face.  Following these spells, she notes throbbing bi-frontal headache associated with photophobia but no nausea or vomiting.  She was admitted to [facility] on 06/02/14 for evaluation of stroke.  The notes mention that the numbness was transient and recurrent, and traveled down the arm and leg, but patient tells me that it is constant and always present.  As per the hospital notes, she had 4/5 strength in the right arm and leg.  MRI of the brain without contrast revealed 1 or 2 tiny hyperintense foci in the subcortical white matter at the left frontoparietal vertex.  A complicated migraine was suspected.  Her strength and headache improved and she was discharged.  She returned to the ED at [facility] on 06/06/14 for two episodes of syncope.  The first one was not witnessed.  She was outside with her dogs, when suddenly she felt sick.  She went back inside.  She was standing in the doorway calling her dogs inside when the next thing she knows, she is on the floor.  She is uncertain how long she was unconscious.  She felt extremely hot and sweaty and had to crawl into the kitchen to sit over the air conditioning vent.  Later, her husband  came home.  She was walking with her cane when suddenly, without warning, she fell hitting the coffee table and onto the floor.  Her husband noted that her eyes were open, staring ahead.  There were no convulsions or other abnormal movements.  There was no posturing, tongue biting, urinary or bowel incontinence.  She was unresponsive for about 30 seconds.  She was confused and slow to respond for about 10-15 minutes.  Later, she was sitting on the chair and her husband noted that she was having a staring spell for about 10 minutes or so.  XR of the cervical spine revealed degenerative changes.  She was given Percocet for the pain and was discharged.  She has since returned to the ED for continued neck pain.  She reports one episode of syncope three years ago in the setting of dehydration.  She denies history of seizures or syncope.  She denies family history of seizures.  When she has passed out, it seemed to be in the setting of extreme leg pain.  PAST MEDICAL HISTORY: Past Medical History  Diagnosis Date  . Hypertension   . Complication of anesthesia     headache when come out of anesthesia  . Headache(784.0)     occ. migraines, not frequent  . Arthritis     arthritis-back, weakness in right leg  . Syncope     MEDICATIONS: Current Outpatient Prescriptions on File Prior to Visit  Medication Sig Dispense Refill  .  ALPRAZolam (XANAX) 0.5 MG tablet Take 0.5 mg by mouth 4 (four) times daily as needed for anxiety.       Marland Kitchen amLODipine-olmesartan (AZOR) 5-20 MG per tablet Take 1 tablet by mouth daily before breakfast.      . aspirin 325 MG tablet Take 325 mg by mouth daily.      Marland Kitchen gabapentin (NEURONTIN) 300 MG capsule Take 300 mg by mouth 3 (three) times daily.       . indomethacin (INDOCIN) 25 MG capsule Take 50 mg by mouth daily.      . methocarbamol (ROBAXIN) 500 MG tablet Take 1 tablet (500 mg total) by mouth every 6 (six) hours as needed for muscle spasms.  20 tablet  0  . metoprolol succinate  (TOPROL-XL) 50 MG 24 hr tablet Take 25 mg by mouth 2 (two) times daily. Take with or immediately following a meal.      . oxyCODONE-acetaminophen (PERCOCET) 5-325 MG per tablet Take 1 tablet by mouth every 4 (four) hours as needed.  10 tablet  0  . PARoxetine (PAXIL) 20 MG tablet Take 20 mg by mouth every morning.      . traMADol-acetaminophen (ULTRACET) 37.5-325 MG per tablet        No current facility-administered medications on file prior to visit.    ALLERGIES: No Known Allergies  FAMILY HISTORY: Family History  Problem Relation Age of Onset  . Ataxia Neg Hx   . Chorea Neg Hx   . Dementia Neg Hx   . Mental retardation Neg Hx   . Migraines Neg Hx   . Multiple sclerosis Neg Hx   . Neurofibromatosis Neg Hx   . Neuropathy Neg Hx   . Parkinsonism Neg Hx   . Seizures Neg Hx   . Stroke Neg Hx     SOCIAL HISTORY: History   Social History  . Marital Status: Married    Spouse Name: N/A    Number of Children: N/A  . Years of Education: N/A   Occupational History  . Not on file.   Social History Main Topics  . Smoking status: Current Every Day Smoker -- 0.50 packs/day for 21 years    Types: Cigarettes  . Smokeless tobacco: Never Used     Comment: is aware she needs to quit   . Alcohol Use: Yes     Comment: rare  . Drug Use: No  . Sexual Activity: Yes    Partners: Male   Other Topics Concern  . Not on file   Social History Narrative  . No narrative on file    REVIEW OF SYSTEMS: Constitutional: No fevers, chills, or sweats, no generalized fatigue, change in appetite Eyes: No visual changes, double vision, eye pain Ear, nose and throat: No hearing loss, ear pain, nasal congestion, sore throat Cardiovascular: No chest pain, palpitations Respiratory:  No shortness of breath at rest or with exertion, wheezes GastrointestinaI: No nausea, vomiting, diarrhea, abdominal pain, fecal incontinence Genitourinary:  No dysuria, urinary retention or frequency Musculoskeletal:   No neck pain, back pain Integumentary: No rash, pruritus, skin lesions Neurological: as above Psychiatric: No depression, insomnia, anxiety Endocrine: No palpitations, fatigue, diaphoresis, mood swings, change in appetite, change in weight, increased thirst Hematologic/Lymphatic:  No anemia, purpura, petechiae. Allergic/Immunologic: no itchy/runny eyes, nasal congestion, recent allergic reactions, rashes  PHYSICAL EXAM: Filed Vitals:   06/25/14 0745  BP: 130/76  Pulse: 62  Temp: 98.1 F (36.7 C)  Resp: 16   General: No acute distress Head:  Normocephalic/atraumatic  IMPRESSION: Syncope Dizziness Right sided numbness and pain  PLAN: The syncopal episodes may be a vagal response to the uncontrolled pain.  I really don't suspect seizures.  I think she really needs to focus on appropriate treatment for her pain.  However, I think it would be appropriate to undergo a cardiac workup as well, just to make sure there is nothing cardiac-related that could contribute to syncope and lightheadedness.  As far as the right sided numbness is concerned, it really isn't episodic so I cannot really attribute that to migraine or seizure either.  She may follow up as needed and call with any questions or concerns.    15 minutes spent with patient and her husband, 100% spent discussing EEG results, possible diagnoses and plan.  Abigail Clines, DO  CC:  Juanita Craver, MD

## 2014-06-25 NOTE — Patient Instructions (Signed)
EEG was normal.  At this point, I really don't suspect seizures causing the passing out and right sided numbness. It could be related to pain, so the pain really needs to be controlled.  I would recommend cardiac evaluation, just to make sure that is okay.  Please call with questions or concerns.

## 2014-06-25 NOTE — Procedures (Signed)
ELECTROENCEPHALOGRAM REPORT  Date of Study: 06/18/2014  Patient's Name: Abigail [name] MRN: [id number] Date of Birth: [birth year]  Referring Provider: Metta Clines, DO  Indication: Episodic syncope, right sided numbness  Medications: Gabapentin Xanax Robaxin  Percocet Paroxetine Ultram  Technical Summary: This is a multichannel digital EEG recording, using the international 10-20 placement system.  Spike detection software was employed.  Description: The EEG background is symmetric, with a well-developed posterior dominant rhythm of 9-10 Hz, which is reactive to eye opening and closing.  Diffuse beta activity is seen, with a bilateral frontal preponderance.  No focal or generalized abnormalities are seen.  No focal or generalized epileptiform discharges are seen.  Stage II sleep is not seen.  Hyperventilation and photic stimulation were performed, and produced no abnormalities.  ECG revealed normal cardiac rate and rhythm.  Impression: This is a normal sleep-deprived EEG of the awake and drowsy states, with activating procedures.  A normal study does not rule out the possibility of a seizure disorder in this patient.  Adam R. Tomi Likens, DO

## 2014-11-21 ENCOUNTER — Other Ambulatory Visit: Payer: Self-pay

## 2014-11-21 DIAGNOSIS — Z1231 Encounter for screening mammogram for malignant neoplasm of breast: Secondary | ICD-10-CM

## 2014-11-28 ENCOUNTER — Ambulatory Visit: Payer: Self-pay

## 2015-01-16 ENCOUNTER — Other Ambulatory Visit: Payer: Self-pay | Admitting: Neurology

## 2015-01-16 DIAGNOSIS — R200 Anesthesia of skin: Secondary | ICD-10-CM

## 2015-01-24 ENCOUNTER — Ambulatory Visit
Admission: RE | Admit: 2015-01-24 | Discharge: 2015-01-24 | Disposition: A | Discharge status: Home / Self Care | Payer: 59 | Source: Ambulatory Visit | Attending: Neurology | Admitting: Neurology

## 2015-01-24 DIAGNOSIS — R200 Anesthesia of skin: Secondary | ICD-10-CM

## 2015-01-31 NOTE — Discharge Summary (Signed)
PATIENT NAME:  Abigail [name], Abigail [name] MR#:  [id number] DATE OF BIRTH:  10-13-1965  DATE OF ADMISSION:  09/26/2013 DATE OF DISCHARGE:  09/27/2013  ADMISSION DIAGNOSIS: Back pain.   DISCHARGE DIAGNOSIS: Back pain with a L5-S1 subarticular disk extrusion with likely mass effect in the transversing left S1 nerve.   CONSULTANTS: Neurology.  HOSPITAL COURSE: A 49 year old female who presented status post laminectomy on the right side who presented with back pain. For further details, please refer to the H and P.  1.  Back pain. The patient is status post a right laminectomy at [facility] with Dr. Sampson Goon. She came in with some weakness. A MRI showed bulging disk with possible mass effect. I spoke with Dr. Sampson Goon at [facility]. He is able to admit the patient for direct admit at [facility] with possible surgery since this is her orthopedic surgeon who has done her previous surgeries and we at this time do not have an orthopedic surgeon that may help the patient. She is started on some pain medications and will be transferred today to [facility]. 2.  Hypertension, good control.  3.  Tobacco abuse. The patient was on nicotine replacement therapy here. She was counseled for 3-1/2 minutes at discharge to stop smoking.  4.  Anxiety and depression. The patient will continue on her home medications.   DISCHARGE MEDICATIONS: 1.  Xanax 0.5 mg 4 times a day as needed.  2.  Azor 5/20 mg 1 tablet daily.  3.  Paxil 20 mg daily.  4.  Metoprolol 50 mg half tablet b.i.d.  5.  Neurontin 300 mg 4 times a day.   DISCHARGE LABORATORY DATA: Which were performed on the 16th: Sodium 137, potassium 3.8, chloride 106, bicarb 27, BUN 9, creatinine 0.64, and glucose is 91. White blood cells 14, hemoglobin 15, hematocrit 43.7, and platelets 259.  The patient is medically stable for discharge.   TIME SPENT: Approximately 45 minutes.  ____________________________ Donell Beers. Benjie Karvonen, MD spm:sb D: 09/27/2013 10:33:38  ET T: 09/27/2013 10:51:25 ET JOB#: [id number]  cc: Destiny Trickey P. Benjie Karvonen, MD, <Dictator> Dr. Sampson Goon ([facility], [facility])  Donell Beers Cammeron Greis MD ELECTRONICALLY SIGNED 09/27/2013 15:52

## 2015-01-31 NOTE — Consult Note (Signed)
Referring Physician:  Monica Becton :   Primary Care Physician:  Everlean Patterson, [address] [place], [place] [address], [phone]  Reason for Consult: Admit Date: 25-Sep-2013  Chief Complaint: extremity pain and numbness  Reason for Consult: extremity pain and numbness   History of Present Illness: History of Present Illness:   49 year old woman with chronic back pain s/p L5-S1 fusion presents with worsening lower back pain.  Says her lower back pain and gradually worsened over the past year or so.  Lower back pain did not improve after fusion surgery, as above.  In particular, patient is having lower back pain radiating into the left leg.  It is severe.  It is constant now.  Feels it is debilitating.  Complains of some concurrent weakness in the LLE as well.  Is not getting any relief from muscle relaxant, gabapentin, narcotics or steroids.   MEDICAL HISTORY:  Osteoarthritis. Anemia. Tobacco use. Panic attacks. Hypertension. Anxiety.  SURGICAL HISTORY: Cholecystectomy. Hysterectomy. Back surgery.  HISTORY: to smoke 1/2 pack a day. drinking alcohol or using illicit drugs. with her husband.  HISTORY:  MEDICATIONS: Toprol-XL 25 mg 2 times a day. Robaxin 500 mg 2 tablets 4 times a day. Paxil 20 mg once a day. Neurontin 300 mg 3 times a day. Celebrex 200 mg 2 times a day. Azor 5/20 one tablet once a day. Aspirin 81 mg once a day. Alprazolam 1 tablet 4 times a day as needed. Norco 5/325 mg every 6 hours as needed.   ALLERGIES:  NKDA.  ROS:  General denies complaints   HEENT no complaints   Lungs no complaints   Cardiac no complaints   GI no complaints   GU no complaints   Musculoskeletal no complaints   Extremities no complaints   Skin no complaints   Endocrine no complaints   Psych no complaints   Past Medical/Surgical Hx:  Arthritis:   Anemia:   Tobacco Use:   Panic Attacks:   Migraines:   hypertension:   amxiety:    Cholecystectomy:   Hysterectomy:   back surgery:   Home Medications: Medication Instructions Last Modified Date/Time  Azor 5 mg-20 mg oral tablet 1 tab(s) orally once a day (in the morning) 17-Dec-14 00:51  Paxil 20 mg oral tablet 1 tab(s) orally once a day 17-Dec-14 00:51  Toprol-XL 50 mg oral tablet, extended release 0.5 tab(s) orally 2 times a day 17-Dec-14 00:51  ALPRAZolam 0.5 mg oral tablet 1 tab(s) orally 4 times a day, As Needed 17-Dec-14 00:51  Neurontin 300 mg oral capsule 1 cap(s) orally 4 times a day 17-Dec-14 00:51   Allergies:  No Known Allergies:   Vital Signs: **Vital Signs.:   17-Dec-14 15:38  Vital Signs Type Routine  Temperature Temperature (F) 97.9  Celsius 36.6  Temperature Source oral  Pulse Pulse 57  Pulse source if not from Vital Sign Device per cardiac monitor  Respirations Respirations 18  Systolic BP Systolic BP 725  Diastolic BP (mmHg) Diastolic BP (mmHg) 89  Mean BP 99  Pulse Ox % Pulse Ox % 98  Pulse Ox Activity Level  At rest  Oxygen Delivery Room Air/ 21 %   EXAM: GENERAL: Pleasant woman, in pain.  Normocephalic and atraumatic.  EYES: Funduscopic exam shows normal disc size, appearance and C/D ratio without clear evidence of papilledema.  CARDIOVASCULAR: S1 and S2 sounds are within normal limits, without murmurs, gallops, or rubs.  MUSCULOSKELETAL: Bulk - Normal Tone - Normal Pronator  Drift - Absent bilaterally. Ambulation - Gait and station is limited due to severe lower extremity and back pain.  R/L 5/5    Shoulder abduction (deltoid/supraspinatus, axillary/suprascapular n, C5) 5/5    Elbow flexion (biceps brachii, musculoskeletal n, C5-6) 5/5    Elbow extension (triceps, radial n, C7) 5/5    Finger adduction (interossei, ulnar n, T1)   5/4    Hip flexion (iliopsoas, L1/L2) 5/4    Knee flexion (hamstrings, sciatic n, L5/S1) 5/4    Knee extension (quadriceps, femoral n, L3/4) 5/5    Ankle dorsiflexion (tibialis anterior,  deep fibular n, L4/5) 5/5    Ankle plantarflexion (gastroc, tibial n, S1)  NEUROLOGICAL: MENTAL STATUS: Patient is oriented to person, place and time.  Recent and remote memory are intact.  Attention span and concentration are intact.  Naming, repetition, comprehension and expressive speech are within normal limits.  Patient's fund of knowledge is within normal limits for educational level.  CRANIAL NERVES: Normal    CN II (normal visual acuity and visual fields) Normal    CN III, IV, VI (extraocular muscles are intact) Normal    CN V (facial sensation is intact bilaterally) Normal    CN VII (facial strength is intact bilaterally) Normal    CN VIII (hearing is intact bilaterally) Normal    CN IX/X (palate elevates midline, normal phonation) Normal    CN XI (shoulder shrug strength is normal and symmetric) Normal    CN XII (tongue protrudes midline)   SENSATION: Intact to pain and temp bilaterally (spinothalamic tracts) Intact to position and vibration bilaterally (dorsal columns)   REFLEXES: R/L 2+/2+    Biceps 2+/2+    Brachioradialis   2+/2+    Patellar 2+/2+    Achilles   COORDINATION/CEREBELLAR: Finger to nose testing is within normal limits..  Lab Results:  Routine Chem:  16-Dec-14 22:51   Glucose, Serum 91  BUN 9  Creatinine (comp) 0.64  Sodium, Serum 137  Potassium, Serum 3.8  Chloride, Serum 106  CO2, Serum 27  Calcium (Total), Serum 9.0  Anion Gap  4  Osmolality (calc) 272  eGFR (African American) >60  eGFR (Non-African American) >60 (eGFR values <76m/min/1.73 m2 may be an indication of chronic kidney disease (CKD). Calculated eGFR is useful in patients with stable renal function. The eGFR calculation will not be reliable in acutely ill patients when serum creatinine is changing rapidly. It is not useful in  patients on dialysis. The eGFR calculation may not be applicable to patients at the low and high extremes of body sizes, pregnant women, and  vegetarians.)  Routine Hem:  16-Dec-14 22:51   WBC (CBC)  14.7  RBC (CBC) 4.82  Hemoglobin (CBC) 15.1  Hematocrit (CBC) 43.7  Platelet Count (CBC) 259 (Result(s) reported on 25 Sep 2013 at 11:14PM.)  MCV 91  MCH 31.3  MCHC 34.6  RDW 12.8   Impression/Recommendations: Recommendations:   MR LUMBAR SPINE WO/W  - Sep 24 2013  3:27PM  DATA:  Low back pain and tingling/ numbness in both legs.are worse in the left leg and have been present for 3Two prior lumbar spine surgeries, most recently in May 2014. LUMBAR SPINE WITHOUT AND WITH CONTRAST and multiecho pulse sequences of the lumbar spine werewithout and with intravenous contrast. 14 cc MultiHance  CT abdomen and pelvis 03/21/2013. Lumbar spine05/28/2014 and earlier No prior lumbar spine MRI is grade 1 retrolisthesis of L5 on S1. Vertebral body heightspreserved. There is moderate to severe disc space narrowing atwith  adjacent degenerative marrow changes. Mild disc spaceand disc desiccation are present at L4-5. The conusis normal in signal and terminates at L1-2. Small T2lesions are seen in both kidneys, left larger thanmost likely cysts. Negative.  Negative.  Negative.  Negative. Mild disc bulge and facet and ligamentum flavum hypertrophyspinal canal or neural foraminal stenosis. Prior right hemilaminectomy. Enhancing granulation tissue isat the laminectomy site and within the epidural space to theof the spinal canal. There is a circumferential disc bulgesuperimposed moderate-sized left subarticular disc extrusionmild caudal migration. There is mild bilateral lateral recesswithout spinal canal stenosis. Facet arthrosis anddisc osteophyte complexes result in mild-to-moderate,greater than left neural foraminal stenosis.  Left L5-S1 subarticular disc extrusion with likely mass effect ontraversing left S1 nerve.Sequelae of right L5-S1 hemilaminectomy. Mild to moderateneural foraminal narrowing at L58-S22.  49 year old woman with chronic back pain  s/p L5-S1 fusion presents with worsening lower back pain.   with L spine MRI showing L5-S1 disc extrusion with mass effect on the left S1 nerve.  Given the severe lower back pain unresponsive to typical medical therapy, would recommend referral for surgical consideration.  Patient agrees with this, she is in favor of surgery at this time.  I explained that surgery does not always result in significant improvement of lower back pain symptoms, especially in patients who have already had surgery.  However, she feels strongly that surgery is the best way forward for her at this time.  Discussed possibility of trying solumedrol steroid infusions for antiinflammatory and pain control treatment, she has declined this.  Agree with continuing toradol along with neurontin and robaxin.  Rec discussing her case with her neurosurgeon to determine if he would accept a transfer for surgical consideration.   have reviewed the results of the most recent imaging studies, tests and labs as outlined above and answered all related questions.  have personally viewed the patient's Brain MRI and it shows disc extrusion with mass effect on left S1 nerve, as above.   and coordinated plan of care and recommendations with hospitalist, patient and husband today.  Electronic Signatures: Anabel Bene (MD)  (Signed 19-Dec-14 02:11)  Authored: REFERRING PHYSICIAN, Primary Care Physician, Consult, History of Present Illness, Review of Systems, PAST MEDICAL/SURGICAL HISTORY, HOME MEDICATIONS, ALLERGIES, NURSING VITAL SIGNS, Physical Exam-, LAB RESULTS, Recommendations   Last Updated: 19-Dec-14 02:11 by Anabel Bene (MD)

## 2015-01-31 NOTE — Op Note (Signed)
PATIENT NAME:  Abigail, [name] MR#:  [id number] DATE OF BIRTH:  1966/06/12  DATE OF PROCEDURE:  06/20/2013  PREOPERATIVE DIAGNOSIS: Symptomatic cholelithiasis.   POSTOPERATIVE DIAGNOSIS: Symptomatic cholelithiasis.   PROCEDURE: Laparoscopic cholecystectomy.   SURGEON: Phoebe Perch, M.D.   ANESTHESIA: General with endotracheal tube.   INDICATIONS: This is a patient with recurrent epigastric and right upper quadrant pain associated with fatty food intolerance and work-up showing gallstones. Preoperatively we discussed rationale for surgery, the options of observation, risk of bleeding, infection, recurrence of symptoms, failure to resolve her symptoms, open procedure, bile duct damage, bile duct leak, retained common bile duct stone, any of which could require further surgery and/or ERCP, stent, and papillotomy. This was all reviewed for her in the preop holding area. She understood and agreed to proceed.   FINDINGS: Signs of previous acute cholecystitis with extensive adhesions, some of which were quite vascularized. Multiple large gallstones requiring enlargement of the epigastric port.   DESCRIPTION OF PROCEDURE: The patient was induced to general anesthesia, given IV antibiotics. VTE prophylaxis was in place. She was prepped and draped in a sterile fashion. Marcaine was infiltrated in skin and subcutaneous tissues around the infraumbilical area.   Incision was made. Veress needle was placed. Pneumoperitoneum was obtained and a 5 mm trocar port was placed. The abdominal cavity was explored, and under direct vision, a 10 mm epigastric port and 2 lateral 5 mm ports were placed. Adhesions in the right mid quadrant were taken down with sharp dissection without the use of electrocautery. No bowel was involved in these adhesions.   Then the gallbladder was placed on tension. The peritoneum over the infundibulum was incised bluntly. Multiple adhesions were taken down bluntly without the use of  energy. The cystic duct-gallbladder junction was well identified. Multiple large stones were present in and around the infundibulum of the gallbladder. Dissection around this area demonstrated multiple vascularized adhesions especially on the right lateral side. These were doubly clipped and divided. The cystic artery was doubly clipped and divided in 2 branches, and then the cystic duct was easily visualized as it entered the infundibulum of the gallbladder. Here, it was doubly clipped and divided and the gallbladder segment and the gallbladder fossa with electrocautery passed out through the epigastric port site with the aid of an Endo Catch bag.   The area was checked for hemostasis. There was bleeding from one area of the visceral  peritoneum on the right lateral side of the gallbladder fossa. This was clipped multiple times to control arterial bleeding, then once assuring that hemostasis was adequate, the area was irrigated with copious amounts of normal saline. Hemostasis was again viewed to be adequate. The camera was placed in the epigastric site to view back to the periumbilical site. There was no sign of adhesions around the periumbilical site. No sign of bowel injury and no further bleeding or bile leak. Therefore, pneumoperitoneum was released. All ports were removed. Fascial edges at the enlarge epigastric port site were approximated with figure-of-eight 0 Vicryl sutures, then 4-0 subcuticular Monocryl was used on all skin edges. Steri-Strips, Mastisol, and sterile dressings were placed.   The patient tolerated the procedure well. There were no complications. She was taken to the recovery room in stable condition to be discharged in the care of her family.   Follow-up in 10 days.    ____________________________ Jerrol Banana Burt Knack, MD rec:np D: 06/20/2013 14:10:30 ET T: 06/20/2013 14:27:52 ET JOB#: [id number]  cc: Jerrol Banana. Burt Knack, MD, <Dictator> Delfino Lovett E  Kirubel Aja MD ELECTRONICALLY SIGNED  06/20/2013 15:49

## 2015-01-31 NOTE — H&P (Signed)
PATIENT NAME:  Abigail [name], Abigail [name] MR#:  [id number] DATE OF BIRTH:  Aug 15, 1966  DATE OF ADMISSION:  03/21/2013  CHIEF COMPLAINT: Nausea and vomiting.   HISTORY OF PRESENT ILLNESS: This is a patient who is 2 weeks postop laminectomy who has been on narcotics and anxiolytics,  who has been severely constipated. She had not had a bowel movement since surgery 2 weeks ago. The surgery was performed at [facility]. Yesterday she started vomiting. She denied any abdominal pain at that time, has no abdominal pain today, and states that the only time she had any abdominal pain was when someone pushed on her abdomen. She vomited multiple times yesterday and last night and then had a large bowel movement, the first one that she had had in 2 weeks. She denies fevers or chills, jaundice or acholic stools, and has never had an episode like this before and remains pain-free at this point.   I was asked to see the patient because of the findings of cholelithiasis, possible cholecystitis and elevated white blood cell count.   PAST MEDICAL HISTORY: Anxiety disease, hypertension and back surgery.   PAST SURGICAL HISTORY: Back surgery twice, hysterectomy done vaginally.   ALLERGIES: None.   MEDICATIONS: Multiple, see chart.   FAMILY HISTORY: Noncontributory.   SOCIAL HISTORY: The patient is on medical leave from a manufacturing position. Smokes tobacco. Does not drink alcohol.   REVIEW OF SYSTEMS: A 10-system review is performed and negative with the exception of that mentioned in the HPI.   PHYSICAL EXAMINATION: GENERAL: Healthy, comfortable-appearing Caucasian female patient.  VITAL SIGNS: Temperature 98.1, pulse 78, respirations 18, blood pressure 123/72, 96% room air sat. Pain scale is zero.  HEENT: No scleral icterus.  NECK: No palpable neck nodes.  CHEST: Clear to auscultation.  CARDIAC: Regular rate and rhythm.  ABDOMEN: Soft, nondistended and nontender.  EXTREMITIES: Without edema.  NEUROLOGIC:  Grossly intact.  INTEGUMENT: No jaundice. There is a wound with staples midline on her back with no erythema and no tenderness and no fluctuance.  LABORATORY AND RADIOLOGICAL DATA: CT scan is personally reviewed, suggesting large gallstones present and slightly thickened gallbladder wall, and ultrasound confirms gallstones but no thickening of the gallbladder wall.   White blood cell count is 21,000, hemoglobin and hematocrit 15 and 45, platelet count 385. Electrolytes are within normal limits, potassium depressed at 3.4, CO2 of 20. Normal LFTs.   ASSESSMENT AND PLAN: This is a patient I was asked to see for gallstone disease, but she is completely asymptomatic with respect to her gallstones. I believe that she was constipated secondary to narcotic and anxiolytics in a postoperative period. She started having nausea, vomiting as her chief complaint. My plan would be to admit the patient to the hospital, control her nausea and vomiting and re-examine. I doubt at this point that she has any sign of gallbladder disease. She is completely nontender and asymptomatic with respect to that.    ____________________________ Jerrol Banana. Burt Knack, MD rec:jm D: 03/21/2013 17:26:03 ET T: 03/21/2013 17:39:05 ET JOB#: [id number]  cc: Jerrol Banana. Burt Knack, MD, <Dictator> Florene Glen MD ELECTRONICALLY SIGNED 03/24/2013 11:22

## 2015-01-31 NOTE — H&P (Signed)
Subjective/Chief Complaint N/V   History of Present Illness severe constipation postop laminect on narcs and anxiolytics. vomiting last night then large BM. No abd pain. CT showed thickened GB wall [place] with stones, neg Murphy's   Past History anxiety, HTN tob abuse   Past Medical Health Hypertension, Smoking   Past Med/Surgical Hx:  hypertension:   amxiety:   back surgery:   ALLERGIES:  No Known Allergies:   HOME MEDICATIONS: Medication Instructions Status  acetaminophen-oxyCODONE 325 mg-10 mg oral tablet 1 tab(s) orally every 4-6 hours Active  methocarbamol 500 mg oral tablet 1 tab(s) orally 4 times a day Active  gabapentin 300 mg oral capsule 1 cap(s) orally once a day Active  PARoxetine 20 mg oral tablet 1 tab(s) orally once a day Active  meloxicam 15 mg oral tablet 1 tab(s) orally once a day Active  Norco 325 mg-5 mg oral tablet 1 tab(s) orally 2 times a day, As Needed - for Pain Active  ALPRAZolam 0.5 mg oral tablet 1 tab(s) orally 4 times a day Active  metoprolol extended release 50 mg oral tablet, extended release 1/2 tab orally 2 times a day Active  Azor 5 mg-20 mg oral tablet 1 tab(s) orally once a day Active   Family and Social History:  Family History Non-Contributory   Social History positive  tobacco, negative ETOH, manufact   + Tobacco Current (within 1 year)   Place of Living Home   Review of Systems:  Cough No   Sputum No   Abdominal Pain No   Diarrhea No   Constipation Yes   Nausea/Vomiting Yes   SOB/DOE No   Chest Pain No   Dysuria No   Tolerating Diet No  Nauseated  Vomiting   Medications/Allergies Reviewed Medications/Allergies reviewed   Physical Exam:  GEN no acute distress   HEENT pink conjunctivae   NECK supple   RESP normal resp effort  clear BS   CARD regular rate   ABD denies tenderness  soft  neg murphy's   LYMPH negative axillae   EXTR negative edema   SKIN normal to palpation, scar on back with staples,  no erythema   PSYCH alert, A+O to time, place, person, good insight   Lab Results: Hepatic:  11-Jun-14 03:40   Bilirubin, Total 0.9  Alkaline Phosphatase 73  SGPT (ALT) 39  SGOT (AST) 23  Total Protein, Serum 7.7  Albumin, Serum 4.2  Routine Chem:  11-Jun-14 03:40   Glucose, Serum  185  BUN  22  Creatinine (comp) 1.08  Sodium, Serum 140  Potassium, Serum  3.4  Chloride, Serum 103  CO2, Serum  20  Calcium (Total), Serum 9.2  Osmolality (calc) 288  eGFR (African American) >60  eGFR (Non-African American) >60 (eGFR values <33m/min/1.73 m2 may be an indication of chronic kidney disease (CKD). Calculated eGFR is useful in patients with stable renal function. The eGFR calculation will not be reliable in acutely ill patients when serum creatinine is changing rapidly. It is not useful in  patients on dialysis. The eGFR calculation may not be applicable to patients at the low and high extremes of body sizes, pregnant women, and vegetarians.)  Anion Gap  17  Lipase 91 (Result(s) reported on 21 Mar 2013 at 04:10AM.)  Cardiac:  11-Jun-14 03:40   CK, Total 102 (Result(s) reported on 21 Mar 2013 at 05:32AM.)  Routine Hem:  11-Jun-14 03:40   WBC (CBC)  20.8  RBC (CBC) 4.99  Hemoglobin (CBC) 15.3  Hematocrit (CBC)  44.9  Platelet Count (CBC) 385 (Result(s) reported on 21 Mar 2013 at 04:08AM.)  MCV 90  MCH 30.7  MCHC 34.1  RDW 12.6   Radiology Results: [place]:    11-Jun-14 07:46, US Abdomen Limited Survey  US Abdomen Limited Survey  REASON FOR EXAM:    ruq pain and nausea. gallstones noted on CT. surgeon   request [place]  COMMENTS:   Body Site: GB and Fossa, CBD, Head of Pancreas    PROCEDURE: [place]  - US ABDOMEN LIMITED SURVEY  - Mar 21 2013  7:46AM     RESULT: Comparison: None    Technique: Multiple gray-scale and color-flow Doppler images of the right   upper quadrant are presented for review.    Findings:    Visualized portions of the liver demonstrate normal echogenicity  and   normal contours. The liver is without evidence of a focal hepatic lesion.   There are cholelithiasis. There is no intra- or extrahepatic biliary   ductal dilatation. The common duct measures 3.1 mm in maximal diameter.   There is no gallbladder wall thickening, pericholecystic fluid, or   sonographicMurphy's sign.     The visualized portion of the pancreas is normal in echogenicity.    IMPRESSION:     Cholelithiasis without sonographic evidence of acute cholecystitis.    Dictation Site: 1        Verified By: Jennette Banker, M.D., MD  CT:    11-Jun-14 06:13, CT Abdomen and Pelvis With Contrast  CT Abdomen and Pelvis With Contrast  REASON FOR EXAM:    (1) nausea, LUQ/ LLQ pain. sp lumbar surgery. wbc   20.8; (2) nausea, LUQ/ LLQ pai  COMMENTS:       PROCEDURE: CT  - CT ABDOMEN / PELVIS  W  - Mar 21 2013  6:13AM     RESULT: Axial CT scanning was performed through the abdomen andpelvis   with reconstructions at 3 mm intervals and slice thicknesses. Review of   multiplanar reconstructed images was performed separately on the VIA   monitor.    There is marked abnormality of the gallbladder. Its wall is very   thickened. There is a rim calcified low density mass present within the   gallbladder lumen presumably large stone. Smaller calcified stones are   suspected as well. I do not see gas within the gallbladder.  The liver, pancreas, visualized portions of the common bile duct, spleen,   partially distended stomach, adrenal glands, and kidneys exhibit no acute   abnormalities. There are likely small cortical cysts in both kidneys. The   caliber of the abdominal aorta is normal. The unopacified loops of small   and large bowel exhibit no evidence of ileus nor of obstruction. A   structure consistent with a normal calibered uninflamed appendix is   demonstrated in the pelvis. The uterus is surgically absent. There is   mild fullness of the adnexal regions likely reflecting  small ovarian   cysts. There is no free fluid in the pelvis. There is no inguinal nor   umbilical hernia.    On delayed images contrast within the renal collecting systems appears   normal. The lumbar vertebral bodies are preserved in height. There is   mild disc space narrowing at L5 S1. There are surgical skin staples over   the lumbar region from recent surgery. The lung bases are clear.  IMPRESSION:    1. The gallbladder wall appears thickened and there are multiple rim  calcified stones present one of which measures as much as 2.6 cm. I do   not see pericholecystic fluid nor common bile ductal or intrahepatic   ductal dilation. The findings may reflect cholecystitis with   cholelithiasis. Certainly gallbladder neoplasm is not absolutely excluded   given the wall thickening. Further evaluation with gallbladder ultrasound   would be useful.  2. There are likely cortical cysts within both kidneys. There is no   evidence of obstruction or inflammation.  3. No acute bowel abnormality is demonstrated.  4. Soft tissue fullness in the adnexal regions likely reflects the   presence of ovarian cysts.  5. There are surgical skin staples over the lumbar region to the right of   midline. I do not see abnormal fluid collections in the subcutaneous or     deeper posterior spinous soft tissues.    A preliminary report was sent to the emergency department at the   conclusion of the study.     Dictation Site: 2        Verified By: DAVID A. Martinique, M.D., MD    Assessment/Admission Diagnosis constipation in postop period incidental gallstones with no tenderness leukocytosis admit hydrate reexamine   Electronic Signatures: Florene Glen (MD)  (Signed 11-Jun-14 12:49)  Authored: CHIEF COMPLAINT and HISTORY, PAST MEDICAL/SURGIAL HISTORY, ALLERGIES, HOME MEDICATIONS, FAMILY AND SOCIAL HISTORY, REVIEW OF SYSTEMS, PHYSICAL EXAM, LABS, Radiology, ASSESSMENT AND PLAN   Last Updated:  11-Jun-14 12:49 by Florene Glen (MD)

## 2015-01-31 NOTE — H&P (Signed)
PATIENT NAME:  Abigail [name], Abigail [name] MR#:  [id number] DATE OF BIRTH:  Feb 16, 1966  DATE OF ADMISSION:  06/20/2013  CHIEF COMPLAINT: Right upper quadrant pain.   HISTORY OF PRESENT ILLNESS: This is a patient with recurrent, episodic right upper quadrant pain associated with fatty food intolerance. The symptoms are occurring daily. She is here for elective laparoscopic cholecystectomy for control of her symptoms after work-up showed gallstones.   PAST MEDICAL HISTORY: Depression and hypertension.   REVIEW OF SYSTEMS: Ten system review has been performed and negative with the exception I mentioned in the HPI.  MEDICATIONS: Paxil, Indocin, Neurontin, Azor,  Toprol, aspirin, Xanax and Robaxin.   FAMILY HISTORY: Of diabetes and heart disease and hypertension.   PHYSICAL EXAMINATION: GENERAL: Healthy-appearing female patient, BMI of 23.  HEENT: Showed no scleral icterus.  NECK: No palpable neck nodes.  CHEST: Clear to auscultation.  CARDIAC: Regular rate and rhythm.  ABDOMEN: Soft, minimally tender in the right upper quadrant, no left upper quadrant tenderness. Negative Murphy's sign.  EXTREMITIES: Without edema.  NEUROLOGIC: Grossly intact.  INTEGUMENT: No jaundice.   LABORATORY DATA: Most recent laboratory values show a elevated white blood cell count of 16.6, H and H of 14 and 41 with a platelet count 238 with normal liver function tests. No sign of choledocholithiasis.   An ultrasound shows gallstones.   ASSESSMENT AND PLAN: This is a patient with symptomatic cholelithiasis. She is quite symptomatic and is here for elective laparoscopic cholecystectomy. Preoperatively, we discussed the rationale for surgery, the options of observation, risk of bleeding, infection, recurrence of symptoms, failure to resolve her symptoms, open procedure, bile duct damage, bile duct leak, retained common bile duct stone, any of which could require further surgery and/or ERCP stent and papillotomy. This will be be  reviewed for her again in the preop holding area and we will plan laparoscopic cholecystectomy.  ____________________________ Jerrol Banana Burt Knack, MD rec:sg D: 06/20/2013 08:51:39 ET T: 06/20/2013 09:11:16 ET JOB#: [id number]  cc: Jerrol Banana. Burt Knack, MD, <Dictator> Florene Glen MD ELECTRONICALLY SIGNED 06/20/2013 14:37

## 2015-01-31 NOTE — Discharge Summary (Signed)
PATIENT NAME:  Abigail [name], NIEHAUS MR#:  [id number] DATE OF BIRTH:  03-Jul-1966  DATE OF ADMISSION:  03/22/2013 DATE OF DISCHARGE:  03/24/2013  DIAGNOSES: 1.  Nausea, vomiting.  2.  Gallstones.  3.  Lumbar stenosis.  4.  Anxiety.  5.  Hypertension.   PROCEDURES:  None.   HISTORY OF PRESENT ILLNESS AND HOSPITAL COURSE:  This is a patient who is two weeks status post lumbar laminectomy for back pain and lumbar stenosis performed in [place].  She had not had a bowel movement since surgery and was presenting to the hospital with nausea and vomiting.  She had a bowel movement after the vomiting started and I was asked to see the patient out of the Emergency Room where gallstones were identified, however the patient admitted to absolutely no abdominal pain.  She only had nausea and vomiting.  The work-up showed gallstones and no other problems besides constipation.    The patient was so dehydrated and nauseated that we brought her into the hospital, controlled her nausea, vomiting.  I gave her enemas to improve her bowel habits which resulted in good bowel movements and the patient continued to have no abdominal pain relative to her gallstones.  She was tolerating a regular diet, was discharged in stable condition to follow up in my office to discuss possible cholecystectomy should her symptoms return.  She is also instructed to see her back surgeon on Monday for staple removal.  She is given no new medications.    ____________________________ Jerrol Banana Burt Knack, MD rec:ea D: 03/24/2013 11:52:52 ET T: 03/25/2013 04:19:35 ET JOB#: [id number]  cc: Jerrol Banana. Burt Knack, MD, <Dictator> Florene Glen MD ELECTRONICALLY SIGNED 03/25/2013 11:40

## 2015-02-01 ENCOUNTER — Emergency Department: Admit: 2015-02-01 | Discharge status: Against Medical Advice | Payer: Self-pay | Admitting: Emergency Medicine

## 2015-02-01 LAB — URINALYSIS, COMPLETE
BACTERIA: NONE SEEN
BILIRUBIN, UR: NEGATIVE
Glucose,UR: NEGATIVE mg/dL (ref 0–75)
KETONE: NEGATIVE
Leukocyte Esterase: NEGATIVE
Nitrite: NEGATIVE
PROTEIN: NEGATIVE
Ph: 5 (ref 4.5–8.0)
Specific Gravity: 1.026 (ref 1.003–1.030)

## 2015-02-01 LAB — BASIC METABOLIC PANEL
ANION GAP: 7 (ref 7–16)
BUN: 22 mg/dL — ABNORMAL HIGH
CALCIUM: 8.8 mg/dL — AB
Chloride: 109 mmol/L
Co2: 24 mmol/L
Creatinine: 0.8 mg/dL
EGFR (Non-African Amer.): >60
GLUCOSE: 100 mg/dL — AB
Potassium: 3.5 mmol/L
Sodium: 140 mmol/L

## 2015-02-01 LAB — CBC WITH DIFFERENTIAL/PLATELET
Basophil #: 0.1 10*3/uL (ref 0.0–0.1)
Basophil %: 0.6 %
EOS ABS: 0.2 10*3/uL (ref 0.0–0.7)
Eosinophil %: 1.6 %
HCT: 39.7 % (ref 35.0–47.0)
HGB: 13.5 g/dL (ref 12.0–16.0)
Lymphocyte #: 4.1 10*3/uL — ABNORMAL HIGH (ref 1.0–3.6)
Lymphocyte %: 37.6 %
MCH: 30.5 pg (ref 26.0–34.0)
MCHC: 34.1 g/dL (ref 32.0–36.0)
MCV: 89 fL (ref 80–100)
MONOS PCT: 5.6 %
Monocyte #: 0.6 x10 3/mm (ref 0.2–0.9)
Neutrophil #: 5.9 10*3/uL (ref 1.4–6.5)
Neutrophil %: 54.6 %
PLATELETS: 254 10*3/uL (ref 150–440)
RBC: 4.44 10*6/uL (ref 3.80–5.20)
RDW: 12.6 % (ref 11.5–14.5)
WBC: 10.8 10*3/uL (ref 3.6–11.0)

## 2015-02-01 LAB — TROPONIN I: Troponin-I: <0.03 ng/mL

## 2015-02-01 NOTE — H&P (Signed)
PATIENT NAME:  Abigail [name], Abigail [name] MR#:  [id number] DATE OF BIRTH:  Jun 13, 1966  DATE OF ADMISSION:  09/25/2013  PRIMARY CARE PHYSICIAN: Theora Gianotti.   NEUROLOGY: Dr. Melrose Nakayama.   REFERRING PHYSICIAN: Delene Loll, PA in the Emergency Department.   CHIEF COMPLAINT: Back pain, weakness in both legs.   HISTORY OF PRESENT ILLNESS: Abigail [name] is a 49 year old female with history of chronic back pain, anemia, continued tobacco use, panic attacks and hypertension. Has been experiencing pain since September 2013. Per the patient, underwent back surgery with L5-S1 fusion for a bulging disk without improvement of the pain. Since September 2014, started to experience pain in the left leg. Experiences weakness with walking. Concerning this, went to Dr. Melrose Nakayama who ordered an MRI of the lower the spine. Concerning about the severe pain, called Dr. Lannie Fields office for pain control who recommended the patient to go to the Emergency Department. The patient denies having  urinary retention or fecal incontinence. The patient states uncontrollable pain. Denies having any swelling or weakness in any part of the leg; however, experiences some tingling sensation.   PAST MEDICAL HISTORY:  1. Osteoarthritis.  2. Anemia.  3. Tobacco use.  4. Panic attacks.  5. Hypertension.  6. Anxiety.   PAST SURGICAL HISTORY:  1. Cholecystectomy.  2. Hysterectomy.  3. Back surgery.   ALLERGIES: No known drug allergies.   HOME MEDICATIONS:  1. Toprol-XL 25 mg 2 times a day.  2. Robaxin 500 mg 2 tablets 4 times a day.  3. Paxil 20 mg once a day.  4. Neurontin 300 mg 3 times a day.  5. Celebrex 200 mg 2 times a day.  6. Azor 5/20 one tablet once a day.  7. Aspirin 81 mg once a day.  8. Alprazolam 1 tablet 4 times a day as needed.  9. Norco 5/325 mg every 6 hours as needed.   SOCIAL HISTORY: Continues to smoke 1/2 pack a day. Denies drinking alcohol or using illicit drugs. Lives with her husband.   FAMILY HISTORY: History of  osteoarthritis.   REVIEW OF SYSTEMS:  CONSTITUTIONAL: Generalized weakness.  EYES: No change in vision.  ENT: No change in hearing.  RESPIRATORY: No cough, shortness of breath.  CARDIOVASCULAR: No chest pain, palpitations.  GASTROINTESTINAL: No nausea, vomiting, abdominal pain.  GENITOURINARY: No dysuria or hematuria.  ENDOCRINE: No polyuria or polydipsia.  HEMATOLOGIC: No easy bruising or bleeding.  SKIN: No rash or lesions.  MUSCULOSKELETAL: Has back pain.  NEUROLOGIC: No weakness or numbness in any part of the body.  PSYCHIATRIC: The patient states feeling depressed secondary to pain.   PHYSICAL EXAMINATION:  GENERAL: This is well-built, well-nourished, age-appropriate female lying down in the bed, not in distress.  VITAL SIGNS: Temperature 98, pulse 57, blood pressure 115/61, respiratory rate of 16, oxygen saturation is 96% on room air.  HEENT: Head normocephalic, atraumatic. There is no scleral icterus. Conjunctivae normal. Pupils equal and react to light. Extraocular movements are intact. Mucous membranes moist. No pharyngeal erythema.  NECK: Supple. No lymphadenopathy. No JVD. No carotid bruit.  CHEST: Has no focal tenderness.  LUNGS: Bilaterally clear to auscultation.  HEART: S1 and S2 regular. No murmurs are heard.  ABDOMEN: Bowel sounds present. Soft, nontender, nondistended. No hepatosplenomegaly.  EXTREMITIES: No pedal edema. Pulses 2+.  NEUROLOGIC: The patient is alert, oriented to place, person and time. Cranial nerves II through XII intact. Motor 5/5 in upper and lower extremities. Babinski downgoing. Has pain with leg extension on both sides of the legs.  SKIN: No rash or lesions.  MUSCULOSKELETAL: Good range of motion in all of the extremities.   LABORATORIES: CBC: WBC of 14.7, hemoglobin 15, platelet count of 259. The rest of all of the values are within normal limits.   BMP is completely within normal limits.   MRI of the spine shows L5-S1 subarticular disk  extrusion and likely mass effect on the traversing left S1 nerve. Sequelae of right L5-S1 hemilaminectomy. Mild to moderate bilateral neuroforaminal narrowing at L5-S1.   ASSESSMENT AND PLAN: Abigail [name] is a 49 year old female with a history of chronic back pain. Comes to the Emergency Department with worsening of the pain.  1. Chronic back pain: MRI shows L5-S1 subarticular disk extrusion, possible mass effect on the traversing left S1 nerve. Dr. Melrose Nakayama requested the patient to be admitted and will evaluate the patient in the morning. Continue with pain management with Toradol. Continue with home medications.  2. Hypertension: Currently well controlled. Continue with home medications.  3. Tobacco use: Counseled with the patient.  4. Keep the patient on deep vein thrombosis prophylaxis with Lovenox.   TIME SPENT: 45 minutes.    ____________________________ Monica Becton, MD pv:gb D: 09/26/2013 00:42:44 ET T: 09/26/2013 01:15:20 ET JOB#: [id number]  cc: Monica Becton, MD, <Dictator> Dr. Helayne [place] Faruq Rosenberger MD ELECTRONICALLY SIGNED 10/19/2013 21:08

## 2015-03-27 ENCOUNTER — Ambulatory Visit
Admission: RE | Admit: 2015-03-27 | Discharge: 2015-03-27 | Disposition: A | Discharge status: Home / Self Care | Payer: 59 | Source: Ambulatory Visit | Attending: Family Medicine | Admitting: Family Medicine

## 2015-03-27 DIAGNOSIS — Z1231 Encounter for screening mammogram for malignant neoplasm of breast: Secondary | ICD-10-CM | POA: Insufficient documentation

## 2015-04-01 ENCOUNTER — Other Ambulatory Visit: Payer: Self-pay | Admitting: Family Medicine

## 2015-04-01 DIAGNOSIS — N63 Unspecified lump in unspecified breast: Secondary | ICD-10-CM

## 2015-04-01 DIAGNOSIS — R928 Other abnormal and inconclusive findings on diagnostic imaging of breast: Secondary | ICD-10-CM

## 2015-04-03 ENCOUNTER — Ambulatory Visit
Admission: RE | Admit: 2015-04-03 | Discharge: 2015-04-03 | Disposition: A | Discharge status: Home / Self Care | Payer: 59 | Source: Ambulatory Visit | Attending: Family Medicine | Admitting: Family Medicine

## 2015-04-03 ENCOUNTER — Other Ambulatory Visit: Payer: Self-pay | Admitting: Family Medicine

## 2015-04-03 DIAGNOSIS — R928 Other abnormal and inconclusive findings on diagnostic imaging of breast: Secondary | ICD-10-CM

## 2015-04-03 DIAGNOSIS — N63 Unspecified lump in unspecified breast: Secondary | ICD-10-CM

## 2015-04-04 ENCOUNTER — Other Ambulatory Visit: Payer: Self-pay | Admitting: Family Medicine

## 2015-04-04 DIAGNOSIS — R928 Other abnormal and inconclusive findings on diagnostic imaging of breast: Secondary | ICD-10-CM

## 2015-04-04 DIAGNOSIS — N63 Unspecified lump in unspecified breast: Secondary | ICD-10-CM

## 2015-04-10 ENCOUNTER — Other Ambulatory Visit: Payer: Self-pay | Admitting: Family Medicine

## 2015-04-10 ENCOUNTER — Ambulatory Visit
Admission: RE | Admit: 2015-04-10 | Discharge: 2015-04-10 | Disposition: A | Discharge status: Home / Self Care | Payer: 59 | Source: Ambulatory Visit | Attending: Family Medicine | Admitting: Family Medicine

## 2015-04-10 DIAGNOSIS — N63 Unspecified lump in unspecified breast: Secondary | ICD-10-CM

## 2015-04-10 DIAGNOSIS — N62 Hypertrophy of breast: Secondary | ICD-10-CM | POA: Insufficient documentation

## 2015-04-10 DIAGNOSIS — R921 Mammographic calcification found on diagnostic imaging of breast: Secondary | ICD-10-CM | POA: Diagnosis not present

## 2015-04-10 DIAGNOSIS — R928 Other abnormal and inconclusive findings on diagnostic imaging of breast: Secondary | ICD-10-CM

## 2015-04-10 DIAGNOSIS — D242 Benign neoplasm of left breast: Secondary | ICD-10-CM | POA: Insufficient documentation

## 2015-04-10 HISTORY — PX: BREAST BIOPSY: SHX20

## 2015-04-11 LAB — SURGICAL PATHOLOGY

## 2015-11-05 ENCOUNTER — Telehealth: Payer: Self-pay | Admitting: Neurology

## 2015-11-05 NOTE — Telephone Encounter (Signed)
VM-Michelle called in regards to PT and would like a call back at [phone]

## 2015-11-05 NOTE — Telephone Encounter (Signed)
Spoke with Sharyn Lull. Will refax record request.

## 2016-02-12 DIAGNOSIS — G4700 Insomnia, unspecified: Secondary | ICD-10-CM | POA: Insufficient documentation

## 2016-02-12 DIAGNOSIS — I10 Essential (primary) hypertension: Secondary | ICD-10-CM | POA: Insufficient documentation

## 2016-02-12 DIAGNOSIS — F329 Major depressive disorder, single episode, unspecified: Secondary | ICD-10-CM | POA: Insufficient documentation

## 2016-02-12 DIAGNOSIS — E7800 Pure hypercholesterolemia, unspecified: Secondary | ICD-10-CM | POA: Insufficient documentation

## 2016-02-12 DIAGNOSIS — F32A Depression, unspecified: Secondary | ICD-10-CM | POA: Insufficient documentation

## 2016-03-11 DIAGNOSIS — M159 Polyosteoarthritis, unspecified: Secondary | ICD-10-CM | POA: Insufficient documentation

## 2016-04-23 ENCOUNTER — Other Ambulatory Visit: Payer: Self-pay | Admitting: Family Medicine

## 2016-04-23 DIAGNOSIS — Z1231 Encounter for screening mammogram for malignant neoplasm of breast: Secondary | ICD-10-CM

## 2016-05-06 ENCOUNTER — Ambulatory Visit
Admission: RE | Admit: 2016-05-06 | Discharge: 2016-05-06 | Disposition: A | Discharge status: Home / Self Care | Payer: 59 | Source: Ambulatory Visit | Attending: Family Medicine | Admitting: Family Medicine

## 2016-05-06 ENCOUNTER — Other Ambulatory Visit: Payer: Self-pay | Admitting: Family Medicine

## 2016-05-06 DIAGNOSIS — Z1231 Encounter for screening mammogram for malignant neoplasm of breast: Secondary | ICD-10-CM | POA: Diagnosis not present

## 2016-07-28 ENCOUNTER — Ambulatory Visit (INDEPENDENT_AMBULATORY_CARE_PROVIDER_SITE_OTHER): Payer: 59 | Admitting: Sports Medicine

## 2016-07-28 DIAGNOSIS — M1712 Unilateral primary osteoarthritis, left knee: Secondary | ICD-10-CM

## 2016-07-28 DIAGNOSIS — F17210 Nicotine dependence, cigarettes, uncomplicated: Secondary | ICD-10-CM | POA: Diagnosis not present

## 2016-07-28 DIAGNOSIS — M545 Low back pain: Secondary | ICD-10-CM

## 2016-09-01 ENCOUNTER — Ambulatory Visit (INDEPENDENT_AMBULATORY_CARE_PROVIDER_SITE_OTHER): Payer: Self-pay | Admitting: Sports Medicine

## 2017-04-22 ENCOUNTER — Other Ambulatory Visit: Payer: Self-pay | Admitting: Family Medicine

## 2017-04-22 DIAGNOSIS — Z1231 Encounter for screening mammogram for malignant neoplasm of breast: Secondary | ICD-10-CM

## 2017-05-10 ENCOUNTER — Ambulatory Visit
Admission: RE | Admit: 2017-05-10 | Discharge: 2017-05-10 | Disposition: A | Discharge status: Home / Self Care | Payer: 59 | Source: Ambulatory Visit | Attending: Family Medicine | Admitting: Family Medicine

## 2017-05-10 DIAGNOSIS — Z1231 Encounter for screening mammogram for malignant neoplasm of breast: Secondary | ICD-10-CM | POA: Insufficient documentation

## 2017-09-05 ENCOUNTER — Other Ambulatory Visit: Payer: Self-pay | Admitting: Family Medicine

## 2017-09-05 DIAGNOSIS — E236 Other disorders of pituitary gland: Secondary | ICD-10-CM

## 2017-09-05 DIAGNOSIS — R531 Weakness: Secondary | ICD-10-CM

## 2017-09-05 DIAGNOSIS — H539 Unspecified visual disturbance: Secondary | ICD-10-CM

## 2017-09-08 ENCOUNTER — Emergency Department: Payer: 59

## 2017-09-08 ENCOUNTER — Encounter: Payer: Self-pay | Admitting: Emergency Medicine

## 2017-09-08 ENCOUNTER — Emergency Department
Admission: EM | Admit: 2017-09-08 | Discharge: 2017-09-08 | Disposition: A | Discharge status: Home / Self Care | Payer: 59 | Attending: Emergency Medicine | Admitting: Emergency Medicine

## 2017-09-08 DIAGNOSIS — R110 Nausea: Secondary | ICD-10-CM | POA: Insufficient documentation

## 2017-09-08 DIAGNOSIS — F17210 Nicotine dependence, cigarettes, uncomplicated: Secondary | ICD-10-CM | POA: Insufficient documentation

## 2017-09-08 DIAGNOSIS — I10 Essential (primary) hypertension: Secondary | ICD-10-CM | POA: Insufficient documentation

## 2017-09-08 DIAGNOSIS — Z8673 Personal history of transient ischemic attack (TIA), and cerebral infarction without residual deficits: Secondary | ICD-10-CM | POA: Insufficient documentation

## 2017-09-08 DIAGNOSIS — R519 Headache, unspecified: Secondary | ICD-10-CM

## 2017-09-08 DIAGNOSIS — Z79899 Other long term (current) drug therapy: Secondary | ICD-10-CM | POA: Insufficient documentation

## 2017-09-08 DIAGNOSIS — R51 Headache: Secondary | ICD-10-CM | POA: Diagnosis not present

## 2017-09-08 LAB — CBC
HCT: 43.8 % (ref 35.0–47.0)
Hemoglobin: 14.7 g/dL (ref 12.0–16.0)
MCH: 30.5 pg (ref 26.0–34.0)
MCHC: 33.5 g/dL (ref 32.0–36.0)
MCV: 91 fL (ref 80.0–100.0)
Platelets: 259 10*3/uL (ref 150–440)
RBC: 4.81 MIL/uL (ref 3.80–5.20)
RDW: 12.4 % (ref 11.5–14.5)
WBC: 13.5 10*3/uL — ABNORMAL HIGH (ref 3.6–11.0)

## 2017-09-08 LAB — BASIC METABOLIC PANEL
ANION GAP: 10 (ref 5–15)
BUN: 17 mg/dL (ref 6–20)
CO2: 26 mmol/L (ref 22–32)
Calcium: 8.9 mg/dL (ref 8.9–10.3)
Chloride: 102 mmol/L (ref 101–111)
Creatinine, Ser: 0.92 mg/dL (ref 0.44–1.00)
GFR calc Af Amer: >60 mL/min (ref >60)
GFR calc non Af Amer: >60 mL/min (ref >60)
GLUCOSE: 89 mg/dL (ref 65–99)
POTASSIUM: 4.2 mmol/L (ref 3.5–5.1)
Sodium: 138 mmol/L (ref 135–145)

## 2017-09-08 LAB — TSH: TSH: 2.317 u[IU]/mL (ref 0.350–4.500)

## 2017-09-08 LAB — SEDIMENTATION RATE: Sed Rate: 4 mm/hr (ref 0–20)

## 2017-09-08 MED ORDER — KETOROLAC TROMETHAMINE 10 MG PO TABS: 10.0000 mg | ORAL_TABLET | Freq: Three times a day (TID) | ORAL | 0 refills | Status: DC | PRN

## 2017-09-08 MED ORDER — SODIUM CHLORIDE 0.9 % IV BOLUS (SEPSIS)
1000.0000 mL | Freq: Once | INTRAVENOUS | Status: AC
  Administered 2017-09-08: 1000 mL via INTRAVENOUS

## 2017-09-08 MED ORDER — KETOROLAC TROMETHAMINE 30 MG/ML IJ SOLN
30.0000 mg | Freq: Once | INTRAMUSCULAR | Status: AC
  Administered 2017-09-08: 30 mg via INTRAVENOUS
  Filled 2017-09-08: qty 1

## 2017-09-08 MED ORDER — ONDANSETRON HCL 4 MG/2ML IJ SOLN
4.0000 mg | Freq: Once | INTRAMUSCULAR | Status: AC
  Administered 2017-09-08: 4 mg via INTRAVENOUS
  Filled 2017-09-08: qty 2

## 2017-09-08 MED ORDER — ONDANSETRON 4 MG PO TBDP: 4.0000 mg | ORAL_TABLET | Freq: Three times a day (TID) | ORAL | 0 refills | Status: DC | PRN

## 2017-09-08 NOTE — ED Provider Notes (Signed)
[facility] Emergency Department Provider Note  ____________________________________________  Time seen: Approximately 3:48 PM  I have reviewed the triage vital signs and the nursing notes.   HISTORY  Chief Complaint Headache    HPI Abigail [name] is a 51 y.o. female with a history of recurrent complicated headache presenting for headache.  The patient reports that for the past 4 months, she has had an intermittent headache which starts at the right temple, then radiates around the entire head and into the neck.  She has been followed by her PMD for this, who ordered a MRI of the C-spine 11/4, which she brings with her today, with no acute process although there is possible enlargement of the sella.  He also has an MRI of the brain scheduled for next week, but she "cannot wait because the headache is so bad."  She has no new change in severity or character of the headache.  She has tried Tylenol and tramadol, which she states is not working.  She brings me a list of 24 different symptoms that she has been having for the past 5 months, which includes intermittent blurry and double vision right-sided weakness, right-sided numbness, difficulty washing and drying her hair, left knee locking up, feeling irritable, and a multitude of other symptoms.  FH: mother  With temporal arteritis  Past Medical History:  Diagnosis Date  . Arthritis    arthritis-back, weakness in right leg  . Complication of anesthesia    headache when come out of anesthesia  . Headache(784.0)    occ. migraines, not frequent  . Hypertension   . Syncope     Patient Active Problem List   Diagnosis Date Noted  . TIA (transient ischemic attack) 06/02/2014  . Hypertension 06/02/2014  . Paresthesias 06/02/2014  . Herniated lumbar intervertebral disc 09/28/2013  . Recurrent herniation of lumbar disc 09/27/2013  . Spinal stenosis, lumbar region, with neurogenic claudication 03/07/2013  . Lumbar  disc herniation with radiculopathy 03/07/2013  . Intervertebral disc disorder with myelopathy, lumbar region 07/13/2012  . Spinal stenosis, lumbar 07/13/2012    Past Surgical History:  Procedure Laterality Date  . ABDOMINAL HYSTERECTOMY  14 yrs ago  . BREAST BIOPSY Left 04/10/2015   BENIGN BREAST TISSUE  . HEMI-MICRODISCECTOMY LUMBAR LAMINECTOMY LEVEL 1 N/A 09/28/2013   Procedure: HEMI-MICRODISCECTOMY LUMBAR LAMINECTOMY L5-S1 LEFT;  Surgeon: Tobi Bastos, MD;  Location: [facility];  Service: Orthopedics;  Laterality: N/A;  . left foot bone spur surgery  10 yrs ago  . LUMBAR LAMINECTOMY/DECOMPRESSION MICRODISCECTOMY  07/13/2012   Procedure: LUMBAR LAMINECTOMY/DECOMPRESSION MICRODISCECTOMY 1 LEVEL;  Surgeon: Tobi Bastos, MD;  Location: [facility];  Service: Orthopedics;  Laterality: N/A;  Central Decompression Lumbar Laminectomy L5 - S1 (X-Ray)  . LUMBAR LAMINECTOMY/DECOMPRESSION MICRODISCECTOMY Right 03/07/2013   Procedure: LUMBAR DECOMPRESSIVE HEMI LAMINECTOMY AND  MICRODISCECTOMY  L5-S1 RIGHT ;  Surgeon: Tobi Bastos, MD;  Location: [facility];  Service: Orthopedics;  Laterality: Right;    Current Outpatient Rx  . Order #: [id number] Class: Historical Med  . Order #: [id number] Class: Historical Med  . Order #: [id number] Class: Historical Med  . Order #: [id number] Class: Historical Med  . Order #: [id number] Class: Historical Med  . Order #: [id number] Class: Print  . Order #: [id number] Class: Print  . Order #: [id number] Class: Historical Med  . Order #: [id number] Class: Print  . Order #: [id number] Class: Print  . Order #: [id number] Class: Historical Med  . Order #: [id number] Class: Historical Med    Allergies Patient has no  known allergies.  Family History  Problem Relation Age of Onset  . Ataxia Neg Hx   . Chorea Neg Hx   . Dementia Neg Hx   . Mental retardation Neg Hx   . Migraines Neg Hx   . Multiple sclerosis Neg Hx   . Neurofibromatosis Neg Hx   . Neuropathy Neg Hx   . Parkinsonism Neg  Hx   . Seizures Neg Hx   . Stroke Neg Hx     Social History Social History   Tobacco Use  . Smoking status: Current Every Day Smoker    Packs/day: 0.50    Years: 21.00    Pack years: 10.50    Types: Cigarettes  . Smokeless tobacco: Never Used  . Tobacco comment: is aware she needs to quit   Substance Use Topics  . Alcohol use: Yes    Comment: rare  . Drug use: No    Review of Systems Constitutional: No fever/chills.  No trauma.  No general malaise.  Positive lightheadedness without syncope. Eyes: Intermittent blurry vision.  Intermittent double vision. ENT: No sore throat. No congestion or rhinorrhea.  Difficulty washing and drying her hair. Cardiovascular: Denies chest pain. Denies palpitations. Respiratory: Denies shortness of breath.  No cough. Gastrointestinal: No abdominal pain.  No nausea, no vomiting.  No diarrhea.  No constipation. Genitourinary: Negative for dysuria. Musculoskeletal: Positive diffuse joint pain. Skin: Negative for rash. Neurological: Positive for headache.  Positive for right-sided weakness, numbness.  Now positive for left-sided weakness and numbness.  Positive visual changes.  Psychiatric:Positive" irritable and snappy". Endocrine: Positive cold flashes and hot flashes  ____________________________________________   PHYSICAL EXAM:  VITAL SIGNS: ED Triage Vitals  Enc Vitals Group     BP 09/08/17 1306 123/81     Pulse Rate 09/08/17 1306 76     Resp 09/08/17 1306 18     Temp 09/08/17 1306 98.5 F (36.9 C)     Temp Source 09/08/17 1306 Oral     SpO2 09/08/17 1306 95 %     Weight 09/08/17 1302 175 lb (79.4 kg)     Height 09/08/17 1302 5\' 5"  (1.651 m)     Head Circumference --      Peak Flow --      Pain Score 09/08/17 1302 9     Pain Loc --      Pain Edu? --      Excl. in [place]? --     Constitutional: The patient is alert and oriented and answering questions appropriately.  Her GCS is 15.  She is comfortable in the stretcher unable to  move about without any difficulty. Eyes: Conjunctivae are normal.  EOMI. horizontal nystagmus.  No scleral icterus. Head: Atraumatic.  The patient has tenderness to palpation over both temples, but also anywhere else on her face that I touch. Nose: No congestion/rhinnorhea. Mouth/Throat: Mucous membranes are moist.  Neck: No stridor.  Supple.  No meningismus. Cardiovascular: Normal rate, regular rhythm. No murmurs, rubs or gallops.  Respiratory: Normal respiratory effort.  No accessory muscle use or retractions. Lungs CTAB.  No wheezes, rales or ronchi. Gastrointestinal: Soft, nontender and nondistended.  No guarding or rebound.  No peritoneal signs. Musculoskeletal: No LE edema. No ttp in the calves or palpable cords.  Negative Homan's sign. Neurologic: Alert and oriented 3. Speech is clear.  Face and smile symmetric. Tongue is midline.  EOMI.  PERRLA.  No horizontal or vertical nystagmus.  No pronator drift. 5 out of 5 grip, biceps, triceps,  hip flexors, plantar flexion and dorsiflexion.  Decreased sensation to light touch in the right upper and lower extremities, and right face. Normal gait without ataxia. Skin:  Skin is warm, dry and intact. No rash noted. Psychiatric: Mood and affect are normal.  ____________________________________________   LABS (all labs ordered are listed, but only abnormal results are displayed)  Labs Reviewed  CBC - Abnormal; Notable for the following components:      Result Value   WBC 13.5 (*)    All other components within normal limits  BASIC METABOLIC PANEL  TSH  SEDIMENTATION RATE  URINALYSIS, COMPLETE (UACMP) WITH MICROSCOPIC   ____________________________________________  EKG  ED ECG REPORT I, Eula Listen, the attending physician, personally viewed and interpreted this ECG.   Date: 09/08/2017  EKG Time: 1821  Rate: 59  Rhythm: sinus bradycardia  Axis: normal  Intervals:none  ST&T Change: No  STEMI  ____________________________________________  RADIOLOGY  Mr Brain Wo Contrast  Result Date: 09/08/2017 CLINICAL DATA:  Intermittent headache for 4 months. Patient was scheduled for outpatient MRI but pain is worsened and patient presents to the ER. EXAM: MRI HEAD WITHOUT CONTRAST TECHNIQUE: Multiplanar, multiecho pulse sequences of the brain and surrounding structures were obtained without intravenous contrast. COMPARISON:  06/03/2014 FINDINGS: Brain: No infarction, hemorrhage, hydrocephalus, extra-axial collection or mass lesion. Xanthogranulomas of the choroid plexus incidentally noted. No atrophy or chronic blood products. Rare FLAIR hyperintensities in the cerebral white matter to a degree that is commonly seen at patient's age, nonprogressive. Vascular: Major flow voids are preserved. Skull and upper cervical spine: Negative for marrow lesion. C2-3 and C3-4 facet arthropathy with slight anterolisthesis. Sinuses/Orbits: Negative.  No sinusitis or mastoiditis. Other: Small retention cysts in the nasopharynx. IMPRESSION: Negative exam.  No explanation for headache. Electronically Signed   By: Monte Fantasia M.D.   On: 09/08/2017 18:10    ____________________________________________   PROCEDURES  Procedure(s) performed: None  Procedures  Critical Care performed: No ____________________________________________   INITIAL IMPRESSION / ASSESSMENT AND PLAN / ED COURSE  Pertinent labs & imaging results that were available during my care of the patient were reviewed by me and considered in my medical decision making (see chart for details).  51 y.o. female with 4 months of multiple diffuse symptoms that are difficult to correlate to a single lesion in the brain.  I will get a sed rate to evaluate for temporal arteritis given her family history of temporal arteritis and pain over the temples.  The patient will undergo MRI of the brain.  I do not see any evidence of meningitis today.   We will also get a TSH to evaluate for thyroid disease.  I am awaiting the results of her electrolytes and blood counts.  Overall, the patient's workup in the emergency department is reassuring, we will plan to have her follow-up as an outpatient with a neurologist.  Plan re-evaluation for final disposition.  ----------------------------------------- 6:38 PM on 09/08/2017 -----------------------------------------  Patient's workup in the emergency department is reassuring.  She has normal electrolytes and is not anemic.  Her TSH is normal and her sed rate is 4.  She has an MRI of the brain which does not show any acute intracranial process.  At this time, the patient will be safe for discharge to continue follow-up with her primary care physician.  I will also give her a referral to neurology for outpatient evaluation of her chronic headaches.  Plan discharge at this time.  Return precautions as well as follow-up instructions  were discussed.  ____________________________________________  FINAL CLINICAL IMPRESSION(S) / ED DIAGNOSES  Final diagnoses:  Daily headache  Nausea         NEW MEDICATIONS STARTED DURING THIS VISIT:  This SmartLink is deprecated. Use AVSMEDLIST instead to display the medication list for a patient.    Eula Listen, MD 09/08/17 [phone]

## 2017-09-08 NOTE — ED Notes (Signed)
Pt speaking with mri

## 2017-09-08 NOTE — Discharge Instructions (Signed)
Please return to the emergency department for severe pain, numbness tingling or weakness, visual changes, fever, or any other symptoms concerning to you.

## 2017-09-08 NOTE — ED Notes (Signed)
Pt discharged - husband took her clothes, but pt given discharge instructions and signed paperwork

## 2017-09-08 NOTE — ED Notes (Signed)
Pt to mri 

## 2017-09-08 NOTE — ED Triage Notes (Signed)
Pt comes into the ED via POV c/o headache intermittently for 4 months.  Patient is scheduled for an MRI next week but the patient is unable to make it until then due to the pain getting worse.  Patient states she has mild photosensitivity but denies any nausea at this time.  Patient is neurologically intact at this time.

## 2017-09-14 ENCOUNTER — Other Ambulatory Visit: Payer: Medicare Other

## 2017-09-14 DIAGNOSIS — G479 Sleep disorder, unspecified: Secondary | ICD-10-CM | POA: Insufficient documentation

## 2017-09-14 DIAGNOSIS — R519 Headache, unspecified: Secondary | ICD-10-CM | POA: Insufficient documentation

## 2017-09-14 DIAGNOSIS — R51 Headache: Secondary | ICD-10-CM

## 2017-09-14 DIAGNOSIS — R0683 Snoring: Secondary | ICD-10-CM | POA: Insufficient documentation

## 2017-09-22 DIAGNOSIS — R531 Weakness: Secondary | ICD-10-CM | POA: Insufficient documentation

## 2017-09-28 DIAGNOSIS — M545 Low back pain: Secondary | ICD-10-CM

## 2017-09-28 DIAGNOSIS — G8929 Other chronic pain: Secondary | ICD-10-CM | POA: Insufficient documentation

## 2017-09-28 DIAGNOSIS — N3946 Mixed incontinence: Secondary | ICD-10-CM | POA: Insufficient documentation

## 2017-10-07 ENCOUNTER — Ambulatory Visit: Payer: 59 | Attending: Neurology

## 2017-10-07 DIAGNOSIS — G4761 Periodic limb movement disorder: Secondary | ICD-10-CM | POA: Diagnosis not present

## 2017-10-07 DIAGNOSIS — Z8669 Personal history of other diseases of the nervous system and sense organs: Secondary | ICD-10-CM | POA: Insufficient documentation

## 2017-10-07 DIAGNOSIS — R0683 Snoring: Secondary | ICD-10-CM | POA: Diagnosis present

## 2017-10-07 DIAGNOSIS — Z09 Encounter for follow-up examination after completed treatment for conditions other than malignant neoplasm: Secondary | ICD-10-CM | POA: Diagnosis not present

## 2017-10-07 DIAGNOSIS — G4733 Obstructive sleep apnea (adult) (pediatric): Secondary | ICD-10-CM | POA: Diagnosis present

## 2017-10-10 DIAGNOSIS — G5603 Carpal tunnel syndrome, bilateral upper limbs: Secondary | ICD-10-CM | POA: Insufficient documentation

## 2017-10-18 DIAGNOSIS — R51 Headache: Secondary | ICD-10-CM

## 2017-10-18 DIAGNOSIS — R519 Headache, unspecified: Secondary | ICD-10-CM | POA: Insufficient documentation

## 2017-12-27 DIAGNOSIS — M25552 Pain in left hip: Secondary | ICD-10-CM | POA: Insufficient documentation

## 2018-01-11 ENCOUNTER — Other Ambulatory Visit: Payer: Self-pay | Admitting: "Endocrinology

## 2018-01-11 DIAGNOSIS — E236 Other disorders of pituitary gland: Secondary | ICD-10-CM | POA: Insufficient documentation

## 2018-01-18 ENCOUNTER — Ambulatory Visit
Admission: RE | Admit: 2018-01-18 | Discharge: 2018-01-18 | Disposition: A | Discharge status: Home / Self Care | Payer: 59 | Source: Ambulatory Visit | Attending: "Endocrinology | Admitting: "Endocrinology

## 2018-01-18 DIAGNOSIS — E236 Other disorders of pituitary gland: Secondary | ICD-10-CM | POA: Diagnosis present

## 2018-01-18 LAB — POCT I-STAT CREATININE: CREATININE: 1.1 mg/dL — AB (ref 0.44–1.00)

## 2018-01-18 MED ORDER — GADOBENATE DIMEGLUMINE 529 MG/ML IV SOLN
10.0000 mL | Freq: Once | INTRAVENOUS | Status: AC | PRN
  Administered 2018-01-18: 8 mL via INTRAVENOUS

## 2018-02-08 DIAGNOSIS — R42 Dizziness and giddiness: Secondary | ICD-10-CM | POA: Insufficient documentation

## 2018-02-08 HISTORY — PX: CARPAL TUNNEL RELEASE: SHX101

## 2018-03-22 HISTORY — PX: CARPAL TUNNEL RELEASE: SHX101

## 2018-05-19 ENCOUNTER — Other Ambulatory Visit: Payer: Self-pay | Admitting: Family Medicine

## 2018-05-19 DIAGNOSIS — Z1231 Encounter for screening mammogram for malignant neoplasm of breast: Secondary | ICD-10-CM

## 2018-05-25 DIAGNOSIS — E2740 Unspecified adrenocortical insufficiency: Secondary | ICD-10-CM | POA: Insufficient documentation

## 2018-06-01 ENCOUNTER — Other Ambulatory Visit: Payer: Self-pay | Admitting: Orthopedic Surgery

## 2018-06-01 DIAGNOSIS — M7061 Trochanteric bursitis, right hip: Secondary | ICD-10-CM

## 2018-06-08 ENCOUNTER — Ambulatory Visit
Admission: RE | Admit: 2018-06-08 | Discharge: 2018-06-08 | Disposition: A | Discharge status: Home / Self Care | Payer: 59 | Source: Ambulatory Visit | Attending: Family Medicine | Admitting: Family Medicine

## 2018-06-08 DIAGNOSIS — Z1231 Encounter for screening mammogram for malignant neoplasm of breast: Secondary | ICD-10-CM | POA: Diagnosis not present

## 2018-06-19 DIAGNOSIS — G8929 Other chronic pain: Secondary | ICD-10-CM | POA: Insufficient documentation

## 2018-06-19 DIAGNOSIS — M542 Cervicalgia: Secondary | ICD-10-CM

## 2018-06-20 ENCOUNTER — Ambulatory Visit
Admission: RE | Admit: 2018-06-20 | Discharge: 2018-06-20 | Disposition: A | Discharge status: Home / Self Care | Payer: 59 | Source: Ambulatory Visit | Attending: Orthopedic Surgery | Admitting: Orthopedic Surgery

## 2018-06-20 DIAGNOSIS — S73102A Unspecified sprain of left hip, initial encounter: Secondary | ICD-10-CM | POA: Insufficient documentation

## 2018-06-20 DIAGNOSIS — M7062 Trochanteric bursitis, left hip: Secondary | ICD-10-CM | POA: Diagnosis present

## 2018-06-20 DIAGNOSIS — M7061 Trochanteric bursitis, right hip: Secondary | ICD-10-CM | POA: Diagnosis present

## 2018-06-20 DIAGNOSIS — X58XXXA Exposure to other specified factors, initial encounter: Secondary | ICD-10-CM | POA: Insufficient documentation

## 2018-09-13 DIAGNOSIS — F17200 Nicotine dependence, unspecified, uncomplicated: Secondary | ICD-10-CM | POA: Insufficient documentation

## 2018-10-06 ENCOUNTER — Other Ambulatory Visit: Payer: Self-pay

## 2018-10-06 ENCOUNTER — Encounter
Admission: RE | Admit: 2018-10-06 | Discharge: 2018-10-06 | Disposition: A | Discharge status: Home / Self Care | Payer: 59 | Source: Ambulatory Visit | Attending: Neurosurgery | Admitting: Neurosurgery

## 2018-10-06 DIAGNOSIS — Z01818 Encounter for other preprocedural examination: Secondary | ICD-10-CM | POA: Diagnosis not present

## 2018-10-06 HISTORY — DX: Unspecified adrenocortical insufficiency: E27.40

## 2018-10-06 LAB — APTT: aPTT: 35 seconds (ref 24–36)

## 2018-10-06 LAB — URINALYSIS, ROUTINE W REFLEX MICROSCOPIC
Bilirubin Urine: NEGATIVE
Glucose, UA: NEGATIVE mg/dL
HGB URINE DIPSTICK: NEGATIVE
Ketones, ur: NEGATIVE mg/dL
Leukocytes, UA: NEGATIVE
Nitrite: NEGATIVE
Protein, ur: NEGATIVE mg/dL
Specific Gravity, Urine: 1.002 — ABNORMAL LOW (ref 1.005–1.030)
pH: 6 (ref 5.0–8.0)

## 2018-10-06 LAB — CBC
HCT: 45.6 % (ref 36.0–46.0)
Hemoglobin: 14.7 g/dL (ref 12.0–15.0)
MCH: 30.3 pg (ref 26.0–34.0)
MCHC: 32.2 g/dL (ref 30.0–36.0)
MCV: 94 fL (ref 80.0–100.0)
NRBC: 0 % (ref 0.0–0.2)
PLATELETS: 291 10*3/uL (ref 150–400)
RBC: 4.85 MIL/uL (ref 3.87–5.11)
RDW: 12.2 % (ref 11.5–15.5)
WBC: 12.9 10*3/uL — ABNORMAL HIGH (ref 4.0–10.5)

## 2018-10-06 LAB — BASIC METABOLIC PANEL
ANION GAP: 9 (ref 5–15)
BUN: 16 mg/dL (ref 6–20)
CO2: 26 mmol/L (ref 22–32)
Calcium: 9 mg/dL (ref 8.9–10.3)
Chloride: 102 mmol/L (ref 98–111)
Creatinine, Ser: 0.74 mg/dL (ref 0.44–1.00)
Glucose, Bld: 75 mg/dL (ref 70–99)
POTASSIUM: 4 mmol/L (ref 3.5–5.1)
SODIUM: 137 mmol/L (ref 135–145)

## 2018-10-06 LAB — TYPE AND SCREEN
ABO/RH(D): O POS
Antibody Screen: NEGATIVE

## 2018-10-06 LAB — SURGICAL PCR SCREEN
MRSA, PCR: NEGATIVE
Staphylococcus aureus: NEGATIVE

## 2018-10-06 LAB — PROTIME-INR
INR: 0.96
Prothrombin Time: 12.7 seconds (ref 11.4–15.2)

## 2018-10-06 NOTE — Pre-Procedure Instructions (Signed)
History of adrenal insufficiency. Takes hydrocortisone 5mg  po daily at home. Per endocrinologist recommendations, Dr. Andree Elk ordered Solu-Cortef 100mg  IV ONCE to be given in pre-op. Then, 50mg  IV q6h for post-op day 0 through the evening. On post-op day 1, the patient will take 3x her usual home dose for 3 days. 15mg  po daily ordered beginning post-op day 1.

## 2018-10-06 NOTE — Patient Instructions (Signed)
Your procedure is scheduled on: Wednesday 10/18/18.  Report to DAY SURGERY DEPARTMENT LOCATED ON 2ND FLOOR MEDICAL MALL ENTRANCE. To find out your arrival time please call [phone] between 1PM - 3PM on Tuesday 10/17/18.   Remember: Instructions that are not followed completely may result in serious medical risk, up to and including death, or upon the discretion of your surgeon and anesthesiologist your surgery may need to be rescheduled.      _X__ 1. Do not eat food after midnight the night before your procedure.                 No gum chewing or hard candies. You may drink clear liquids up to 2 hours                 before you are scheduled to arrive for your surgery- DO NOT drink clear                 liquids within 2 hours of the start of your surgery.                 Clear Liquids include:  water, apple juice without pulp, clear carbohydrate                 drink such as Clearfast or Gatorade, Black Coffee or Tea (Do not add                 creamer to coffee or tea).  __X__2.  On the morning of surgery brush your teeth with toothpaste and water, you may rinse your mouth with mouthwash if you wish.  Do not swallow any toothpaste or mouthwash.     _X__ 3.  No Alcohol for 24 hours before or after surgery.   _X__ 4.  Do Not Smoke or use e-cigarettes For 24 Hours Prior to Your Surgery.                 Do not use any chewable tobacco products for at least 6 hours prior to                 surgery.  __X__5.  Notify your doctor if there is any change in your medical condition      (cold, fever, infections).     Do not wear jewelry, make-up, hairpins, clips or nail polish. Do not wear lotions, powders, or perfumes.  Do not wear deodorant. Do not shave 48 hours prior to surgery. Men may shave face and neck. Do not bring valuables to the hospital.    [facility] is not responsible for any belongings or valuables.  Contacts, dentures/partials or body piercings may not be worn into  surgery. Bring a case for your contacts, glasses or hearing aids, a denture cup will be supplied.   Leave your suitcase in the car. After surgery it may be brought to your room.   For patients admitted to the hospital, discharge time is determined by your treatment team.    Please read over the following fact sheets that you were given:   MRSA Information   __X__ Take these medicines the morning of surgery with A SIP OF WATER:     1. acetaminophen (TYLENOL) 500 MG tablet  2. atorvastatin (LIPITOR) 20 MG tablet  3. DULoxetine (CYMBALTA) 60 MG capsule  4. metoprolol succinate (TOPROL-XL) 50 MG 24 hr tablet  5. pregabalin (LYRICA) 75 MG capsule  6. oxyCODONE-acetaminophen (PERCOCET) 5-325 MG per tablet IF NEEDED  7. traMADol (  ULTRAM) 50 MG tablet IF NEEDED     __X__ Use CHG Soap as directed   __X__ Stop Anti-inflammatories 7 days before surgery such as Advil, Ibuprofen, Motrin, BC or Goodies Powder, Naprosyn, Naproxen, Aleve, Aspirin, Meloxicam, celecoxib (CELEBREX) 200 MG capsule. Last dose will be on Wednesday 10/11/18. May take Tylenol if needed for pain or discomfort.     __X__ Do not start any new herbal supplements or over the counter medications before your surgery.     __X__Check to see if you have a prescription left of oxyCODONE-acetaminophen (PERCOCET) 5-325 MG per tablet. You may need to take this for severe pain relief before surgery because you will be stopping your Celebrex on 10/11/18.

## 2018-10-18 ENCOUNTER — Observation Stay
Admission: RE | Admit: 2018-10-18 | Discharge: 2018-10-20 | Disposition: A | Discharge status: Home / Self Care | Payer: 59 | Attending: Neurosurgery | Admitting: Neurosurgery

## 2018-10-18 ENCOUNTER — Other Ambulatory Visit: Payer: Self-pay

## 2018-10-18 ENCOUNTER — Encounter: Payer: Self-pay | Admitting: *Deleted

## 2018-10-18 ENCOUNTER — Encounter
Admission: RE | Disposition: A | Discharge status: Home / Self Care | Payer: Self-pay | Source: Home / Self Care | Attending: Neurosurgery

## 2018-10-18 ENCOUNTER — Ambulatory Visit: Payer: 59

## 2018-10-18 ENCOUNTER — Ambulatory Visit: Payer: 59 | Admitting: Anesthesiology

## 2018-10-18 DIAGNOSIS — Z419 Encounter for procedure for purposes other than remedying health state, unspecified: Secondary | ICD-10-CM

## 2018-10-18 DIAGNOSIS — M4312 Spondylolisthesis, cervical region: Secondary | ICD-10-CM | POA: Insufficient documentation

## 2018-10-18 DIAGNOSIS — M5412 Radiculopathy, cervical region: Secondary | ICD-10-CM | POA: Diagnosis present

## 2018-10-18 DIAGNOSIS — F17200 Nicotine dependence, unspecified, uncomplicated: Secondary | ICD-10-CM | POA: Insufficient documentation

## 2018-10-18 DIAGNOSIS — Z981 Arthrodesis status: Secondary | ICD-10-CM

## 2018-10-18 DIAGNOSIS — M2578 Osteophyte, vertebrae: Secondary | ICD-10-CM | POA: Insufficient documentation

## 2018-10-18 DIAGNOSIS — E2740 Unspecified adrenocortical insufficiency: Secondary | ICD-10-CM | POA: Insufficient documentation

## 2018-10-18 DIAGNOSIS — M542 Cervicalgia: Principal | ICD-10-CM | POA: Insufficient documentation

## 2018-10-18 DIAGNOSIS — I10 Essential (primary) hypertension: Secondary | ICD-10-CM | POA: Insufficient documentation

## 2018-10-18 HISTORY — PX: ANTERIOR CERVICAL DECOMPRESSION/DISCECTOMY FUSION 4 LEVELS: SHX5556

## 2018-10-18 LAB — GLUCOSE, CAPILLARY: Glucose-Capillary: 185 mg/dL — ABNORMAL HIGH (ref 70–99)

## 2018-10-18 LAB — ABO/RH: ABO/RH(D): O POS

## 2018-10-18 SURGERY — ANTERIOR CERVICAL DECOMPRESSION/DISCECTOMY FUSION 4 LEVELS
Anesthesia: General | Site: Neck

## 2018-10-18 MED ORDER — KETAMINE HCL 50 MG/ML IJ SOLN
INTRAMUSCULAR | Status: AC
  Filled 2018-10-18: qty 10

## 2018-10-18 MED ORDER — LIDOCAINE HCL (PF) 2 % IJ SOLN
INTRAMUSCULAR | Status: AC
  Filled 2018-10-18: qty 10

## 2018-10-18 MED ORDER — SODIUM CHLORIDE FLUSH 0.9 % IV SOLN
INTRAVENOUS | Status: AC
  Filled 2018-10-18: qty 10

## 2018-10-18 MED ORDER — FLUTICASONE PROPIONATE 50 MCG/ACT NA SUSP
2.0000 | Freq: Two times a day (BID) | NASAL | Status: DC
  Administered 2018-10-18 – 2018-10-19 (×3): 2 via NASAL
  Filled 2018-10-18: qty 16

## 2018-10-18 MED ORDER — METHOCARBAMOL 1000 MG/10ML IJ SOLN
500.0000 mg | Freq: Four times a day (QID) | INTRAVENOUS | Status: DC | PRN
  Administered 2018-10-18: 500 mg via INTRAVENOUS
  Filled 2018-10-18 (×3): qty 5

## 2018-10-18 MED ORDER — CEFAZOLIN SODIUM-DEXTROSE 2-4 GM/100ML-% IV SOLN
INTRAVENOUS | Status: AC
  Filled 2018-10-18: qty 100

## 2018-10-18 MED ORDER — DULOXETINE HCL 60 MG PO CPEP
60.0000 mg | ORAL_CAPSULE | Freq: Two times a day (BID) | ORAL | Status: DC
  Administered 2018-10-18 – 2018-10-20 (×4): 60 mg via ORAL
  Filled 2018-10-18 (×4): qty 1

## 2018-10-18 MED ORDER — DEXAMETHASONE SODIUM PHOSPHATE 10 MG/ML IJ SOLN
INTRAMUSCULAR | Status: AC
  Filled 2018-10-18: qty 1

## 2018-10-18 MED ORDER — OXYCODONE HCL 5 MG PO TABS
10.0000 mg | ORAL_TABLET | ORAL | Status: DC | PRN
  Administered 2018-10-18 – 2018-10-20 (×8): 10 mg via ORAL
  Filled 2018-10-18 (×8): qty 2

## 2018-10-18 MED ORDER — ROCURONIUM BROMIDE 50 MG/5ML IV SOLN
INTRAVENOUS | Status: AC
  Filled 2018-10-18: qty 1

## 2018-10-18 MED ORDER — SENNOSIDES-DOCUSATE SODIUM 8.6-50 MG PO TABS: 1.0000 | ORAL_TABLET | Freq: Every evening | ORAL | Status: DC | PRN

## 2018-10-18 MED ORDER — BUPIVACAINE-EPINEPHRINE (PF) 0.5% -1:200000 IJ SOLN
INTRAMUSCULAR | Status: AC
  Filled 2018-10-18: qty 90

## 2018-10-18 MED ORDER — IRBESARTAN 150 MG PO TABS
150.0000 mg | ORAL_TABLET | Freq: Every day | ORAL | Status: DC
  Administered 2018-10-19 – 2018-10-20 (×2): 150 mg via ORAL
  Filled 2018-10-18 (×2): qty 1

## 2018-10-18 MED ORDER — LACTATED RINGERS IV SOLN
INTRAVENOUS | Status: DC
  Administered 2018-10-18: 12:00:00 via INTRAVENOUS

## 2018-10-18 MED ORDER — SCOPOLAMINE 1 MG/3DAYS TD PT72
1.0000 | MEDICATED_PATCH | TRANSDERMAL | Status: DC
  Administered 2018-10-18: 1.5 mg via TRANSDERMAL

## 2018-10-18 MED ORDER — PROPOFOL 500 MG/50ML IV EMUL
INTRAVENOUS | Status: AC
  Filled 2018-10-18: qty 50

## 2018-10-18 MED ORDER — PROPOFOL 10 MG/ML IV BOLUS
INTRAVENOUS | Status: DC | PRN
  Administered 2018-10-18: 150 mg via INTRAVENOUS

## 2018-10-18 MED ORDER — FAMOTIDINE 20 MG PO TABS
20.0000 mg | ORAL_TABLET | Freq: Once | ORAL | Status: AC
  Administered 2018-10-18: 20 mg via ORAL

## 2018-10-18 MED ORDER — ATORVASTATIN CALCIUM 20 MG PO TABS
20.0000 mg | ORAL_TABLET | Freq: Every day | ORAL | Status: DC
  Administered 2018-10-19 – 2018-10-20 (×2): 20 mg via ORAL
  Filled 2018-10-18 (×2): qty 1

## 2018-10-18 MED ORDER — ACETAMINOPHEN 650 MG RE SUPP
650.0000 mg | RECTAL | Status: DC | PRN
  Filled 2018-10-18: qty 1

## 2018-10-18 MED ORDER — METHOCARBAMOL 500 MG PO TABS
500.0000 mg | ORAL_TABLET | Freq: Four times a day (QID) | ORAL | Status: DC | PRN
  Filled 2018-10-18: qty 1

## 2018-10-18 MED ORDER — HYDROCORTISONE NA SUCCINATE PF 100 MG IJ SOLR
100.0000 mg | Freq: Once | INTRAMUSCULAR | Status: AC
  Administered 2018-10-18: 100 mg via INTRAVENOUS

## 2018-10-18 MED ORDER — PHENYLEPHRINE HCL 10 MG/ML IJ SOLN
INTRAMUSCULAR | Status: DC | PRN
  Administered 2018-10-18 (×6): 100 ug via INTRAVENOUS

## 2018-10-18 MED ORDER — SUGAMMADEX SODIUM 200 MG/2ML IV SOLN
INTRAVENOUS | Status: AC
  Filled 2018-10-18: qty 2

## 2018-10-18 MED ORDER — LIDOCAINE HCL (CARDIAC) PF 100 MG/5ML IV SOSY
PREFILLED_SYRINGE | INTRAVENOUS | Status: DC | PRN
  Administered 2018-10-18: 100 mg via INTRAVENOUS

## 2018-10-18 MED ORDER — AMLODIPINE-OLMESARTAN 5-20 MG PO TABS: 1.0000 | ORAL_TABLET | Freq: Every day | ORAL | Status: DC

## 2018-10-18 MED ORDER — FENTANYL CITRATE (PF) 250 MCG/5ML IJ SOLN
INTRAMUSCULAR | Status: AC
  Filled 2018-10-18: qty 5

## 2018-10-18 MED ORDER — HYDROMORPHONE HCL 1 MG/ML IJ SOLN
INTRAMUSCULAR | Status: AC
  Filled 2018-10-18: qty 1

## 2018-10-18 MED ORDER — HYDROCORTISONE 5 MG PO TABS
5.0000 mg | ORAL_TABLET | Freq: Every day | ORAL | Status: DC
  Administered 2018-10-18: 5 mg via ORAL
  Filled 2018-10-18: qty 1

## 2018-10-18 MED ORDER — CEFAZOLIN SODIUM-DEXTROSE 2-4 GM/100ML-% IV SOLN
2.0000 g | Freq: Once | INTRAVENOUS | Status: AC
  Administered 2018-10-18: 2 g via INTRAVENOUS

## 2018-10-18 MED ORDER — SODIUM CHLORIDE 0.9% FLUSH
3.0000 mL | Freq: Two times a day (BID) | INTRAVENOUS | Status: DC
  Administered 2018-10-19: 3 mL via INTRAVENOUS

## 2018-10-18 MED ORDER — HYDROCORTISONE NA SUCCINATE PF 100 MG IJ SOLR
INTRAMUSCULAR | Status: AC
  Administered 2018-10-18: 100 mg via INTRAVENOUS
  Filled 2018-10-18: qty 2

## 2018-10-18 MED ORDER — FENTANYL CITRATE (PF) 100 MCG/2ML IJ SOLN
INTRAMUSCULAR | Status: DC | PRN
  Administered 2018-10-18: 25 ug via INTRAVENOUS
  Administered 2018-10-18: 100 ug via INTRAVENOUS

## 2018-10-18 MED ORDER — THROMBIN 5000 UNITS EX SOLR
CUTANEOUS | Status: DC | PRN
  Administered 2018-10-18: 5000 [IU] via TOPICAL

## 2018-10-18 MED ORDER — IRBESARTAN 150 MG PO TABS: 150.0000 mg | ORAL_TABLET | Freq: Every day | ORAL | Status: DC

## 2018-10-18 MED ORDER — PREGABALIN 75 MG PO CAPS
75.0000 mg | ORAL_CAPSULE | Freq: Three times a day (TID) | ORAL | Status: DC
  Administered 2018-10-18 – 2018-10-20 (×5): 75 mg via ORAL
  Filled 2018-10-18 (×5): qty 1

## 2018-10-18 MED ORDER — SCOPOLAMINE 1 MG/3DAYS TD PT72
MEDICATED_PATCH | TRANSDERMAL | Status: AC
  Filled 2018-10-18: qty 1

## 2018-10-18 MED ORDER — ROCURONIUM BROMIDE 100 MG/10ML IV SOLN
INTRAVENOUS | Status: DC | PRN
  Administered 2018-10-18: 40 mg via INTRAVENOUS

## 2018-10-18 MED ORDER — ALPRAZOLAM 0.5 MG PO TABS: 0.5000 mg | ORAL_TABLET | Freq: Two times a day (BID) | ORAL | Status: DC | PRN

## 2018-10-18 MED ORDER — OXYMETAZOLINE HCL 0.05 % NA SOLN
NASAL | Status: AC
  Filled 2018-10-18: qty 15

## 2018-10-18 MED ORDER — SODIUM CHLORIDE 0.9 % IR SOLN
Status: DC | PRN
  Administered 2018-10-18: 1000 mL

## 2018-10-18 MED ORDER — MENTHOL 3 MG MT LOZG
1.0000 | LOZENGE | OROMUCOSAL | Status: DC | PRN
  Filled 2018-10-18: qty 9

## 2018-10-18 MED ORDER — KETAMINE HCL 10 MG/ML IJ SOLN
INTRAMUSCULAR | Status: DC | PRN
  Administered 2018-10-18: 40 mg via INTRAVENOUS

## 2018-10-18 MED ORDER — ONDANSETRON HCL 4 MG/2ML IJ SOLN
INTRAMUSCULAR | Status: AC
  Filled 2018-10-18: qty 2

## 2018-10-18 MED ORDER — BUPIVACAINE-EPINEPHRINE (PF) 0.5% -1:200000 IJ SOLN
INTRAMUSCULAR | Status: DC | PRN
  Administered 2018-10-18: 5 mL

## 2018-10-18 MED ORDER — HYDROCORTISONE 10 MG PO TABS
10.0000 mg | ORAL_TABLET | Freq: Every day | ORAL | Status: DC
  Administered 2018-10-19 – 2018-10-20 (×2): 10 mg via ORAL
  Filled 2018-10-18 (×2): qty 1

## 2018-10-18 MED ORDER — HYDROCORTISONE 5 MG PO TABS
5.0000 mg | ORAL_TABLET | Freq: Every day | ORAL | Status: DC
  Filled 2018-10-18: qty 1

## 2018-10-18 MED ORDER — HYDROMORPHONE HCL 1 MG/ML IJ SOLN
0.2500 mg | INTRAMUSCULAR | Status: AC | PRN
  Administered 2018-10-18 (×8): 0.25 mg via INTRAVENOUS

## 2018-10-18 MED ORDER — ONDANSETRON HCL 4 MG PO TABS: 4.0000 mg | ORAL_TABLET | Freq: Four times a day (QID) | ORAL | Status: DC | PRN

## 2018-10-18 MED ORDER — DEXAMETHASONE SODIUM PHOSPHATE 10 MG/ML IJ SOLN
INTRAMUSCULAR | Status: DC | PRN
  Administered 2018-10-18: 8 mg via INTRAVENOUS

## 2018-10-18 MED ORDER — KETAMINE HCL 10 MG/ML IJ SOLN: INTRAMUSCULAR | Status: DC | PRN

## 2018-10-18 MED ORDER — PHENOL 1.4 % MT LIQD
1.0000 | OROMUCOSAL | Status: DC | PRN
  Filled 2018-10-18: qty 177

## 2018-10-18 MED ORDER — REMIFENTANIL HCL 1 MG IV SOLR
INTRAVENOUS | Status: AC
  Filled 2018-10-18: qty 1000

## 2018-10-18 MED ORDER — FLUTICASONE FUROATE-VILANTEROL 100-25 MCG/INH IN AEPB
1.0000 | INHALATION_SPRAY | Freq: Every day | RESPIRATORY_TRACT | Status: DC
  Administered 2018-10-18 – 2018-10-20 (×3): 1 via RESPIRATORY_TRACT
  Filled 2018-10-18: qty 28

## 2018-10-18 MED ORDER — PROMETHAZINE HCL 25 MG/ML IJ SOLN: 6.2500 mg | INTRAMUSCULAR | Status: DC | PRN

## 2018-10-18 MED ORDER — ONDANSETRON HCL 4 MG/2ML IJ SOLN
INTRAMUSCULAR | Status: DC | PRN
  Administered 2018-10-18: 4 mg via INTRAVENOUS

## 2018-10-18 MED ORDER — BACITRACIN 50000 UNITS IM SOLR
INTRAMUSCULAR | Status: AC
  Filled 2018-10-18: qty 1

## 2018-10-18 MED ORDER — MIDAZOLAM HCL 2 MG/2ML IJ SOLN
INTRAMUSCULAR | Status: AC
  Filled 2018-10-18: qty 2

## 2018-10-18 MED ORDER — AMLODIPINE BESYLATE 5 MG PO TABS
5.0000 mg | ORAL_TABLET | Freq: Every day | ORAL | Status: DC
  Administered 2018-10-19 – 2018-10-20 (×2): 5 mg via ORAL
  Filled 2018-10-18 (×2): qty 1

## 2018-10-18 MED ORDER — ONDANSETRON HCL 4 MG/2ML IJ SOLN: 4.0000 mg | Freq: Four times a day (QID) | INTRAMUSCULAR | Status: DC | PRN

## 2018-10-18 MED ORDER — HYDROCORTISONE NA SUCCINATE PF 100 MG IJ SOLR
50.0000 mg | Freq: Four times a day (QID) | INTRAMUSCULAR | Status: AC
  Administered 2018-10-18 – 2018-10-19 (×3): 50 mg via INTRAVENOUS
  Filled 2018-10-18 (×3): qty 1

## 2018-10-18 MED ORDER — HYDROCORTISONE 5 MG PO TABS: 10.0000 mg | ORAL_TABLET | Freq: Every day | ORAL | Status: DC

## 2018-10-18 MED ORDER — ACETAMINOPHEN 500 MG PO TABS
1000.0000 mg | ORAL_TABLET | Freq: Four times a day (QID) | ORAL | Status: AC
  Administered 2018-10-18 – 2018-10-19 (×4): 1000 mg via ORAL
  Filled 2018-10-18 (×4): qty 2

## 2018-10-18 MED ORDER — THROMBIN 5000 UNITS EX SOLR
CUTANEOUS | Status: AC
  Filled 2018-10-18: qty 10000

## 2018-10-18 MED ORDER — ACETAMINOPHEN 325 MG PO TABS: 650.0000 mg | ORAL_TABLET | ORAL | Status: DC | PRN

## 2018-10-18 MED ORDER — HYDROCORTISONE 10 MG PO TABS
15.0000 mg | ORAL_TABLET | Freq: Every day | ORAL | Status: DC
  Filled 2018-10-18: qty 1.5

## 2018-10-18 MED ORDER — SODIUM CHLORIDE 0.9 % IV SOLN: 250.0000 mL | INTRAVENOUS | Status: DC

## 2018-10-18 MED ORDER — SODIUM CHLORIDE 0.9 % IV SOLN
INTRAVENOUS | Status: DC
  Administered 2018-10-18 – 2018-10-19 (×2): via INTRAVENOUS

## 2018-10-18 MED ORDER — SODIUM CHLORIDE 0.9% FLUSH: 3.0000 mL | INTRAVENOUS | Status: DC | PRN

## 2018-10-18 MED ORDER — METOPROLOL SUCCINATE ER 25 MG PO TB24
50.0000 mg | ORAL_TABLET | Freq: Every day | ORAL | Status: DC
  Administered 2018-10-19 – 2018-10-20 (×2): 50 mg via ORAL
  Filled 2018-10-18 (×2): qty 2

## 2018-10-18 MED ORDER — PROPOFOL 500 MG/50ML IV EMUL
INTRAVENOUS | Status: DC | PRN
  Administered 2018-10-18: 50 ug/kg/min via INTRAVENOUS

## 2018-10-18 MED ORDER — PROPOFOL 10 MG/ML IV BOLUS
INTRAVENOUS | Status: AC
  Filled 2018-10-18: qty 20

## 2018-10-18 MED ORDER — AMLODIPINE BESYLATE 5 MG PO TABS: 5.0000 mg | ORAL_TABLET | Freq: Every day | ORAL | Status: DC

## 2018-10-18 MED ORDER — HYDROMORPHONE HCL 1 MG/ML IJ SOLN
0.5000 mg | INTRAMUSCULAR | Status: DC | PRN
  Administered 2018-10-18 – 2018-10-19 (×3): 0.5 mg via INTRAVENOUS
  Filled 2018-10-18 (×3): qty 1

## 2018-10-18 MED ORDER — FAMOTIDINE 20 MG PO TABS
ORAL_TABLET | ORAL | Status: AC
  Filled 2018-10-18: qty 1

## 2018-10-18 MED ORDER — OXYCODONE HCL 5 MG PO TABS: 5.0000 mg | ORAL_TABLET | ORAL | Status: DC | PRN

## 2018-10-18 MED ORDER — MIDAZOLAM HCL 2 MG/2ML IJ SOLN
INTRAMUSCULAR | Status: DC | PRN
  Administered 2018-10-18: 2 mg via INTRAVENOUS

## 2018-10-18 MED ORDER — KETAMINE INJECTION FOR OPTIME (MG/KG/HR) DOCUMENTATION
INTRAVENOUS | Status: DC | PRN
  Administered 2018-10-18: .25 mg/kg/h via INTRAVENOUS

## 2018-10-18 MED ORDER — AMITRIPTYLINE HCL 100 MG PO TABS
100.0000 mg | ORAL_TABLET | Freq: Every day | ORAL | Status: DC
  Administered 2018-10-18 – 2018-10-19 (×2): 100 mg via ORAL
  Filled 2018-10-18 (×2): qty 1
  Filled 2018-10-18 (×2): qty 4

## 2018-10-18 SURGICAL SUPPLY — 61 items
BONE CERV LORDOTIC 14.5X12X7 (Bone Implant) ×6 IMPLANT
BULB RESERV EVAC DRAIN JP 100C (MISCELLANEOUS) IMPLANT
BUR NEURO DRILL SOFT 3.0X3.8M (BURR) ×2 IMPLANT
CANISTER SUCT 1200ML W/VALVE (MISCELLANEOUS) ×4 IMPLANT
CHLORAPREP W/TINT 26ML (MISCELLANEOUS) ×4 IMPLANT
COUNTER NEEDLE 20/40 LG (NEEDLE) ×2 IMPLANT
COVER LIGHT HANDLE STERIS (MISCELLANEOUS) ×4 IMPLANT
COVER WAND RF STERILE (DRAPES) ×2 IMPLANT
CRADLE LAMINECT ARM (MISCELLANEOUS) ×2 IMPLANT
DERMABOND ADVANCED (GAUZE/BANDAGES/DRESSINGS) ×1
DERMABOND ADVANCED .7 DNX12 (GAUZE/BANDAGES/DRESSINGS) ×1 IMPLANT
DRAIN CHANNEL JP 10F RND 20C F (MISCELLANEOUS) IMPLANT
DRAPE C-ARM 42X72 X-RAY (DRAPES) ×4 IMPLANT
DRAPE LAPAROTOMY 77X122 PED (DRAPES) ×2 IMPLANT
DRAPE MICROSCOPE SPINE 48X150 (DRAPES) ×2 IMPLANT
DRAPE POUCH INSTRU U-SHP 10X18 (DRAPES) ×2 IMPLANT
DRAPE SURG 17X11 SM STRL (DRAPES) ×8 IMPLANT
ELECT CAUTERY BLADE TIP 2.5 (TIP) ×2
ELECT REM PT RETURN 9FT ADLT (ELECTROSURGICAL) ×2
ELECTRODE CAUTERY BLDE TIP 2.5 (TIP) ×1 IMPLANT
ELECTRODE REM PT RTRN 9FT ADLT (ELECTROSURGICAL) ×1 IMPLANT
FEE INTRAOP MONITOR IMPULS NCS (MISCELLANEOUS) IMPLANT
FRAME EYE SHIELD (PROTECTIVE WEAR) ×4 IMPLANT
GLOVE BIOGEL PI IND STRL 7.0 (GLOVE) ×1 IMPLANT
GLOVE BIOGEL PI INDICATOR 7.0 (GLOVE) ×1
GLOVE SURG SYN 7.0 (GLOVE) ×4 IMPLANT
GLOVE SURG SYN 7.0 PF PI (GLOVE) ×2 IMPLANT
GLOVE SURG SYN 8.5  E (GLOVE) ×3
GLOVE SURG SYN 8.5 E (GLOVE) ×3 IMPLANT
GLOVE SURG SYN 8.5 PF PI (GLOVE) ×3 IMPLANT
GOWN SRG XL LVL 3 NONREINFORCE (GOWNS) ×1 IMPLANT
GOWN STRL NON-REIN TWL XL LVL3 (GOWNS) ×1
GOWN STRL REUS W/TWL MED LVL3 (GOWN DISPOSABLE) ×2 IMPLANT
GRADUATE 1200CC STRL 31836 (MISCELLANEOUS) ×2 IMPLANT
GRAFT BNE SPCR VG2 14.5X12X7 (Bone Implant) IMPLANT
INTRAOP MONITOR FEE IMPULS NCS (MISCELLANEOUS)
INTRAOP MONITOR FEE IMPULSE (MISCELLANEOUS)
IV CATH ANGIO 12GX3 LT BLUE (NEEDLE) ×2 IMPLANT
KIT TURNOVER KIT A (KITS) ×2 IMPLANT
MARKER SKIN DUAL TIP RULER LAB (MISCELLANEOUS) ×4 IMPLANT
NEEDLE HYPO 22GX1.5 SAFETY (NEEDLE) ×2 IMPLANT
NS IRRIG 1000ML POUR BTL (IV SOLUTION) ×2 IMPLANT
PACK LAMINECTOMY NEURO (CUSTOM PROCEDURE TRAY) ×2 IMPLANT
PIN CASPAR 14 (PIN) ×1 IMPLANT
PIN CASPAR 14MM (PIN) ×2
PLATE SKYLINE THREE LEVEL 51MM (Plate) ×1 IMPLANT
SCREW SELF DRILL SKYLINE 12MM (Screw) ×4 IMPLANT
SCREW SKYLINE 14MM SD-VA (Screw) ×4 IMPLANT
SCREW SKYLINE VAR OS 14MM (Screw) ×1 IMPLANT
SPOGE SURGIFLO 8M (HEMOSTASIS) ×2
SPONGE KITTNER 5P (MISCELLANEOUS) ×4 IMPLANT
SPONGE SURGIFLO 8M (HEMOSTASIS) ×2 IMPLANT
SUT SILK 2 0 (SUTURE)
SUT SILK 2-0 18XBRD TIE 12 (SUTURE) IMPLANT
SUT V-LOC 90 ABS DVC 3-0 CL (SUTURE) ×2 IMPLANT
SUT VIC AB 3-0 SH 8-18 (SUTURE) ×2 IMPLANT
SYR 30ML LL (SYRINGE) ×2 IMPLANT
TAPE CLOTH 3X10 WHT NS LF (GAUZE/BANDAGES/DRESSINGS) ×2 IMPLANT
TOWEL OR 17X26 4PK STRL BLUE (TOWEL DISPOSABLE) ×8 IMPLANT
TRAY FOLEY MTR SLVR 16FR STAT (SET/KITS/TRAYS/PACK) IMPLANT
TUBING CONNECTING 10 (TUBING) ×2 IMPLANT

## 2018-10-18 NOTE — H&P (Signed)
I have reviewed and confirmed my history and physical from 09/21/18 with no additions or changes. Plan for C2-5 ACDF.  Risks and benefits reviewed.  Heart sounds normal no MRG. Chest Clear to Auscultation Bilaterally.

## 2018-10-18 NOTE — Anesthesia Post-op Follow-up Note (Signed)
Anesthesia QCDR form completed.        

## 2018-10-18 NOTE — Op Note (Signed)
Indications: Mrs. Couzens is a 53 yo female who presented with cervicalgia and anterolisthesis.  She failed conservative management and elected for surgical intervention.    Findings: correction of anterolisthesis  Preoperative Diagnosis: Cervicalgia and anterolisthesis Postoperative Diagnosis: same   EBL: 50 ml IVF: 1600 ml Drains: none Disposition: Extubated and Stable to PACU Complications: none  No foley catheter was placed.   Preoperative Note:   Risks of surgery discussed include: infection, bleeding, stroke, coma, death, paralysis, CSF leak, nerve/spinal cord injury, numbness, tingling, weakness, complex regional pain syndrome, recurrent stenosis and/or disc herniation, vascular injury, development of instability, neck/back pain, need for further surgery, persistent symptoms, development of deformity, and the risks of anesthesia. The patient understood these risks and agreed to proceed.  Operative Procedure: 1. Anterior Cervical Discectomy and Fusion C2-3 including bilateral foraminotomies and end plate preparation  2. Anterior Cervical Discectomy and Fusion C3-4 including bilateral foraminotomies and end plate preparation  3. Anterior Cervical Discectomy and Fusion C4-5 including bilateral foraminotomies and end plate preparation 4. Anterior Spinal Instrumentation C2 to 5  5. Anterior arthrodesis from C2 to C5 6. Use of the operative microscope 7. Use of intraoperative flouroscopy  PROCEDURE IN DETAIL: After obtaining informed consent, the patient taken to the operating room, placed in supine position, general anesthesia induced.  The patient had a small shoulder roll placed behind their shoulders.  The patient received preop antibiotics and IV Decadron.  The patient had a neck incision outlined, was prepped and draped in usual sterile fashion. The incision was injected with local anesthetic.   An incision was opened, dissection taken down medial to the carotid artery and  jugular vein, lateral to the trachea and esophagus.  The prevertebral fascia identified and a localizing x-ray demonstrated the correct level.  The longus colli were dissected laterally, and self-retaining retractors placed to open the operative field. The microscope was then brought into the field.  With this complete, distractor pins were placed in the vertebral bodies of C2 and C3. The distractor was placed from C2-3, and the annulus at C2-3 was opened using a bovie.  Curettes and pituitary rongeurs used to remove the majority of disk, then the drill was used to remove the posterior osteophyte and begin the foraminotomies. The nerve hook was used to elevate the posterior longitudinal ligament, which was then removed with Kerrison rongeurs. The microblunt nerve hook could be passed out the foramen bilaterally.   Meticulous hemostasis wasobtained.  Structural allograft coated with demineralized bone matrix was tapped behind the anterior lip of the vertebral body at C2-3 (7 mm).  The distractor was then removed, and the C2 caspar pin removed. Bone wax was used for hemostasis. An additional Caspar pin was placed at C5. The distractor was placed from C4 to C6. The annuli at C3-4 and C4-5 were opened with a bovie. Curettes and pituitary rongeurs used to remove the majority of disk at each level, then the drill was used to remove the posterior osteophyte and begin the foraminotomies. The nerve hook was used to elevate the posterior longitudinal ligament, which was then removed with Kerrison rongeurs. The microblunt nerve hook could be passed out the foramen bilaterally at each level.   Meticulous hemostasis was obtained. After hemostasis, structural allograft was tapped behind the anterior lip of the vertebral body at C3-4 (7 mm) and C4-5 (7 mm).    The caspar distractor was removed, and bone wax used for hemostasis at each level. The anterior osteophytes were removed.   A  separate, four segment, three level  plate (51 mm Depuy Skyline) was chosen.  Two screws placed in the vertebral bodies of all four segments, respectively making sure the screws were behind the locking mechanism.  Final AP and lateral radiographs were taken.   With everything in good position, the wound was irrigated copiously with bacitracin-containing solution and meticulous hemostasis obtained.  Wound was closed in 2 layers using interrupted inverted 3-0 Vicryl sutures in the platysma and 3-0 monocryl in the dermis.  The wound was dressed with dermabond, the head of bed at 30 degrees, taken to recovery room in stable condition.  No new postop neurological deficits were identified.  All counts were correct at the end of the case. Monitoring was used throughout without any changes.  Marin Olp PA assisted in the entire procedure.  Meade Maw MD

## 2018-10-18 NOTE — Progress Notes (Signed)
Pt admitted to unit from PACU. Pt is A&Ox4, VSS. Surgical incision CDI with skin glue. Pt has full sensation in all extremities, R side is weaker than L, but per pt this is baseline for her. Pt oriented to room and plan of care. Pt did ambulate to bathroom with assistance with no issues.

## 2018-10-18 NOTE — Transfer of Care (Signed)
Immediate Anesthesia Transfer of Care Note  Patient: Abigail [name]  Procedure(s) Performed: ANTERIOR CERVICAL DECOMPRESSION/DISCECTOMY FUSION C 2-5 (N/A Neck)  Patient Location: PACU  Anesthesia Type:General  Level of Consciousness: awake and alert   Airway & Oxygen Therapy: Patient Spontanous Breathing and Patient connected to face mask oxygen  Post-op Assessment: Report given to RN and Post -op Vital signs reviewed and stable  Post vital signs: Reviewed and stable  Last Vitals:  Vitals Value Taken Time  BP 108/69 10/18/2018  2:50 PM  Temp    Pulse 73 10/18/2018  2:52 PM  Resp 14 10/18/2018  2:52 PM  SpO2 100 % 10/18/2018  2:52 PM  Vitals shown include unvalidated device data.  Last Pain:  Vitals:   10/18/18 0901  TempSrc: Oral  PainSc: 8       Patients Stated Pain Goal: 3 (16/01/09 3235)  Complications: No apparent anesthesia complications

## 2018-10-18 NOTE — Anesthesia Preprocedure Evaluation (Addendum)
Anesthesia Evaluation  Patient identified by MRN, date of birth, ID band Patient awake    Reviewed: Allergy & Precautions, H&P , NPO status , Patient's Chart, lab work & pertinent test results  History of Anesthesia Complications (+) history of anesthetic complications ("headache when comes out of anesthesia")  Airway Mallampati: III       Dental  (+) Poor Dentition   Pulmonary neg pulmonary ROS, Current Smoker,           Cardiovascular hypertension, negative cardio ROS       Neuro/Psych  Headaches, Cervical anterolisthesis, cervicalgia (neck and shoulder pain) Lumbar radiculopathy TIAnegative neurological ROS  negative psych ROS   GI/Hepatic negative GI ROS, Neg liver ROS,   Endo/Other  Hypothyroidism Adrenal insufficiency on hydrocortisone therapy, followed by endocrinology.  Received hydrocortisone 100 mg IV in preop per Endocrinology recommendations  Renal/GU      Musculoskeletal  (+) Arthritis ,   Abdominal   Peds  Hematology negative hematology ROS (+)   Anesthesia Other Findings Past Medical History: No date: Adrenal cortex insufficiency (HCC) No date: Arthritis     Comment:  arthritis-back, weakness in right leg No date: Complication of anesthesia     Comment:  headache when come out of anesthesia No date: Headache(784.0)     Comment:  occ. migraines, not frequent No date: Hypertension No date: Syncope  Past Surgical History: 14 yrs ago: ABDOMINAL HYSTERECTOMY No date: BACK SURGERY 04/10/2015: BREAST BIOPSY; Left     Comment:  BENIGN BREAST TISSUE No date: CHOLECYSTECTOMY No date: EYE SURGERY     Comment:  lasix 09/28/2013: HEMI-MICRODISCECTOMY LUMBAR LAMINECTOMY LEVEL 1; N/A     Comment:  Procedure: HEMI-MICRODISCECTOMY LUMBAR LAMINECTOMY L5-S1              LEFT;  Surgeon: Tobi Bastos, MD;  Location: [facility];               Service: Orthopedics;  Laterality: N/A; 10 yrs ago: left foot  bone spur surgery 07/13/2012: LUMBAR LAMINECTOMY/DECOMPRESSION MICRODISCECTOMY     Comment:  Procedure: LUMBAR LAMINECTOMY/DECOMPRESSION               MICRODISCECTOMY 1 LEVEL;  Surgeon: Tobi Bastos, MD;               Location: [facility];  Service: Orthopedics;  Laterality:               N/A;  Central Decompression Lumbar Laminectomy L5 - S1               (X-Ray) 03/07/2013: LUMBAR LAMINECTOMY/DECOMPRESSION MICRODISCECTOMY; Right     Comment:  Procedure: LUMBAR DECOMPRESSIVE HEMI LAMINECTOMY AND                MICRODISCECTOMY  L5-S1 RIGHT ;  Surgeon: Tobi Bastos, MD;  Location: [facility];  Service: Orthopedics;                Laterality: Right;  BMI    Body Mass Index:  28.29 kg/m      Reproductive/Obstetrics negative OB ROS                            Anesthesia Physical Anesthesia Plan  ASA: III  Anesthesia Plan: General ETT   Post-op Pain Management:    Induction:   PONV Risk Score and Plan: Ondansetron, Dexamethasone, Midazolam and  Treatment may vary due to age or medical condition  Airway Management Planned: Nasal ETT and Video Laryngoscope Planned  Additional Equipment:   Intra-op Plan:   Post-operative Plan:   Informed Consent: I have reviewed the patients History and Physical, chart, labs and discussed the procedure including the risks, benefits and alternatives for the proposed anesthesia with the patient or authorized representative who has indicated his/her understanding and acceptance.   Dental Advisory Given  Plan Discussed with: Anesthesiologist, CRNA and Surgeon  Anesthesia Plan Comments:         Anesthesia Quick Evaluation

## 2018-10-18 NOTE — Anesthesia Procedure Notes (Signed)
Procedure Name: Intubation Date/Time: 10/18/2018 12:15 PM Performed by: Rona Ravens, CRNA Pre-anesthesia Checklist: Patient identified Patient Re-evaluated:Patient Re-evaluated prior to induction Oxygen Delivery Method: Circle system utilized Preoxygenation: Pre-oxygenation with 100% oxygen Induction Type: IV induction Ventilation: Mask ventilation without difficulty Laryngoscope Size: McGraph and 3 Grade View: Grade I Nasal Tubes: Left, Nasal prep performed, Nasal Rae and Magill forceps - small, utilized Tube size: 6.5 mm Number of attempts: 1 Placement Confirmation: ETT inserted through vocal cords under direct vision,  positive ETCO2,  breath sounds checked- equal and bilateral and CO2 detector Secured at: 26 cm Tube secured with: Tape

## 2018-10-18 NOTE — Progress Notes (Signed)
Procedure: C2-C5 ACDF Procedure date: 10/18/2018 Diagnosis: Spondylolisthesis  History:  POD0: Status post C2-C5 ACDF for spondylolisthesis and cervicalgia.  Seen operatively in recovery, still disoriented from anesthesia able to answer question obey commands.  Complains of posterior cervical pain.  Denies any upper extremity pain/numbness/tingling/weakness.  Denies any issues swallowing.  Physical Exam: Vitals:   10/18/18 1526 10/18/18 1530  BP:    Pulse: 72 70  Resp: (!) 9 14  Temp:    SpO2: 92% (!) 89%    AA Ox3 Strength: at least 4/5 upper and lower extremities - currently pain limited.  Sensation: Intact and symmetric throughout upper and lower extremities   Data:  No results for input(s): NA, K, CL, CO2, BUN, CREATININE, LABGLOM, GLUCOSE, CALCIUM in the last 168 hours. No results for input(s): AST, ALT, ALKPHOS in the last 168 hours.  Invalid input(s): TBILI   No results for input(s): WBC, HGB, HCT, PLT in the last 168 hours. No results for input(s): APTT, INR in the last 168 hours.       Other tests/results: No imaging reviewed  Assessment/Plan:  Abigail [name] POD status post C2-C5 ACDF for spondylolisthesis and cervicalgia.  She is recovering well with complaints of posterior neck pain that is expected at this time.  No upper extremity complaints.  We will continue monitoring for pain control. Patient has received cervical collar.  - mobilize - pain control - DVT prophylaxis  Abigail Olp PA-C Department of Neurosurgery

## 2018-10-19 ENCOUNTER — Encounter: Payer: Self-pay | Admitting: Neurosurgery

## 2018-10-19 ENCOUNTER — Observation Stay: Payer: 59

## 2018-10-19 DIAGNOSIS — M542 Cervicalgia: Secondary | ICD-10-CM | POA: Diagnosis not present

## 2018-10-19 MED ORDER — METHOCARBAMOL 500 MG PO TABS
750.0000 mg | ORAL_TABLET | Freq: Four times a day (QID) | ORAL | Status: DC | PRN
  Administered 2018-10-19: 750 mg via ORAL
  Filled 2018-10-19: qty 2

## 2018-10-19 MED ORDER — METHOCARBAMOL 500 MG PO TABS
1000.0000 mg | ORAL_TABLET | Freq: Four times a day (QID) | ORAL | Status: DC
  Administered 2018-10-19 – 2018-10-20 (×3): 1000 mg via ORAL
  Filled 2018-10-19 (×3): qty 2

## 2018-10-19 MED ORDER — KETOROLAC TROMETHAMINE 30 MG/ML IJ SOLN
30.0000 mg | Freq: Once | INTRAMUSCULAR | Status: AC
  Administered 2018-10-19: 30 mg via INTRAVENOUS
  Filled 2018-10-19: qty 1

## 2018-10-19 MED ORDER — METHOCARBAMOL 500 MG PO TABS: 1000.0000 mg | ORAL_TABLET | Freq: Four times a day (QID) | ORAL | Status: DC | PRN

## 2018-10-19 MED ORDER — KETOROLAC TROMETHAMINE 15 MG/ML IJ SOLN
15.0000 mg | Freq: Three times a day (TID) | INTRAMUSCULAR | Status: DC
  Administered 2018-10-19 – 2018-10-20 (×2): 15 mg via INTRAVENOUS
  Filled 2018-10-19 (×2): qty 1

## 2018-10-19 MED ORDER — METHOCARBAMOL 1000 MG/10ML IJ SOLN
500.0000 mg | Freq: Four times a day (QID) | INTRAVENOUS | Status: DC | PRN
  Filled 2018-10-19: qty 5

## 2018-10-19 MED ORDER — HYDROCORTISONE 5 MG PO TABS
5.0000 mg | ORAL_TABLET | Freq: Every day | ORAL | Status: DC
  Administered 2018-10-19: 5 mg via ORAL
  Filled 2018-10-19 (×2): qty 1

## 2018-10-19 NOTE — Progress Notes (Addendum)
Procedure: C2-C5 ACDF Procedure date: 10/18/2018 Diagnosis: Spondylolisthesis  History: POD1: Recovering well but complains of 9/10 posterior cervical pain more prominent on the right. Does not feel oxycodone is resulting in any benefit. She was able to eat last night without issue, ambulate, and void. Denies any upper or lower extremity symptoms such as pain/numbness/tingling.   POD0: Status post C2-C5 ACDF for spondylolisthesis and cervicalgia.  Seen operatively in recovery, still disoriented from anesthesia able to answer question obey commands.  Complains of posterior cervical pain.  Denies any upper extremity pain/numbness/tingling/weakness.  Denies any issues swallowing.  Physical Exam: Vitals:   10/19/18 0030 10/19/18 0435  BP: (!) 145/89 126/81  Pulse: 71 76  Resp: 14 13  Temp: 97.9 F (36.6 C) 98.2 F (36.8 C)  SpO2: 96% 95%    AA Ox3 Strength:upper: 5/5 throughout. Right lower at least 4+/5 (baseline weakness). Left lower 5/5 Sensation: Intact and symmetric throughout upper and lower extremities  Skin: glue at incision site intact. Mild bruising around site. No edema, fluctuance, bleeding, drainage.   Data:  No results for input(s): NA, K, CL, CO2, BUN, CREATININE, LABGLOM, GLUCOSE, CALCIUM in the last 168 hours. No results for input(s): AST, ALT, ALKPHOS in the last 168 hours.  Invalid input(s): TBILI   No results for input(s): WBC, HGB, HCT, PLT in the last 168 hours. No results for input(s): APTT, INR in the last 168 hours.       Other tests/results: EXAM: CERVICAL SPINE - 2-3 VIEW 10/19/2018  COMPARISON:  10/18/2018.  FINDINGS: C2 through C5 anterior interbody fusion. Hardware intact. Anatomic alignment. No acute bony abnormality. Diffuse degenerative change. Biapical pleural thickening consistent scarring.  IMPRESSION: Through C5 anterior interbody fusion.  Hardware intact.  Assessment/Plan:  Abigail [name] POD1 status post C2-C5 ACDF for  spondylolisthesis and cervicalgia.  Complains of significant posterior cervical pain. Will attempt toradol injection and increase in muscle relaxer.   - mobilize - pain control - DVT prophylaxis  Marin Olp PA-C Department of Neurosurgery

## 2018-10-19 NOTE — Plan of Care (Signed)

## 2018-10-19 NOTE — Care Management (Signed)
Patient is independent and walking 2 laps with nurse today

## 2018-10-19 NOTE — Progress Notes (Signed)
Pt ambulated around nurses station twice with NT with no difficulties. Pt wore aspen brace during ambulation.

## 2018-10-20 DIAGNOSIS — M542 Cervicalgia: Secondary | ICD-10-CM | POA: Diagnosis not present

## 2018-10-20 MED ORDER — OXYCODONE HCL 5 MG PO TABS: 5.0000 mg | ORAL_TABLET | ORAL | 0 refills | Status: DC | PRN

## 2018-10-20 MED ORDER — METHOCARBAMOL 500 MG PO TABS: 1000.0000 mg | ORAL_TABLET | Freq: Four times a day (QID) | ORAL | 0 refills | Status: DC

## 2018-10-20 NOTE — Discharge Summary (Signed)
Procedure: C2-C5 ACDF Procedure date: 10/18/2018 Diagnosis: Spondylolisthesis  History:  POD2: Continues to do well with recovery. Pain has improved to a tolerable level although she rates the pain 7.5 at this time. Continues to deny any upper extremity symptoms. Ambulated and voiding without issue. Expresses discomfort when swallowing, but able to tolerate liquids and soft foods.   POD1: Recovering well but complains of 9/10 posterior cervical pain more prominent on the right. Does not feel oxycodone is resulting in any benefit. She was able to eat last night without issue, ambulate, and void. Denies any upper or lower extremity symptoms such as pain/numbness/tingling.   POD0: Status post C2-C5 ACDF for spondylolisthesis and cervicalgia.  Seen operatively in recovery, still disoriented from anesthesia able to answer question obey commands.  Complains of posterior cervical pain.  Denies any upper extremity pain/numbness/tingling/weakness.  Denies any issues swallowing.  Physical Exam: Vitals:   10/20/18 0410 10/20/18 0741  BP: 134/82 (!) 142/90  Pulse: 70 69  Resp: 13   Temp: 98.3 F (36.8 C) 98 F (36.7 C)  SpO2: 99% 96%    AA Ox3 Strength:upper: 5/5 throughout upper extremities.  Sensation: Intact and symmetric throughout upper extremities  Skin: glue at incision site intact. Bruising around incision site with mild swelling but minimal tenderness. No fluctuance, bleeding, drainage.   Data:  No results for input(s): NA, K, CL, CO2, BUN, CREATININE, LABGLOM, GLUCOSE, CALCIUM in the last 168 hours. No results for input(s): AST, ALT, ALKPHOS in the last 168 hours.  Invalid input(s): TBILI   No results for input(s): WBC, HGB, HCT, PLT in the last 168 hours. No results for input(s): APTT, INR in the last 168 hours.       Other tests/results: EXAM: CERVICAL SPINE - 2-3 VIEW 10/19/2018  COMPARISON:  10/18/2018.  FINDINGS: C2 through C5 anterior interbody fusion. Hardware  intact. Anatomic alignment. No acute bony abnormality. Diffuse degenerative change. Biapical pleural thickening consistent scarring.  IMPRESSION: Through C5 anterior interbody fusion.  Hardware intact.  Assessment/Plan:  Abigail [name] POD2 status post C2-C5 ACDF for spondylolisthesis and cervicalgia.  Ambulating, voiding, and tolerating soft foods without issue. Advised to continue soft foods until solid diet is more tolerable. Pain has improved to tolerable level with increase in robaxin. Will continue pain control with pain medications, tylenol, and robaxin as needed. She is scheduled to follow up in clinic in 2 weeks to monitor progress. Advised to contact office if any questions or concerns arise before then.     Marin Olp PA-C Department of Neurosurgery

## 2018-10-20 NOTE — Discharge Instructions (Signed)

## 2018-10-20 NOTE — Care Management (Signed)
Patient to DC today and is independent walking, has follow up appointment

## 2018-10-23 NOTE — Anesthesia Postprocedure Evaluation (Signed)
Anesthesia Post Note  Patient: Abigail [name]  Procedure(s) Performed: ANTERIOR CERVICAL DECOMPRESSION/DISCECTOMY FUSION C 2-5 (N/A Neck)  Patient location during evaluation: PACU Anesthesia Type: General Level of consciousness: awake and alert Pain management: pain level controlled Vital Signs Assessment: post-procedure vital signs reviewed and stable Respiratory status: spontaneous breathing, nonlabored ventilation, respiratory function stable and patient connected to nasal cannula oxygen Cardiovascular status: blood pressure returned to baseline and stable Postop Assessment: no apparent nausea or vomiting Anesthetic complications: no     Last Vitals:  Vitals:   10/20/18 0410 10/20/18 0741  BP: 134/82 (!) 142/90  Pulse: 70 69  Resp: 13   Temp: 36.8 C 36.7 C  SpO2: 99% 96%    Last Pain:  Vitals:   10/20/18 0741  TempSrc: Oral  PainSc:                  Precious Haws Lovetta Condie

## 2018-11-27 ENCOUNTER — Other Ambulatory Visit: Payer: Self-pay | Admitting: Orthopedic Surgery

## 2018-11-27 DIAGNOSIS — M1712 Unilateral primary osteoarthritis, left knee: Secondary | ICD-10-CM

## 2018-11-27 DIAGNOSIS — M25562 Pain in left knee: Secondary | ICD-10-CM

## 2018-12-06 ENCOUNTER — Other Ambulatory Visit: Payer: Self-pay | Admitting: Student

## 2018-12-06 DIAGNOSIS — M5442 Lumbago with sciatica, left side: Principal | ICD-10-CM

## 2018-12-06 DIAGNOSIS — M545 Low back pain, unspecified: Secondary | ICD-10-CM

## 2018-12-06 DIAGNOSIS — M5441 Lumbago with sciatica, right side: Secondary | ICD-10-CM

## 2018-12-07 ENCOUNTER — Ambulatory Visit
Admission: RE | Admit: 2018-12-07 | Discharge: 2018-12-07 | Disposition: A | Discharge status: Home / Self Care | Payer: 59 | Source: Ambulatory Visit | Attending: Orthopedic Surgery | Admitting: Orthopedic Surgery

## 2018-12-07 DIAGNOSIS — M1712 Unilateral primary osteoarthritis, left knee: Secondary | ICD-10-CM

## 2018-12-07 DIAGNOSIS — M25562 Pain in left knee: Secondary | ICD-10-CM | POA: Diagnosis present

## 2018-12-10 ENCOUNTER — Other Ambulatory Visit: Payer: Self-pay

## 2018-12-10 ENCOUNTER — Ambulatory Visit
Admission: RE | Admit: 2018-12-10 | Discharge: 2018-12-10 | Disposition: A | Discharge status: Home / Self Care | Payer: 59 | Source: Ambulatory Visit | Attending: Student | Admitting: Student

## 2018-12-10 DIAGNOSIS — M5441 Lumbago with sciatica, right side: Secondary | ICD-10-CM | POA: Insufficient documentation

## 2018-12-10 DIAGNOSIS — M545 Low back pain, unspecified: Secondary | ICD-10-CM

## 2018-12-10 DIAGNOSIS — M5442 Lumbago with sciatica, left side: Secondary | ICD-10-CM | POA: Diagnosis present

## 2018-12-26 ENCOUNTER — Other Ambulatory Visit: Payer: Self-pay

## 2018-12-26 ENCOUNTER — Ambulatory Visit: Payer: 59 | Attending: Nurse Practitioner | Admitting: Nurse Practitioner

## 2018-12-26 ENCOUNTER — Encounter: Payer: Self-pay | Admitting: Nurse Practitioner

## 2018-12-26 VITALS — BP 111/83 | HR 73 | Temp 98.2°F | Resp 16 | Ht 65.0 in | Wt 164.0 lb

## 2018-12-26 DIAGNOSIS — Z79899 Other long term (current) drug therapy: Secondary | ICD-10-CM | POA: Diagnosis not present

## 2018-12-26 DIAGNOSIS — F1190 Opioid use, unspecified, uncomplicated: Secondary | ICD-10-CM | POA: Insufficient documentation

## 2018-12-26 DIAGNOSIS — G8929 Other chronic pain: Secondary | ICD-10-CM | POA: Insufficient documentation

## 2018-12-26 DIAGNOSIS — Z789 Other specified health status: Secondary | ICD-10-CM | POA: Insufficient documentation

## 2018-12-26 DIAGNOSIS — M25562 Pain in left knee: Secondary | ICD-10-CM | POA: Diagnosis not present

## 2018-12-26 DIAGNOSIS — M899 Disorder of bone, unspecified: Secondary | ICD-10-CM | POA: Insufficient documentation

## 2018-12-26 DIAGNOSIS — G894 Chronic pain syndrome: Secondary | ICD-10-CM | POA: Diagnosis not present

## 2018-12-26 NOTE — Patient Instructions (Signed)

## 2018-12-26 NOTE — Progress Notes (Signed)
Patient's Name: Abigail [name]  MRN: [id number]  Referring Provider: Duanne Guess, PA-C  DOB: 04/04/1966  PCP: Aletha Halim., PA-C  DOS: 12/26/2018  Note by: Dionisio David NP  Service setting: Ambulatory outpatient  Specialty: Interventional Pain Management  Location: [facility] (AMB) Pain Management Facility    Patient type: New Patient    Primary Reason(s) for Visit: Initial Patient Evaluation CC: Knee Pain (right); Neck Pain (shoulders bilateral); and Hip Pain (left)  HPI  Abigail [name] is a 53 y.o. year old, female patient, who comes today for an initial evaluation. She has Intervertebral disc disorder with myelopathy, lumbar region; Spinal stenosis, lumbar; Spinal stenosis, lumbar region, with neurogenic claudication; Lumbar disc herniation with radiculopathy; Recurrent herniation of lumbar disc; Herniated lumbar intervertebral disc; TIA (transient ischemic attack); Hypertension; Decreased sensation of foot; Cervical radiculopathy; Adrenal insufficiency ([place]); Depression; Benign essential hypertension; Carpal tunnel syndrome, bilateral upper limbs; Chronic bilateral low back pain without sciatica; Chronic daily headache; Enlarged pituitary gland ([place]); Headache disorder; Insomnia; Lightheadedness; Mixed incontinence; Neck pain, chronic; Nicotine use disorder; Osteoarthritis of multiple joints; Pain of left hip joint; Pure hypercholesterolemia; Sleeping difficulty; Snoring; Weakness; Chronic pain of left knee (Primary Area of Pain); Chronic pain syndrome; Long term prescription benzodiazepine use; Opiate use; Pharmacologic therapy; Disorder of skeletal system; and Problems influencing health status on their problem list.. Her primarily concern today is the Knee Pain (right); Neck Pain (shoulders bilateral); and Hip Pain (left)  Pain Assessment: Location: Left Knee Radiating: side of knee to back of knee Onset: Other (comment)(3-5 years) Duration: Chronic pain Quality: Aching, Constant, Burning,  Throbbing, Stabbing Severity: 8 /10 (subjective, self-reported pain score)  Note: Reported level is compatible with observation.                         When using our objective Pain Scale, levels between 6 and 10/10 are said to belong in an emergency room, as it progressively worsens from a 6/10, described as severely limiting, requiring emergency care not usually available at an outpatient pain management facility. At a 6/10 level, communication becomes difficult and requires great effort. Assistance to reach the emergency department may be required. Facial flushing and profuse sweating along with potentially dangerous increases in heart rate and blood pressure will be evident. Effect on ADL: prolonged walking, standing, showering and drying off Timing: Constant Modifying factors: cane, rest BP: 111/83  HR: 73  Onset and Duration: Gradual Cause of pain: genetic genes Severity: Getting worse, NAS-11 at its worse: 10/10, NAS-11 at its best: 8/10, NAS-11 now: 9/10 and NAS-11 on the average: 10/10 Timing: Morning, Afternoon, Night and During activity or exercise Aggravating Factors: Bending, Climbing, Kneeling, Lifiting, Motion, Prolonged sitting, Prolonged standing, Squatting, Stooping , Twisting, Walking, Walking uphill and Walking downhill Alleviating Factors: Lying down Associated Problems: Depression, Dizziness, Fatigue, Inability to concentrate, Inability to control bladder (urine), Numbness, Sadness, Spasms, Sweating, Temperature changes and Tingling Quality of Pain: Aching, Agonizing, Annoying, Cruel, Deep, Disabling, Distressing, Dreadful, Dull, Exhausting, Fearful, Feeling of constriction, Feeling of weight, Getting longer, Hot, Nagging, Pressure-like, Pulsating, Punishing, Sharp, Shooting, Stabbing, Tender, Throbbing, Toothache-like and Uncomfortable Previous Examinations or Tests: MRI scan, X-rays, Nerve conduction test, Neurological evaluation, Neurosurgical evaluation and Orthopedic  evaluation Previous Treatments: Steroid treatments  The patient comes into the clinics today for the first time for a chronic pain management evaluation.  According to the patient her primary area of pain is in her left knee.  She has had steroid injections  and gel injections which were not effective.  She denies any recent physical therapy.  She has had recent images.  Today I took the time to provide the patient with information regarding this pain practice. The patient was informed that the practice is divided into two sections: an interventional pain management section, as well as a completely separate and distinct medication management section. I explained that there are procedure days for interventional therapies, and evaluation days for follow-ups and medication management. Because of the amount of documentation required during both, they are kept separated. This means that there is the possibility that she may be scheduled for a procedure on one day, and medication management the next. I have also informed her that because of staffing and facility limitations, this practice will no longer take patients for medication management only. To illustrate the reasons for this, I gave the patient the example of surgeons, and how inappropriate it would be to refer a patient to his/her care, just to write for the post-surgical antibiotics on a surgery done by a different surgeon.   Because interventional pain management is part of the board-certified specialty for the doctors, the patient was informed that joining this practice means that they are open to any and all interventional therapies. I made it clear that this does not mean that they will be forced to have any procedures done. What this means is that I believe interventional therapies to be essential part of the diagnosis and proper management of chronic pain conditions. Therefore, patients not interested in these interventional alternatives will be  better served under the care of a different practitioner.  The patient was also made aware of my Comprehensive Pain Management Safety Guidelines where by joining this practice, they limit all of their nerve blocks and joint injections to those done by our practice, for as long as we are retained to manage their care. Historic Controlled Substance Pharmacotherapy Review  PMP and historical list of controlled substances: Pregabalin 75 mg, alprazolam 0.5 mg, hydrocodone/acetaminophen 5/325 mg, oxycodone 5 mg, tramadol 50 mg, tramadol/acetaminophen 37.5/325 mg, oxycodone/acetaminophen 5/325 mg, Ativan 0.25 mg, oxycodone/acetaminophen 10/325 mg, Highest opioid analgesic regimen found: Oxycodone/acetaminophen 10/325 mg 1 tablet 5 times daily entheses fill date 03/08/2013) oxycodone 50 mg/day Most recent opioid analgesic: Hydrocodone/acetaminophen 5/325 1 tablet 4 times daily (fill date 11/13/2018) hydrocodone 20 mg/day Current opioid analgesics: None Highest recorded MME/day: 85.71 mg/day MME/day: 0 mg/day Medications: The patient did not bring the medication(s) to the appointment, as requested in our "New Patient Package" Pharmacodynamics: Desired effects: Analgesia: The patient reports >50% benefit. Reported improvement in function: The patient reports medication allows her to accomplish basic ADLs. Clinically meaningful improvement in function (CMIF): Sustained CMIF goals met Perceived effectiveness: Described as relatively effective, allowing for increase in activities of daily living (ADL) Undesirable effects: Side-effects or Adverse reactions: None reported Historical Monitoring: The patient  reports no history of drug use. List of all UDS Test(s): Lab Results  Component Value Date   COCAINSCRNUR NONE DETECTED 06/02/2014   THCU POSITIVE (A) 06/02/2014   ETH <11 06/02/2014   List of all Serum Drug Screening Test(s):  No results found for: AMPHSCRSER, BARBSCRSER, BENZOSCRSER, COCAINSCRSER,  PCPSCRSER, PCPQUANT, THCSCRSER, CANNABQUANT, OPIATESCRSER, OXYSCRSER, PROPOXSCRSER Historical Background Evaluation: [place] PDMP: Six (6) year initial data search conducted.              Department of public safety, offender search: Editor, commissioning Information) Non-contributory Risk Assessment Profile: Aberrant behavior: None observed or detected today Risk factors for  fatal opioid overdose: None identified today Fatal overdose hazard ratio (HR): Calculation deferred Non-fatal overdose hazard ratio (HR): Calculation deferred Risk of opioid abuse or dependence: 0.7-3.0% with doses ? 36 MME/day and 6.1-26% with doses ? 120 MME/day. Substance use disorder (SUD) risk level: Pending results of Medical Psychology Evaluation for SUD Opioid risk tool (ORT) (Total Score): 7  ORT Scoring interpretation table:  Score <3 = Low Risk for SUD  Score between 4-7 = Moderate Risk for SUD  Score >8 = High Risk for Opioid Abuse   PHQ-2 Depression Scale:  Total score: 3  PHQ-2 Scoring interpretation table: (Score and probability of major depressive disorder)  Score 0 = No depression  Score 1 = 15.4% Probability  Score 2 = 21.1% Probability  Score 3 = 38.4% Probability  Score 4 = 45.5% Probability  Score 5 = 56.4% Probability  Score 6 = 78.6% Probability   PHQ-9 Depression Scale:  Total score: 14  PHQ-9 Scoring interpretation table:  Score 0-4 = No depression  Score 5-9 = Mild depression  Score 10-14 = Moderate depression  Score 15-19 = Moderately severe depression  Score 20-27 = Severe depression (2.4 times higher risk of SUD and 2.89 times higher risk of overuse)   Pharmacologic Plan: Pending ordered tests and/or consults  Meds  The patient has a current medication list which includes the following prescription(s): acetaminophen, alprazolam, amitriptyline, amlodipine-olmesartan, atorvastatin, celecoxib, cholecalciferol, duloxetine, fluticasone, fremanezumab-vfrm, hydrocortisone, methocarbamol, metoprolol  succinate, pregabalin, tizanidine, fluticasone furoate-vilanterol, and oxycodone.  Current Outpatient Medications on File Prior to Visit  Medication Sig  . acetaminophen (TYLENOL) 500 MG tablet Take 500 mg by mouth every 6 (six) hours as needed.  . ALPRAZolam (XANAX) 0.5 MG tablet Take 0.5 mg by mouth 2 (two) times daily as needed for anxiety.   Marland Kitchen amitriptyline (ELAVIL) 100 MG tablet Take 100 mg by mouth at bedtime.  Marland Kitchen amLODipine-olmesartan (AZOR) 5-20 MG per tablet Take 1 tablet by mouth daily before breakfast.  . atorvastatin (LIPITOR) 20 MG tablet Take 20 mg by mouth daily.  . celecoxib (CELEBREX) 200 MG capsule Take 200 mg by mouth 2 (two) times daily.  . Cholecalciferol (VITAMIN D-1000 MAX ST) 25 MCG (1000 UT) tablet Take 1,000 Units by mouth daily.  . DULoxetine (CYMBALTA) 60 MG capsule Take 60 mg by mouth 2 (two) times daily.  . fluticasone (FLONASE) 50 MCG/ACT nasal spray Place 2 sprays into both nostrils 2 (two) times daily.  . Fremanezumab-vfrm (AJOVY) 225 MG/1.5ML SOSY Inject 225 mg into the skin every 28 (twenty-eight) days.  . hydrocortisone (CORTEF) 5 MG tablet Take 5-10 mg by mouth See admin instructions. Take 34m by mouth in the morning and 580min the evening  . methocarbamol (ROBAXIN) 500 MG tablet Take 2 tablets (1,000 mg total) by mouth 4 (four) times daily.  . metoprolol succinate (TOPROL-XL) 50 MG 24 hr tablet Take 50 mg by mouth daily. Take with or immediately following a meal.  . pregabalin (LYRICA) 75 MG capsule Take 75 mg by mouth 3 (three) times daily.  . Marland KitcheniZANidine (ZANAFLEX) 2 MG tablet Take by mouth 3 (three) times daily.  . fluticasone furoate-vilanterol (BREO ELLIPTA) 100-25 MCG/INH AEPB Inhale 1 puff into the lungs daily.  . Marland KitchenxyCODONE (OXY IR/ROXICODONE) 5 MG immediate release tablet Take 1 tablet (5 mg total) by mouth every 3 (three) hours as needed for moderate pain ((score 4 to 6)). (Patient not taking: Reported on 12/26/2018)   No current  facility-administered medications on file prior to visit.  Imaging Review  Cervical Imaging: Cervical MR wo contrast:  Results for orders placed during the hospital encounter of 01/24/15  MR Cervical Spine Wo Contrast   Narrative CLINICAL DATA:  53 year old female with widespread pain with tingling and numbness. Right greater than left cervical neck pain. No known injury. Initial encounter.  EXAM: MRI CERVICAL SPINE WITHOUT CONTRAST  TECHNIQUE: Multiplanar, multisequence MR imaging of the cervical spine was performed. No intravenous contrast was administered.  COMPARISON:  Cervical spine radiographs 06/06/2014. Brain MRI 06/03/2014. [facility] 08/04/2013. Cervical spine MRI  FINDINGS: Chronic straightening of cervical lordosis. Chronic mild anterolisthesis of C2 on C3 is stable since 2014. Mild anterolisthesis of C3 on C4, and also trace anterolisthesis of C4 on C5 and C6 on C7 also appears stable. At the same time however there is mild increased STIR signal in the chronically degenerated right C3-C4 facets (series 4, image 3) and associated fluid in that facet joint. This appears to be degenerative in nature. See additional details below.  No other acute osseous abnormality. Cervicomedullary junction is within normal limits. Spinal cord signal is within normal limits at all visualized levels.  Negative paraspinal soft tissues.  C2-C3: Chronic moderate facet hypertrophy on the right. No significant stenosis.  C3-C4: Chronic anterolisthesis with right eccentric disc bulge. A chronic moderate facet hypertrophy greater on the right. New right facet joint fluid. No significant spinal stenosis. Mild right C4 foraminal stenosis has not significantly changed.  C4-C5: Chronic right eccentric disc bulge and up to moderate facet hypertrophy. Mild uncovertebral hypertrophy on the right. No significant stenosis.  C5-C6: Minimal disc bulge and left  uncovertebral hypertrophy. No stenosis.  C6-C7: Mild to moderate facet hypertrophy greater on the right. No stenosis.  C7-T1:  Mild facet hypertrophy.  No stenosis.  No upper thoracic spinal or foraminal stenosis.  IMPRESSION: 1. Dominant finding in the cervical spine is multilevel chronic mild spondylolisthesis with associated facet arthropathy at each affected level. 2. Mild inflammation of the chronically degenerated right C3-C4 facet is most compatible with an acute exacerbation of the chronic facet joint arthritis. 3. Comparatively mild cervical disc degeneration and no cervical spinal stenosis.   Electronically Signed   By: Genevie Ann M.D.   On: 01/24/2015 22:25   :  Results for orders placed during the hospital encounter of 10/18/18  DG Cervical Spine 2 or 3 views   Narrative CLINICAL DATA:  Cervical spine fusion.  EXAM: CERVICAL SPINE - 2-3 VIEW  COMPARISON:  10/18/2018.  FINDINGS: C2 through C5 anterior interbody fusion. Hardware intact. Anatomic alignment. No acute bony abnormality. Diffuse degenerative change. Biapical pleural thickening consistent scarring.  IMPRESSION: Through C5 anterior interbody fusion.  Hardware intact.   Electronically Signed   By: Marcello Moores  Register   On: 10/19/2018 07:27   DG Cervical Spine 2-3 Views   Narrative CLINICAL DATA:  Cervical fusion.  EXAM: CERVICAL SPINE - 2-3 VIEW; DG C-ARM 61-120 MIN  COMPARISON:  None.  FINDINGS: Four spot fluoroscopic images of the cervical spine are submitted from the operating room. Initial image demonstrates anterior localization of the C2-3 disc space. Subsequent images demonstrate anterior discectomy and fusion from C2 through C5 using an anterior plate, screws and intervertebral bone plugs. On the final lateral image, there is a slight anterolisthesis at C4-5. Hardware appears well positioned.  IMPRESSION: Intraoperative views during C2-5 ACDF. Slight anterolisthesis at C4-5 on  final lateral view. Standard radiographic evaluation recommended.   Electronically Signed   By: Caryl Comes.D.  On: 10/18/2018 14:41    Results for orders placed during the hospital encounter of 06/06/14  DG Cervical Spine Complete   Narrative CLINICAL DATA:  Right-sided neck pain radiating to the shoulder arm for several weeks. Syncope with fall tonight. Increasing neck pain.  EXAM: CERVICAL SPINE  4+ VIEWS  COMPARISON:  MRI cervical spine 08/04/2013  FINDINGS: Slight anterior subluxation of C3 on C4 was present on previous MRI. Otherwise normal alignment of the cervical spine and facet joints. Degenerative changes throughout the facet joints. C1-2 articulation appears intact. Intervertebral disc space heights are preserved. No vertebral compression deformities. No prevertebral soft tissue swelling. No focal bone lesion or bone destruction.  IMPRESSION: Slight anterior subluxation of C3 on C4 has been present on previous studies and is likely degenerative. Degenerative changes throughout the facet joints. No acute changes demonstrated.   Electronically Signed   By: Lucienne Capers M.D.   On: 06/06/2014 23:51   Lumbosacral Imaging: Lumbar MR wo contrast:  Results for orders placed during the hospital encounter of 12/10/18  MR LUMBAR SPINE WO CONTRAST   Narrative CLINICAL DATA:  Progressively worsening chronic low back pain. Prior lumbar surgeries.  EXAM: MRI LUMBAR SPINE WITHOUT CONTRAST  TECHNIQUE: Multiplanar, multisequence MR imaging of the lumbar spine was performed. No intravenous contrast was administered.  COMPARISON:  09/24/2013  FINDINGS: Segmentation:  Standard.  Alignment:  Unchanged grade 1 retrolisthesis of L5 on S1.  Vertebrae: No fracture, suspicious osseous lesion, or significant marrow edema. Progressive chronic degenerative marrow changes at L5-S1. Small superior endplate Schmorl's nodes at T12, L1, and L3, unchanged. Small Tarlov  cysts at S2 and S3.  Conus medullaris and cauda equina: Conus extends to the L1-2 level. Conus and cauda equina appear normal.  Paraspinal and other soft tissues: Unchanged 1 cm T2 hyperintense lesion in the left kidney, likely a cyst. Postoperative changes in the posterior lower lumbar soft tissues. No fluid collection.  Disc levels:  Disc desiccation throughout most of the lumbar and lower thoracic spine, greatest at L4-5 and L5-S1 where there is mild and severe disc space narrowing, respectively.  T12-L1 through L3-4: Negative.  L4-5: Mild disc bulging and mild facet and ligamentum flavum hypertrophy, at most slightly progressed from the prior study. No significant stenosis.  L5-S1: Bilateral laminectomies, new on the left since the prior study. Epidural fibrosis is most notable about the right S1 nerve root. No recurrent disc herniation or spinal or lateral recess stenosis is identified. Disc bulging, endplate spurring, severe disc space height loss, and moderate facet hypertrophy result in moderate bilateral neural foraminal stenosis, not significantly changed.  IMPRESSION: 1. Postsurgical changes and progressive disc degeneration at L5-S1 without evidence of recurrent disc herniation or spinal stenosis. Unchanged moderate bilateral neural foraminal stenosis. 2. Mild disc bulging and posterior element hypertrophy at L4-5 without stenosis.   Electronically Signed   By: Logan Bores M.D.   On: 12/10/2018 13:45    Results for orders placed during the hospital encounter of 09/27/13  DG Lumbar Spine 2-3 Views   Narrative CLINICAL DATA:  Lumbar disc protrusion.  EXAM: LUMBAR SPINE - 2-3 VIEW  COMPARISON:  MRI dated 09/24/2013  FINDINGS: There is marked narrowing of the L5-S1 disc space. The remainder of the lumbar spine is normal.  IMPRESSION: Degenerative disc disease at L5-S1. Vertebra are labeled for intraoperative comparison.   Electronically Signed    By: Rozetta Nunnery M.D.   On: 09/27/2013 14:14   Hip Imaging:  Hip-R MR wo contrast:  Results for orders placed during the hospital encounter of 06/20/18  MR HIP RIGHT WO CONTRAST   Narrative CLINICAL DATA:  Chronic bilateral hip pain.  EXAM: MR OF THE LEFT HIP WITHOUT CONTRAST  MR OF THE RIGHT HIP WITHOUT CONTRAST  TECHNIQUE: Multiplanar, multisequence MR imaging was performed. No intravenous contrast was administered.  COMPARISON:  CT abdomen pelvis dated March 21, 2013.  FINDINGS: Bones: There is no evidence of acute fracture, dislocation or avascular necrosis. The visualized bony pelvis appears normal. The visualized sacroiliac joints and symphysis pubis appear normal. Degenerative disc disease at L5-S1.  Articular cartilage and labrum  Articular cartilage: No focal chondral defect or subchondral signal abnormality identified.  Labrum: Partial tear of the left anterior superior labrum. The right labrum is intact.  Joint or bursal effusion  Joint effusion: No significant hip joint effusion.  Bursae: No focal periarticular fluid collection.  Muscles and tendons  Muscles and tendons: The visualized gluteus, hamstring and iliopsoas tendons appear normal. No muscle edema or atrophy.  Other findings  Miscellaneous: Prior hysterectomy. The visualized internal pelvic contents otherwise appear unremarkable.  IMPRESSION: 1. Partial tear of the left anterior superior labrum. 2. No acute osseous abnormality or significant degenerative changes.   Electronically Signed   By: Titus Dubin M.D.   On: 06/20/2018 16:25    Hip-L MR wo contrast:  Results for orders placed during the hospital encounter of 06/20/18  MR HIP LEFT WO CONTRAST   Narrative CLINICAL DATA:  Chronic bilateral hip pain.  EXAM: MR OF THE LEFT HIP WITHOUT CONTRAST  MR OF THE RIGHT HIP WITHOUT CONTRAST  TECHNIQUE: Multiplanar, multisequence MR imaging was performed. No intravenous contrast  was administered.  COMPARISON:  CT abdomen pelvis dated March 21, 2013.  FINDINGS: Bones: There is no evidence of acute fracture, dislocation or avascular necrosis. The visualized bony pelvis appears normal. The visualized sacroiliac joints and symphysis pubis appear normal. Degenerative disc disease at L5-S1.  Articular cartilage and labrum  Articular cartilage: No focal chondral defect or subchondral signal abnormality identified.  Labrum: Partial tear of the left anterior superior labrum. The right labrum is intact.  Joint or bursal effusion  Joint effusion: No significant hip joint effusion.  Bursae: No focal periarticular fluid collection.  Muscles and tendons  Muscles and tendons: The visualized gluteus, hamstring and iliopsoas tendons appear normal. No muscle edema or atrophy.  Other findings  Miscellaneous: Prior hysterectomy. The visualized internal pelvic contents otherwise appear unremarkable.  IMPRESSION: 1. Partial tear of the left anterior superior labrum. 2. No acute osseous abnormality or significant degenerative changes.   Electronically Signed   By: Titus Dubin M.D.   On: 06/20/2018 16:25    Knee Imaging:  Knee-L MR w contrast:  Results for orders placed during the hospital encounter of 12/07/18  MR KNEE LEFT WO CONTRAST   Narrative CLINICAL DATA:  Diffuse left knee pain and locking for 1 year.  EXAM: MRI OF THE LEFT KNEE WITHOUT CONTRAST  TECHNIQUE: Multiplanar, multisequence MR imaging of the knee was performed. No intravenous contrast was administered.  COMPARISON:  None.  FINDINGS: MENISCI  Medial meniscus:  Intact.  Lateral meniscus:  Intact.  LIGAMENTS  Cruciates:  Intact.  Collaterals:  Intact.  CARTILAGE  Patellofemoral: Fissuring and irregularity are seen about both the medial and lateral patellar facets, worst in the upper pole of the medial facet and mid to lower pole of the lateral facet.  Medial:   Normal.  Lateral:  Normal.  Joint:  Trace amount of joint fluid.  Popliteal Fossa:  No Baker's cyst.  Extensor Mechanism:  Intact.  Bones: Scattered small subchondral cysts and mild edema are seen in the patella at sites of cartilage loss.  Other: None.  IMPRESSION: Negative for meniscal or ligament tear.  Moderate appearing patellofemoral osteoarthritis.   Electronically Signed   By: Inge Rise M.D.   On: 12/07/2018 15:15     Note: Available results from prior imaging studies were reviewed.        ROS  Cardiovascular History: High blood pressure and Chest pain Pulmonary or Respiratory History: Smoking and Snoring  Neurological History: No reported neurological signs or symptoms such as seizures, abnormal skin sensations, urinary and/or fecal incontinence, being born with an abnormal open spine and/or a tethered spinal cord Review of Past Neurological Studies:  Results for orders placed or performed during the hospital encounter of 01/18/18  MR BRAIN W WO CONTRAST   Narrative   CLINICAL DATA:  53 year old female with increasing headaches. Dizziness when standing from a seated position.  EXAM: MRI HEAD WITHOUT AND WITH CONTRAST  TECHNIQUE: Multiplanar, multiecho pulse sequences of the brain and surrounding structures were obtained without and with intravenous contrast.  CONTRAST:  63m MULTIHANCE GADOBENATE DIMEGLUMINE 529 MG/ML IV SOLN  COMPARISON:  Brain MRI 09/08/2017. Cervical spine MRI 01/24/2015. Brain MRI 06/03/2014.  FINDINGS: Brain: Cerebral volume is stable and within normal limits. No restricted diffusion to suggest acute infarction. No midline shift, mass effect, evidence of mass lesion, ventriculomegaly, extra-axial collection or acute intracranial hemorrhage. Cervicomedullary junction within normal limits.  Several small foci of nonspecific FLAIR hyperintensity are re-demonstrated in the left frontal lobe white matter (series 7, image 20)  and stable since 2015. No cortical encephalomalacia or chronic cerebral blood products identified. The deep gray matter nuclei, brainstem, and cerebellum appear stable and normal. Excluding the pituitary region, no abnormal enhancement is identified. No dural thickening.  Vascular: Major intracranial vascular flow voids are stable since 2015.  Skull and upper cervical spine: Mild multilevel upper cervical spondylolisthesis appears stable since 2016. Normal bone marrow signal.  Sinuses/Orbits: Orbits soft tissues appear stable and normal. Paranasal sinuses and mastoids are stable and well pneumatized. Visible internal auditory structures are within normal limits. Scalp and face soft tissues appear negative.  Other: Dedicated pituitary imaging. Normal pituitary size and configuration (series 9, image 6). Normal infundibulum. No suprasellar mass or mass effect. Normal hypothalamus. Un-dynamic and delayed post-contrast images the pituitary gland enhancement is normal. No pituitary mass or lesion identified. The cavernous sinus appears normal. No central skull base abnormality identified.  IMPRESSION: 1. Normal MRI appearance of the pituitary. 2. Stable since 2015 and essentially normal for age MRI appearance of the brain.   Electronically Signed   By: HGenevie AnnM.D.   On: 01/18/2018 12:57   Results for orders placed or performed during the hospital encounter of 09/08/17  MR Brain Wo Contrast   Narrative   CLINICAL DATA:  Intermittent headache for 4 months. Patient was scheduled for outpatient MRI but pain is worsened and patient presents to the ER.  EXAM: MRI HEAD WITHOUT CONTRAST  TECHNIQUE: Multiplanar, multiecho pulse sequences of the brain and surrounding structures were obtained without intravenous contrast.  COMPARISON:  06/03/2014  FINDINGS: Brain: No infarction, hemorrhage, hydrocephalus, extra-axial collection or mass lesion. Xanthogranulomas of the choroid  plexus incidentally noted. No atrophy or chronic blood products. Rare FLAIR hyperintensities in the cerebral white matter to a degree that is commonly seen at  patient's age, nonprogressive.  Vascular: Major flow voids are preserved.  Skull and upper cervical spine: Negative for marrow lesion. C2-3 and C3-4 facet arthropathy with slight anterolisthesis.  Sinuses/Orbits: Negative.  No sinusitis or mastoiditis.  Other: Small retention cysts in the nasopharynx.  IMPRESSION: Negative exam.  No explanation for headache.   Electronically Signed   By: Monte Fantasia M.D.   On: 09/08/2017 18:10   Results for orders placed or performed during the hospital encounter of 06/02/14  CT Head Wo Contrast   Narrative   CLINICAL DATA:  Transient right body numbness today.  EXAM: CT HEAD WITHOUT CONTRAST  TECHNIQUE: Contiguous axial images were obtained from the base of the skull through the vertex without intravenous contrast.  COMPARISON:  Brain MR dated 08/04/2013.  FINDINGS: Normal appearing cerebral hemispheres and posterior fossa structures. Normal size and position of the ventricles. No intracranial hemorrhage, mass lesion or CT evidence of acute infarction. Unremarkable bones and included paranasal sinuses.  IMPRESSION: Normal examination.   Electronically Signed   By: Enrique Sack M.D.   On: 06/02/2014 19:15    Psychological-Psychiatric History: Anxiousness, Depressed, Prone to panicking and Difficulty sleeping and or falling asleep Gastrointestinal History: No reported gastrointestinal signs or symptoms such as vomiting or evacuating blood, reflux, heartburn, alternating episodes of diarrhea and constipation, inflamed or scarred liver, or pancreas or irrregular and/or infrequent bowel movements Genitourinary History: No reported renal or genitourinary signs or symptoms such as difficulty voiding or producing urine, peeing blood, non-functioning kidney, kidney stones,  difficulty emptying the bladder, difficulty controlling the flow of urine, or chronic kidney disease Hematological History: Brusing easily and Bleeding easily Endocrine History: No reported endocrine signs or symptoms such as high or low blood sugar, rapid heart rate due to high thyroid levels, obesity or weight gain due to slow thyroid or thyroid disease Rheumatologic History: Joint aches and or swelling due to excess weight (Osteoarthritis), Generalized muscle aches (Fibromyalgia) and Constant unexplained fatigue (Chronic Fatigue Syndrome) Musculoskeletal History: Negative for myasthenia gravis, muscular dystrophy, multiple sclerosis or malignant hyperthermia Work History: Disabled  Allergies  Ms. Bost has No Known Allergies.  Laboratory Chemistry  Inflammation Markers Lab Results  Component Value Date   ESRSEDRATE 4 09/08/2017   (CRP: Acute Phase) (ESR: Chronic Phase) Renal Function Markers Lab Results  Component Value Date   BUN 16 10/06/2018   CREATININE 0.74 10/06/2018   GFRAA >60 10/06/2018   GFRNONAA >60 10/06/2018   Hepatic Function Markers Lab Results  Component Value Date   AST 11 06/09/2014   ALT 7 06/09/2014   ALBUMIN 3.2 (L) 06/09/2014   ALKPHOS 49 06/09/2014   Electrolytes Lab Results  Component Value Date   NA 137 10/06/2018   K 4.0 10/06/2018   CL 102 10/06/2018   CALCIUM 9.0 10/06/2018   Neuropathy Markers Lab Results  Component Value Date   VITAMINB12 550 08/29/2013   Bone Pathology Markers Lab Results  Component Value Date   ALKPHOS 49 06/09/2014   CALCIUM 9.0 10/06/2018   Coagulation Parameters Lab Results  Component Value Date   INR 0.96 10/06/2018   LABPROT 12.7 10/06/2018   APTT 35 10/06/2018   PLT 291 10/06/2018   Cardiovascular Markers Lab Results  Component Value Date   HGB 14.7 10/06/2018   HCT 45.6 10/06/2018   Note: Lab results reviewed.  [place]  Drug: Ms. Windle  reports no history of drug use. Alcohol:  reports  current alcohol use. Tobacco:  reports that she has been smoking  cigarettes. She has a 10.50 pack-year smoking history. She has never used smokeless tobacco. Medical:  has a past medical history of Adrenal cortex insufficiency ([place]), Arthritis, Complication of anesthesia, Headache(784.0), Hypertension, and Syncope. Family: family history is not on file.  Past Surgical History:  Procedure Laterality Date  . ABDOMINAL HYSTERECTOMY  14 yrs ago  . ANTERIOR CERVICAL DECOMPRESSION/DISCECTOMY FUSION 4 LEVELS N/A 10/18/2018   Procedure: ANTERIOR CERVICAL DECOMPRESSION/DISCECTOMY FUSION C 2-5;  Surgeon: Meade Maw, MD;  Location: [facility] ORS;  Service: Neurosurgery;  Laterality: N/A;  . BACK SURGERY    . BREAST BIOPSY Left 04/10/2015   BENIGN BREAST TISSUE  . CHOLECYSTECTOMY    . EYE SURGERY     lasix  . HEMI-MICRODISCECTOMY LUMBAR LAMINECTOMY LEVEL 1 N/A 09/28/2013   Procedure: HEMI-MICRODISCECTOMY LUMBAR LAMINECTOMY L5-S1 LEFT;  Surgeon: Tobi Bastos, MD;  Location: [facility];  Service: Orthopedics;  Laterality: N/A;  . left foot bone spur surgery  10 yrs ago  . LUMBAR LAMINECTOMY/DECOMPRESSION MICRODISCECTOMY  07/13/2012   Procedure: LUMBAR LAMINECTOMY/DECOMPRESSION MICRODISCECTOMY 1 LEVEL;  Surgeon: Tobi Bastos, MD;  Location: [facility];  Service: Orthopedics;  Laterality: N/A;  Central Decompression Lumbar Laminectomy L5 - S1 (X-Ray)  . LUMBAR LAMINECTOMY/DECOMPRESSION MICRODISCECTOMY Right 03/07/2013   Procedure: LUMBAR DECOMPRESSIVE HEMI LAMINECTOMY AND  MICRODISCECTOMY  L5-S1 RIGHT ;  Surgeon: Tobi Bastos, MD;  Location: [facility];  Service: Orthopedics;  Laterality: Right;   Active Ambulatory Problems    Diagnosis Date Noted  . Intervertebral disc disorder with myelopathy, lumbar region 07/13/2012  . Spinal stenosis, lumbar 07/13/2012  . Spinal stenosis, lumbar region, with neurogenic claudication 03/07/2013  . Lumbar disc herniation with radiculopathy 03/07/2013  . Recurrent  herniation of lumbar disc 09/27/2013  . Herniated lumbar intervertebral disc 09/28/2013  . TIA (transient ischemic attack) 06/02/2014  . Hypertension 06/02/2014  . Decreased sensation of foot 06/02/2014  . Cervical radiculopathy 10/18/2018  . Adrenal insufficiency ([place]) 05/25/2018  . Depression 02/12/2016  . Benign essential hypertension 02/12/2016  . Carpal tunnel syndrome, bilateral upper limbs 10/10/2017  . Chronic bilateral low back pain without sciatica 09/28/2017  . Chronic daily headache 09/14/2017  . Enlarged pituitary gland ([place]) 01/11/2018  . Headache disorder 10/18/2017  . Insomnia 02/12/2016  . Lightheadedness 02/08/2018  . Mixed incontinence 09/28/2017  . Neck pain, chronic 06/19/2018  . Nicotine use disorder 09/13/2018  . Osteoarthritis of multiple joints 03/11/2016  . Pain of left hip joint 12/27/2017  . Pure hypercholesterolemia 02/12/2016  . Sleeping difficulty 09/14/2017  . Snoring 09/14/2017  . Weakness 09/22/2017  . Chronic pain of left knee (Primary Area of Pain) 12/26/2018  . Chronic pain syndrome 12/26/2018  . Long term prescription benzodiazepine use 12/26/2018  . Opiate use 12/26/2018  . Pharmacologic therapy 12/26/2018  . Disorder of skeletal system 12/26/2018  . Problems influencing health status 12/26/2018   Resolved Ambulatory Problems    Diagnosis Date Noted  . No Resolved Ambulatory Problems   Past Medical History:  Diagnosis Date  . Adrenal cortex insufficiency ([place])   . Arthritis   . Complication of anesthesia   . Headache(784.0)   . Syncope    Constitutional Exam  General appearance: Well nourished, well developed, and well hydrated. In no apparent acute distress Vitals:   12/26/18 1111  BP: 111/83  Pulse: 73  Resp: 16  Temp: 98.2 F (36.8 C)  SpO2: 98%  Weight: 164 lb (74.4 kg)  Height: 5' 5"  (1.651 m)   BMI Assessment: Estimated body mass index  is 27.29 kg/m as calculated from the following:   Height as of this encounter:  5' 5"  (1.651 m).   Weight as of this encounter: 164 lb (74.4 kg).  BMI interpretation table: BMI level Category Range association with higher incidence of chronic pain  <18 kg/m2 Underweight   18.5-24.9 kg/m2 Ideal body weight   25-29.9 kg/m2 Overweight Increased incidence by 20%  30-34.9 kg/m2 Obese (Class I) Increased incidence by 68%  35-39.9 kg/m2 Severe obesity (Class II) Increased incidence by 136%  >40 kg/m2 Extreme obesity (Class III) Increased incidence by 254%   BMI Readings from Last 4 Encounters:  12/26/18 27.29 kg/m  12/10/18 27.79 kg/m  10/18/18 28.29 kg/m  10/06/18 27.26 kg/m   Wt Readings from Last 4 Encounters:  12/26/18 164 lb (74.4 kg)  12/10/18 167 lb (75.8 kg)  10/18/18 170 lb (77.1 kg)  10/06/18 163 lb 12.8 oz (74.3 kg)  Psych/Mental status: Alert, oriented x 3 (person, place, & time)       Eyes: PERLA Respiratory: No evidence of acute respiratory distress  Cervical Spine Exam  Inspection: No masses, redness, or swelling Alignment: Symmetrical Functional ROM: Unrestricted ROM      Stability: No instability detected Muscle strength & Tone: Functionally intact Sensory: Unimpaired Palpation: No palpable anomalies              Upper Extremity (UE) Exam    Side: Right upper extremity  Side: Left upper extremity  Inspection: No masses, redness, swelling, or asymmetry. No contractures  Inspection: No masses, redness, swelling, or asymmetry. No contractures  Functional ROM: Unrestricted ROM          Functional ROM: Unrestricted ROM          Muscle strength & Tone: Functionally intact  Muscle strength & Tone: Functionally intact  Sensory: Unimpaired  Sensory: Unimpaired  Palpation: No palpable anomalies              Palpation: No palpable anomalies              Specialized Test(s): Deferred         Specialized Test(s): Deferred          Thoracic Spine Exam  Inspection: No masses, redness, or swelling Alignment: Symmetrical Functional ROM: Unrestricted  ROM Stability: No instability detected Sensory: Unimpaired Muscle strength & Tone: No palpable anomalies  Lumbar Spine Exam  Inspection: No masses, redness, or swelling Alignment: Symmetrical Functional ROM: Unrestricted ROM      Stability: No instability detected Muscle strength & Tone: Functionally intact Sensory: Unimpaired Palpation: No palpable anomalies       Provocative Tests: Lumbar Hyperextension and rotation test: evaluation deferred today       Patrick's Maneuver: evaluation deferred today                    Gait & Posture Assessment  Ambulation: Unassisted Gait: Relatively normal for age and body habitus Posture: WNL   Lower Extremity Exam    Side: Right lower extremity  Side: Left lower extremity  Inspection: No masses, redness, swelling, or asymmetry. No contractures  Inspection: No masses, redness, swelling, or asymmetry. No contractures  Functional ROM: Unrestricted ROM          Functional ROM: Unrestricted ROM          Muscle strength & Tone: Functionally intact  Muscle strength & Tone: Functionally intact  Sensory: Unimpaired  Sensory: Unimpaired  Palpation: No palpable anomalies  Palpation: No palpable anomalies   Assessment  Primary Diagnosis & Pertinent Problem List: The primary encounter diagnosis was Chronic pain syndrome. Diagnoses of Chronic pain of left knee, Long term prescription benzodiazepine use, Opiate use, Pharmacologic therapy, Disorder of skeletal system, and Problems influencing health status were also pertinent to this visit.  Visit Diagnosis: 1. Chronic pain syndrome   2. Chronic pain of left knee   3. Long term prescription benzodiazepine use   4. Opiate use   5. Pharmacologic therapy   6. Disorder of skeletal system   7. Problems influencing health status    Plan of Care  Initial treatment plan:  Please be advised that as per protocol, today's visit has been an evaluation only. We have not taken over the patient's controlled  substance management.  Problem-specific plan: No problem-specific Assessment & Plan notes found for this encounter.  Ordered Lab-work, Procedure(s), Referral(s), & Consult(s): Orders Placed This Encounter  Procedures  . Compliance Drug Analysis, Ur  . Comp. Metabolic Panel (12)  . Magnesium  . Vitamin B12  . Sedimentation rate  . 25-Hydroxyvitamin D Lcms D2+D3  . C-reactive protein  . Ambulatory referral to Physical Therapy   Pharmacotherapy: Medications ordered:  No orders of the defined types were placed in this encounter.  Medications administered during this visit: Sherill L. Farias had no medications administered during this visit.   Pharmacotherapy under consideration:  Opioid Analgesics: The patient was informed that there is no guarantee that she would be a candidate for opioid analgesics. The decision will be made following CDC guidelines. This decision will be based on the results of diagnostic studies, as well as Ms. Hipple's risk profile.  Membrane stabilizer: To be determined at a later time Muscle relaxant: To be determined at a later time NSAID: To be determined at a later time Other analgesic(s): To be determined at a later time   Interventional therapies under consideration: Ms. Lemar was informed that there is no guarantee that she would be a candidate for interventional therapies. The decision will be based on the results of diagnostic studies, as well as Ms. Fortenberry's risk profile.  Possible procedure(s): Diagnostic left knee genicular nerve block Possible left genicular nerve radiofrequency ablation   Provider-requested follow-up: Return for 2nd Visit, w/ Dr. Dossie Arbour, medical record release.  Future Appointments  Date Time Provider [place]  01/22/2019 11:30 AM Milinda Pointer, [place] None    Primary Care Physician: Aletha Halim., PA-C Location: [facility] Note by:  Date: 12/26/2018; Time:  2:35 PM  Pain Score Disclaimer: We use the NRS-11 scale. This is a self-reported, subjective measurement of pain severity with only modest accuracy. It is used primarily to identify changes within a particular patient. It must be understood that outpatient pain scales are significantly less accurate that those used for research, where they can be applied under ideal controlled circumstances with minimal exposure to variables. In reality, the score is likely to be a combination of pain intensity and pain affect, where pain affect describes the degree of emotional arousal or changes in action readiness caused by the sensory experience of pain. Factors such as social and work situation, setting, emotional state, anxiety levels, expectation, and prior pain experience may influence pain perception and show large inter-individual differences that may also be affected by time variables.  Patient instructions provided during this appointment: Patient Instructions   ____________________________________________________________________________________________  Appointment Policy Summary  It is our goal and responsibility to provide the medical community with assistance in the evaluation and management of  patients with chronic pain. Unfortunately our resources are limited. Because we do not have an unlimited amount of time, or available appointments, we are required to closely monitor and manage their use. The following rules exist to maximize their use:  Patient's responsibilities: 1. Punctuality:  At what time should I arrive? You should be physically present in our office 30 minutes before your scheduled appointment. Your scheduled appointment is with your assigned healthcare provider. However, it takes 5-10 minutes to be "checked-in", and another 15 minutes for the nurses to do the admission. If you arrive to our office at the time you were given for your appointment, you will end up being at least 20-25  minutes late to your appointment with the provider. 2. Tardiness:  What happens if I arrive only a few minutes after my scheduled appointment time? You will need to reschedule your appointment. The cutoff is your appointment time. This is why it is so important that you arrive at least 30 minutes before that appointment. If you have an appointment scheduled for 10:00 AM and you arrive at 10:01, you will be required to reschedule your appointment.  3. Plan ahead:  Always assume that you will encounter traffic on your way in. Plan for it. If you are dependent on a driver, make sure they understand these rules and the need to arrive early. 4. Other appointments and responsibilities:  Avoid scheduling any other appointments before or after your pain clinic appointments.  5. Be prepared:  Write down everything that you need to discuss with your healthcare provider and give this information to the admitting nurse. Write down the medications that you will need refilled. Bring your pills and bottles (even the empty ones), to all of your appointments, except for those where a procedure is scheduled. 6. No children or pets:  Find someone to take care of them. It is not appropriate to bring them in. 7. Scheduling changes:  We request "advanced notification" of any changes or cancellations. 8. Advanced notification:  Defined as a time period of more than 24 hours prior to the originally scheduled appointment. This allows for the appointment to be offered to other patients. 9. Rescheduling:  When a visit is rescheduled, it will require the cancellation of the original appointment. For this reason they both fall within the category of "Cancellations".  10. Cancellations:  They require advanced notification. Any cancellation less than 24 hours before the  appointment will be recorded as a "No Show". 11. No Show:  Defined as an unkept appointment where the patient failed to notify or declare to the practice  their intention or inability to keep the appointment.  Corrective process for repeat offenders:  1. Tardiness: Three (3) episodes of rescheduling due to late arrivals will be recorded as one (1) "No Show". 2. Cancellation or reschedule: Three (3) cancellations or rescheduling will be recorded as one (1) "No Show". 3. "No Shows": Three (3) "No Shows" within a 12 month period will result in discharge from the practice. ____________________________________________________________________________________________   ______________________________________________________________________________________________  Specialty Pain Scale  Introduction:  There are significant differences in how pain is reported. The word pain usually refers to physical pain, but it is also a common synonym of suffering. The medical community uses a scale from 0 (zero) to 10 (ten) to report pain level. Zero (0) is described as "no pain", while ten (10) is described as "the worse pain you can imagine". The problem with this scale is that physical pain is reported along  with suffering. Suffering refers to mental pain, or more often yet it refers to any unpleasant feeling, emotion or aversion associated with the perception of harm or threat of harm. It is the psychological component of pain.  Pain Specialists prefer to separate the two components. The pain scale used by this practice is the Verbal Numerical Rating Scale (VNRS-11). This scale is for the physical pain only. DO NOT INCLUDE how your pain psychologically affects you. This scale is for adults 8 years of age and older. It has 11 (eleven) levels. The 1st level is 0/10. This means: "right now, I have no pain". In the context of pain management, it also means: "right now, my physical pain is under control with the current therapy".  General Information:  The scale should reflect your current level of pain. Unless you are specifically asked for the level of your worst pain,  or your average pain. If you are asked for one of these two, then it should be understood that it is over the past 24 hours.  Levels 1 (one) through 5 (five) are described below, and can be treated as an outpatient. Ambulatory pain management facilities such as ours are more than adequate to treat these levels. Levels 6 (six) through 10 (ten) are also described below, however, these must be treated as a hospitalized patient. While levels 6 (six) and 7 (seven) may be evaluated at an urgent care facility, levels 8 (eight) through 10 (ten) constitute medical emergencies and as such, they belong in a hospital's emergency department. When having these levels (as described below), do not come to our office. Our facility is not equipped to manage these levels. Go directly to an urgent care facility or an emergency department to be evaluated.  Definitions:  Activities of Daily Living (ADL): Activities of daily living (ADL or ADLs) is a term used in healthcare to refer to people's daily self-care activities. Health professionals often use a person's ability or inability to perform ADLs as a measurement of their functional status, particularly in regard to people post injury, with disabilities and the elderly. There are two ADL levels: Basic and Instrumental. Basic Activities of Daily Living (BADL  or BADLs) consist of self-care tasks that include: Bathing and showering; personal hygiene and grooming (including brushing/combing/styling hair); dressing; Toilet hygiene (getting to the toilet, cleaning oneself, and getting back up); eating and self-feeding (not including cooking or chewing and swallowing); functional mobility, often referred to as "transferring", as measured by the ability to walk, get in and out of bed, and get into and out of a chair; the broader definition (moving from one place to another while performing activities) is useful for people with different physical abilities who are still able to get  around independently. Basic ADLs include the things many people do when they get up in the morning and get ready to go out of the house: get out of bed, go to the toilet, bathe, dress, groom, and eat. On the average, loss of function typically follows a particular order. Hygiene is the first to go, followed by loss of toilet use and locomotion. The last to go is the ability to eat. When there is only one remaining area in which the person is independent, there is a 62.9% chance that it is eating and only a 3.5% chance that it is hygiene. Instrumental Activities of Daily Living (IADL or IADLs) are not necessary for fundamental functioning, but they let an individual live independently in a community. IADL consist  of tasks that include: cleaning and maintaining the house; home establishment and maintenance; care of others (including selecting and supervising caregivers); care of pets; child rearing; managing money; managing financials (investments, etc.); meal preparation and cleanup; shopping for groceries and necessities; moving within the community; safety procedures and emergency responses; health management and maintenance (taking prescribed medications); and using the telephone or other form of communication.  Instructions:  Most patients tend to report their pain as a combination of two factors, their physical pain and their psychosocial pain. This last one is also known as "suffering" and it is reflection of how physical pain affects you socially and psychologically. From now on, report them separately.  From this point on, when asked to report your pain level, report only your physical pain. Use the following table for reference.  Pain Clinic Pain Levels (0-5/10)  Pain Level Score  Description  No Pain 0   Mild pain 1 Nagging, annoying, but does not interfere with basic activities of daily living (ADL). Patients are able to eat, bathe, get dressed, toileting (being able to get on and off the toilet  and perform personal hygiene functions), transfer (move in and out of bed or a chair without assistance), and maintain continence (able to control bladder and bowel functions). Blood pressure and heart rate are unaffected. A normal heart rate for a healthy adult ranges from 60 to 100 bpm (beats per minute).   Mild to moderate pain 2 Noticeable and distracting. Impossible to hide from other people. More frequent flare-ups. Still possible to adapt and function close to normal. It can be very annoying and may have occasional stronger flare-ups. With discipline, patients may get used to it and adapt.   Moderate pain 3 Interferes significantly with activities of daily living (ADL). It becomes difficult to feed, bathe, get dressed, get on and off the toilet or to perform personal hygiene functions. Difficult to get in and out of bed or a chair without assistance. Very distracting. With effort, it can be ignored when deeply involved in activities.   Moderately severe pain 4 Impossible to ignore for more than a few minutes. With effort, patients may still be able to manage work or participate in some social activities. Very difficult to concentrate. Signs of autonomic nervous system discharge are evident: dilated pupils (mydriasis); mild sweating (diaphoresis); sleep interference. Heart rate becomes elevated (>115 bpm). Diastolic blood pressure (lower number) rises above 100 mmHg. Patients find relief in laying down and not moving.   Severe pain 5 Intense and extremely unpleasant. Associated with frowning face and frequent crying. Pain overwhelms the senses.  Ability to do any activity or maintain social relationships becomes significantly limited. Conversation becomes difficult. Pacing back and forth is common, as getting into a comfortable position is nearly impossible. Pain wakes you up from deep sleep. Physical signs will be obvious: pupillary dilation; increased sweating; goosebumps; brisk reflexes; cold,  clammy hands and feet; nausea, vomiting or dry heaves; loss of appetite; significant sleep disturbance with inability to fall asleep or to remain asleep. When persistent, significant weight loss is observed due to the complete loss of appetite and sleep deprivation.  Blood pressure and heart rate becomes significantly elevated. Caution: If elevated blood pressure triggers a pounding headache associated with blurred vision, then the patient should immediately seek attention at an urgent or emergency care unit, as these may be signs of an impending stroke.    Emergency Department Pain Levels (6-10/10)  Emergency Room Pain 6 Severely limiting. Requires  emergency care and should not be seen or managed at an outpatient pain management facility. Communication becomes difficult and requires great effort. Assistance to reach the emergency department may be required. Facial flushing and profuse sweating along with potentially dangerous increases in heart rate and blood pressure will be evident.   Distressing pain 7 Self-care is very difficult. Assistance is required to transport, or use restroom. Assistance to reach the emergency department will be required. Tasks requiring coordination, such as bathing and getting dressed become very difficult.   Disabling pain 8 Self-care is no longer possible. At this level, pain is disabling. The individual is unable to do even the most "basic" activities such as walking, eating, bathing, dressing, transferring to a bed, or toileting. Fine motor skills are lost. It is difficult to think clearly.   Incapacitating pain 9 Pain becomes incapacitating. Thought processing is no longer possible. Difficult to remember your own name. Control of movement and coordination are lost.   The worst pain imaginable 10 At this level, most patients pass out from pain. When this level is reached, collapse of the autonomic nervous system occurs, leading to a sudden drop in blood pressure and  heart rate. This in turn results in a temporary and dramatic drop in blood flow to the brain, leading to a loss of consciousness. Fainting is one of the body's self defense mechanisms. Passing out puts the brain in a calmed state and causes it to shut down for a while, in order to begin the healing process.    Summary: 1.   Refer to this scale when providing [place] with your pain level. 2.   Be accurate and careful when reporting your pain level. This will help with your care. 3.   Over-reporting your pain level will lead to loss of credibility. 4.   Even a level of 1/10 means that there is pain and will be treated at our facility. 5.   High, inaccurate reporting will be documented as "Symptom Exaggeration", leading to loss of credibility and suspicions of possible secondary gains such as obtaining more narcotics, or wanting to appear disabled, for fraudulent reasons. 6.   Only pain levels of 5 or below will be seen at our facility. 7.   Pain levels of 6 and above will be sent to the Emergency Department and the appointment cancelled.  ______________________________________________________________________________________________

## 2018-12-26 NOTE — Progress Notes (Signed)
Safety precautions to be maintained throughout the outpatient stay will include: orient to surroundings, keep bed in low position, maintain call bell within reach at all times, provide assistance with transfer out of bed and ambulation.  

## 2018-12-29 LAB — 25-HYDROXYVITAMIN D LCMS D2+D3
25-HYDROXY, VITAMIN D-2: 2.7 ng/mL
25-HYDROXY, VITAMIN D: 48 ng/mL

## 2018-12-29 LAB — COMP. METABOLIC PANEL (12)
AST: 13 IU/L (ref 0–40)
Albumin/Globulin Ratio: 2.1 (ref 1.2–2.2)
Albumin: 4.4 g/dL (ref 3.8–4.8)
Alkaline Phosphatase: 59 IU/L (ref 39–117)
BUN / CREAT RATIO: 14 (ref 9–23)
BUN: 12 mg/dL (ref 6–24)
Bilirubin Total: 0.3 mg/dL (ref 0.0–1.2)
CREATININE: 0.87 mg/dL (ref 0.57–1.00)
Calcium: 9.3 mg/dL (ref 8.7–10.2)
Chloride: 99 mmol/L (ref 96–106)
GFR calc Af Amer: 90 mL/min/{1.73_m2} (ref >59)
GFR calc non Af Amer: 78 mL/min/{1.73_m2} (ref >59)
Globulin, Total: 2.1 g/dL (ref 1.5–4.5)
Glucose: 90 mg/dL (ref 65–99)
Potassium: 5 mmol/L (ref 3.5–5.2)
Sodium: 139 mmol/L (ref 134–144)
TOTAL PROTEIN: 6.5 g/dL (ref 6.0–8.5)

## 2018-12-29 LAB — VITAMIN B12: Vitamin B-12: 393 pg/mL (ref 232–1245)

## 2018-12-29 LAB — C-REACTIVE PROTEIN: CRP: 1 mg/L (ref 0–10)

## 2018-12-29 LAB — 25-HYDROXY VITAMIN D LCMS D2+D3: 25-Hydroxy, Vitamin D-3: 45 ng/mL

## 2018-12-29 LAB — SEDIMENTATION RATE: Sed Rate: 11 mm/hr (ref 0–40)

## 2018-12-29 LAB — MAGNESIUM: Magnesium: 2.1 mg/dL (ref 1.6–2.3)

## 2018-12-30 LAB — COMPLIANCE DRUG ANALYSIS, UR

## 2019-01-01 DIAGNOSIS — F1290 Cannabis use, unspecified, uncomplicated: Secondary | ICD-10-CM | POA: Insufficient documentation

## 2019-01-02 ENCOUNTER — Other Ambulatory Visit: Payer: Self-pay

## 2019-01-02 ENCOUNTER — Encounter: Payer: Self-pay | Admitting: Nurse Practitioner

## 2019-01-02 ENCOUNTER — Encounter: Payer: Self-pay | Admitting: Student in an Organized Health Care Education/Training Program

## 2019-01-02 ENCOUNTER — Ambulatory Visit
Payer: 59 | Attending: Student in an Organized Health Care Education/Training Program | Admitting: Student in an Organized Health Care Education/Training Program

## 2019-01-02 VITALS — BP 112/86 | HR 88 | Temp 98.3°F | Resp 16 | Ht 65.0 in | Wt 164.0 lb

## 2019-01-02 DIAGNOSIS — M1712 Unilateral primary osteoarthritis, left knee: Secondary | ICD-10-CM

## 2019-01-02 DIAGNOSIS — E2740 Unspecified adrenocortical insufficiency: Secondary | ICD-10-CM | POA: Diagnosis present

## 2019-01-02 DIAGNOSIS — Z79899 Other long term (current) drug therapy: Secondary | ICD-10-CM | POA: Diagnosis present

## 2019-01-02 DIAGNOSIS — M25562 Pain in left knee: Secondary | ICD-10-CM | POA: Diagnosis present

## 2019-01-02 DIAGNOSIS — G894 Chronic pain syndrome: Secondary | ICD-10-CM

## 2019-01-02 DIAGNOSIS — M545 Low back pain: Secondary | ICD-10-CM | POA: Insufficient documentation

## 2019-01-02 DIAGNOSIS — M150 Primary generalized (osteo)arthritis: Secondary | ICD-10-CM

## 2019-01-02 DIAGNOSIS — M159 Polyosteoarthritis, unspecified: Secondary | ICD-10-CM

## 2019-01-02 DIAGNOSIS — F1290 Cannabis use, unspecified, uncomplicated: Secondary | ICD-10-CM | POA: Diagnosis present

## 2019-01-02 DIAGNOSIS — G8929 Other chronic pain: Secondary | ICD-10-CM | POA: Insufficient documentation

## 2019-01-02 DIAGNOSIS — M8949 Other hypertrophic osteoarthropathy, multiple sites: Secondary | ICD-10-CM

## 2019-01-02 NOTE — Progress Notes (Signed)
Safety precautions to be maintained throughout the outpatient stay will include: orient to surroundings, keep bed in low position, maintain call bell within reach at all times, provide assistance with transfer out of bed and ambulation.  

## 2019-01-02 NOTE — Progress Notes (Signed)
Patient's Name: Abigail [name]  MRN: [id number]  Referring Provider: Aletha Halim., PA-C  DOB: 10-16-1965  PCP: Aletha Halim., PA-C  DOS: 01/02/2019  Note by: Gillis Santa, MD  Service setting: Ambulatory outpatient  Specialty: Interventional Pain Management  Location: [facility] (AMB) Pain Management Facility    Patient type: Established   Primary Reason(s) for Visit: Evaluation of chronic illnesses with exacerbation, or progression (Level of risk: moderate) CC: Knee Pain (left); Back Pain (entire back ); and Neck Pain  HPI  Abigail [name] is a 53 y.o. year old, female patient, who comes today for a follow-up evaluation. She has Intervertebral disc disorder with myelopathy, lumbar region; Spinal stenosis, lumbar; Spinal stenosis, lumbar region, with neurogenic claudication; Lumbar disc herniation with radiculopathy; Recurrent herniation of lumbar disc; Herniated lumbar intervertebral disc; TIA (transient ischemic attack); Hypertension; Decreased sensation of foot; Cervical radiculopathy; Adrenal insufficiency ([place]); Depression; Benign essential hypertension; Carpal tunnel syndrome, bilateral upper limbs; Chronic bilateral low back pain without sciatica; Chronic daily headache; Enlarged pituitary gland ([place]); Headache disorder; Insomnia; Lightheadedness; Mixed incontinence; Neck pain, chronic; Nicotine use disorder; Osteoarthritis of multiple joints; Pain of left hip joint; Pure hypercholesterolemia; Sleeping difficulty; Snoring; Weakness; Chronic pain of left knee (Primary Area of Pain); Chronic pain syndrome; Long term prescription benzodiazepine use; Opiate use; Pharmacologic therapy; Disorder of skeletal system; Problems influencing health status; and Marijuana use on their problem list. Abigail [name] was last seen on Visit date not found. Her primarily concern today is the Knee Pain (left); Back Pain (entire back ); and Neck Pain  Pain Assessment: Location:   (see visit info for pain sites  ) Radiating: neck pain into shoulders and arms.  Onset: More than a month ago Duration: Chronic pain Quality: Constant, Aching, Sharp, Throbbing, Discomfort Severity: 8 /10 (subjective, self-reported pain score)  Note: Reported level is inconsistent with clinical observations. Clinically the patient looks like a 3/10 A 3/10 is viewed as "Moderate" and described as significantly interfering with activities of daily living (ADL). It becomes difficult to feed, bathe, get dressed, get on and off the toilet or to perform personal hygiene functions. Difficult to get in and out of bed or a chair without assistance. Very distracting. With effort, it can be ignored when deeply involved in activities.       When using our objective Pain Scale, levels between 6 and 10/10 are said to belong in an emergency room, as it progressively worsens from a 6/10, described as severely limiting, requiring emergency care not usually available at an outpatient pain management facility. At a 6/10 level, communication becomes difficult and requires great effort. Assistance to reach the emergency department may be required. Facial flushing and profuse sweating along with potentially dangerous increases in heart rate and blood pressure will be evident. Effect on ADL: unable to perform ADL's, houshehold chores  Timing: Constant Modifying factors: nothing currently  BP: 112/86  HR: 88  Further details on both, my assessment(s), as well as the proposed treatment plan, please see below.  Patient was previously seen by nurse practitioner, Dionisio David on 12/26/2018.  Patient requested to see me after her visit with Dionisio David.  She follows up today with a chief complaint of diffuse back pain along her cervical, thoracic and lumbar spine along with left knee pain.  Patient does have a history of lumbar spine surgery which includes bilateral laminectomies at L5-S1.  Patient states that she has undergone a total of 3 lumbar spine  surgeries.  Patient has tried  epidural injections in the past which were only beneficial for short period of time.  She has engaged with physical therapy in the past as well.  Patient does have a history of adrenal insufficiency secondary to chronic long-term steroid use.  Her previous providers have told her to avoid steroid medications if at all possible.  Patient also has a history of anxiety and depression and is currently on Xanax 0.5 mg 4 times a day as needed.  Patient also engages in daily and regular marijuana use.  She has been referred here from her neurosurgeon, Dr. Cari Caraway to discuss interventional options for her back pain.  Patient also endorses isolated left knee pain.  This is worse with weightbearing.  States that she does have instability at times related to pain.  MRI of left knee shows patellofemoral arthritis without any meniscal or ligamentous tear.  Patient does have a history of cervical spine fusion, C2-C5 ACDF.  Laboratory Chemistry  Inflammation Markers (CRP: Acute Phase) (ESR: Chronic Phase) Lab Results  Component Value Date   CRP 1 12/26/2018   ESRSEDRATE 11 12/26/2018                         Rheumatology Markers Lab Results  Component Value Date   RF <10 08/29/2013   ANA NEG 08/29/2013                        Renal Function Markers Lab Results  Component Value Date   BUN 12 12/26/2018   CREATININE 0.87 12/26/2018   BCR 14 12/26/2018   GFRAA 90 12/26/2018   GFRNONAA 78 12/26/2018                             Hepatic Function Markers Lab Results  Component Value Date   AST 13 12/26/2018   ALT 7 06/09/2014   ALBUMIN 4.4 12/26/2018   ALKPHOS 59 12/26/2018   LIPASE 91 03/21/2013                        Electrolytes Lab Results  Component Value Date   NA 139 12/26/2018   K 5.0 12/26/2018   CL 99 12/26/2018   CALCIUM 9.3 12/26/2018   MG 2.1 12/26/2018                        Neuropathy Markers Lab Results  Component Value Date   CRFVOHKG67  703 12/26/2018                        CNS Tests No results found for: COLORCSF, APPEARCSF, RBCCOUNTCSF, WBCCSF, POLYSCSF, LYMPHSCSF, EOSCSF, PROTEINCSF, GLUCCSF, JCVIRUS, CSFOLI, IGGCSF                      Bone Pathology Markers Lab Results  Component Value Date   25OHVITD1 48 12/26/2018   25OHVITD2 2.7 12/26/2018   25OHVITD3 45 12/26/2018                         Coagulation Parameters Lab Results  Component Value Date   INR 0.96 10/06/2018   LABPROT 12.7 10/06/2018   APTT 35 10/06/2018   PLT 291 10/06/2018  Cardiovascular Markers Lab Results  Component Value Date   CKTOTAL 102 03/21/2013   TROPONINI <0.03 02/01/2015   HGB 14.7 10/06/2018   HCT 45.6 10/06/2018                         CA Markers No results found for: CEA, CA125, LABCA2                      Endocrine Markers Lab Results  Component Value Date   TSH 2.317 09/08/2017                        Note: Lab results reviewed.  Recent Diagnostic Imaging Review  Cervical Imaging: Cervical MR wo contrast:  Results for orders placed during the hospital encounter of 01/24/15  MR Cervical Spine Wo Contrast   Narrative CLINICAL DATA:  53 year old female with widespread pain with tingling and numbness. Right greater than left cervical neck pain. No known injury. Initial encounter.  EXAM: MRI CERVICAL SPINE WITHOUT CONTRAST  TECHNIQUE: Multiplanar, multisequence MR imaging of the cervical spine was performed. No intravenous contrast was administered.  COMPARISON:  Cervical spine radiographs 06/06/2014. Brain MRI 06/03/2014. [facility] 08/04/2013. Cervical spine MRI  FINDINGS: Chronic straightening of cervical lordosis. Chronic mild anterolisthesis of C2 on C3 is stable since 2014. Mild anterolisthesis of C3 on C4, and also trace anterolisthesis of C4 on C5 and C6 on C7 also appears stable. At the same time however there is mild increased STIR signal in the  chronically degenerated right C3-C4 facets (series 4, image 3) and associated fluid in that facet joint. This appears to be degenerative in nature. See additional details below.  No other acute osseous abnormality. Cervicomedullary junction is within normal limits. Spinal cord signal is within normal limits at all visualized levels.  Negative paraspinal soft tissues.  C2-C3: Chronic moderate facet hypertrophy on the right. No significant stenosis.  C3-C4: Chronic anterolisthesis with right eccentric disc bulge. A chronic moderate facet hypertrophy greater on the right. New right facet joint fluid. No significant spinal stenosis. Mild right C4 foraminal stenosis has not significantly changed.  C4-C5: Chronic right eccentric disc bulge and up to moderate facet hypertrophy. Mild uncovertebral hypertrophy on the right. No significant stenosis.  C5-C6: Minimal disc bulge and left uncovertebral hypertrophy. No stenosis.  C6-C7: Mild to moderate facet hypertrophy greater on the right. No stenosis.  C7-T1:  Mild facet hypertrophy.  No stenosis.  No upper thoracic spinal or foraminal stenosis.  IMPRESSION: 1. Dominant finding in the cervical spine is multilevel chronic mild spondylolisthesis with associated facet arthropathy at each affected level. 2. Mild inflammation of the chronically degenerated right C3-C4 facet is most compatible with an acute exacerbation of the chronic facet joint arthritis. 3. Comparatively mild cervical disc degeneration and no cervical spinal stenosis.   Electronically Signed   By: Genevie Ann M.D.   On: 01/24/2015 22:25     Results for orders placed during the hospital encounter of 10/18/18  DG Cervical Spine 2 or 3 views   Narrative CLINICAL DATA:  Cervical spine fusion.  EXAM: CERVICAL SPINE - 2-3 VIEW  COMPARISON:  10/18/2018.  FINDINGS: C2 through C5 anterior interbody fusion. Hardware intact. Anatomic alignment. No acute bony  abnormality. Diffuse degenerative change. Biapical pleural thickening consistent scarring.  IMPRESSION: Through C5 anterior interbody fusion.  Hardware intact.   Electronically Signed   By: Marcello Moores  Register  On: 10/19/2018 07:27   DG Cervical Spine 2-3 Views   Narrative CLINICAL DATA:  Cervical fusion.  EXAM: CERVICAL SPINE - 2-3 VIEW; DG C-ARM 61-120 MIN  COMPARISON:  None.  FINDINGS: Four spot fluoroscopic images of the cervical spine are submitted from the operating room. Initial image demonstrates anterior localization of the C2-3 disc space. Subsequent images demonstrate anterior discectomy and fusion from C2 through C5 using an anterior plate, screws and intervertebral bone plugs. On the final lateral image, there is a slight anterolisthesis at C4-5. Hardware appears well positioned.  IMPRESSION: Intraoperative views during C2-5 ACDF. Slight anterolisthesis at C4-5 on final lateral view. Standard radiographic evaluation recommended.   Electronically Signed   By: Richardean Sale M.D.   On: 10/18/2018 14:41     Results for orders placed during the hospital encounter of 06/06/14  DG Cervical Spine Complete   Narrative CLINICAL DATA:  Right-sided neck pain radiating to the shoulder arm for several weeks. Syncope with fall tonight. Increasing neck pain.  EXAM: CERVICAL SPINE  4+ VIEWS  COMPARISON:  MRI cervical spine 08/04/2013  FINDINGS: Slight anterior subluxation of C3 on C4 was present on previous MRI. Otherwise normal alignment of the cervical spine and facet joints. Degenerative changes throughout the facet joints. C1-2 articulation appears intact. Intervertebral disc space heights are preserved. No vertebral compression deformities. No prevertebral soft tissue swelling. No focal bone lesion or bone destruction.  IMPRESSION: Slight anterior subluxation of C3 on C4 has been present on previous studies and is likely degenerative. Degenerative changes  throughout the facet joints. No acute changes demonstrated.   Electronically Signed   By: Lucienne Capers M.D.   On: 06/06/2014 23:51    Lumbosacral Imaging: Lumbar MR wo contrast:  Results for orders placed during the hospital encounter of 12/10/18  MR LUMBAR SPINE WO CONTRAST   Narrative CLINICAL DATA:  Progressively worsening chronic low back pain. Prior lumbar surgeries.  EXAM: MRI LUMBAR SPINE WITHOUT CONTRAST  TECHNIQUE: Multiplanar, multisequence MR imaging of the lumbar spine was performed. No intravenous contrast was administered.  COMPARISON:  09/24/2013  FINDINGS: Segmentation:  Standard.  Alignment:  Unchanged grade 1 retrolisthesis of L5 on S1.  Vertebrae: No fracture, suspicious osseous lesion, or significant marrow edema. Progressive chronic degenerative marrow changes at L5-S1. Small superior endplate Schmorl's nodes at T12, L1, and L3, unchanged. Small Tarlov cysts at S2 and S3.  Conus medullaris and cauda equina: Conus extends to the L1-2 level. Conus and cauda equina appear normal.  Paraspinal and other soft tissues: Unchanged 1 cm T2 hyperintense lesion in the left kidney, likely a cyst. Postoperative changes in the posterior lower lumbar soft tissues. No fluid collection.  Disc levels:  Disc desiccation throughout most of the lumbar and lower thoracic spine, greatest at L4-5 and L5-S1 where there is mild and severe disc space narrowing, respectively.  T12-L1 through L3-4: Negative.  L4-5: Mild disc bulging and mild facet and ligamentum flavum hypertrophy, at most slightly progressed from the prior study. No significant stenosis.  L5-S1: Bilateral laminectomies, new on the left since the prior study. Epidural fibrosis is most notable about the right S1 nerve root. No recurrent disc herniation or spinal or lateral recess stenosis is identified. Disc bulging, endplate spurring, severe disc space height loss, and moderate facet hypertrophy  result in moderate bilateral neural foraminal stenosis, not significantly changed.  IMPRESSION: 1. Postsurgical changes and progressive disc degeneration at L5-S1 without evidence of recurrent disc herniation or spinal stenosis. Unchanged moderate bilateral neural  foraminal stenosis. 2. Mild disc bulging and posterior element hypertrophy at L4-5 without stenosis.   Electronically Signed   By: Logan Bores M.D.   On: 12/10/2018 13:45     Lumbar DG 2-3 views:  Results for orders placed during the hospital encounter of 09/27/13  DG Lumbar Spine 2-3 Views   Narrative CLINICAL DATA:  Lumbar disc protrusion.  EXAM: LUMBAR SPINE - 2-3 VIEW  COMPARISON:  MRI dated 09/24/2013  FINDINGS: There is marked narrowing of the L5-S1 disc space. The remainder of the lumbar spine is normal.  IMPRESSION: Degenerative disc disease at L5-S1. Vertebra are labeled for intraoperative comparison.   Electronically Signed   By: Rozetta Nunnery M.D.   On: 09/27/2013 14:14            Hip-R MR wo contrast:  Results for orders placed during the hospital encounter of 06/20/18  MR HIP RIGHT WO CONTRAST   Narrative CLINICAL DATA:  Chronic bilateral hip pain.  EXAM: MR OF THE LEFT HIP WITHOUT CONTRAST  MR OF THE RIGHT HIP WITHOUT CONTRAST  TECHNIQUE: Multiplanar, multisequence MR imaging was performed. No intravenous contrast was administered.  COMPARISON:  CT abdomen pelvis dated March 21, 2013.  FINDINGS: Bones: There is no evidence of acute fracture, dislocation or avascular necrosis. The visualized bony pelvis appears normal. The visualized sacroiliac joints and symphysis pubis appear normal. Degenerative disc disease at L5-S1.  Articular cartilage and labrum  Articular cartilage: No focal chondral defect or subchondral signal abnormality identified.  Labrum: Partial tear of the left anterior superior labrum. The right labrum is intact.  Joint or bursal effusion  Joint  effusion: No significant hip joint effusion.  Bursae: No focal periarticular fluid collection.  Muscles and tendons  Muscles and tendons: The visualized gluteus, hamstring and iliopsoas tendons appear normal. No muscle edema or atrophy.  Other findings  Miscellaneous: Prior hysterectomy. The visualized internal pelvic contents otherwise appear unremarkable.  IMPRESSION: 1. Partial tear of the left anterior superior labrum. 2. No acute osseous abnormality or significant degenerative changes.   Electronically Signed   By: Titus Dubin M.D.   On: 06/20/2018 16:25    Hip-L MR wo contrast:  Results for orders placed during the hospital encounter of 06/20/18  MR HIP LEFT WO CONTRAST   Narrative CLINICAL DATA:  Chronic bilateral hip pain.  EXAM: MR OF THE LEFT HIP WITHOUT CONTRAST  MR OF THE RIGHT HIP WITHOUT CONTRAST  TECHNIQUE: Multiplanar, multisequence MR imaging was performed. No intravenous contrast was administered.  COMPARISON:  CT abdomen pelvis dated March 21, 2013.  FINDINGS: Bones: There is no evidence of acute fracture, dislocation or avascular necrosis. The visualized bony pelvis appears normal. The visualized sacroiliac joints and symphysis pubis appear normal. Degenerative disc disease at L5-S1.  Articular cartilage and labrum  Articular cartilage: No focal chondral defect or subchondral signal abnormality identified.  Labrum: Partial tear of the left anterior superior labrum. The right labrum is intact.  Joint or bursal effusion  Joint effusion: No significant hip joint effusion.  Bursae: No focal periarticular fluid collection.  Muscles and tendons  Muscles and tendons: The visualized gluteus, hamstring and iliopsoas tendons appear normal. No muscle edema or atrophy.  Other findings  Miscellaneous: Prior hysterectomy. The visualized internal pelvic contents otherwise appear unremarkable.  IMPRESSION: 1. Partial tear of the left  anterior superior labrum. 2. No acute osseous abnormality or significant degenerative changes.   Electronically Signed   By: Titus Dubin M.D.   On: 06/20/2018  16:25     Knee-L MR w contrast:  Results for orders placed during the hospital encounter of 12/07/18  MR KNEE LEFT WO CONTRAST   Narrative CLINICAL DATA:  Diffuse left knee pain and locking for 1 year.  EXAM: MRI OF THE LEFT KNEE WITHOUT CONTRAST  TECHNIQUE: Multiplanar, multisequence MR imaging of the knee was performed. No intravenous contrast was administered.  COMPARISON:  None.  FINDINGS: MENISCI  Medial meniscus:  Intact.  Lateral meniscus:  Intact.  LIGAMENTS  Cruciates:  Intact.  Collaterals:  Intact.  CARTILAGE  Patellofemoral: Fissuring and irregularity are seen about both the medial and lateral patellar facets, worst in the upper pole of the medial facet and mid to lower pole of the lateral facet.  Medial:  Normal.  Lateral:  Normal.  Joint:  Trace amount of joint fluid.  Popliteal Fossa:  No Baker's cyst.  Extensor Mechanism:  Intact.  Bones: Scattered small subchondral cysts and mild edema are seen in the patella at sites of cartilage loss.  Other: None.  IMPRESSION: Negative for meniscal or ligament tear.  Moderate appearing patellofemoral osteoarthritis.   Electronically Signed   By: Inge Rise M.D.   On: 12/07/2018 15:15    Complexity Note: Imaging results reviewed. Results shared with Abigail [name], using Layman's terms.                         Meds   Current Outpatient Medications:  .  acetaminophen (TYLENOL) 500 MG tablet, Take 500 mg by mouth every 6 (six) hours as needed., Disp: , Rfl:  .  ALPRAZolam (XANAX) 0.5 MG tablet, Take 0.5 mg by mouth 2 (two) times daily as needed for anxiety. , Disp: , Rfl:  .  amitriptyline (ELAVIL) 100 MG tablet, Take 100 mg by mouth at bedtime., Disp: , Rfl:  .  amLODipine-olmesartan (AZOR) 5-20 MG per tablet, Take 1 tablet  by mouth daily before breakfast., Disp: , Rfl:  .  atorvastatin (LIPITOR) 20 MG tablet, Take 20 mg by mouth daily., Disp: , Rfl:  .  celecoxib (CELEBREX) 200 MG capsule, Take 200 mg by mouth 2 (two) times daily., Disp: , Rfl:  .  Cholecalciferol (VITAMIN D-1000 MAX ST) 25 MCG (1000 UT) tablet, Take 1,000 Units by mouth daily., Disp: , Rfl:  .  DULoxetine (CYMBALTA) 60 MG capsule, Take 60 mg by mouth 2 (two) times daily., Disp: , Rfl:  .  fluticasone (FLONASE) 50 MCG/ACT nasal spray, Place 2 sprays into both nostrils 2 (two) times daily., Disp: , Rfl:  .  Fremanezumab-vfrm (AJOVY) 225 MG/1.5ML SOSY, Inject 225 mg into the skin every 28 (twenty-eight) days., Disp: , Rfl:  .  hydrocortisone (CORTEF) 5 MG tablet, Take 5-10 mg by mouth See admin instructions. Take 10m by mouth in the morning and 552min the evening, Disp: , Rfl:  .  methocarbamol (ROBAXIN) 500 MG tablet, Take 2 tablets (1,000 mg total) by mouth 4 (four) times daily., Disp: 90 tablet, Rfl: 0 .  metoprolol succinate (TOPROL-XL) 50 MG 24 hr tablet, Take 50 mg by mouth daily. Take with or immediately following a meal., Disp: , Rfl:  .  pregabalin (LYRICA) 75 MG capsule, Take 75 mg by mouth 3 (three) times daily., Disp: , Rfl:  .  tiZANidine (ZANAFLEX) 2 MG tablet, Take by mouth 3 (three) times daily., Disp: , Rfl:   ROS  Constitutional: Denies any fever or chills Gastrointestinal: No reported hemesis, hematochezia, vomiting, or acute GI  distress Musculoskeletal: Denies any acute onset joint swelling, redness, loss of ROM, or weakness Neurological: No reported episodes of acute onset apraxia, aphasia, dysarthria, agnosia, amnesia, paralysis, loss of coordination, or loss of consciousness  Allergies  Abigail [name] has No Known Allergies.  [place]  Drug: Abigail [name]  reports current drug use. Frequency: 7.00 times per week. Drug: Marijuana. Alcohol:  reports current alcohol use. Tobacco:  reports that she has been smoking cigarettes. She  has a 10.50 pack-year smoking history. She has never used smokeless tobacco. Medical:  has a past medical history of Adrenal cortex insufficiency ([place]), Arthritis, Complication of anesthesia, Headache(784.0), Hypertension, and Syncope. Surgical: Abigail [name]  has a past surgical history that includes left foot bone spur surgery (10 yrs ago); Abdominal hysterectomy (14 yrs ago); Lumbar laminectomy/decompression microdiscectomy (07/13/2012); Lumbar laminectomy/decompression microdiscectomy (Right, 03/07/2013); Hemi-microdiscectomy lumbar laminectomy level 1 (N/A, 09/28/2013); Breast biopsy (Left, 04/10/2015); Cholecystectomy; Eye surgery; Back surgery; and Anterior cervical decompression/discectomy fusion 4 level (N/A, 10/18/2018). Family: family history is not on file.  Constitutional Exam  General appearance: Well nourished, well developed, and well hydrated. In no apparent acute distress Vitals:   01/02/19 1131  BP: 112/86  Pulse: 88  Resp: 16  Temp: 98.3 F (36.8 C)  TempSrc: Oral  SpO2: 97%  Weight: 164 lb (74.4 kg)  Height: 5' 5"  (1.651 m)   BMI Assessment: Estimated body mass index is 27.29 kg/m as calculated from the following:   Height as of this encounter: 5' 5"  (1.651 m).   Weight as of this encounter: 164 lb (74.4 kg).  BMI interpretation table: BMI level Category Range association with higher incidence of chronic pain  <18 kg/m2 Underweight   18.5-24.9 kg/m2 Ideal body weight   25-29.9 kg/m2 Overweight Increased incidence by 20%  30-34.9 kg/m2 Obese (Class I) Increased incidence by 68%  35-39.9 kg/m2 Severe obesity (Class II) Increased incidence by 136%  >40 kg/m2 Extreme obesity (Class III) Increased incidence by 254%   Patient's current BMI Ideal Body weight  Body mass index is 27.29 kg/m. Ideal body weight: 57 kg (125 lb 10.6 oz) Adjusted ideal body weight: 64 kg (141 lb)   BMI Readings from Last 4 Encounters:  01/02/19 27.29 kg/m  12/26/18 27.29 kg/m  12/10/18  27.79 kg/m  10/18/18 28.29 kg/m   Wt Readings from Last 4 Encounters:  01/02/19 164 lb (74.4 kg)  12/26/18 164 lb (74.4 kg)  12/10/18 167 lb (75.8 kg)  10/18/18 170 lb (77.1 kg)  Psych/Mental status: Alert, oriented x 3 (person, place, & time)       Eyes: PERLA Respiratory: No evidence of acute respiratory distress  Cervical Spine Area Exam  Skin & Axial Inspection: Well healed scar from previous spine surgery detected Alignment: Symmetrical Functional ROM: Decreased ROM, bilaterally Stability: No instability detected Muscle Tone/Strength: Functionally intact. No obvious neuro-muscular anomalies detected. Sensory (Neurological): Musculoskeletal pain pattern Palpation: No palpable anomalies              Upper Extremity (UE) Exam    Side: Right upper extremity  Side: Left upper extremity  Skin & Extremity Inspection: Skin color, temperature, and hair growth are WNL. No peripheral edema or cyanosis. No masses, redness, swelling, asymmetry, or associated skin lesions. No contractures.  Skin & Extremity Inspection: Skin color, temperature, and hair growth are WNL. No peripheral edema or cyanosis. No masses, redness, swelling, asymmetry, or associated skin lesions. No contractures.  Functional ROM: Unrestricted ROM          Functional  ROM: Unrestricted ROM          Muscle Tone/Strength: Functionally intact. No obvious neuro-muscular anomalies detected.  Muscle Tone/Strength: Functionally intact. No obvious neuro-muscular anomalies detected.  Sensory (Neurological): Unimpaired          Sensory (Neurological): Unimpaired          Palpation: No palpable anomalies              Palpation: No palpable anomalies              Provocative Test(s):  Phalen's test: deferred Tinel's test: deferred Apley's scratch test (touch opposite shoulder):  Action 1 (Across chest): deferred Action 2 (Overhead): deferred Action 3 (LB reach): deferred   Provocative Test(s):  Phalen's test: deferred Tinel's  test: deferred Apley's scratch test (touch opposite shoulder):  Action 1 (Across chest): deferred Action 2 (Overhead): deferred Action 3 (LB reach): deferred    Thoracic Spine Area Exam  Skin & Axial Inspection: No masses, redness, or swelling Alignment: Symmetrical Functional ROM: Unrestricted ROM Stability: No instability detected Muscle Tone/Strength: Functionally intact. No obvious neuro-muscular anomalies detected. Sensory (Neurological): Musculoskeletal pain pattern Muscle strength & Tone: No palpable anomalies  Lumbar Spine Area Exam  Skin & Axial Inspection: Well healed scar from previous spine surgery detected Alignment: Symmetrical Functional ROM: Pain restricted ROM affecting both sides Stability: No instability detected Muscle Tone/Strength: Functionally intact. No obvious neuro-muscular anomalies detected. Sensory (Neurological): Articular pain pattern and musculoskeletal Palpation: No palpable anomalies       Provocative Tests: Hyperextension/rotation test: (+) due to pain. Lumbar quadrant test (Kemp's test): (+) bilaterally for facet joint pain. Lateral bending test: (+) due to pain. Patrick's Maneuver: deferred today                   FABER* test: deferred today                   S-I anterior distraction/compression test: deferred today         S-I lateral compression test: deferred today         S-I Thigh-thrust test: deferred today         S-I Gaenslen's test: deferred today         *(Flexion, ABduction and External Rotation)  Gait & Posture Assessment  Ambulation: Unassisted Gait: Relatively normal for age and body habitus Posture: WNL   Lower Extremity Exam    Side: Right lower extremity  Side: Left lower extremity  Stability: No instability observed          Stability: No instability observed          Skin & Extremity Inspection: Skin color, temperature, and hair growth are WNL. No peripheral edema or cyanosis. No masses, redness, swelling, asymmetry,  or associated skin lesions. No contractures.  Skin & Extremity Inspection: Skin color, temperature, and hair growth are WNL. No peripheral edema or cyanosis. No masses, redness, swelling, asymmetry, or associated skin lesions. No contractures.  Functional ROM: Unrestricted ROM                  Functional ROM: Decreased ROM for hip and knee joints          Muscle Tone/Strength: Functionally intact. No obvious neuro-muscular anomalies detected.  Muscle Tone/Strength: Functionally intact. No obvious neuro-muscular anomalies detected.  Sensory (Neurological): Unimpaired        Sensory (Neurological): Arthropathic arthralgia        DTR: Patellar: 1+: trace Achilles: deferred today Plantar: deferred today  DTR: Patellar: 0: absent Achilles: deferred today Plantar: deferred today  Palpation: No palpable anomalies  Palpation: No palpable anomalies   Assessment   Status Diagnosis  Having a Flare-up Having a Flare-up Persistent 1. Chronic pain of left knee   2. Primary osteoarthritis of left knee   3. Chronic pain syndrome   4. Marijuana use   5. Long term prescription benzodiazepine use   6. Chronic bilateral low back pain without sciatica   7. Primary osteoarthritis involving multiple joints   8. Adrenal insufficiency ([place])     General Recommendations: The pain condition that the patient suffers from is best treated with a multidisciplinary approach that involves an increase in physical activity to prevent de-conditioning and worsening of the pain cycle, as well as psychological counseling (formal and/or informal) to address the co-morbid psychological affects of pain. Treatment will often involve judicious use of pain medications and interventional procedures to decrease the pain, allowing the patient to participate in the physical activity that will ultimately produce long-lasting pain reductions. The goal of the multidisciplinary approach is to return the patient to a higher level of overall  function and to restore their ability to perform activities of daily living.  53 year old female with a list of medical conditions detailed above who presents with diffuse and widespread back pain in her cervical, thoracic and lumbar spine as well as isolated left knee pain.  Patient has a history of cervical C2-C5 ACDF as well as history of 3 prior lumbar spine surgeries including bilateral lumbar laminectomies.  Patient does not describe any radiating pain in her legs.  The majority of her pain is in her upper back, mid to low back.  She describes this as a pressure-like sensation.  She does have difficulty ambulating long distances.  Patient has a history of adrenal insufficiency and has been told by previous providers to avoid steroid medications since she has been on these medications in the past at high doses which has contributed to her adrenal insufficiency.  Patient also has a history of anxiety and depression, she is on high-dose Xanax.  Patient also utilizes marijuana on a daily basis.  I did express concerns regarding her medications as some of them can be sedating including Robaxin, Xanax, Cymbalta, Lyrica and tizanidine.  In regards to treatment options, discussed lumbar epidural steroid injection however the patient wants to avoid steroid therapy which is reasonable related to her adrenal insufficiency.  An instance of discussion with the patient about spinal cord stimulation.  Majority patient's pain is axial low back without radiation to lower extremities.  I informed her that spinal cord stimulation is more effective for appendicular rather than axial pain.  Furthermore patient will likely not pass psych evaluation for SCS trial given daily use of marijuana as well as high-dose Xanax therapy.  In regards to the patient's left knee pain related to patellofemoral arthritis without any meniscus or ligamentous injury, we discussed diagnostic left genicular nerve block with possible cooled  radiofrequency ablation thereafter.  Risk and benefits of this procedure were discussed and patient would like to proceed.  Plan: -Interventional pain management only continue medication management with primary care provider -Left genicular nerve block possible RFA thereafter.  Future considerations: Cervical facet arthropathy, cervical generative disease: Diagnostic cervical facet medial branch nerve blocks Myofascial pain syndrome: Trigger point injections, cervical/trapezius Low back pain related to lumbar facet arthropathy: Diagnostic lumbar facet medial branch nerve blocks Left knee pain: Diagnostic left knee genicular nerve block followed possibly by  left genicular RFA.  Can also consider intra-articular Hyalgan.   Lab-work, procedure(s), and/or referral(s): Orders Placed This Encounter  Procedures  . GENICULAR NERVE BLOCK    Provider-requested follow-up: For procedure after  CoVid  Time Note: Greater than 50% of the 25 minute(s) of face-to-face time spent with Abigail [name], was spent in counseling/coordination of care regarding: the appropriate use of the pain scale, Abigail [name]'s primary cause of pain, the treatment plan, treatment alternatives, the risks and possible complications of proposed treatment, going over the informed consent and realistic expectations.  Primary Care Physician: Aletha Halim., PA-C Location: [facility] Note by: Gillis Santa, M.D Date: 01/02/2019; Time: 1:27 PM  Patient Instructions  Preparing for your procedure (without sedation) Instructions: . Oral Intake: Do not eat or drink anything for at least 3 hours prior to your procedure. . Transportation: Unless otherwise stated by your physician, you may drive yourself after the procedure. . Blood Pressure Medicine: Take your blood pressure medicine with a sip of water the morning of the procedure. . Insulin: Take only  of your normal insulin dose. . Preventing  infections: Shower with an antibacterial soap the morning of your procedure. . Build-up your immune system: Take 1000 mg of Vitamin C with every meal (3 times a day) the day prior to your procedure. . Pregnancy: If you are pregnant, call and cancel the procedure. . Sickness: If you have a cold, fever, or any active infections, call and cancel the procedure. . Arrival: You must be in the facility at least 30 minutes prior to your scheduled procedure. . Children: Do not bring any children with you. . Dress appropriately: Bring dark clothing that you would not mind if they get stained. . Valuables: Do not bring any jewelry or valuables. Procedure appointments are reserved for interventional treatments only. Marland Kitchen No Prescription Refills. . No medication changes will be discussed during procedure appointments. . No disability issues will be discussed.

## 2019-01-02 NOTE — Patient Instructions (Signed)

## 2019-01-22 ENCOUNTER — Ambulatory Visit: Payer: Medicare Other | Admitting: Pain Medicine

## 2019-03-13 ENCOUNTER — Telehealth: Payer: Self-pay | Admitting: *Deleted

## 2019-03-13 DIAGNOSIS — G8929 Other chronic pain: Secondary | ICD-10-CM

## 2019-03-13 DIAGNOSIS — M1712 Unilateral primary osteoarthritis, left knee: Secondary | ICD-10-CM

## 2019-03-13 NOTE — Telephone Encounter (Signed)
There was an order from her last visit to have genicular NB after COVID. I don't see the order now. Expired?

## 2019-03-16 ENCOUNTER — Other Ambulatory Visit
Admission: RE | Admit: 2019-03-16 | Discharge: 2019-03-16 | Disposition: A | Discharge status: Home / Self Care | Payer: 59 | Source: Ambulatory Visit | Attending: Student in an Organized Health Care Education/Training Program | Admitting: Student in an Organized Health Care Education/Training Program

## 2019-03-16 ENCOUNTER — Other Ambulatory Visit: Payer: Self-pay

## 2019-03-16 DIAGNOSIS — Z01812 Encounter for preprocedural laboratory examination: Secondary | ICD-10-CM | POA: Insufficient documentation

## 2019-03-16 DIAGNOSIS — Z1159 Encounter for screening for other viral diseases: Secondary | ICD-10-CM | POA: Insufficient documentation

## 2019-03-17 LAB — NOVEL CORONAVIRUS, NAA (HOSP ORDER, SEND-OUT TO REF LAB; TAT 18-24 HRS): SARS-CoV-2, NAA: NOT DETECTED

## 2019-03-21 ENCOUNTER — Other Ambulatory Visit: Payer: Self-pay

## 2019-03-21 ENCOUNTER — Encounter: Payer: Self-pay | Admitting: Student in an Organized Health Care Education/Training Program

## 2019-03-21 ENCOUNTER — Ambulatory Visit (HOSPITAL_BASED_OUTPATIENT_CLINIC_OR_DEPARTMENT_OTHER): Payer: 59 | Admitting: Student in an Organized Health Care Education/Training Program

## 2019-03-21 ENCOUNTER — Ambulatory Visit
Admission: RE | Admit: 2019-03-21 | Discharge: 2019-03-21 | Disposition: A | Discharge status: Home / Self Care | Payer: 59 | Source: Ambulatory Visit | Attending: Student in an Organized Health Care Education/Training Program | Admitting: Student in an Organized Health Care Education/Training Program

## 2019-03-21 DIAGNOSIS — M25562 Pain in left knee: Secondary | ICD-10-CM

## 2019-03-21 DIAGNOSIS — M1712 Unilateral primary osteoarthritis, left knee: Secondary | ICD-10-CM

## 2019-03-21 DIAGNOSIS — G8929 Other chronic pain: Secondary | ICD-10-CM | POA: Diagnosis present

## 2019-03-21 MED ORDER — ROPIVACAINE HCL 2 MG/ML IJ SOLN
INTRAMUSCULAR | Status: AC
  Filled 2019-03-21: qty 10

## 2019-03-21 MED ORDER — DEXAMETHASONE SODIUM PHOSPHATE 10 MG/ML IJ SOLN
INTRAMUSCULAR | Status: AC
  Filled 2019-03-21: qty 1

## 2019-03-21 MED ORDER — ROPIVACAINE HCL 2 MG/ML IJ SOLN
1.0000 mL | Freq: Once | INTRAMUSCULAR | Status: AC
  Administered 2019-03-21: 10 mL via EPIDURAL

## 2019-03-21 MED ORDER — DEXAMETHASONE SODIUM PHOSPHATE 10 MG/ML IJ SOLN
10.0000 mg | Freq: Once | INTRAMUSCULAR | Status: AC
  Administered 2019-03-21: 10 mg

## 2019-03-21 MED ORDER — LIDOCAINE HCL 2 % IJ SOLN
20.0000 mL | Freq: Once | INTRAMUSCULAR | Status: AC
  Administered 2019-03-21: 400 mg

## 2019-03-21 MED ORDER — LIDOCAINE HCL 2 % IJ SOLN
INTRAMUSCULAR | Status: AC
  Filled 2019-03-21: qty 20

## 2019-03-21 NOTE — Progress Notes (Signed)
Safety precautions to be maintained throughout the outpatient stay will include: orient to surroundings, keep bed in low position, maintain call bell within reach at all times, provide assistance with transfer out of bed and ambulation.  

## 2019-03-21 NOTE — Progress Notes (Signed)
Patient's Name: Abigail [name]  MRN: [id number]  Referring Provider: Gillis Santa, MD  DOB: [birth year]  PCP: Aletha Halim., PA-C  DOS: 03/21/2019  Note by: Gillis Santa, MD  Service setting: Ambulatory outpatient  Specialty: Interventional Pain Management  Patient type: Established  Location: [facility] (AMB) Pain Management Facility  Visit type: Interventional Procedure   Primary Reason for Visit: Interventional Pain Management Treatment. CC: Knee Pain (left)  Procedure:          Anesthesia, Analgesia, Anxiolysis:  Type: Genicular Nerves Block (Superior-lateral, Superior-medial, and Inferior-medial Genicular Nerves) #1  CPT: 10932      Primary Purpose: Diagnostic Region: Lateral, Anterior, and Medial aspects of the knee joint, above and below the knee joint proper. Level: Superior and inferior to the knee joint. Target Area: For Genicular Nerve block(s), the targets are: the superior-lateral genicular nerve, located in the lateral distal portion of the femoral shaft as it curves to form the lateral epicondyle, in the region of the distal femoral metaphysis; the superior-medial genicular nerve, located in the medial distal portion of the femoral shaft as it curves to form the medial epicondyle; and the inferior-medial genicular nerve, located in the medial, proximal portion of the tibial shaft, as it curves to form the medial epicondyle, in the region of the proximal tibial metaphysis. Approach: Anterior, percutaneous, ipsilateral approach. Laterality: Left knee  Type: Local Anesthesia  Local Anesthetic: Lidocaine 1-2%  Position: Modified Fowler's position with pillows under the targeted knee(s).   Indications: 1. Chronic pain of left knee   2. Primary osteoarthritis of left knee    Pain Score: Pre-procedure: 8 /10 Post-procedure: 0-No pain/10  Pre-op Assessment:  Abigail [name] is a 53 y.o. (year old), female patient, seen today for interventional treatment. She  has a past surgical  history that includes left foot bone spur surgery (10 yrs ago); Abdominal hysterectomy (14 yrs ago); Lumbar laminectomy/decompression microdiscectomy (07/13/2012); Lumbar laminectomy/decompression microdiscectomy (Right, 03/07/2013); Hemi-microdiscectomy lumbar laminectomy level 1 (N/A, 09/28/2013); Breast biopsy (Left, 04/10/2015); Cholecystectomy; Eye surgery; Back surgery; and Anterior cervical decompression/discectomy fusion 4 level (N/A, 10/18/2018). Abigail [name] has a current medication list which includes the following prescription(s): acetaminophen, alprazolam, amitriptyline, amlodipine-olmesartan, atorvastatin, celecoxib, cholecalciferol, duloxetine, fluticasone, fremanezumab-vfrm, methocarbamol, metoprolol succinate, prednisone, pregabalin, tizanidine, and hydrocortisone. Her primarily concern today is the Knee Pain (left)  Initial Vital Signs:  Pulse/HCG Rate: 83ECG Heart Rate: 72 Temp: 98.3 F (36.8 C) Resp: 16 BP: 112/71 SpO2: 99 %  BMI: Estimated body mass index is 27.46 kg/m as calculated from the following:   Height as of this encounter: 5\' 5"  (1.651 m).   Weight as of this encounter: 165 lb (74.8 kg).  Risk Assessment: Allergies: Reviewed. She has No Known Allergies.  Allergy Precautions: None required Coagulopathies: Reviewed. None identified.  Blood-thinner therapy: None at this time Active Infection(s): Reviewed. None identified. Abigail [name] is afebrile  Site Confirmation: Abigail [name] was asked to confirm the procedure and laterality before marking the site Procedure checklist: Completed Consent: Before the procedure and under the influence of no sedative(s), amnesic(s), or anxiolytics, the patient was informed of the treatment options, risks and possible complications. To fulfill our ethical and legal obligations, as recommended by the American Medical Association's Code of Ethics, I have informed the patient of my clinical impression; the nature and purpose of the treatment  or procedure; the risks, benefits, and possible complications of the intervention; the alternatives, including doing nothing; the risk(s) and benefit(s) of the alternative treatment(s) or procedure(s); and the risk(s)  and benefit(s) of doing nothing. The patient was provided information about the general risks and possible complications associated with the procedure. These may include, but are not limited to: failure to achieve desired goals, infection, bleeding, organ or nerve damage, allergic reactions, paralysis, and death. In addition, the patient was informed of those risks and complications associated to the procedure, such as failure to decrease pain; infection; bleeding; organ or nerve damage with subsequent damage to sensory, motor, and/or autonomic systems, resulting in permanent pain, numbness, and/or weakness of one or several areas of the body; allergic reactions; (i.e.: anaphylactic reaction); and/or death. Furthermore, the patient was informed of those risks and complications associated with the medications. These include, but are not limited to: allergic reactions (i.e.: anaphylactic or anaphylactoid reaction(s)); adrenal axis suppression; blood sugar elevation that in diabetics may result in ketoacidosis or comma; water retention that in patients with history of congestive heart failure may result in shortness of breath, pulmonary edema, and decompensation with resultant heart failure; weight gain; swelling or edema; medication-induced neural toxicity; particulate matter embolism and blood vessel occlusion with resultant organ, and/or nervous system infarction; and/or aseptic necrosis of one or more joints. Finally, the patient was informed that Medicine is not an exact science; therefore, there is also the possibility of unforeseen or unpredictable risks and/or possible complications that may result in a catastrophic outcome. The patient indicated having understood very clearly. We have given  the patient no guarantees and we have made no promises. Enough time was given to the patient to ask questions, all of which were answered to the patient's satisfaction. Ms. Miltenberger has indicated that she wanted to continue with the procedure. Attestation: I, the ordering provider, attest that I have discussed with the patient the benefits, risks, side-effects, alternatives, likelihood of achieving goals, and potential problems during recovery for the procedure that I have provided informed consent. Date  Time: 03/21/2019  8:38 AM  Pre-Procedure Preparation:  Monitoring: As per clinic protocol. Respiration, ETCO2, SpO2, BP, heart rate and rhythm monitor placed and checked for adequate function Safety Precautions: Patient was assessed for positional comfort and pressure points before starting the procedure. Time-out: I initiated and conducted the "Time-out" before starting the procedure, as per protocol. The patient was asked to participate by confirming the accuracy of the "Time Out" information. Verification of the correct person, site, and procedure were performed and confirmed by me, the nursing staff, and the patient. "Time-out" conducted as per Joint Commission's Universal Protocol (UP.01.01.01). Time: 0912  Description of Procedure:          Area Prepped: Entire knee area, from mid-thigh to mid-shin, lateral, anterior, and medial aspects. Prepping solution: DuraPrep (Iodine Povacrylex [0.7% available iodine] and Isopropyl Alcohol, 74% w/w) Safety Precautions: Aspiration looking for blood return was conducted prior to all injections. At no point did we inject any substances, as a needle was being advanced. No attempts were made at seeking any paresthesias. Safe injection practices and needle disposal techniques used. Medications properly checked for expiration dates. SDV (single dose vial) medications used. Description of the Procedure: Protocol guidelines were followed. The patient was placed in  position over the procedure table. The target area was identified and the area prepped in the usual manner. Skin & deeper tissues infiltrated with local anesthetic. Appropriate amount of time allowed to pass for local anesthetics to take effect. The procedure needles were then advanced to the target area. Proper needle placement secured. Negative aspiration confirmed. Solution injected in intermittent fashion, asking for  systemic symptoms every 0.5cc of injectate. The needles were then removed and the area cleansed, making sure to leave some of the prepping solution back to take advantage of its long term bactericidal properties.  Vitals:   03/21/19 0915 03/21/19 0920 03/21/19 0924 03/21/19 0928  BP: 116/87 111/82 (!) 106/94 110/84  Pulse:      Resp: 20 16 12 12   Temp:      TempSrc:      SpO2: 96% 95% 96% 95%  Weight:      Height:        Start Time: 0914 hrs. End Time: 0923 hrs. Materials:  Needle(s) Type: Spinal Needle Gauge: 22G Length: 3.5-in Medication(s): Please see orders for medications and dosing details. 5 cc solution made of 4 cc of 0.2% ropivacaine, 1 cc of Decadron 10 mg/cc.  1.5 cc injected at each level on the left. Imaging Guidance (Non-Spinal):          Type of Imaging Technique: Fluoroscopy Guidance (Non-Spinal) Indication(s): Assistance in needle guidance and placement for procedures requiring needle placement in or near specific anatomical locations not easily accessible without such assistance. Exposure Time: Please see nurses notes. Contrast: None used. Fluoroscopic Guidance: I was personally present during the use of fluoroscopy. "Tunnel Vision Technique" used to obtain the best possible view of the target area. Parallax error corrected before commencing the procedure. "Direction-depth-direction" technique used to introduce the needle under continuous pulsed fluoroscopy. Once target was reached, antero-posterior, oblique, and lateral fluoroscopic projection used  confirm needle placement in all planes. Images permanently stored in EMR. Interpretation: No contrast injected. I personally interpreted the imaging intraoperatively. Adequate needle placement confirmed in multiple planes. Permanent images saved into the patient's record.  Antibiotic Prophylaxis:   Anti-infectives (From admission, onward)   None     Indication(s): None identified  Post-operative Assessment:  Post-procedure Vital Signs:  Pulse/HCG Rate: 8373 Temp: 98.3 F (36.8 C) Resp: 12 BP: 110/84 SpO2: 95 %  EBL: None  Complications: No immediate post-treatment complications observed by team, or reported by patient.  Note: The patient tolerated the entire procedure well. A repeat set of vitals were taken after the procedure and the patient was kept under observation following institutional policy, for this type of procedure. Post-procedural neurological assessment was performed, showing return to baseline, prior to discharge. The patient was provided with post-procedure discharge instructions, including a section on how to identify potential problems. Should any problems arise concerning this procedure, the patient was given instructions to immediately contact us, at any time, without hesitation. In any case, we plan to contact the patient by telephone for a follow-up status report regarding this interventional procedure.  Comments:  No additional relevant information.  Plan of Care  Orders:  Orders Placed This Encounter  Procedures  . DG PAIN CLINIC C-ARM 1-60 MIN NO REPORT    Intraoperative interpretation by procedural physician at [place].    Standing Status:   Standing    Number of Occurrences:   1    Order Specific Question:   Reason for exam:    Answer:   Assistance in needle guidance and placement for procedures requiring needle placement in or near specific anatomical locations not easily accessible without such assistance.   Medications ordered for  procedure: Meds ordered this encounter  Medications  . lidocaine (XYLOCAINE) 2 % (with pres) injection 400 mg  . dexamethasone (DECADRON) injection 10 mg  . ropivacaine (PF) 2 mg/mL (0.2%) (NAROPIN) injection 1 mL   Medications administered:  We administered lidocaine, dexamethasone, and ropivacaine (PF) 2 mg/mL (0.2%).  See the medical record for exact dosing, route, and time of administration.  Disposition: Discharge home  Discharge Date & Time: 03/21/2019; 0930 hrs.   Follow-up plan:   Return in about 4 weeks (around 04/18/2019) for Post Procedure Evaluation.     Future Appointments  Date Time Provider [place]  04/18/2019  1:30 PM Gillis Santa, MD [facility] None   Primary Care Physician: Aletha Halim., PA-C Location: [facility] Note by: Gillis Santa, MD Date: 03/21/2019; Time: 10:12 AM  Disclaimer:  Medicine is not an exact science. The only guarantee in medicine is that nothing is guaranteed. It is important to note that the decision to proceed with this intervention was based on the information collected from the patient. The Data and conclusions were drawn from the patient's questionnaire, the interview, and the physical examination. Because the information was provided in large part by the patient, it cannot be guaranteed that it has not been purposely or unconsciously manipulated. Every effort has been made to obtain as much relevant data as possible for this evaluation. It is important to note that the conclusions that lead to this procedure are derived in large part from the available data. Always take into account that the treatment will also be dependent on availability of resources and existing treatment guidelines, considered by other Pain Management Practitioners as being common knowledge and practice, at the time of the intervention. For Medico-Legal purposes, it is also important to point out that variation in procedural techniques  and pharmacological choices are the acceptable norm. The indications, contraindications, technique, and results of the above procedure should only be interpreted and judged by a Board-Certified Interventional Pain Specialist with extensive familiarity and expertise in the same exact procedure and technique.

## 2019-03-22 ENCOUNTER — Telehealth: Payer: Self-pay | Admitting: *Deleted

## 2019-03-22 NOTE — Telephone Encounter (Signed)
No problems post procedure. 

## 2019-04-17 ENCOUNTER — Encounter: Payer: Self-pay | Admitting: Student in an Organized Health Care Education/Training Program

## 2019-04-18 ENCOUNTER — Ambulatory Visit
Payer: 59 | Attending: Student in an Organized Health Care Education/Training Program | Admitting: Student in an Organized Health Care Education/Training Program

## 2019-04-18 ENCOUNTER — Other Ambulatory Visit: Payer: Self-pay

## 2019-04-18 DIAGNOSIS — M1712 Unilateral primary osteoarthritis, left knee: Secondary | ICD-10-CM

## 2019-04-18 DIAGNOSIS — F1290 Cannabis use, unspecified, uncomplicated: Secondary | ICD-10-CM

## 2019-04-18 DIAGNOSIS — M7918 Myalgia, other site: Secondary | ICD-10-CM | POA: Diagnosis not present

## 2019-04-18 DIAGNOSIS — M25562 Pain in left knee: Secondary | ICD-10-CM | POA: Diagnosis not present

## 2019-04-18 DIAGNOSIS — G8929 Other chronic pain: Secondary | ICD-10-CM

## 2019-04-18 DIAGNOSIS — G894 Chronic pain syndrome: Secondary | ICD-10-CM

## 2019-04-18 NOTE — Progress Notes (Signed)
Pain Management Virtual Encounter Note - Virtual Visit via Telephone Telehealth (real-time audio visits between healthcare provider and patient).   Patient's Phone No. & Preferred Pharmacy:  [phone] (home); [phone] (mobile); (Preferred) [phone] [email or web address]  [id number] [place]ina Rabon, [facility] [place] [address] [place] [address] Phone: [phone] Fax: [phone]    Pre-screening note:  Our staff contacted Ms. Keizer and offered her an "in person", "face-to-face" appointment versus a telephone encounter. She indicated preferring the telephone encounter, at this time.   Reason for Virtual Visit: COVID-19*  Social distancing based on CDC and AMA recommendations.   I contacted Joseph Pierini on 04/18/2019 via telephone.      I clearly identified myself as Gillis Santa, MD. I verified that I was speaking with the correct person using two identifiers (Name: KELSEA MOUSEL, and date of birth: 11/27/1965).  Advanced Informed Consent I sought verbal advanced consent from Joseph Pierini for virtual visit interactions. I informed Ms. Geraghty of possible security and privacy concerns, risks, and limitations associated with providing "not-in-person" medical evaluation and management services. I also informed Ms. Cendejas of the availability of "in-person" appointments. Finally, I informed her that there would be a charge for the virtual visit and that she could be  personally, fully or partially, financially responsible for it. Ms. Mckissack expressed understanding and agreed to proceed.   Historic Elements   Ms. HSER BELANGER is a 53 y.o. year old, female patient evaluated today after her last encounter by our practice on 03/22/2019. Ms. Liss  has a past medical history of Adrenal cortex insufficiency ([place]), Arthritis, Complication of anesthesia, Headache(784.0), Hypertension, and Syncope. She also  has a past surgical history that  includes left foot bone spur surgery (10 yrs ago); Abdominal hysterectomy (14 yrs ago); Lumbar laminectomy/decompression microdiscectomy (07/13/2012); Lumbar laminectomy/decompression microdiscectomy (Right, 03/07/2013); Hemi-microdiscectomy lumbar laminectomy level 1 (N/A, 09/28/2013); Breast biopsy (Left, 04/10/2015); Cholecystectomy; Eye surgery; Back surgery; and Anterior cervical decompression/discectomy fusion 4 level (N/A, 10/18/2018). Ms. Osgood has a current medication list which includes the following prescription(s): acetaminophen, alprazolam, amitriptyline, amlodipine-olmesartan, atorvastatin, celecoxib, cholecalciferol, duloxetine, fluticasone, fremanezumab-vfrm, hydrocortisone, methocarbamol, metoprolol succinate, prednisone, pregabalin, and tizanidine. She  reports that she has been smoking cigarettes. She has a 10.50 pack-year smoking history. She has never used smokeless tobacco. She reports current alcohol use. She reports current drug use. Frequency: 7.00 times per week. Drug: Marijuana. Ms. Atwood has No Known Allergies.   HPI  Today, she is being contacted for a post-procedure assessment.  Evaluation of last interventional procedure  03/21/2019 Procedure: Left knee genicular nerve block  Influential Factors: Intra-procedural challenges: None observed.         Reported side-effects: None.        Post-procedural adverse reactions or complications: None reported         Sedation: Please see nurses note for DOS. When no sedatives are used, the analgesic levels obtained are directly associated to the effectiveness of the local anesthetics. However, when sedation is provided, the level of analgesia obtained during the initial 1 hour following the intervention, is believed to be the result of a combination of factors. These factors may include, but are not limited to: 1. The effectiveness of the local anesthetics used. 2. The effects of the analgesic(s) and/or anxiolytic(s) used. 3. The  degree of discomfort experienced by the patient at the time of the procedure. 4. The patients ability and reliability in recalling and recording the events. 5. The presence and influence of  possible secondary gains and/or psychosocial factors. Reported result: Relief experienced during the 1st hour after the procedure: 100 % (Ultra-Short Term Relief)            Interpretative annotation: Clinically appropriate result. Analgesia during this period is likely to be Local Anesthetic and/or IV Sedative (Analgesic/Anxiolytic) related.          Effects of local anesthetic: The analgesic effects attained during this period are directly associated to the localized infiltration of local anesthetics and therefore cary significant diagnostic value as to the etiological location, or anatomical origin, of the pain. Expected duration of relief is directly dependent on the pharmacodynamics of the local anesthetic used. Long-acting (4-6 hours) anesthetics used.  Reported result: Relief during the next 4 to 6 hour after the procedure: 100 % (Short-Term Relief)            Interpretative annotation: Clinically appropriate result. Analgesia during this period is likely to be Local Anesthetic-related.          Long-term benefit: Defined as the period of time past the expected duration of local anesthetics (1 hour for short-acting and 4-6 hours for long-acting). With the possible exception of prolonged sympathetic blockade from the local anesthetics, benefits during this period are typically attributed to, or associated with, other factors such as analgesic sensory neuropraxia, antiinflammatory effects, or beneficial biochemical changes provided by agents other than the local anesthetics.  Reported result: Extended relief following procedure: 50 %(lasting 3 weeks) (Long-Term Relief)            Interpretative annotation: Clinically appropriate result. Good relief. No permanent benefit expected. Inflammation plays a part in the  etiology to the pain.        Positive diagnostic left knee genicular nerve block.  Discussed left knee genicular RFA.  Risks and benefits reviewed and patient would like to proceed.  Patient also has a history of cervical myofascial pain syndrome.  History of C2-C5 ACDF. she has received cervical/trapezius trigger point injections in the past and is requesting repeating those as well. Assessment  The primary encounter diagnosis was Chronic pain of left knee. Diagnoses of Primary osteoarthritis of left knee, Cervical myofascial pain syndrome, Marijuana use, and Chronic pain syndrome were also pertinent to this visit.  Plan of Care  I am having Bridgit L. Perusse maintain her ALPRAZolam, metoprolol succinate, amLODipine-olmesartan, amitriptyline, atorvastatin, celecoxib, DULoxetine, fluticasone, Cholecalciferol, Fremanezumab-vfrm, hydrocortisone, pregabalin, acetaminophen, methocarbamol, tiZANidine, and predniSONE.  Significant pain relief and improvement in functional status after diagnostic left knee genicular nerve block #1.  Discussed genicular nerve radiofrequency ablation.  Cervical myofascial pain syndrome, has had cervical and trapezius trigger point injections in the past and is requesting repeat.  History of C2-C5 ACDF.  Orders:  Orders Placed This Encounter  Procedures  . Radiofrequency,Genicular    Standing Status:   Future    Standing Expiration Date:   10/18/2020    Scheduling Instructions:     Side(s): LEFT     Level(s): Superior-Lateral, Superior-Medial, and Inferior-Medial Genicular Nerve(s)     Sedation: With Sedation     Scheduling Timeframe: As soon as pre-approved    Order Specific Question:   Where will this procedure be performed?    Answer:   [facility]  . TRIGGER POINT INJECTION    Standing Status:   Future    Standing Expiration Date:   05/18/2019    Scheduling Instructions:     Cervical TPI (done on same day as genicular nerve RFA)  Order Specific  Question:   Where will this procedure be performed?    Answer:   [facility]   Follow-up plan:    Return for Procedure (GN RFA + Cervical TPI w sed 1 hr block).    Recent Visits Date Type Provider Dept  03/21/19 Procedure visit Gillis Santa, MD [facility]  Showing recent visits within past 90 days and meeting all other requirements   Today's Visits Date Type Provider Dept  04/18/19 Office Visit Gillis Santa, MD [facility]  Showing today's visits and meeting all other requirements   Future Appointments No visits were found meeting these conditions.  Showing future appointments within next 90 days and meeting all other requirements   I discussed the assessment and treatment plan with the patient. The patient was provided an opportunity to ask questions and all were answered. The patient agreed with the plan and demonstrated an understanding of the instructions.  Patient advised to call back or seek an in-person evaluation if the symptoms or condition worsens.  Total duration of non-face-to-face encounter: 25 minutes.  Note by: Gillis Santa, MD Date: 04/18/2019; Time: 2:17 PM  Note: This dictation was prepared with Dragon dictation. Any transcriptional errors that may result from this process are unintentional.  Disclaimer:  * Given the special circumstances of the COVID-19 pandemic, the federal government has announced that the Office for Civil Rights (OCR) will exercise its enforcement discretion and will not impose penalties on physicians using telehealth in the event of noncompliance with regulatory requirements under the [place] (HIPAA) in connection with the good faith provision of telehealth during the SAYTK-16 national public health emergency. ([place])

## 2019-04-25 ENCOUNTER — Other Ambulatory Visit: Payer: Self-pay

## 2019-04-25 ENCOUNTER — Other Ambulatory Visit
Admission: RE | Admit: 2019-04-25 | Discharge: 2019-04-25 | Disposition: A | Discharge status: Home / Self Care | Payer: 59 | Source: Ambulatory Visit | Attending: Student in an Organized Health Care Education/Training Program | Admitting: Student in an Organized Health Care Education/Training Program

## 2019-04-25 DIAGNOSIS — Z1159 Encounter for screening for other viral diseases: Secondary | ICD-10-CM | POA: Diagnosis present

## 2019-04-25 LAB — SARS CORONAVIRUS 2 (TAT 6-24 HRS): SARS Coronavirus 2: NEGATIVE

## 2019-04-30 ENCOUNTER — Other Ambulatory Visit: Payer: Self-pay | Admitting: Family Medicine

## 2019-04-30 ENCOUNTER — Ambulatory Visit (HOSPITAL_BASED_OUTPATIENT_CLINIC_OR_DEPARTMENT_OTHER): Payer: 59 | Admitting: Student in an Organized Health Care Education/Training Program

## 2019-04-30 ENCOUNTER — Other Ambulatory Visit: Payer: Self-pay

## 2019-04-30 ENCOUNTER — Ambulatory Visit
Admission: RE | Admit: 2019-04-30 | Discharge: 2019-04-30 | Disposition: A | Discharge status: Home / Self Care | Payer: 59 | Source: Ambulatory Visit | Attending: Student in an Organized Health Care Education/Training Program | Admitting: Student in an Organized Health Care Education/Training Program

## 2019-04-30 ENCOUNTER — Encounter: Payer: Self-pay | Admitting: Student in an Organized Health Care Education/Training Program

## 2019-04-30 VITALS — BP 114/80 | HR 74 | Temp 98.0°F | Resp 20 | Ht 65.0 in | Wt 168.0 lb

## 2019-04-30 DIAGNOSIS — M1712 Unilateral primary osteoarthritis, left knee: Secondary | ICD-10-CM | POA: Insufficient documentation

## 2019-04-30 DIAGNOSIS — M25562 Pain in left knee: Secondary | ICD-10-CM | POA: Insufficient documentation

## 2019-04-30 DIAGNOSIS — G8929 Other chronic pain: Secondary | ICD-10-CM

## 2019-04-30 DIAGNOSIS — Z1231 Encounter for screening mammogram for malignant neoplasm of breast: Secondary | ICD-10-CM

## 2019-04-30 DIAGNOSIS — M7918 Myalgia, other site: Secondary | ICD-10-CM | POA: Insufficient documentation

## 2019-04-30 MED ORDER — LIDOCAINE HCL 2 % IJ SOLN
20.0000 mL | Freq: Once | INTRAMUSCULAR | Status: AC
  Administered 2019-04-30: 400 mg
  Filled 2019-04-30: qty 20

## 2019-04-30 MED ORDER — ROPIVACAINE HCL 2 MG/ML IJ SOLN
1.0000 mL | Freq: Once | INTRAMUSCULAR | Status: AC
  Administered 2019-04-30: 9 mL via EPIDURAL
  Filled 2019-04-30: qty 10

## 2019-04-30 MED ORDER — MIDAZOLAM HCL 5 MG/5ML IJ SOLN
1.0000 mg | INTRAMUSCULAR | Status: DC | PRN
  Filled 2019-04-30: qty 5

## 2019-04-30 MED ORDER — FENTANYL CITRATE (PF) 100 MCG/2ML IJ SOLN
25.0000 ug | INTRAMUSCULAR | Status: DC | PRN
  Administered 2019-04-30: 75 ug via INTRAVENOUS
  Filled 2019-04-30: qty 2

## 2019-04-30 MED ORDER — ROPIVACAINE HCL 2 MG/ML IJ SOLN
INTRAMUSCULAR | Status: AC
  Filled 2019-04-30: qty 10

## 2019-04-30 MED ORDER — ROPIVACAINE HCL 2 MG/ML IJ SOLN
2.0000 mL | Freq: Once | INTRAMUSCULAR | Status: AC
  Administered 2019-04-30: 2 mL via EPIDURAL

## 2019-04-30 MED ORDER — DEXAMETHASONE SODIUM PHOSPHATE 10 MG/ML IJ SOLN
10.0000 mg | Freq: Once | INTRAMUSCULAR | Status: AC
  Administered 2019-04-30: 10 mg
  Filled 2019-04-30: qty 1

## 2019-04-30 NOTE — Progress Notes (Signed)
Patient's Name: Abigail [name]  MRN: [id number]  Referring Provider: Gillis Santa, MD  DOB: [birth year]  PCP: Aletha Halim., PA-C  DOS: 04/30/2019  Note by: Gillis Santa, MD  Service setting: Ambulatory outpatient  Specialty: Interventional Pain Management  Patient type: Established  Location: [facility] ([facility]) [facility]  Visit type: Interventional Procedure   Primary Reason for Visit: Interventional Pain Management Treatment. CC: Knee Pain (left) and Shoulder Pain (bilateral)  Procedure:          Anesthesia, Analgesia, Anxiolysis:  Type: Trigger Point Injection (3+)          CPT: 20553 Primary Purpose: Therapeutic Region: Posterior Cervicothoracic Level: Cervico-thoracic Target Area: Cervical, thoracic, periscapular trigger Point Approach: Percutaneous, ipsilateral approach. Laterality: Bilateral        Type: Local Anesthesia    Indications: Myofascial pain syndrome  Pain Score: Pre-procedure: 7 /10 Post-procedure: 3 /10   Pre-op Assessment:  Abigail [name] is a 53 y.o. (year old), female patient, seen today for interventional treatment. She  has a past surgical history that includes left foot bone spur surgery (10 yrs ago); Abdominal hysterectomy (14 yrs ago); Lumbar laminectomy/decompression microdiscectomy (07/13/2012); Lumbar laminectomy/decompression microdiscectomy (Right, 03/07/2013); Hemi-microdiscectomy lumbar laminectomy level 1 (N/A, 09/28/2013); Breast biopsy (Left, 04/10/2015); Cholecystectomy; Eye surgery; Back surgery; and Anterior cervical decompression/discectomy fusion 4 level (N/A, 10/18/2018). Abigail [name] has a current medication list which includes the following prescription(s): acetaminophen, alprazolam, amitriptyline, amlodipine-olmesartan, atorvastatin, celecoxib, cholecalciferol, duloxetine, fluticasone, fremanezumab-vfrm, hydrocortisone, methocarbamol, metoprolol succinate, prednisone, pregabalin, and tizanidine, and the following Facility-Administered  Medications: fentanyl and midazolam. Her primarily concern today is the Knee Pain (left) and Shoulder Pain (bilateral)  Initial Vital Signs:  Pulse/HCG Rate: 74ECG Heart Rate: 62 Temp: 98 F (36.7 C) Resp: 16 BP: 111/82 SpO2: 99 %  BMI: Estimated body mass index is 27.96 kg/m as calculated from the following:   Height as of this encounter: 5\' 5"  (1.651 m).   Weight as of this encounter: 168 lb (76.2 kg).  Risk Assessment: Allergies: Reviewed. She has No Known Allergies.  Allergy Precautions: None required Coagulopathies: Reviewed. None identified.  Blood-thinner therapy: None at this time Active Infection(s): Reviewed. None identified. Abigail [name] is afebrile  Site Confirmation: Abigail [name] was asked to confirm the procedure and laterality before marking the site Procedure checklist: Completed Consent: Before the procedure and under the influence of no sedative(s), amnesic(s), or anxiolytics, the patient was informed of the treatment options, risks and possible complications. To fulfill our ethical and legal obligations, as recommended by the American Medical Association's Code of Ethics, I have informed the patient of my clinical impression; the nature and purpose of the treatment or procedure; the risks, benefits, and possible complications of the intervention; the alternatives, including doing nothing; the risk(s) and benefit(s) of the alternative treatment(s) or procedure(s); and the risk(s) and benefit(s) of doing nothing. The patient was provided information about the general risks and possible complications associated with the procedure. These may include, but are not limited to: failure to achieve desired goals, infection, bleeding, organ or nerve damage, allergic reactions, paralysis, and death. In addition, the patient was informed of those risks and complications associated to the procedure, such as failure to decrease pain; infection; bleeding; organ or nerve damage with  subsequent damage to sensory, motor, and/or autonomic systems, resulting in permanent pain, numbness, and/or weakness of one or several areas of the body; allergic reactions; (i.e.: anaphylactic reaction); and/or death. Furthermore, the patient was informed of those risks and complications associated with  the medications. These include, but are not limited to: allergic reactions (i.e.: anaphylactic or anaphylactoid reaction(s)); adrenal axis suppression; blood sugar elevation that in diabetics may result in ketoacidosis or comma; water retention that in patients with history of congestive heart failure may result in shortness of breath, pulmonary edema, and decompensation with resultant heart failure; weight gain; swelling or edema; medication-induced neural toxicity; particulate matter embolism and blood vessel occlusion with resultant organ, and/or nervous system infarction; and/or aseptic necrosis of one or more joints. Finally, the patient was informed that Medicine is not an exact science; therefore, there is also the possibility of unforeseen or unpredictable risks and/or possible complications that may result in a catastrophic outcome. The patient indicated having understood very clearly. We have given the patient no guarantees and we have made no promises. Enough time was given to the patient to ask questions, all of which were answered to the patient's satisfaction. Abigail [name] has indicated that she wanted to continue with the procedure. Attestation: I, the ordering provider, attest that I have discussed with the patient the benefits, risks, side-effects, alternatives, likelihood of achieving goals, and potential problems during recovery for the procedure that I have provided informed consent. Date  Time: 04/30/2019 12:24 PM  Pre-Procedure Preparation:  Monitoring: As per clinic protocol. Respiration, ETCO2, SpO2, BP, heart rate and rhythm monitor placed and checked for adequate function Safety  Precautions: Patient was assessed for positional comfort and pressure points before starting the procedure. Time-out: I initiated and conducted the "Time-out" before starting the procedure, as per protocol. The patient was asked to participate by confirming the accuracy of the "Time Out" information. Verification of the correct person, site, and procedure were performed and confirmed by me, the nursing staff, and the patient. "Time-out" conducted as per Joint Commission's Universal Protocol (UP.01.01.01). Time: 1348  Description of Procedure:          Area Prepped: Entire Posterior Cervicothoracic Region Prepping solution: DuraPrep (Iodine Povacrylex [0.7% available iodine] and Isopropyl Alcohol, 74% w/w) Safety Precautions: Aspiration looking for blood return was conducted prior to all injections. At no point did we inject any substances, as a needle was being advanced. No attempts were made at seeking any paresthesias. Safe injection practices and needle disposal techniques used. Medications properly checked for expiration dates. SDV (single dose vial) medications used. Description of the Procedure: Protocol guidelines were followed. The patient was placed in position over the fluoroscopy table. The target area was identified and the area prepped in the usual manner. Skin & deeper tissues infiltrated with local anesthetic. Appropriate amount of time allowed to pass for local anesthetics to take effect. The procedure needles were then advanced to the target area. Proper needle placement secured. Negative aspiration confirmed. Solution injected in intermittent fashion, asking for systemic symptoms every 0.5cc of injectate. The needles were then removed and the area cleansed, making sure to leave some of the prepping solution back to take advantage of its long term bactericidal properties.  Vitals:   04/30/19 1405 04/30/19 1415 04/30/19 1425 04/30/19 1436  BP: 110/67 106/66 114/79 114/80  Pulse:       Resp: 13 16 13 20   Temp:      SpO2: 94% 98% 99% 98%  Weight:      Height:        Start Time: 1348 hrs. End Time: 1405 hrs. Materials:  Needle(s) Type: Regular needle Gauge: 25G Length: 1.5-in Medication(s): Please see orders for medications and dosing details. Approximately 18 trigger points injected with  0.5 to 1 cc of 0.2% ropivacaine with dry needling performed at each trigger point. Imaging Guidance:          Type of Imaging Technique: None used Indication(s): N/A Exposure Time: No patient exposure Contrast: None used. Fluoroscopic Guidance: N/A Ultrasound Guidance: N/A Interpretation: N/A  Antibiotic Prophylaxis:   Anti-infectives (From admission, onward)   None     Indication(s): None identified  Post-operative Assessment:  Post-procedure Vital Signs:  Pulse/HCG Rate: 7473 Temp: 98 F (36.7 C) Resp: 20 BP: 114/80 SpO2: 98 %  EBL: None  Complications: No immediate post-treatment complications observed by team, or reported by patient.  Note: The patient tolerated the entire procedure well. A repeat set of vitals were taken after the procedure and the patient was kept under observation following institutional policy, for this type of procedure. Post-procedural neurological assessment was performed, showing return to baseline, prior to discharge. The patient was provided with post-procedure discharge instructions, including a section on how to identify potential problems. Should any problems arise concerning this procedure, the patient was given instructions to immediately contact us, at any time, without hesitation. In any case, we plan to contact the patient by telephone for a follow-up status report regarding this interventional procedure.  Comments:  No additional relevant information.  Plan of Care  Orders:  Orders Placed This Encounter  Procedures  . TRIGGER POINT INJECTION    Standing Status:   Standing    Number of Occurrences:   10    Standing  Expiration Date:   04/29/2020    Scheduling Instructions:     Cervical/ Trapezius     TIMEFRAME: PRN procedure. (Ms. Glick will call when needed.)    Order Specific Question:   Where will this procedure be performed?    Answer:   [facility]  . DG PA[facility] C-ARM 1-60 MIN NO REPORT    Intraoperative interpretation by procedural physician at [place].    Standing Status:   Standing    Number of Occurrences:   1    Order Specific Question:   Reason for exam:    Answer:   Assistance in needle guidance and placement for procedures requiring needle placement in or near specific anatomical locations not easily accessible without such assistance.   Medications ordered for procedure: Meds ordered this encounter  Medications  . lidocaine (XYLOCAINE) 2 % (with pres) injection 400 mg  . fentaNYL (SUBLIMAZE) injection 25 mcg    Make sure Narcan is available in the pyxis when using this medication. In the event of respiratory depression (RR< 8/min): Titrate NARCAN (naloxone) in increments of 0.1 to 0.2 mg IV at 2-3 minute intervals, until desired degree of reversal.  . midazolam (VERSED) 5 MG/5ML injection 1 mg    Make sure Flumazenil is available in the pyxis when using this medication. If oversedation occurs, administer 0.2 mg IV over 15 sec. If after 45 sec no response, administer 0.2 mg again over 1 min; may repeat at 1 min intervals; not to exceed 4 doses (1 mg)  . ropivacaine (PF) 2 mg/mL (0.2%) (NAROPIN) injection 1 mL  . dexamethasone (DECADRON) injection 10 mg  . ropivacaine (PF) 2 mg/mL (0.2%) (NAROPIN) injection 2 mL  . ropivacaine (PF) 2 mg/mL (0.2%) (NAROPIN) injection 2 mL   Medications administered: We administered lidocaine, fentaNYL, ropivacaine (PF) 2 mg/mL (0.2%), dexamethasone, ropivacaine (PF) 2 mg/mL (0.2%), and ropivacaine (PF) 2 mg/mL (0.2%).  See the medical record for exact dosing, route, and time of  administration.  Follow-up plan:   Return in  about 6 weeks (around 06/11/2019) for Post Procedure Evaluation, virtual.     Recent Visits Date Type Provider Dept  04/18/19 Office Visit Gillis Santa, MD [facility]  03/21/19 Procedure visit Gillis Santa, MD [facility]  Showing recent visits within past 90 days and meeting all other requirements   Today's Visits Date Type Provider Dept  04/30/19 Procedure visit Gillis Santa, MD [facility]  Showing today's visits and meeting all other requirements   Future Appointments Date Type Provider Dept  05/28/19 Appointment Gillis Santa, MD [facility]  06/07/19 Appointment Gillis Santa, MD [facility]  Showing future appointments within next 90 days and meeting all other requirements   Disposition: Discharge home  Discharge Date & Time: 04/30/2019; 1435 hrs.   Primary Care Physician: Aletha Halim PA-C Location: [facility] Note by: Gillis Santa, MD Date: 04/30/2019; Time: 4:08 PM  Disclaimer:  Medicine is not an exact science. The only guarantee in medicine is that nothing is guaranteed. It is important to note that the decision to proceed with this intervention was based on the information collected from the patient. The Data and conclusions were drawn from the patient's questionnaire, the interview, and the physical examination. Because the information was provided in large part by the patient, it cannot be guaranteed that it has not been purposely or unconsciously manipulated. Every effort has been made to obtain as much relevant data as possible for this evaluation. It is important to note that the conclusions that lead to this procedure are derived in large part from the available data. Always take into account that the treatment will also be dependent on availability of resources and existing treatment guidelines, considered by other Pain Management Practitioners as being common knowledge and  practice, at the time of the intervention. For Medico-Legal purposes, it is also important to point out that variation in procedural techniques and pharmacological choices are the acceptable norm. The indications, contraindications, technique, and results of the above procedure should only be interpreted and judged by a Board-Certified Interventional Pain Specialist with extensive familiarity and expertise in the same exact procedure and technique.

## 2019-04-30 NOTE — Progress Notes (Signed)
Patient's Name: Abigail [name]  MRN: [id number]  Referring Provider: Gillis Santa, MD  DOB: [birth year]  PCP: Aletha Halim., PA-C  DOS: 04/30/2019  Note by: Gillis Santa, MD  Service setting: Ambulatory outpatient  Specialty: Interventional Pain Management  Patient type: Established  Location: [facility] (AMB) Pain Management Facility  Visit type: Interventional Procedure   Primary Reason for Visit: Interventional Pain Management Treatment. CC: Knee Pain (left) and Shoulder Pain (bilateral)  Procedure:          Anesthesia, Analgesia, Anxiolysis:  Type: Therapeutic Superior-lateral, Superior-medial, and Inferior-medial, Genicular Nerve Radiofrequency Ablation.  #1  Region: Lateral, Anterior, and Medial aspects of the knee joint, above and below the knee joint proper. Level: Superior and inferior to the knee joint. Laterality: Left  Type: Moderate (Conscious) Sedation combined with Local Anesthesia Indication(s): Analgesia and Anxiety Route: Intravenous (IV) IV Access: Secured Sedation: Meaningful verbal contact was maintained at all times during the procedure  Local Anesthetic: Lidocaine 1-2%  Position: Supine   Indications: 1. Chronic pain of left knee   2. Primary osteoarthritis of left knee   3. Cervical myofascial pain syndrome    Abigail [name] has been dealing with the above chronic pain for longer than three months and has either failed to respond, was unable to tolerate, or simply did not get enough benefit from other more conservative therapies including, but not limited to: 1. Over-the-counter medications 2. Anti-inflammatory medications 3. Muscle relaxants 4. Membrane stabilizers 5. Opioids 6. Physical therapy and/or chiropractic manipulation 7. Modalities (Heat, ice, etc.) 8. Invasive techniques such as nerve blocks. Abigail [name] has attained more than 50% relief of the pain from a series of diagnostic injections conducted in separate occasions.  Pain  Score: Pre-procedure: 7 /10 Post-procedure: 3 /10  Pre-op Assessment:  Abigail [name] is a 53 y.o. (year old), female patient, seen today for interventional treatment. She  has a past surgical history that includes left foot bone spur surgery (10 yrs ago); Abdominal hysterectomy (14 yrs ago); Lumbar laminectomy/decompression microdiscectomy (07/13/2012); Lumbar laminectomy/decompression microdiscectomy (Right, 03/07/2013); Hemi-microdiscectomy lumbar laminectomy level 1 (N/A, 09/28/2013); Breast biopsy (Left, 04/10/2015); Cholecystectomy; Eye surgery; Back surgery; and Anterior cervical decompression/discectomy fusion 4 level (N/A, 10/18/2018). Abigail [name] has a current medication list which includes the following prescription(s): acetaminophen, alprazolam, amitriptyline, amlodipine-olmesartan, atorvastatin, celecoxib, cholecalciferol, duloxetine, fluticasone, fremanezumab-vfrm, hydrocortisone, methocarbamol, metoprolol succinate, prednisone, pregabalin, and tizanidine, and the following Facility-Administered Medications: fentanyl and midazolam. Her primarily concern today is the Knee Pain (left) and Shoulder Pain (bilateral)  Initial Vital Signs:  Pulse/HCG Rate: 74ECG Heart Rate: 62 Temp: 98 F (36.7 C) Resp: 16 BP: 111/82 SpO2: 99 %  BMI: Estimated body mass index is 27.96 kg/m as calculated from the following:   Height as of this encounter: 5\' 5"  (1.651 m).   Weight as of this encounter: 168 lb (76.2 kg).  Risk Assessment: Allergies: Reviewed. She has No Known Allergies.  Allergy Precautions: None required Coagulopathies: Reviewed. None identified.  Blood-thinner therapy: None at this time Active Infection(s): Reviewed. None identified. Abigail [name] is afebrile  Site Confirmation: Abigail [name] was asked to confirm the procedure and laterality before marking the site Procedure checklist: Completed Consent: Before the procedure and under the influence of no sedative(s), amnesic(s), or  anxiolytics, the patient was informed of the treatment options, risks and possible complications. To fulfill our ethical and legal obligations, as recommended by the American Medical Association's Code of Ethics, I have informed the patient of my clinical impression; the nature and purpose of  the treatment or procedure; the risks, benefits, and possible complications of the intervention; the alternatives, including doing nothing; the risk(s) and benefit(s) of the alternative treatment(s) or procedure(s); and the risk(s) and benefit(s) of doing nothing. The patient was provided information about the general risks and possible complications associated with the procedure. These may include, but are not limited to: failure to achieve desired goals, infection, bleeding, organ or nerve damage, allergic reactions, paralysis, and death. In addition, the patient was informed of those risks and complications associated to the procedure, such as failure to decrease pain; infection; bleeding; organ or nerve damage with subsequent damage to sensory, motor, and/or autonomic systems, resulting in permanent pain, numbness, and/or weakness of one or several areas of the body; allergic reactions; (i.e.: anaphylactic reaction); and/or death. Furthermore, the patient was informed of those risks and complications associated with the medications. These include, but are not limited to: allergic reactions (i.e.: anaphylactic or anaphylactoid reaction(s)); adrenal axis suppression; blood sugar elevation that in diabetics may result in ketoacidosis or comma; water retention that in patients with history of congestive heart failure may result in shortness of breath, pulmonary edema, and decompensation with resultant heart failure; weight gain; swelling or edema; medication-induced neural toxicity; particulate matter embolism and blood vessel occlusion with resultant organ, and/or nervous system infarction; and/or aseptic necrosis of one or  more joints. Finally, the patient was informed that Medicine is not an exact science; therefore, there is also the possibility of unforeseen or unpredictable risks and/or possible complications that may result in a catastrophic outcome. The patient indicated having understood very clearly. We have given the patient no guarantees and we have made no promises. Enough time was given to the patient to ask questions, all of which were answered to the patient's satisfaction. Ms. Sabina has indicated that she wanted to continue with the procedure. Attestation: I, the ordering provider, attest that I have discussed with the patient the benefits, risks, side-effects, alternatives, likelihood of achieving goals, and potential problems during recovery for the procedure that I have provided informed consent. Date  Time: 04/30/2019 12:24 PM  Pre-Procedure Preparation:  Monitoring: As per clinic protocol. Respiration, ETCO2, SpO2, BP, heart rate and rhythm monitor placed and checked for adequate function Safety Precautions: Patient was assessed for positional comfort and pressure points before starting the procedure. Time-out: I initiated and conducted the "Time-out" before starting the procedure, as per protocol. The patient was asked to participate by confirming the accuracy of the "Time Out" information. Verification of the correct person, site, and procedure were performed and confirmed by me, the nursing staff, and the patient. "Time-out" conducted as per Joint Commission's Universal Protocol (UP.01.01.01). Time: 1348  Description of Procedure:          Target Area: For Genicular Nerve block(s), the targets are: the superior-lateral genicular nerve, located in the lateral distal portion of the femoral shaft as it curves to form the lateral epicondyle, in the region of the distal femoral metaphysis; the superior-medial genicular nerve, located in the medial distal portion of the femoral shaft as it curves to  form the medial epicondyle; and the inferior-medial genicular nerve, located in the medial, proximal portion of the tibial shaft, as it curves to form the medial epicondyle, in the region of the proximal tibial metaphysis. Approach: Anterior, ipsilateral approach. Area Prepped: Entire knee area, from mid-thigh to mid-shin, lateral, anterior, and medial aspects. Prepping solution: DuraPrep (Iodine Povacrylex [0.7% available iodine] and Isopropyl Alcohol, 74% w/w) Safety Precautions: Aspiration looking for  blood return was conducted prior to all injections. At no point did we inject any substances, as a needle was being advanced. No attempts were made at seeking any paresthesias. Safe injection practices and needle disposal techniques used. Medications properly checked for expiration dates. SDV (single dose vial) medications used. Description of the Procedure: Protocol guidelines were followed. The patient was placed in position over the procedure table. The target area was identified and the area prepped in the usual manner. The skin and muscle were infiltrated with local anesthetic. Appropriate amount of time allowed to pass for local anesthetics to take effect. Radiofrequency needles were introduced to the target area using fluoroscopic guidance. Using the NeuroTherm NT1100 Radiofrequency Generator, sensory stimulation using 50 Hz was used to locate & identify the nerve, making sure that the needle was positioned such that there was no sensory stimulation below 0.3 V or above 0.7 V. Stimulation using 2 Hz was used to evaluate the motor component. Care was taken not to lesion any nerves that demonstrated motor stimulation of the lower extremities at an output of less than 2.5 times that of the sensory threshold, or a maximum of 2.0 V. Once satisfactory placement of the needles was achieved, the numbing solution was slowly injected after negative aspiration. After waiting for at least 2 minutes, the ablation was  performed at 80 degrees C for 60 seconds, using regular Radiofrequency settings. Once the procedure was completed, the needles were then removed and the area cleansed, making sure to leave some of the prepping solution back to take advantage of its long term bactericidal properties. Intra-operative Compliance: Compliant Vitals:   04/30/19 1405 04/30/19 1415 04/30/19 1425 04/30/19 1436  BP: 110/67 106/66 114/79 114/80  Pulse:      Resp: 13 16 13 20   Temp:      SpO2: 94% 98% 99% 98%  Weight:      Height:        Start Time: 1348 hrs. End Time: 1405 hrs. Materials & Medications:  Needle(s) Type: Teflon-coated, curved tip, Radiofrequency needle(s) Gauge: 22G Length: 10cm Medication(s): Please see orders for medications and dosing details. 5 cc solution made of 4 cc of 0.2% ropivacaine, 1 cc of Decadron 10 mg/cc.  1.5 cc injected at each level above for the left knee.   Imaging Guidance (Non-Spinal):          Type of Imaging Technique: Fluoroscopy Guidance (Non-Spinal) Indication(s): Assistance in needle guidance and placement for procedures requiring needle placement in or near specific anatomical locations not easily accessible without such assistance. Exposure Time: Please see nurses notes. Contrast: Before injecting any contrast, we confirmed that the patient did not have an allergy to iodine, shellfish, or radiological contrast. Once satisfactory needle placement was completed at the desired level, radiological contrast was injected. Contrast injected under live fluoroscopy. No contrast complications. See chart for type and volume of contrast used. Fluoroscopic Guidance: I was personally present during the use of fluoroscopy. "Tunnel Vision Technique" used to obtain the best possible view of the target area. Parallax error corrected before commencing the procedure. "Direction-depth-direction" technique used to introduce the needle under continuous pulsed fluoroscopy. Once target was reached,  antero-posterior, oblique, and lateral fluoroscopic projection used confirm needle placement in all planes. Images permanently stored in EMR. Interpretation: I personally interpreted the imaging intraoperatively. Adequate needle placement confirmed in multiple planes. Appropriate spread of contrast into desired area was observed. No evidence of afferent or efferent intravascular uptake. Permanent images saved into the patient's record.  Antibiotic Prophylaxis:   Anti-infectives (From admission, onward)   None     Indication(s): None identified  Post-operative Assessment:  Post-procedure Vital Signs:  Pulse/HCG Rate: 7473 Temp: 98 F (36.7 C) Resp: 20 BP: 114/80 SpO2: 98 %  EBL: None  Complications: No immediate post-treatment complications observed by team, or reported by patient.  Note: The patient tolerated the entire procedure well. A repeat set of vitals were taken after the procedure and the patient was kept under observation following institutional policy, for this type of procedure. Post-procedural neurological assessment was performed, showing return to baseline, prior to discharge. The patient was provided with post-procedure discharge instructions, including a section on how to identify potential problems. Should any problems arise concerning this procedure, the patient was given instructions to immediately contact us, at any time, without hesitation. In any case, we plan to contact the patient by telephone for a follow-up status report regarding this interventional procedure.  Comments:  No additional relevant information. 5 out of 5 strength bilateral lower extremity: Plantar flexion, dorsiflexion, knee flexion, knee extension.  Plan of Care  Orders:  Orders Placed This Encounter  Procedures  . TRIGGER POINT INJECTION    Standing Status:   Standing    Number of Occurrences:   10    Standing Expiration Date:   04/29/2020    Scheduling Instructions:     Cervical/  Trapezius     TIMEFRAME: PRN procedure. (Ms. Deeley will call when needed.)    Order Specific Question:   Where will this procedure be performed?    Answer:   [facility]  . DG PA[facility] C-ARM 1-60 MIN NO REPORT    Intraoperative interpretation by procedural physician at [place].    Standing Status:   Standing    Number of Occurrences:   1    Order Specific Question:   Reason for exam:    Answer:   Assistance in needle guidance and placement for procedures requiring needle placement in or near specific anatomical locations not easily accessible without such assistance.   Medications ordered for procedure: Meds ordered this encounter  Medications  . lidocaine (XYLOCAINE) 2 % (with pres) injection 400 mg  . fentaNYL (SUBLIMAZE) injection 25 mcg    Make sure Narcan is available in the pyxis when using this medication. In the event of respiratory depression (RR< 8/min): Titrate NARCAN (naloxone) in increments of 0.1 to 0.2 mg IV at 2-3 minute intervals, until desired degree of reversal.  . midazolam (VERSED) 5 MG/5ML injection 1 mg    Make sure Flumazenil is available in the pyxis when using this medication. If oversedation occurs, administer 0.2 mg IV over 15 sec. If after 45 sec no response, administer 0.2 mg again over 1 min; may repeat at 1 min intervals; not to exceed 4 doses (1 mg)  . ropivacaine (PF) 2 mg/mL (0.2%) (NAROPIN) injection 1 mL  . dexamethasone (DECADRON) injection 10 mg  . ropivacaine (PF) 2 mg/mL (0.2%) (NAROPIN) injection 2 mL  . ropivacaine (PF) 2 mg/mL (0.2%) (NAROPIN) injection 2 mL   Medications administered: We administered lidocaine, fentaNYL, ropivacaine (PF) 2 mg/mL (0.2%), dexamethasone, ropivacaine (PF) 2 mg/mL (0.2%), and ropivacaine (PF) 2 mg/mL (0.2%).  See the medical record for exact dosing, route, and time of administration.  Follow-up plan:   Return in about 6 weeks (around 06/11/2019) for Post Procedure Evaluation, virtual.       s/p L GN RFA on 7/20 and TPI 7/20   Recent  Visits Date Type Provider Dept  04/18/19 Office Visit Gillis Santa, MD [facility]  03/21/19 Procedure visit Gillis Santa, MD [facility]  Showing recent visits within past 90 days and meeting all other requirements   Today's Visits Date Type Provider Dept  04/30/19 Procedure visit Gillis Santa, MD [facility]  Showing today's visits and meeting all other requirements   Future Appointments Date Type Provider Dept  05/28/19 Appointment Gillis Santa, MD [facility]  06/07/19 Appointment Gillis Santa, MD [facility]  Showing future appointments within next 90 days and meeting all other requirements   Disposition: Discharge home  Discharge Date & Time: 04/30/2019; 1435 hrs.   Primary Care Physician: Aletha Halim PA-C Location: [facility] Note by: Gillis Santa, MD Date: 04/30/2019; Time: 4:04 PM  Disclaimer:  Medicine is not an exact science. The only guarantee in medicine is that nothing is guaranteed. It is important to note that the decision to proceed with this intervention was based on the information collected from the patient. The Data and conclusions were drawn from the patient's questionnaire, the interview, and the physical examination. Because the information was provided in large part by the patient, it cannot be guaranteed that it has not been purposely or unconsciously manipulated. Every effort has been made to obtain as much relevant data as possible for this evaluation. It is important to note that the conclusions that lead to this procedure are derived in large part from the available data. Always take into account that the treatment will also be dependent on availability of resources and existing treatment guidelines, considered by other Pain Management Practitioners as being common knowledge and practice, at the time of the intervention.  For Medico-Legal purposes, it is also important to point out that variation in procedural techniques and pharmacological choices are the acceptable norm. The indications, contraindications, technique, and results of the above procedure should only be interpreted and judged by a Board-Certified Interventional Pain Specialist with extensive familiarity and expertise in the same exact procedure and technique.

## 2019-04-30 NOTE — Progress Notes (Signed)
Safety precautions to be maintained throughout the outpatient stay will include: orient to surroundings, keep bed in low position, maintain call bell within reach at all times, provide assistance with transfer out of bed and ambulation.  

## 2019-04-30 NOTE — Patient Instructions (Signed)

## 2019-05-01 ENCOUNTER — Telehealth: Payer: Self-pay

## 2019-05-01 NOTE — Telephone Encounter (Signed)
Patient was called and no problems reported. 

## 2019-05-10 ENCOUNTER — Encounter: Payer: Self-pay | Admitting: Student in an Organized Health Care Education/Training Program

## 2019-05-10 NOTE — Progress Notes (Signed)
Patient would like to discuss new issue re; pain in both hips, with the left being worse.  The pain is going down into both legs to the thigh in the front and side of thigh.

## 2019-05-14 ENCOUNTER — Other Ambulatory Visit: Payer: Self-pay

## 2019-05-14 ENCOUNTER — Ambulatory Visit
Payer: 59 | Attending: Student in an Organized Health Care Education/Training Program | Admitting: Student in an Organized Health Care Education/Training Program

## 2019-05-14 ENCOUNTER — Encounter: Payer: Self-pay | Admitting: Student in an Organized Health Care Education/Training Program

## 2019-05-14 DIAGNOSIS — M5416 Radiculopathy, lumbar region: Secondary | ICD-10-CM

## 2019-05-14 DIAGNOSIS — G8929 Other chronic pain: Secondary | ICD-10-CM

## 2019-05-14 DIAGNOSIS — Z981 Arthrodesis status: Secondary | ICD-10-CM

## 2019-05-14 DIAGNOSIS — M25552 Pain in left hip: Secondary | ICD-10-CM | POA: Diagnosis not present

## 2019-05-14 DIAGNOSIS — M961 Postlaminectomy syndrome, not elsewhere classified: Secondary | ICD-10-CM | POA: Diagnosis not present

## 2019-05-14 NOTE — Progress Notes (Signed)
Pain Management Virtual Encounter Note - Virtual Visit via Telephone Telehealth (real-time audio visits between healthcare provider and patient).   Patient's Phone No. & Preferred Pharmacy:  [phone] (home); [phone] (mobile); (Preferred) [phone] [email or web address]  [id number] Lorina Rabon, [facility] [place] [address] [place] [address] Phone: [phone] Fax: [phone]    Pre-screening note:  Our staff contacted Abigail [name] and offered her an "in person", "face-to-face" appointment versus a telephone encounter. She indicated preferring the telephone encounter, at this time.   Reason for Virtual Visit: COVID-19*  Social distancing based on CDC and AMA recommendations.   I contacted Abigail [name] on 05/14/2019 via telephone.      I clearly identified myself as Abigail Santa, MD. I verified that I was speaking with the correct person using two identifiers (Name: Abigail [name], and date of birth: 1965-11-02).  Advanced Informed Consent I sought verbal advanced consent from Abigail [name] for virtual visit interactions. I informed Ms. Worster of possible security and privacy concerns, risks, and limitations associated with providing "not-in-person" medical evaluation and management services. I also informed Ms. Martine of the availability of "in-person" appointments. Finally, I informed her that there would be a charge for the virtual visit and that she could be  personally, fully or partially, financially responsible for it. Ms. Brindle expressed understanding and agreed to proceed.   Historic Elements   Abigail [name] is a 53 y.o. year old, female patient evaluated today after her last encounter by our practice on 05/01/2019. Abigail [name]  has a past medical history of Adrenal cortex insufficiency ([place]), Arthritis, Complication of anesthesia, Headache(784.0), Hypertension, and Syncope. She also  has a past surgical history that  includes left foot bone spur surgery (10 yrs ago); Abdominal hysterectomy (14 yrs ago); Lumbar laminectomy/decompression microdiscectomy (07/13/2012); Lumbar laminectomy/decompression microdiscectomy (Right, 03/07/2013); Hemi-microdiscectomy lumbar laminectomy level 1 (N/A, 09/28/2013); Breast biopsy (Left, 04/10/2015); Cholecystectomy; Eye surgery; Back surgery; and Anterior cervical decompression/discectomy fusion 4 level (N/A, 10/18/2018). [name] has a current medication list which includes the following prescription(s): acetaminophen, alprazolam, amitriptyline, amlodipine-olmesartan, atorvastatin, celecoxib, cholecalciferol, duloxetine, fluticasone, fremanezumab-vfrm, methocarbamol, metoprolol succinate, prednisone, pregabalin, rizatriptan, tizanidine, and hydrocortisone. She  reports that she has been smoking cigarettes. She has a 10.50 pack-year smoking history. She has never used smokeless tobacco. She reports current alcohol use. She reports current drug use. Frequency: 7.00 times per week. Drug: Marijuana. Abigail [name] has No Known Allergies.   HPI  Today, she is being contacted for worsening of previously known (established) problem   Increased pain going down left thigh, going down into antero-lateral distribution. Having increased pain low back pain and having radiating pain going down right leg.  Patient states that her left leg is worse.  She has been evaluated by orthopedics and she has had left hip injection performed as well as left greater trochanteric bursa injection performed which were not helpful and in fact made her pain worse for a short period of time.  She has been evaluated by neurosurgery and Dr. Cari Caraway recommended she consider a epidural steroid injection.  Given the patient's previous laminectomies at L5-S1, recommend caudal ESI.  Risks and benefits reviewed and patient would like to proceed.  Patient is endorsing significant benefit in regards to her knee pain status post  left knee genicular RFA.  She does have a postprocedural evaluation scheduled for later this month and will have further details regarding her post procedural pain relief from genicular RFA.  Patient also endorses  relief from trigger point injections and she is scheduled to have one at the end of August.  Hopefully we can do her trigger point injection and caudal ESI on the same day.    UDS:  Summary  Date Value Ref Range Status  12/26/2018 FINAL  Final    Comment:    ==================================================================== [facility] COMP DRUG ANALYSIS,UR ==================================================================== Specimen Alert Note:  Urinary creatinine is low; ability to detect some drugs may be compromised.  Interpret results with caution. ==================================================================== Test                             Result       Flag       Units Drug Present and Declared for Prescription Verification   Alprazolam                     182          EXPECTED   ng/mg creat    Source of alprazolam is a scheduled prescription medication.   Pregabalin                     PRESENT      EXPECTED   Amitriptyline                  PRESENT      EXPECTED   Nortriptyline                  PRESENT      EXPECTED    Nortriptyline is an expected metabolite of amitriptyline.   Duloxetine                     PRESENT      EXPECTED   Metoprolol                     PRESENT      EXPECTED Drug Present not Declared for Prescription Verification   Carboxy-THC                    2706         UNEXPECTED ng/mg creat    Carboxy-THC is a metabolite of tetrahydrocannabinol  (THC).    Source of [facility] is most commonly illicit, but THC is also present    in a scheduled prescription medication.   Tramadol                       2976         UNEXPECTED ng/mg creat   O-Desmethyltramadol            1482         UNEXPECTED ng/mg creat   N-Desmethyltramadol            3200          UNEXPECTED ng/mg creat    Source of tramadol is a prescription medication.    O-desmethyltramadol and N-desmethyltramadol are expected    metabolites of tramadol. Drug Absent but Declared for Prescription Verification   Tizanidine                     Not Detected UNEXPECTED    Tizanidine, as indicated in the declared medication list, is not    always detected even when used as directed.   Methocarbamol  Not Detected UNEXPECTED   Acetaminophen                  Not Detected UNEXPECTED    Acetaminophen, as indicated in the declared medication list, is    not always detected even when used as directed. ==================================================================== Test                      Result    Flag   Units      Ref Range   Creatinine              17        L      mg/dL      >=20 ==================================================================== Declared Medications:  The flagging and interpretation on this report are based on the  following declared medications.  Unexpected results may arise from  inaccuracies in the declared medications.  **Note: The testing scope of this panel includes these medications:  Alprazolam  Amitriptyline  Duloxetine  Methocarbamol  Metoprolol  Pregabalin  **Note: The testing scope of this panel does not include small to  moderate amounts of these reported medications:  Acetaminophen  Tizanidine  **Note: The testing scope of this panel does not include following  reported medications:  Amlodipine ([place]or)  Atorvastatin  Celecoxib  Cholecalciferol  Fluticasone  Fremanezumab  Hydrocortisone  Olmesartan (Azor) ==================================================================== For clinical consultation, please call [phone]. ====================================================================    Laboratory Chemistry Profile (12 mo)  Renal: 12/26/2018: BUN 12; BUN/Creatinine Ratio 14; Creatinine, Ser 0.87; GFR calc  Af Amer 90; GFR calc non Af Amer 78  Hepatic: 12/26/2018: Albumin 4.4; AST 13 Other: 12/26/2018: 25-Hydroxy, Vitamin D 48; 25-Hydroxy, Vitamin D-2 2.7; 25-Hydroxy, Vitamin D-3 45; CRP 1; Sed Rate 11; Vitamin B-12 393 Note: Above Lab results reviewed.  Assessment  The primary encounter diagnosis was Chronic radicular lumbar pain. Diagnoses of Postlaminectomy syndrome, lumbar (Bilateral L5/S1 [place]), Left hip pain, Lumbar radiculopathy, and S/P cervical spinal fusion (C2-C5 ACDF) were also pertinent to this visit.  Plan of Care  I am having Taejah L. Halliday maintain her ALPRAZolam, metoprolol succinate, amLODipine-olmesartan, amitriptyline, atorvastatin, celecoxib, DULoxetine, fluticasone, Cholecalciferol, Fremanezumab-vfrm, hydrocortisone, pregabalin, acetaminophen, methocarbamol, tiZANidine, predniSONE, and rizatriptan.  53 year old female with a history of bilateral L5-S1 lumbar laminectomies who presents with a chief complaint of left greater than right low back pain as well as left greater than right leg pain and hip pain that is gotten worse over the last couple of months.  Patient has been seen by orthopedics and has had left hip injection and left greater trochanteric bursa injection without significant benefit.  She is being referred here by Dr. Cari Caraway from neurosurgery for consideration of epidural steroid injection.  Given the patient's history of prior L5-S1 laminectomy with compromise of ligamentum flavum, from a safety standpoint, we discussed diagnostic caudal epidural steroid injection.  Risks and benefits of this procedure were reviewed and patient would like to proceed.  We will plan for sedation.  Future considerations: Thoracolumbar spinal cord stimulation.  Patient does have daily marijuana use and is on high-dose Xanax which may limit her candidacy from a psychological clearance standpoint.  Orders Placed This Encounter  Procedures  . Caudal Epidural Injection    Standing  Status:   Future    Standing Expiration Date:   06/14/2019    Scheduling Instructions:     Laterality: Midline     Level(s): Sacrococcygeal canal (Tailbone area)     Sedation: WITH  Scheduling Timeframe: As soon as pre-approved    Order Specific Question:   Where will this procedure be performed?    Answer:   [facility]    Follow-up plan:   Return for Procedure CAUDAL ESI WITH SEDATION.     s/p L GN RFA on 7/20-helpful and TPI 7/20; status post left hip injection in greater trochanteric bursa injection with orthopedics [facility]) not helpful.  History of L5-S1 laminectomies, plan for caudal ESI for chronic lumbar radicular pain left greater than right.    Recent Visits Date Type Provider Dept  04/30/19 Procedure visit Abigail Santa, MD [facility]  04/18/19 Office Visit Abigail Santa, MD [facility]  03/21/19 Procedure visit Abigail Santa, MD [facility]  Showing recent visits within past 90 days and meeting all other requirements   Today's Visits Date Type Provider Dept  05/14/19 Office Visit Abigail Santa, MD [facility]  Showing today's visits and meeting all other requirements   Future Appointments Date Type Provider Dept  05/28/19 Appointment Abigail Santa, MD [facility]  06/07/19 Appointment Abigail Santa, MD [facility]  Showing future appointments within next 90 days and meeting all other requirements   I discussed the assessment and treatment plan with the patient. The patient was provided an opportunity to ask questions and all were answered. The patient agreed with the plan and demonstrated an understanding of the instructions.  Patient advised to call back or seek an in-person evaluation if the symptoms or condition worsens.  Total duration of non-face-to-face encounter: 25 minutes.  Note by: Abigail Santa, MD Date: 05/14/2019; Time: 12:20 PM  Note: This dictation was prepared with Dragon  dictation. Any transcriptional errors that may result from this process are unintentional.  Disclaimer:  * Given the special circumstances of the COVID-19 pandemic, the federal government has announced that the Office for Civil Rights (OCR) will exercise its enforcement discretion and will not impose penalties on physicians using telehealth in the event of noncompliance with regulatory requirements under the [place] (HIPAA) in connection with the good faith provision of telehealth during the APOLI-10 national public health emergency. ([place])

## 2019-05-15 ENCOUNTER — Encounter: Payer: Self-pay | Admitting: Student in an Organized Health Care Education/Training Program

## 2019-05-28 ENCOUNTER — Ambulatory Visit (HOSPITAL_BASED_OUTPATIENT_CLINIC_OR_DEPARTMENT_OTHER): Payer: 59 | Admitting: Student in an Organized Health Care Education/Training Program

## 2019-05-28 ENCOUNTER — Other Ambulatory Visit: Payer: Self-pay

## 2019-05-28 ENCOUNTER — Encounter: Payer: Self-pay | Admitting: Student in an Organized Health Care Education/Training Program

## 2019-05-28 ENCOUNTER — Ambulatory Visit
Admission: RE | Admit: 2019-05-28 | Discharge: 2019-05-28 | Disposition: A | Discharge status: Home / Self Care | Payer: 59 | Source: Ambulatory Visit | Attending: Student in an Organized Health Care Education/Training Program | Admitting: Student in an Organized Health Care Education/Training Program

## 2019-05-28 VITALS — BP 102/81 | HR 67 | Temp 97.8°F | Resp 18 | Ht 65.0 in | Wt 167.0 lb

## 2019-05-28 DIAGNOSIS — Z9049 Acquired absence of other specified parts of digestive tract: Secondary | ICD-10-CM | POA: Diagnosis not present

## 2019-05-28 DIAGNOSIS — M7918 Myalgia, other site: Secondary | ICD-10-CM | POA: Diagnosis not present

## 2019-05-28 DIAGNOSIS — I509 Heart failure, unspecified: Secondary | ICD-10-CM | POA: Insufficient documentation

## 2019-05-28 DIAGNOSIS — F419 Anxiety disorder, unspecified: Secondary | ICD-10-CM | POA: Diagnosis not present

## 2019-05-28 DIAGNOSIS — M4322 Fusion of spine, cervical region: Secondary | ICD-10-CM | POA: Insufficient documentation

## 2019-05-28 DIAGNOSIS — M25561 Pain in right knee: Secondary | ICD-10-CM

## 2019-05-28 DIAGNOSIS — G8929 Other chronic pain: Secondary | ICD-10-CM

## 2019-05-28 DIAGNOSIS — M5416 Radiculopathy, lumbar region: Secondary | ICD-10-CM | POA: Insufficient documentation

## 2019-05-28 DIAGNOSIS — M1711 Unilateral primary osteoarthritis, right knee: Secondary | ICD-10-CM | POA: Insufficient documentation

## 2019-05-28 MED ORDER — LIDOCAINE HCL 2 % IJ SOLN
20.0000 mL | Freq: Once | INTRAMUSCULAR | Status: AC
  Administered 2019-05-28: 400 mg

## 2019-05-28 MED ORDER — ROPIVACAINE HCL 2 MG/ML IJ SOLN
2.0000 mL | Freq: Once | INTRAMUSCULAR | Status: AC
  Administered 2019-05-28: 10 mL via EPIDURAL

## 2019-05-28 MED ORDER — ROPIVACAINE HCL 2 MG/ML IJ SOLN
1.0000 mL | Freq: Once | INTRAMUSCULAR | Status: AC
  Administered 2019-05-28: 10 mL via EPIDURAL
  Filled 2019-05-28: qty 10

## 2019-05-28 MED ORDER — DEXAMETHASONE SODIUM PHOSPHATE 10 MG/ML IJ SOLN
INTRAMUSCULAR | Status: AC
  Filled 2019-05-28: qty 1

## 2019-05-28 MED ORDER — FENTANYL CITRATE (PF) 100 MCG/2ML IJ SOLN: 25.0000 ug | INTRAMUSCULAR | Status: DC | PRN

## 2019-05-28 MED ORDER — LIDOCAINE HCL 2 % IJ SOLN
INTRAMUSCULAR | Status: AC
  Filled 2019-05-28: qty 20

## 2019-05-28 MED ORDER — ROPIVACAINE HCL 2 MG/ML IJ SOLN
INTRAMUSCULAR | Status: AC
  Filled 2019-05-28: qty 10

## 2019-05-28 MED ORDER — SODIUM CHLORIDE (PF) 0.9 % IJ SOLN
INTRAMUSCULAR | Status: AC
  Filled 2019-05-28: qty 10

## 2019-05-28 MED ORDER — IOHEXOL 180 MG/ML  SOLN
10.0000 mL | Freq: Once | INTRAMUSCULAR | Status: AC
  Administered 2019-05-28: 10 mL via EPIDURAL

## 2019-05-28 MED ORDER — FENTANYL CITRATE (PF) 100 MCG/2ML IJ SOLN
INTRAMUSCULAR | Status: AC
  Filled 2019-05-28: qty 2

## 2019-05-28 MED ORDER — DEXAMETHASONE SODIUM PHOSPHATE 10 MG/ML IJ SOLN
10.0000 mg | Freq: Once | INTRAMUSCULAR | Status: AC
  Administered 2019-05-28: 10 mg

## 2019-05-28 MED ORDER — SODIUM CHLORIDE 0.9% FLUSH
1.0000 mL | Freq: Once | INTRAVENOUS | Status: AC
  Administered 2019-05-28: 10 mL

## 2019-05-28 NOTE — Patient Instructions (Signed)

## 2019-05-28 NOTE — Progress Notes (Signed)
Patient's Name: Abigail [name]  MRN: [id number]  Referring Provider: Gillis Santa, MD  DOB: 1966/03/19  PCP: Aletha Halim., PA-C  DOS: 05/28/2019  Note by: Gillis Santa, MD  Service setting: Ambulatory outpatient  Specialty: Interventional Pain Management  Patient type: Established  Location: [facility] (AMB) Pain Management Facility  Visit type: Interventional Procedure   Primary Reason for Visit: Interventional Pain Management Treatment. CC: Hip Pain (bilateral) and Leg Pain (bilateral anterior thighs)  Procedure:          Anesthesia, Analgesia, Anxiolysis:  Type: Diagnostic Epidural Steroid Injection #1  Region: Caudal Level: Sacrococcygeal   Laterality: Midline       Type: Moderate (Conscious) Sedation combined with Local Anesthesia Indication(s): Analgesia and Anxiety Route: Intravenous (IV) IV Access: Secured Sedation: Meaningful verbal contact was maintained at all times during the procedure  Local Anesthetic: Lidocaine 1-2%  Position: Prone   Indications: 1. Lumbar radiculopathy   2. Cervical myofascial pain syndrome   3. Chronic pain of right knee   4. Primary osteoarthritis of right knee   5. Myofascial pain syndrome    Pain Score: Pre-procedure: 8 /10 Post-procedure: 4 (neck=4 , caudal =5)/10   Pre-op Assessment:  Abigail [name] is a 53 y.o. (year old), female patient, seen today for interventional treatment. She  has a past surgical history that includes left foot bone spur surgery (10 yrs ago); Abdominal hysterectomy (14 yrs ago); Lumbar laminectomy/decompression microdiscectomy (07/13/2012); Lumbar laminectomy/decompression microdiscectomy (Right, 03/07/2013); Hemi-microdiscectomy lumbar laminectomy level 1 (N/A, 09/28/2013); Breast biopsy (Left, 04/10/2015); Cholecystectomy; Eye surgery; Back surgery; and Anterior cervical decompression/discectomy fusion 4 level (N/A, 10/18/2018). Abigail [name] has a current medication list which includes the following prescription(s):  acetaminophen, alprazolam, amitriptyline, amlodipine-olmesartan, atorvastatin, celecoxib, cholecalciferol, duloxetine, fluticasone, fremanezumab-vfrm, methocarbamol, metoprolol succinate, prednisone, pregabalin, rizatriptan, tizanidine, and hydrocortisone, and the following Facility-Administered Medications: fentanyl. Her primarily concern today is the Hip Pain (bilateral) and Leg Pain (bilateral anterior thighs)  Initial Vital Signs:  Pulse/HCG Rate: 67ECG Heart Rate: 66 Temp: 98.1 F (36.7 C) Resp: 16 BP: 107/75 SpO2: 98 %  BMI: Estimated body mass index is 27.79 kg/m as calculated from the following:   Height as of this encounter: 5\' 5"  (1.651 m).   Weight as of this encounter: 167 lb (75.8 kg).  Risk Assessment: Allergies: Reviewed. She has No Known Allergies.  Allergy Precautions: None required Coagulopathies: Reviewed. None identified.  Blood-thinner therapy: None at this time Active Infection(s): Reviewed. None identified. Abigail [name] is afebrile  Site Confirmation: Abigail [name] was asked to confirm the procedure and laterality before marking the site Procedure checklist: Completed Consent: Before the procedure and under the influence of no sedative(s), amnesic(s), or anxiolytics, the patient was informed of the treatment options, risks and possible complications. To fulfill our ethical and legal obligations, as recommended by the American Medical Association's Code of Ethics, I have informed the patient of my clinical impression; the nature and purpose of the treatment or procedure; the risks, benefits, and possible complications of the intervention; the alternatives, including doing nothing; the risk(s) and benefit(s) of the alternative treatment(s) or procedure(s); and the risk(s) and benefit(s) of doing nothing. The patient was provided information about the general risks and possible complications associated with the procedure. These may include, but are not limited to: failure  to achieve desired goals, infection, bleeding, organ or nerve damage, allergic reactions, paralysis, and death. In addition, the patient was informed of those risks and complications associated to Spine-related procedures, such as failure to decrease pain; infection (  i.e.: Meningitis, epidural or intraspinal abscess); bleeding (i.e.: epidural hematoma, subarachnoid hemorrhage, or any other type of intraspinal or peri-dural bleeding); organ or nerve damage (i.e.: Any type of peripheral nerve, nerve root, or spinal cord injury) with subsequent damage to sensory, motor, and/or autonomic systems, resulting in permanent pain, numbness, and/or weakness of one or several areas of the body; allergic reactions; (i.e.: anaphylactic reaction); and/or death. Furthermore, the patient was informed of those risks and complications associated with the medications. These include, but are not limited to: allergic reactions (i.e.: anaphylactic or anaphylactoid reaction(s)); adrenal axis suppression; blood sugar elevation that in diabetics may result in ketoacidosis or comma; water retention that in patients with history of congestive heart failure may result in shortness of breath, pulmonary edema, and decompensation with resultant heart failure; weight gain; swelling or edema; medication-induced neural toxicity; particulate matter embolism and blood vessel occlusion with resultant organ, and/or nervous system infarction; and/or aseptic necrosis of one or more joints. Finally, the patient was informed that Medicine is not an exact science; therefore, there is also the possibility of unforeseen or unpredictable risks and/or possible complications that may result in a catastrophic outcome. The patient indicated having understood very clearly. We have given the patient no guarantees and we have made no promises. Enough time was given to the patient to ask questions, all of which were answered to the patient's satisfaction. [name] has indicated that she wanted to continue with the procedure. Attestation: I, the ordering provider, attest that I have discussed with the patient the benefits, risks, side-effects, alternatives, likelihood of achieving goals, and potential problems during recovery for the procedure that I have provided informed consent. Date  Time: 05/28/2019 10:23 AM  Pre-Procedure Preparation:  Monitoring: As per clinic protocol. Respiration, ETCO2, SpO2, BP, heart rate and rhythm monitor placed and checked for adequate function Safety Precautions: Patient was assessed for positional comfort and pressure points before starting the procedure. Time-out: I initiated and conducted the "Time-out" before starting the procedure, as per protocol. The patient was asked to participate by confirming the accuracy of the "Time Out" information. Verification of the correct person, site, and procedure were performed and confirmed by me, the nursing staff, and the patient. "Time-out" conducted as per Joint Commission's Universal Protocol (UP.01.01.01). Time: 1126  Description of Procedure:          Target Area: Caudal Epidural Canal. Approach: Midline approach. Area Prepped: Entire Posterior Sacrococcygeal Region Prepping solution: DuraPrep (Iodine Povacrylex [0.7% available iodine] and Isopropyl Alcohol, 74% w/w) Safety Precautions: Aspiration looking for blood return was conducted prior to all injections. At no point did we inject any substances, as a needle was being advanced. No attempts were made at seeking any paresthesias. Safe injection practices and needle disposal techniques used. Medications properly checked for expiration dates. SDV (single dose vial) medications used. Description of the Procedure: Protocol guidelines were followed. The patient was placed in position over the fluoroscopy table. The target area was identified and the area prepped in the usual manner. Skin & deeper tissues infiltrated with  local anesthetic. Appropriate amount of time allowed to pass for local anesthetics to take effect. The procedure needles were then advanced to the target area. Proper needle placement secured. Negative aspiration confirmed. Solution injected in intermittent fashion, asking for systemic symptoms every 0.5cc of injectate. The needles were then removed and the area cleansed, making sure to leave some of the prepping solution back to take advantage of its long term bactericidal properties. Vitals:  05/28/19 1140 05/28/19 1150 05/28/19 1159 05/28/19 1209  BP: 116/84 107/71 92/71 102/81  Pulse:      Resp: 18 16 17 18   Temp:  97.8 F (36.6 C)    TempSrc:      SpO2: 99% 98% 100% 98%  Weight:      Height:        Start Time: 1136 hrs. End Time: 1139 hrs. Materials:  Needle(s) Type: 22-gauge 3-1/2 inch spinal needle Gauge: 22G Length: 3.5-in Medication(s): Please see orders for medications and dosing details. 8 cc solution made of 5 cc of preservative-free saline, 2 cc of 0.2% ropivacaine, 1 cc of Decadron 10 mg/cc.  Imaging Guidance (Spinal):          Type of Imaging Technique: Fluoroscopy Guidance (Spinal) Indication(s): Assistance in needle guidance and placement for procedures requiring needle placement in or near specific anatomical locations not easily accessible without such assistance. Exposure Time: Please see nurses notes. Contrast: Before injecting any contrast, we confirmed that the patient did not have an allergy to iodine, shellfish, or radiological contrast. Once satisfactory needle placement was completed at the desired level, radiological contrast was injected. Contrast injected under live fluoroscopy. No contrast complications. See chart for type and volume of contrast used. Fluoroscopic Guidance: I was personally present during the use of fluoroscopy. "Tunnel Vision Technique" used to obtain the best possible view of the target area. Parallax error corrected before commencing the  procedure. "Direction-depth-direction" technique used to introduce the needle under continuous pulsed fluoroscopy. Once target was reached, antero-posterior, oblique, and lateral fluoroscopic projection used confirm needle placement in all planes. Images permanently stored in EMR. Interpretation: I personally interpreted the imaging intraoperatively. Adequate needle placement confirmed in multiple planes. Appropriate spread of contrast into desired area was observed. No evidence of afferent or efferent intravascular uptake. No intrathecal or subarachnoid spread observed. Permanent images saved into the patient's record.  Antibiotic Prophylaxis:   Anti-infectives (From admission, onward)   None     Indication(s): None identified  Post-operative Assessment:  Post-procedure Vital Signs:  Pulse/HCG Rate: 6764 Temp: 97.8 F (36.6 C) Resp: 18 BP: 102/81 SpO2: 98 %  EBL: None  Complications: No immediate post-treatment complications observed by team, or reported by patient.  Note: The patient tolerated the entire procedure well. A repeat set of vitals were taken after the procedure and the patient was kept under observation following institutional policy, for this type of procedure. Post-procedural neurological assessment was performed, showing return to baseline, prior to discharge. The patient was provided with post-procedure discharge instructions, including a section on how to identify potential problems. Should any problems arise concerning this procedure, the patient was given instructions to immediately contact us, at any time, without hesitation. In any case, we plan to contact the patient by telephone for a follow-up status report regarding this interventional procedure.  Comments:  No additional relevant information.  Plan of Care  Orders:  Orders Placed This Encounter  Procedures  . GENICULAR NERVE BLOCK    Indication(s):  Sub-acute knee pain    Standing Status:   Future     Standing Expiration Date:   06/27/2019    Scheduling Instructions:     Side: RIGHT     Sedation: With Sedation.     Timeframe: As soon as schedule allows    Order Specific Question:   Where will this procedure be performed?    Answer:   [facility]  . TRIGGER POINT INJECTION    Standing Status:  Future    Standing Expiration Date:   06/27/2019    Scheduling Instructions:     Area: Lower Extremity     Side: Bilateral    Order Specific Question:   Where will this procedure be performed?    Answer:   [facility]  . Knee brace  . DG PAIN CLINIC C-ARM 1-60 MIN NO REPORT    Intraoperative interpretation by procedural physician at [place].    Standing Status:   Standing    Number of Occurrences:   1    Order Specific Question:   Reason for exam:    Answer:   Assistance in needle guidance and placement for procedures requiring needle placement in or near specific anatomical locations not easily accessible without such assistance.   Medications ordered for procedure: Meds ordered this encounter  Medications  . iohexol (OMNIPAQUE) 180 MG/ML injection 10 mL    Must be Myelogram-compatible. If not available, you may substitute with a water-soluble, non-ionic, hypoallergenic, myelogram-compatible radiological contrast medium.  Marland Kitchen lidocaine (XYLOCAINE) 2 % (with pres) injection 400 mg  . ropivacaine (PF) 2 mg/mL (0.2%) (NAROPIN) injection 1 mL  . dexamethasone (DECADRON) injection 10 mg  . sodium chloride flush (NS) 0.9 % injection 1 mL  . ropivacaine (PF) 2 mg/mL (0.2%) (NAROPIN) injection 2 mL  . fentaNYL (SUBLIMAZE) injection 25-50 mcg    Make sure Narcan is available in the pyxis when using this medication. In the event of respiratory depression (RR< 8/min): Titrate NARCAN (naloxone) in increments of 0.1 to 0.2 mg IV at 2-3 minute intervals, until desired degree of reversal.   Medications administered: We administered iohexol, lidocaine, ropivacaine (PF) 2  mg/mL (0.2%), dexamethasone, sodium chloride flush, and ropivacaine (PF) 2 mg/mL (0.2%).  See the medical record for exact dosing, route, and time of administration.  Follow-up plan:   Return in about 2 weeks (around 06/11/2019) for Procedure (Right KNEE GNB + TPI) 30 mins.      s/p L GN RFA on 7/20-helpful and TPI 7/20; status post left hip injection in greater trochanteric bursa injection with orthopedics [facility]) not helpful.  History of L5-S1 laminectomies, status post caudal ESI as well as cervical TPI on 05/28/2019 plan for right knee genicular nerve block and left knee TPI at next visit    Recent Visits Date Type Provider Dept  05/14/19 Office Visit Gillis Santa, MD [facility]  04/30/19 Procedure visit Gillis Santa, MD [facility]  04/18/19 Office Visit Gillis Santa, MD [facility]  03/21/19 Procedure visit Gillis Santa, MD [facility]  Showing recent visits within past 90 days and meeting all other requirements   Today's Visits Date Type Provider Dept  05/28/19 Procedure visit Gillis Santa, MD [facility]  Showing today's visits and meeting all other requirements   Future Appointments Date Type Provider Dept  06/07/19 Appointment Gillis Santa, MD [facility]  06/11/19 Appointment Gillis Santa, MD [facility]  Showing future appointments within next 90 days and meeting all other requirements   Disposition: Discharge home  Discharge Date & Time: 05/28/2019; 1209 hrs.   Primary Care Physician: Aletha Halim PA-C Location: [facility] Note by: Gillis Santa, MD Date: 05/28/2019; Time: 12:27 PM  Disclaimer:  Medicine is not an exact science. The only guarantee in medicine is that nothing is guaranteed. It is important to note that the decision to proceed with this intervention was based on the information collected from  the patient. The Data and conclusions were  drawn from the patient's questionnaire, the interview, and the physical examination. Because the information was provided in large part by the patient, it cannot be guaranteed that it has not been purposely or unconsciously manipulated. Every effort has been made to obtain as much relevant data as possible for this evaluation. It is important to note that the conclusions that lead to this procedure are derived in large part from the available data. Always take into account that the treatment will also be dependent on availability of resources and existing treatment guidelines, considered by other Pain Management Practitioners as being common knowledge and practice, at the time of the intervention. For Medico-Legal purposes, it is also important to point out that variation in procedural techniques and pharmacological choices are the acceptable norm. The indications, contraindications, technique, and results of the above procedure should only be interpreted and judged by a Board-Certified Interventional Pain Specialist with extensive familiarity and expertise in the same exact procedure and technique.

## 2019-05-28 NOTE — Progress Notes (Signed)
118 81Safety precautions to be maintained throughout the outpatient stay will include: orient to surroundings, keep bed in low position, maintain call bell within reach at all times, provide assistance with transfer out of bed and ambulation.

## 2019-05-28 NOTE — Progress Notes (Signed)
Patient's Name: Abigail [name]  MRN: [id number]  Referring Provider: Gillis Santa, MD  DOB: 27-Apr-1966  PCP: Aletha Halim., PA-C  DOS: 05/28/2019  Note by: Gillis Santa, MD  Service setting: Ambulatory outpatient  Specialty: Interventional Pain Management  Patient type: Established  Location: [facility] (AMB) Pain Management Facility  Visit type: Interventional Procedure   Primary Reason for Visit: Interventional Pain Management Treatment. CC: Hip Pain (bilateral) and Leg Pain (bilateral anterior thighs)  Procedure:          Anesthesia, Analgesia, Anxiolysis:  Type:  Trigger Point Injection (3+ muscle groups)          CPT: 20553 Primary Purpose: Therapeutic Region: Upper Cervicothoracic Level: Cervico-thoracic Approach: Percutaneous, ipsilateral approach. Laterality: Bilateral        Type: Local Anesthesia   Position: Prone   Indications: Cervical/thoracic myofascial pain syndrome  Pain Score: Pre-procedure: 8 /10 Post-procedure: 4 (neck=4 , caudal =5)/10   Pre-op Assessment:  Abigail [name] is a 53 y.o. (year old), female patient, seen today for interventional treatment. She  has a past surgical history that includes left foot bone spur surgery (10 yrs ago); Abdominal hysterectomy (14 yrs ago); Lumbar laminectomy/decompression microdiscectomy (07/13/2012); Lumbar laminectomy/decompression microdiscectomy (Right, 03/07/2013); Hemi-microdiscectomy lumbar laminectomy level 1 (N/A, 09/28/2013); Breast biopsy (Left, 04/10/2015); Cholecystectomy; Eye surgery; Back surgery; and Anterior cervical decompression/discectomy fusion 4 level (N/A, 10/18/2018). Abigail [name] has a current medication list which includes the following prescription(s): acetaminophen, alprazolam, amitriptyline, amlodipine-olmesartan, atorvastatin, celecoxib, cholecalciferol, duloxetine, fluticasone, fremanezumab-vfrm, methocarbamol, metoprolol succinate, prednisone, pregabalin, rizatriptan, tizanidine, and hydrocortisone, and the  following Facility-Administered Medications: fentanyl. Her primarily concern today is the Hip Pain (bilateral) and Leg Pain (bilateral anterior thighs)  Initial Vital Signs:  Pulse/HCG Rate: 67ECG Heart Rate: 66 Temp: 98.1 F (36.7 C) Resp: 16 BP: 107/75 SpO2: 98 %  BMI: Estimated body mass index is 27.79 kg/m as calculated from the following:   Height as of this encounter: 5\' 5"  (1.651 m).   Weight as of this encounter: 167 lb (75.8 kg).  Risk Assessment: Allergies: Reviewed. She has No Known Allergies.  Allergy Precautions: None required Coagulopathies: Reviewed. None identified.  Blood-thinner therapy: None at this time Active Infection(s): Reviewed. None identified. Abigail [name] is afebrile  Site Confirmation: Abigail [name] was asked to confirm the procedure and laterality before marking the site Procedure checklist: Completed Consent: Before the procedure and under the influence of no sedative(s), amnesic(s), or anxiolytics, the patient was informed of the treatment options, risks and possible complications. To fulfill our ethical and legal obligations, as recommended by the American Medical Association's Code of Ethics, I have informed the patient of my clinical impression; the nature and purpose of the treatment or procedure; the risks, benefits, and possible complications of the intervention; the alternatives, including doing nothing; the risk(s) and benefit(s) of the alternative treatment(s) or procedure(s); and the risk(s) and benefit(s) of doing nothing. The patient was provided information about the general risks and possible complications associated with the procedure. These may include, but are not limited to: failure to achieve desired goals, infection, bleeding, organ or nerve damage, allergic reactions, paralysis, and death. In addition, the patient was informed of those risks and complications associated to the procedure, such as failure to decrease pain; infection;  bleeding; organ or nerve damage with subsequent damage to sensory, motor, and/or autonomic systems, resulting in permanent pain, numbness, and/or weakness of one or several areas of the body; allergic reactions; (i.e.: anaphylactic reaction); and/or death. Furthermore, the patient was informed of  those risks and complications associated with the medications. These include, but are not limited to: allergic reactions (i.e.: anaphylactic or anaphylactoid reaction(s)); adrenal axis suppression; blood sugar elevation that in diabetics may result in ketoacidosis or comma; water retention that in patients with history of congestive heart failure may result in shortness of breath, pulmonary edema, and decompensation with resultant heart failure; weight gain; swelling or edema; medication-induced neural toxicity; particulate matter embolism and blood vessel occlusion with resultant organ, and/or nervous system infarction; and/or aseptic necrosis of one or more joints. Finally, the patient was informed that Medicine is not an exact science; therefore, there is also the possibility of unforeseen or unpredictable risks and/or possible complications that may result in a catastrophic outcome. The patient indicated having understood very clearly. We have given the patient no guarantees and we have made no promises. Enough time was given to the patient to ask questions, all of which were answered to the patient's satisfaction. Abigail [name] has indicated that she wanted to continue with the procedure. Attestation: I, the ordering provider, attest that I have discussed with the patient the benefits, risks, side-effects, alternatives, likelihood of achieving goals, and potential problems during recovery for the procedure that I have provided informed consent. Date  Time: 05/28/2019 10:23 AM  Pre-Procedure Preparation:  Monitoring: As per clinic protocol. Respiration, ETCO2, SpO2, BP, heart rate and rhythm monitor placed and  checked for adequate function Safety Precautions: Patient was assessed for positional comfort and pressure points before starting the procedure. Time-out: I initiated and conducted the "Time-out" before starting the procedure, as per protocol. The patient was asked to participate by confirming the accuracy of the "Time Out" information. Verification of the correct person, site, and procedure were performed and confirmed by me, the nursing staff, and the patient. "Time-out" conducted as per Joint Commission's Universal Protocol (UP.01.01.01). Time: 1126  Description of Procedure:          Area Prepped: Entire Upper Cervicothoracic Region Prepping solution: DuraPrep (Iodine Povacrylex [0.7% available iodine] and Isopropyl Alcohol, 74% w/w) Safety Precautions: Aspiration looking for blood return was conducted prior to all injections. At no point did we inject any substances, as a needle was being advanced. No attempts were made at seeking any paresthesias. Safe injection practices and needle disposal techniques used. Medications properly checked for expiration dates. SDV (single dose vial) medications used. Description of the Procedure: Protocol guidelines were followed. The patient was placed in position over the fluoroscopy table. The target area was identified and the area prepped in the usual manner. Skin & deeper tissues infiltrated with local anesthetic. Appropriate amount of time allowed to pass for local anesthetics to take effect. The procedure needles were then advanced to the target area. Proper needle placement secured. Negative aspiration confirmed. Solution injected in intermittent fashion, asking for systemic symptoms every 0.5cc of injectate. The needles were then removed and the area cleansed, making sure to leave some of the prepping solution back to take advantage of its long term bactericidal properties.  Vitals:   05/28/19 1140 05/28/19 1150 05/28/19 1159 05/28/19 1209  BP: 116/84  107/71 92/71 102/81  Pulse:      Resp: 18 16 17 18   Temp:  97.8 F (36.6 C)    TempSrc:      SpO2: 99% 98% 100% 98%  Weight:      Height:        Start Time: 1136 hrs. End Time: 1139 hrs. Materials:  Needle(s) Type: Regular needle Gauge: 25G Length: 1.5-in  Medication(s): Please see orders for medications and dosing details. Approximately 18 trigger points in the cervical/thoracic/periscapular region injected with 0.5 to 1 cc of 0.2% ropivacaine.  Dry needling performed at each trigger point. Imaging Guidance:          Type of Imaging Technique: None used Indication(s): N/A Exposure Time: No patient exposure Contrast: None used. Fluoroscopic Guidance: N/A Ultrasound Guidance: N/A Interpretation: N/A  Antibiotic Prophylaxis:   Anti-infectives (From admission, onward)   None     Indication(s): None identified  Post-operative Assessment:  Post-procedure Vital Signs:  Pulse/HCG Rate: 6764 Temp: 97.8 F (36.6 C) Resp: 18 BP: 102/81 SpO2: 98 %  EBL: None  Complications: No immediate post-treatment complications observed by team, or reported by patient.  Note: The patient tolerated the entire procedure well. A repeat set of vitals were taken after the procedure and the patient was kept under observation following institutional policy, for this type of procedure. Post-procedural neurological assessment was performed, showing return to baseline, prior to discharge. The patient was provided with post-procedure discharge instructions, including a section on how to identify potential problems. Should any problems arise concerning this procedure, the patient was given instructions to immediately contact us, at any time, without hesitation. In any case, we plan to contact the patient by telephone for a follow-up status report regarding this interventional procedure.  Comments:  No additional relevant information.  Plan of Care  Orders:  Orders Placed This Encounter  Procedures   . GENICULAR NERVE BLOCK    Indication(s):  Sub-acute knee pain    Standing Status:   Future    Standing Expiration Date:   06/27/2019    Scheduling Instructions:     Side: RIGHT     Sedation: With Sedation.     Timeframe: As soon as schedule allows    Order Specific Question:   Where will this procedure be performed?    Answer:   [facility]  . TRIGGER POINT INJECTION    Standing Status:   Future    Standing Expiration Date:   06/27/2019    Scheduling Instructions:     Area: Lower Extremity     Side: Bilateral    Order Specific Question:   Where will this procedure be performed?    Answer:   [facility]  . Knee brace  . DG PAIN CLINIC C-ARM 1-60 MIN NO REPORT    Intraoperative interpretation by procedural physician at [place].    Standing Status:   Standing    Number of Occurrences:   1    Order Specific Question:   Reason for exam:    Answer:   Assistance in needle guidance and placement for procedures requiring needle placement in or near specific anatomical locations not easily accessible without such assistance.   Medications ordered for procedure: Meds ordered this encounter  Medications  . iohexol (OMNIPAQUE) 180 MG/ML injection 10 mL    Must be Myelogram-compatible. If not available, you may substitute with a water-soluble, non-ionic, hypoallergenic, myelogram-compatible radiological contrast medium.  Marland Kitchen lidocaine (XYLOCAINE) 2 % (with pres) injection 400 mg  . ropivacaine (PF) 2 mg/mL (0.2%) (NAROPIN) injection 1 mL  . dexamethasone (DECADRON) injection 10 mg  . sodium chloride flush (NS) 0.9 % injection 1 mL  . ropivacaine (PF) 2 mg/mL (0.2%) (NAROPIN) injection 2 mL  . fentaNYL (SUBLIMAZE) injection 25-50 mcg    Make sure Narcan is available in the pyxis when using this medication. In the event of respiratory depression (  RR< 8/min): Titrate NARCAN (naloxone) in increments of 0.1 to 0.2 mg IV at 2-3 minute intervals, until desired degree  of reversal.   Medications administered: We administered iohexol, lidocaine, ropivacaine (PF) 2 mg/mL (0.2%), dexamethasone, sodium chloride flush, and ropivacaine (PF) 2 mg/mL (0.2%).  See the medical record for exact dosing, route, and time of administration.  Follow-up plan:   Return in about 2 weeks (around 06/11/2019) for Procedure (Right KNEE GNB + TPI) 30 mins.      s/p L GN RFA on 7/20-helpful and TPI 7/20; status post left hip injection in greater trochanteric bursa injection with orthopedics [facility]) not helpful.  History of L5-S1 laminectomies, status post caudal ESI as well as cervical TPI on 05/28/2019 plan for right knee genicular nerve block and left knee TPI at next visit       Recent Visits Date Type Provider Dept  05/14/19 Office Visit Gillis Santa, MD [facility]  04/30/19 Procedure visit Gillis Santa, MD [facility]  04/18/19 Office Visit Gillis Santa, MD [facility]  03/21/19 Procedure visit Gillis Santa, MD [facility]  Showing recent visits within past 90 days and meeting all other requirements   Today's Visits Date Type Provider Dept  05/28/19 Procedure visit Gillis Santa, MD [facility]  Showing today's visits and meeting all other requirements   Future Appointments Date Type Provider Dept  06/07/19 Appointment Gillis Santa, MD [facility]  06/11/19 Appointment Gillis Santa, MD [facility]  Showing future appointments within next 90 days and meeting all other requirements   Disposition: Discharge home  Discharge Date & Time: 05/28/2019; 1209 hrs.   Primary Care Physician: Aletha Halim PA-C Location: [facility] Note by: Gillis Santa, MD Date: 05/28/2019; Time: 12:32 PM  Disclaimer:  Medicine is not an exact science. The only guarantee in medicine is that nothing is guaranteed. It is important to note that the decision to proceed with this  intervention was based on the information collected from the patient. The Data and conclusions were drawn from the patient's questionnaire, the interview, and the physical examination. Because the information was provided in large part by the patient, it cannot be guaranteed that it has not been purposely or unconsciously manipulated. Every effort has been made to obtain as much relevant data as possible for this evaluation. It is important to note that the conclusions that lead to this procedure are derived in large part from the available data. Always take into account that the treatment will also be dependent on availability of resources and existing treatment guidelines, considered by other Pain Management Practitioners as being common knowledge and practice, at the time of the intervention. For Medico-Legal purposes, it is also important to point out that variation in procedural techniques and pharmacological choices are the acceptable norm. The indications, contraindications, technique, and results of the above procedure should only be interpreted and judged by a Board-Certified Interventional Pain Specialist with extensive familiarity and expertise in the same exact procedure and technique.

## 2019-05-29 ENCOUNTER — Telehealth: Payer: Self-pay | Admitting: *Deleted

## 2019-05-29 NOTE — Telephone Encounter (Signed)
No problems post procedure. 

## 2019-06-05 ENCOUNTER — Encounter: Payer: Self-pay | Admitting: Student in an Organized Health Care Education/Training Program

## 2019-06-07 ENCOUNTER — Other Ambulatory Visit: Payer: Self-pay

## 2019-06-07 ENCOUNTER — Ambulatory Visit
Payer: 59 | Attending: Student in an Organized Health Care Education/Training Program | Admitting: Student in an Organized Health Care Education/Training Program

## 2019-06-07 DIAGNOSIS — G8929 Other chronic pain: Secondary | ICD-10-CM | POA: Diagnosis not present

## 2019-06-07 DIAGNOSIS — M25562 Pain in left knee: Secondary | ICD-10-CM

## 2019-06-07 DIAGNOSIS — M1712 Unilateral primary osteoarthritis, left knee: Secondary | ICD-10-CM

## 2019-06-07 NOTE — Progress Notes (Signed)
Pain Management Virtual Encounter Note - Virtual Visit via [place] (real-time audio visits between healthcare provider and patient).   Patient's Phone No. & Preferred Pharmacy:  [phone] (home); [phone] (mobile); (Preferred) [phone] [email or web address]  [facility][id number] Lorina Rabon, [facility] [address] [place] [address] Phone: [phone] Fax: [phone]    Pre-screening note:  Our staff contacted Ms. Beyea and offered her an "in person", "face-to-face" appointment versus a telephone encounter. She indicated preferring the telephone encounter, at this time.   Reason for Virtual Visit: COVID-19*  Social distancing based on CDC and AMA recommendations.   I contacted Joseph Pierini on 06/07/2019 via video conference.      I clearly identified myself as Gillis Santa, MD. I verified that I was speaking with the correct person using two identifiers (Name: FRANCEE STANLEY, and date of birth: Jun 05, 1966).  Advanced Informed Consent I sought verbal advanced consent from Joseph Pierini for virtual visit interactions. I informed Ms. Tiller of possible security and privacy concerns, risks, and limitations associated with providing "not-in-person" medical evaluation and management services. I also informed Ms. Corrales of the availability of "in-person" appointments. Finally, I informed her that there would be a charge for the virtual visit and that she could be  personally, fully or partially, financially responsible for it. Ms. Cervin expressed understanding and agreed to proceed.   Historic Elements   Ms. MONETTA KOSIBA is a 53 y.o. year old, female patient evaluated today after her last encounter by our practice on 05/29/2019. Ms. [name]  has a past medical history of Adrenal cortex insufficiency ([place]), Arthritis, Complication of anesthesia, Headache(784.0), Hypertension, and Syncope. She also  has a past surgical  history that includes left foot bone spur surgery (10 yrs ago); Abdominal hysterectomy (14 yrs ago); Lumbar laminectomy/decompression microdiscectomy (07/13/2012); Lumbar laminectomy/decompression microdiscectomy (Right, 03/07/2013); Hemi-microdiscectomy lumbar laminectomy level 1 (N/A, 09/28/2013); Breast biopsy (Left, 04/10/2015); Cholecystectomy; Eye surgery; Back surgery; and Anterior cervical decompression/discectomy fusion 4 level (N/A, 10/18/2018). Ms. Rooth has a current medication list which includes the following prescription(s): acetaminophen, alprazolam, amitriptyline, amlodipine-olmesartan, atorvastatin, celecoxib, cholecalciferol, duloxetine, fluticasone, fremanezumab-vfrm, hydrocortisone, methocarbamol, metoprolol succinate, prednisone, pregabalin, rizatriptan, and tizanidine. She  reports that she has been smoking cigarettes. She has a 10.50 pack-year smoking history. She has never used smokeless tobacco. She reports current alcohol use. She reports current drug use. Frequency: 7.00 times per week. Drug: Marijuana. Ms. Mccall has No Known Allergies.   HPI  Today, she is being contacted for a post-procedure assessment.  Evaluation of last interventional procedure  03/21/2019 procedure: Left knee genicular nerve block #1 Pre-procedure pain score:  8/10 Post-procedure pain score: 0/10         Influential Factors: Intra-procedural challenges: None observed.         Reported side-effects: None.        Post-procedural adverse reactions or complications: None reported         Sedation: Please see nurses note for DOS. When no sedatives are used, the analgesic levels obtained are directly associated to the effectiveness of the local anesthetics. However, when sedation is provided, the level of analgesia obtained during the initial 1 hour following the intervention, is believed to be the result of a combination of factors. These factors may include, but are not limited to: 1. The effectiveness of  the local anesthetics used. 2. The effects of the analgesic(s) and/or anxiolytic(s) used. 3. The degree of discomfort experienced by the patient at the time of  the procedure. 4. The patients ability and reliability in recalling and recording the events. 5. The presence and influence of possible secondary gains and/or psychosocial factors. Reported result: Relief experienced during the 1st hour after the procedure: 50 % (Ultra-Short Term Relief)            Interpretative annotation: Clinically appropriate result. Analgesia during this period is likely to be Local Anesthetic and/or IV Sedative (Analgesic/Anxiolytic) related.          Effects of local anesthetic: The analgesic effects attained during this period are directly associated to the localized infiltration of local anesthetics and therefore cary significant diagnostic value as to the etiological location, or anatomical origin, of the pain. Expected duration of relief is directly dependent on the pharmacodynamics of the local anesthetic used. Long-acting (4-6 hours) anesthetics used.  Reported result: Relief during the next 4 to 6 hour after the procedure: 50 % (Short-Term Relief)            Interpretative annotation: Clinically appropriate result. Analgesia during this period is likely to be Local Anesthetic-related.          Long-term benefit: Defined as the period of time past the expected duration of local anesthetics (1 hour for short-acting and 4-6 hours for long-acting). With the possible exception of prolonged sympathetic blockade from the local anesthetics, benefits during this period are typically attributed to, or associated with, other factors such as analgesic sensory neuropraxia, antiinflammatory effects, or beneficial biochemical changes provided by agents other than the local anesthetics.  Reported result: Extended relief following procedure: 80 % (Long-Term Relief)            Interpretative annotation: Clinically appropriate  result. Good relief. No permanent benefit expected. Inflammation plays a part in the etiology to the pain.          UDS:  Summary  Date Value Ref Range Status  12/26/2018 FINAL  Final    Comment:    ==================================================================== [facility] COMP DRUG ANALYSIS,UR ==================================================================== Specimen Alert Note:  Urinary creatinine is low; ability to detect some drugs may be compromised.  Interpret results with caution. ==================================================================== Test                             Result       Flag       Units Drug Present and Declared for Prescription Verification   Alprazolam                     182          EXPECTED   ng/mg creat    Source of alprazolam is a scheduled prescription medication.   Pregabalin                     PRESENT      EXPECTED   Amitriptyline                  PRESENT      EXPECTED   Nortriptyline                  PRESENT      EXPECTED    Nortriptyline is an expected metabolite of amitriptyline.   Duloxetine                     PRESENT      EXPECTED   Metoprolol  PRESENT      EXPECTED Drug Present not Declared for Prescription Verification   Carboxy-THC                    2706         UNEXPECTED ng/mg creat    Carboxy-THC is a metabolite of tetrahydrocannabinol  (THC).    Source of [facility] is most commonly illicit, but THC is also present    in a scheduled prescription medication.   Tramadol                       2976         UNEXPECTED ng/mg creat   O-Desmethyltramadol            1482         UNEXPECTED ng/mg creat   N-Desmethyltramadol            3200         UNEXPECTED ng/mg creat    Source of tramadol is a prescription medication.    O-desmethyltramadol and N-desmethyltramadol are expected    metabolites of tramadol. Drug Absent but Declared for Prescription Verification   Tizanidine                     Not Detected  UNEXPECTED    Tizanidine, as indicated in the declared medication list, is not    always detected even when used as directed.   Methocarbamol                  Not Detected UNEXPECTED   Acetaminophen                  Not Detected UNEXPECTED    Acetaminophen, as indicated in the declared medication list, is    not always detected even when used as directed. ==================================================================== Test                      Result    Flag   Units      Ref Range   Creatinine              17        L      mg/dL      >=20 ==================================================================== Declared Medications:  The flagging and interpretation on this report are based on the  following declared medications.  Unexpected results may arise from  inaccuracies in the declared medications.  **Note: The testing scope of this panel includes these medications:  Alprazolam  Amitriptyline  Duloxetine  Methocarbamol  Metoprolol  Pregabalin  **Note: The testing scope of this panel does not include small to  moderate amounts of these reported medications:  Acetaminophen  Tizanidine  **Note: The testing scope of this panel does not include following  reported medications:  Amlodipine (Azor)  Atorvastatin  Celecoxib  Cholecalciferol  Fluticasone  Fremanezumab  Hydrocortisone  Olmesartan (Azor) ==================================================================== For clinical consultation, please call [phone]. ====================================================================    Laboratory Chemistry Profile (12 mo)  Renal: 12/26/2018: BUN 12; BUN/Creatinine Ratio 14; Creatinine, Ser 0.87  Lab Results  Component Value Date   GFRAA 90 12/26/2018   GFRNONAA 78 12/26/2018   Hepatic: 12/26/2018: Albumin 4.4 Lab Results  Component Value Date   AST 13 12/26/2018   ALT 7 06/09/2014   Other: 12/26/2018: 25-Hydroxy, Vitamin D 48; 25-Hydroxy, Vitamin D-2 2.7;  25-Hydroxy, Vitamin D-3 45; CRP 1; Sed Rate 11; Vitamin B-12 393 Note:  Above Lab results reviewed.   Assessment  The primary encounter diagnosis was Chronic pain of left knee. A diagnosis of Primary osteoarthritis of left knee was also pertinent to this visit.  Plan of Care  I am having Waleska L. Woehr maintain her ALPRAZolam, metoprolol succinate, amLODipine-olmesartan, amitriptyline, atorvastatin, celecoxib, DULoxetine, fluticasone, Cholecalciferol, Fremanezumab-vfrm, hydrocortisone, pregabalin, acetaminophen, methocarbamol, tiZANidine, predniSONE, and rizatriptan.  1. Chronic pain of left knee -Patient obtained significant pain relief and improvement in her functional status as it pertains to her left knee pain related to osteoarthritis.  Given positive diagnostic genicular nerve block and ongoing pain relief rated as 80% for her left knee of instructed patient that we can perform genicular nerve radiofrequency ablation for her left knee as needed.  PRN order placed as below. - Radiofrequency,Genicular; Standing  2. Primary osteoarthritis of left knee -As above - Radiofrequency,Genicular; Standing  Orders:  Orders Placed This Encounter  Procedures  . Radiofrequency,Genicular    For knee pain.    Standing Status:   Standing    Number of Occurrences:   1    Standing Expiration Date:   12/07/2020    Scheduling Instructions:     Side(s): LEFT     Level(s): Superior-Lateral, Superior-Medial, and Inferior-Medial Genicular Nerve(s)     Sedation: With Sedation     TIMEFRAME: PRN procedure. (Ms. Giangrande will call when needed.)    Order Specific Question:   Where will this procedure be performed?    Answer:   [facility]   Follow-up plan:   Return if symptoms worsen or fail to improve.     s/p L GN RFA on 7/20-helpful and TPI 7/20; status post left hip injection in greater trochanteric bursa injection with orthopedics [facility]) not helpful.  History of L5-S1  laminectomies, status post caudal ESI as well as cervical TPI on 05/28/2019.  Status post left genicular nerve block on 03/21/2019 which was very effective for her left knee pain.  Improvement in her functional status.  PRN left knee genicular radiofrequency ablation.  Patient now having right knee pain and plan for right knee diagnostic genicular nerve block coming up.        Recent Visits Date Type Provider Dept  05/28/19 Procedure visit Gillis Santa, MD [facility]  05/14/19 Office Visit Gillis Santa, MD [facility]  04/30/19 Procedure visit Gillis Santa, MD [facility]  04/18/19 Office Visit Gillis Santa, MD [facility]  03/21/19 Procedure visit Gillis Santa, MD [facility]  Showing recent visits within past 90 days and meeting all other requirements   Today's Visits Date Type Provider Dept  06/07/19 Office Visit Gillis Santa, MD [facility]  Showing today's visits and meeting all other requirements   Future Appointments Date Type Provider Dept  06/11/19 Appointment Gillis Santa, MD [facility]  Showing future appointments within next 90 days and meeting all other requirements   I discussed the assessment and treatment plan with the patient. The patient was provided an opportunity to ask questions and all were answered. The patient agreed with the plan and demonstrated an understanding of the instructions.  Patient advised to call back or seek an in-person evaluation if the symptoms or condition worsens.  Total duration of non-face-to-face encounter: 41minutes.  Note by: Gillis Santa, MD Date: 06/07/2019; Time: 12:20 PM  Note: This dictation was prepared with Dragon dictation. Any transcriptional errors that may result from this process are unintentional.  Disclaimer:  * Given the special  circumstances of the COVID-19 pandemic, the federal government has announced that [employer] for [employer] Rights ([employer]) will  exercise its enforcement discretion and will not impose penalties on physicians using telehealth in the event of noncompliance with regulatory requirements under the [place] (HIPAA) in connection with the good faith provision of telehealth during the XX123456 national public health emergency. ([place])

## 2019-06-11 ENCOUNTER — Ambulatory Visit
Admission: RE | Admit: 2019-06-11 | Discharge: 2019-06-11 | Disposition: A | Discharge status: Home / Self Care | Payer: 59 | Source: Ambulatory Visit | Attending: Student in an Organized Health Care Education/Training Program | Admitting: Student in an Organized Health Care Education/Training Program

## 2019-06-11 ENCOUNTER — Encounter: Payer: Self-pay | Admitting: Student in an Organized Health Care Education/Training Program

## 2019-06-11 ENCOUNTER — Ambulatory Visit (HOSPITAL_BASED_OUTPATIENT_CLINIC_OR_DEPARTMENT_OTHER): Payer: 59 | Admitting: Student in an Organized Health Care Education/Training Program

## 2019-06-11 ENCOUNTER — Ambulatory Visit
Admission: RE | Admit: 2019-06-11 | Discharge: 2019-06-11 | Disposition: A | Discharge status: Home / Self Care | Payer: 59 | Source: Ambulatory Visit | Attending: Family Medicine | Admitting: Family Medicine

## 2019-06-11 ENCOUNTER — Other Ambulatory Visit: Payer: Self-pay

## 2019-06-11 VITALS — BP 125/94 | HR 71 | Temp 97.0°F | Resp 20 | Ht 65.0 in | Wt 167.0 lb

## 2019-06-11 DIAGNOSIS — M25551 Pain in right hip: Secondary | ICD-10-CM | POA: Diagnosis not present

## 2019-06-11 DIAGNOSIS — M7918 Myalgia, other site: Secondary | ICD-10-CM | POA: Diagnosis not present

## 2019-06-11 DIAGNOSIS — M1711 Unilateral primary osteoarthritis, right knee: Secondary | ICD-10-CM | POA: Insufficient documentation

## 2019-06-11 DIAGNOSIS — Z1231 Encounter for screening mammogram for malignant neoplasm of breast: Secondary | ICD-10-CM | POA: Diagnosis not present

## 2019-06-11 DIAGNOSIS — M79605 Pain in left leg: Secondary | ICD-10-CM | POA: Diagnosis not present

## 2019-06-11 DIAGNOSIS — G8929 Other chronic pain: Secondary | ICD-10-CM

## 2019-06-11 MED ORDER — DEXAMETHASONE SODIUM PHOSPHATE 10 MG/ML IJ SOLN
10.0000 mg | Freq: Once | INTRAMUSCULAR | Status: AC
  Administered 2019-06-11: 10 mg

## 2019-06-11 MED ORDER — FENTANYL CITRATE (PF) 100 MCG/2ML IJ SOLN
INTRAMUSCULAR | Status: AC
  Filled 2019-06-11: qty 2

## 2019-06-11 MED ORDER — ROPIVACAINE HCL 2 MG/ML IJ SOLN
1.0000 mL | Freq: Once | INTRAMUSCULAR | Status: AC
  Administered 2019-06-11: 1 mL via EPIDURAL

## 2019-06-11 MED ORDER — ROPIVACAINE HCL 2 MG/ML IJ SOLN
INTRAMUSCULAR | Status: AC
  Filled 2019-06-11: qty 10

## 2019-06-11 MED ORDER — DEXAMETHASONE SODIUM PHOSPHATE 10 MG/ML IJ SOLN
INTRAMUSCULAR | Status: AC
  Filled 2019-06-11: qty 1

## 2019-06-11 MED ORDER — FENTANYL CITRATE (PF) 100 MCG/2ML IJ SOLN
25.0000 ug | INTRAMUSCULAR | Status: DC | PRN
  Administered 2019-06-11: 50 ug via INTRAVENOUS

## 2019-06-11 MED ORDER — LIDOCAINE HCL 2 % IJ SOLN
20.0000 mL | Freq: Once | INTRAMUSCULAR | Status: AC
  Administered 2019-06-11: 400 mg

## 2019-06-11 MED ORDER — LIDOCAINE HCL 2 % IJ SOLN
INTRAMUSCULAR | Status: AC
  Filled 2019-06-11: qty 20

## 2019-06-11 NOTE — Progress Notes (Signed)
Patient's Name: Abigail [name]  MRN: [id number]  Referring Provider: Aletha Halim., PA-C  DOB: 08/10/1966  PCP: Aletha Halim., PA-C  DOS: 06/11/2019  Note by: Gillis Santa, MD  Service setting: Ambulatory outpatient  Specialty: Interventional Pain Management  Patient type: Established  Location: [facility] (AMB) Pain Management Facility  Visit type: Interventional Procedure   Primary Reason for Visit: Interventional Pain Management Treatment. CC: Knee Pain (right ), Leg Pain (left anterior tendon ), and Hip Pain (right )  Procedure:          Anesthesia, Analgesia, Anxiolysis:  Type: Genicular Nerves Block (Superior-lateral, Superior-medial, and Inferior-medial Genicular Nerves) #1  CPT: [id number]      Primary Purpose: Diagnostic Region: Lateral, Anterior, and Medial aspects of the knee joint, above and below the knee joint proper. Level: Superior and inferior to the knee joint. Target Area: For Genicular Nerve block(s), the targets are: the superior-lateral genicular nerve, located in the lateral distal portion of the femoral shaft as it curves to form the lateral epicondyle, in the region of the distal femoral metaphysis; the superior-medial genicular nerve, located in the medial distal portion of the femoral shaft as it curves to form the medial epicondyle; and the inferior-medial genicular nerve, located in the medial, proximal portion of the tibial shaft, as it curves to form the medial epicondyle, in the region of the proximal tibial metaphysis. Approach: Anterior, percutaneous, ipsilateral approach. Laterality: Right knee  Type: Local Anesthesia with moderate sedation  Local Anesthetic: Lidocaine 1-2%  Position: Modified Fowler's position with pillows under the targeted knee(s).   Indications: 1. Primary osteoarthritis of right knee   2. Chronic pain of right knee   3. Cervical myofascial pain syndrome    Pain Score: Pre-procedure: 8 /10 Post-procedure: 0-No pain/10  Pre-op  Assessment:  Abigail [name] is a 53 y.o. (year old), female patient, seen today for interventional treatment. She  has a past surgical history that includes left foot bone spur surgery (10 yrs ago); Abdominal hysterectomy (14 yrs ago); Lumbar laminectomy/decompression microdiscectomy (07/13/2012); Lumbar laminectomy/decompression microdiscectomy (Right, 03/07/2013); Hemi-microdiscectomy lumbar laminectomy level 1 (N/A, 09/28/2013); Breast biopsy (Left, 04/10/2015); Cholecystectomy; Eye surgery; Back surgery; and Anterior cervical decompression/discectomy fusion 4 level (N/A, 10/18/2018). Abigail [name] has a current medication list which includes the following prescription(s): acetaminophen, alprazolam, amitriptyline, amlodipine-olmesartan, atorvastatin, celecoxib, cholecalciferol, duloxetine, fluticasone, fremanezumab-vfrm, hydrocortisone, methocarbamol, metoprolol succinate, prednisone, pregabalin, rizatriptan, and tizanidine, and the following Facility-Administered Medications: fentanyl. Her primarily concern today is the Knee Pain (right ), Leg Pain (left anterior tendon ), and Hip Pain (right )  Initial Vital Signs:  Pulse/HCG Rate: 81ECG Heart Rate: 72 Temp: 98 F (36.7 C) Resp: 16 BP: 108/74 SpO2: 93 %  BMI: Estimated body mass index is 27.79 kg/m as calculated from the following:   Height as of this encounter: 5\' 5"  (1.651 m).   Weight as of this encounter: 167 lb (75.8 kg).  Risk Assessment: Allergies: Reviewed. She has No Known Allergies.  Allergy Precautions: None required Coagulopathies: Reviewed. None identified.  Blood-thinner therapy: None at this time Active Infection(s): Reviewed. None identified. Abigail [name] is afebrile  Site Confirmation: Abigail [name] was asked to confirm the procedure and laterality before marking the site Procedure checklist: Completed Consent: Before the procedure and under the influence of no sedative(s), amnesic(s), or anxiolytics, the patient was informed of  the treatment options, risks and possible complications. To fulfill our ethical and legal obligations, as recommended by the American Medical Association's Code of Ethics, I have informed  the patient of my clinical impression; the nature and purpose of the treatment or procedure; the risks, benefits, and possible complications of the intervention; the alternatives, including doing nothing; the risk(s) and benefit(s) of the alternative treatment(s) or procedure(s); and the risk(s) and benefit(s) of doing nothing. The patient was provided information about the general risks and possible complications associated with the procedure. These may include, but are not limited to: failure to achieve desired goals, infection, bleeding, organ or nerve damage, allergic reactions, paralysis, and death. In addition, the patient was informed of those risks and complications associated to the procedure, such as failure to decrease pain; infection; bleeding; organ or nerve damage with subsequent damage to sensory, motor, and/or autonomic systems, resulting in permanent pain, numbness, and/or weakness of one or several areas of the body; allergic reactions; (i.e.: anaphylactic reaction); and/or death. Furthermore, the patient was informed of those risks and complications associated with the medications. These include, but are not limited to: allergic reactions (i.e.: anaphylactic or anaphylactoid reaction(s)); adrenal axis suppression; blood sugar elevation that in diabetics may result in ketoacidosis or comma; water retention that in patients with history of congestive heart failure may result in shortness of breath, pulmonary edema, and decompensation with resultant heart failure; weight gain; swelling or edema; medication-induced neural toxicity; particulate matter embolism and blood vessel occlusion with resultant organ, and/or nervous system infarction; and/or aseptic necrosis of one or more joints. Finally, the patient was  informed that Medicine is not an exact science; therefore, there is also the possibility of unforeseen or unpredictable risks and/or possible complications that may result in a catastrophic outcome. The patient indicated having understood very clearly. We have given the patient no guarantees and we have made no promises. Enough time was given to the patient to ask questions, all of which were answered to the patient's satisfaction. Abigail [name] has indicated that she wanted to continue with the procedure. Attestation: I, the ordering provider, attest that I have discussed with the patient the benefits, risks, side-effects, alternatives, likelihood of achieving goals, and potential problems during recovery for the procedure that I have provided informed consent. Date  Time: 06/11/2019 12:02 PM  Pre-Procedure Preparation:  Monitoring: As per clinic protocol. Respiration, ETCO2, SpO2, BP, heart rate and rhythm monitor placed and checked for adequate function Safety Precautions: Patient was assessed for positional comfort and pressure points before starting the procedure. Time-out: I initiated and conducted the "Time-out" before starting the procedure, as per protocol. The patient was asked to participate by confirming the accuracy of the "Time Out" information. Verification of the correct person, site, and procedure were performed and confirmed by me, the nursing staff, and the patient. "Time-out" conducted as per Joint Commission's Universal Protocol (UP.01.01.01). Time: 1325  Description of Procedure:          Area Prepped: Entire knee area, from mid-thigh to mid-shin, lateral, anterior, and medial aspects. Prepping solution: DuraPrep (Iodine Povacrylex [0.7% available iodine] and Isopropyl Alcohol, 74% w/w) Safety Precautions: Aspiration looking for blood return was conducted prior to all injections. At no point did we inject any substances, as a needle was being advanced. No attempts were made at  seeking any paresthesias. Safe injection practices and needle disposal techniques used. Medications properly checked for expiration dates. SDV (single dose vial) medications used. Description of the Procedure: Protocol guidelines were followed. The patient was placed in position over the procedure table. The target area was identified and the area prepped in the usual manner. Skin & deeper tissues  infiltrated with local anesthetic. Appropriate amount of time allowed to pass for local anesthetics to take effect. The procedure needles were then advanced to the target area. Proper needle placement secured. Negative aspiration confirmed. Solution injected in intermittent fashion, asking for systemic symptoms every 0.5cc of injectate. The needles were then removed and the area cleansed, making sure to leave some of the prepping solution back to take advantage of its long term bactericidal properties.  Vitals:   06/11/19 1330 06/11/19 1335 06/11/19 1342 06/11/19 1352  BP: 113/70 114/68 115/74 (!) 125/94  Pulse: 75 71    Resp: 13 11 14 20   Temp:   (!) 97 F (36.1 C)   TempSrc:      SpO2: 95% 96% 98% 99%  Weight:      Height:        Start Time: 1325 hrs. End Time: 1135 hrs. Materials:  Needle(s) Type: Spinal Needle Gauge: 22G Length: 3.5-in Medication(s): Please see orders for medications and dosing details. 5 cc solution made of 4 cc of 0.2% ropivacaine, 1 cc of Decadron 10 mg/cc.  1.5 cc injected at each level on the right  Imaging Guidance (Non-Spinal):          Type of Imaging Technique: Fluoroscopy Guidance (Non-Spinal) Indication(s): Assistance in needle guidance and placement for procedures requiring needle placement in or near specific anatomical locations not easily accessible without such assistance. Exposure Time: Please see nurses notes. Contrast: None used. Fluoroscopic Guidance: I was personally present during the use of fluoroscopy. "Tunnel Vision Technique" used to obtain the  best possible view of the target area. Parallax error corrected before commencing the procedure. "Direction-depth-direction" technique used to introduce the needle under continuous pulsed fluoroscopy. Once target was reached, antero-posterior, oblique, and lateral fluoroscopic projection used confirm needle placement in all planes. Images permanently stored in EMR. Interpretation: No contrast injected. I personally interpreted the imaging intraoperatively. Adequate needle placement confirmed in multiple planes. Permanent images saved into the patient's record.  Procedure #2:  Anesthesia, Analgesia, Anxiolysis:  Type: Trigger Point Injection  #1  Level: Left knee  Position: Prone Primary Purpose: Diagnostic indication: Myofascial pain  Type: Local Anesthesia Indication(s): Analgesia         Local Anesthetic: Lidocaine 1-2% Route: Infiltration (Geraldine/IM) IV Access: Declined Sedation: Declined   Approximately 3 trigger point injected and left knee with 0.5 to 1 cc of 0.2% ropivacaine.  Plan of Care  Orders:  Orders Placed This Encounter  Procedures  . TRIGGER POINT INJECTION    Standing Status:   Future    Standing Expiration Date:   07/11/2019    Scheduling Instructions:     Cervical/Thoracic TPI in 2 weeks without sedation    Order Specific Question:   Where will this procedure be performed?    Answer:   [facility]  . DG PAIN [facility] C-ARM 1-60 MIN NO REPORT    Intraoperative interpretation by procedural physician at [place].    Standing Status:   Standing    Number of Occurrences:   1    Order Specific Question:   Reason for exam:    Answer:   Assistance in needle guidance and placement for procedures requiring needle placement in or near specific anatomical locations not easily accessible without such assistance.   Medications ordered for procedure: Meds ordered this encounter  Medications  . lidocaine (XYLOCAINE) 2 % (with pres) injection 400 mg  . fentaNYL  (SUBLIMAZE) injection 25-50 mcg    Make sure Narcan  is available in the pyxis when using this medication. In the event of respiratory depression (RR< 8/min): Titrate NARCAN (naloxone) in increments of 0.1 to 0.2 mg IV at 2-3 minute intervals, until desired degree of reversal.  . ropivacaine (PF) 2 mg/mL (0.2%) (NAROPIN) injection 1 mL  . dexamethasone (DECADRON) injection 10 mg   Medications administered: We administered lidocaine, fentaNYL, ropivacaine (PF) 2 mg/mL (0.2%), and dexamethasone.  See the medical record for exact dosing, route, and time of administration.  Disposition: Discharge home  Discharge Date & Time: 06/11/2019;   hrs.   Follow-up plan:   Return in about 5 weeks (around 07/16/2019) for Post Procedure Evaluation, virtual.     Future Appointments  Date Time Provider [place]  07/02/2019 11:30 AM Gillis Santa, MD [facility] None  07/16/2019  2:00 PM Gillis Santa, MD [facility] None   Primary Care Physician: Aletha Halim., PA-C Location: [facility] Out[facility] Facility Note by: Gillis Santa, MD Date: 06/11/2019; Time: 3:29 PM  Disclaimer:  Medicine is not an exact science. The only guarantee in medicine is that nothing is guaranteed. It is important to note that the decision to proceed with this intervention was based on the information collected from the patient. The Data and conclusions were drawn from the patient's questionnaire, the interview, and the physical examination. Because the information was provided in large part by the patient, it cannot be guaranteed that it has not been purposely or unconsciously manipulated. Every effort has been made to obtain as much relevant data as possible for this evaluation. It is important to note that the conclusions that lead to this procedure are derived in large part from the available data. Always take into account that the treatment will also be dependent on availability of resources and existing treatment  guidelines, considered by other Pain Management Practitioners as being common knowledge and practice, at the time of the intervention. For Medico-Legal purposes, it is also important to point out that variation in procedural techniques and pharmacological choices are the acceptable norm. The indications, contraindications, technique, and results of the above procedure should only be interpreted and judged by a Board-Certified Interventional Pain Specialist with extensive familiarity and expertise in the same exact procedure and technique.

## 2019-06-11 NOTE — Patient Instructions (Signed)
Post-procedure Information What to expect: Most procedures involve the use of a local anesthetic (numbing medicine), and a steroid (anti-inflammatory medicine).  The local anesthetics may cause temporary numbness and weakness of the legs or arms, depending on the location of the block. This numbness/weakness may last 4-6 hours, depending on the local anesthetic used. In rare instances, it can last up to 24 hours. While numb, you must be very careful not to injure the extremity.  After any procedure, you could expect the pain to get better within 15-20 minutes. This relief is temporary and may last 4-6 hours. Once the local anesthetics wears off, you could experience discomfort, possibly more than usual, for up to 10 (ten) days. In the case of radiofrequencies, it may last up to 6 weeks. Surgeries may take up to 8 weeks for the healing process. The discomfort is due to the irritation caused by needles going through skin and muscle. To minimize the discomfort, we recommend using ice the first day, and heat from then on. The ice should be applied for 15 minutes on, and 15 minutes off. Keep repeating this cycle until bedtime. Avoid applying the ice directly to the skin, to prevent frostbite. Heat should be used daily, until the pain improves (4-10 days). Be careful not to burn yourself.  Occasionally you may experience muscle spasms or cramps. These occur as a consequence of the irritation caused by the needle sticks to the muscle and the blood that will inevitably be lost into the surrounding muscle tissue. Blood tends to be very irritating to tissues, which tend to react by going into spasm. These spasms may start the same day of your procedure, but they may also take days to develop. This late onset type of spasm or cramp is usually caused by electrolyte imbalances triggered by the steroids, at the level of the kidney. Cramps and spasms tend to respond well to muscle relaxants, multivitamins (some are  triggered by the procedure, but may have their origins in vitamin deficiencies), and "Gatorade", or any sports drinks that can replenish any electrolyte imbalances. (If you are a diabetic, ask your pharmacist to get you a sugar-free brand.) Warm showers or baths may also be helpful. Stretching exercises are highly recommended. General Instructions:  Be alert for signs of possible infection: redness, swelling, heat, red streaks, elevated temperature, and/or fever. These typically appear 4 to 6 days after the procedure. Immediately notify your doctor if you experience unusual bleeding, difficulty breathing, or loss of bowel or bladder control. If you experience increased pain, do not increase your pain medicine intake, unless instructed by your pain physician. Post-Procedure Care:  Be careful in moving about. Muscle spasms in the area of the injection may occur. Applying ice or heat to the area is often helpful. The incidence of spinal headaches after epidural injections ranges between 1.4% and 6%. If you develop a headache that does not seem to respond to conservative therapy, please let your physician know. This can be treated with an epidural blood patch.   Post-procedure numbness or redness is to be expected, however it should average 4 to 6 hours. If numbness and weakness of your extremities begins to develop 4 to 6 hours after your procedure, and is felt to be progressing and worsening, immediately contact your physician.   Diet:  If you experience nausea, do not eat until this sensation goes away. If you had a "Stellate Ganglion Block" for upper extremity "Reflex Sympathetic Dystrophy", do not eat or drink until your   hoarseness goes away. In any case, always start with liquids first and if you tolerate them well, then slowly progress to more solid foods. Activity:  For the first 4 to 6 hours after the procedure, use caution in moving about as you may experience numbness and/or weakness. Use caution in  cooking, using household electrical appliances, and climbing steps. If you need to reach your Doctor call our office: [phone] Monday-Thursday 8:00 am - 4:00 PM    Fridays: Closed     In case of an emergency: In case of emergency, call 911 or go to the nearest emergency room and have the physician there call us.  Interpretation of Procedure Every nerve block has two components: a diagnostic component, and a treatment component. Unrealistic expectations are the most common causes of "perceived failure".  In a perfect world, a single nerve block should be able to completely and permanently eliminate the pain. Sadly, the world is not perfect.  Most pain management nerve blocks are performed using local anesthetics and steroids. Steroids are responsible for any long-term benefit that you may experience. Their purpose is to decrease any chronic swelling that may exist in the area. Steroids begin to work immediately after being injected. However, most patients will not experience any benefits until 5 to 10 days after the injection, when the swelling has come down to the point where they can tell a difference. Steroids will only help if there is swelling to be treated. As such, they can assist with the diagnosis. If effective, they suggest an inflammatory component to the pain, and if ineffective, they rule out inflammation as the main cause or component of the problem. If the problem is one of mechanical compression, you will get no benefit from those steroids.   In the case of local anesthetics, they have a crucial role in the diagnosis of your condition. Most will begin to work within15 to 20 minutes after injection. The duration will depend on the type used (short- vs. Long-acting). It is of outmost importance that patients keep tract of their pain, after the procedure. To assist with this matter, a "Post-procedure Pain Diary" is provided. Make sure to complete it and to bring it back to your  follow-up appointment.  As long as the patient keeps accurate, detailed records of their symptoms after every procedure, and returns to have those interpreted, every procedure will provide us with invaluable information. Even a block that does not provide the patient with any relief, will always provide us with information about the mechanism and the origin of the pain. The only time a nerve block can be considered a waste of time is when patients do not keep track of the results, or do not keep their post-procedure appointment.  Reporting the results back to your physician The Pain Score  Pain is a subjective complaint. It cannot be seen, touched, or measured. We depend entirely on the patient's report of the pain in order to assess your condition and treatment. To evaluate the pain, we use a pain scale, where "0" means "No Pain", and a "10" is "the worst possible pain that you can even imagine" (i.e. something like been eaten alive by a shark or being torn apart by a lion).   You will frequently be asked to rate your pain. Please be as accurate, remember that medical decisions will be based on your responses. Please do not rate your pain above a 10. Doing so is actually interpreted as "symptom magnification" (exaggeration), as   well as lack of understanding with regards to the scale. To put this into perspective, when you tell us that your pain is at a 10 (ten), what you are saying is that there is nothing we can do to make this pain any worse. (Carefully think about that.) Preparing for your procedure (without sedation) Instructions: . Oral Intake: Do not eat or drink anything for at least 3 hours prior to your procedure. . Transportation: Unless otherwise stated by your physician, you may drive yourself after the procedure. . Blood Pressure Medicine: Take your blood pressure medicine with a sip of water the morning of the procedure. . Insulin: Take only  of your normal insulin dose. . Preventing  infections: Shower with an antibacterial soap the morning of your procedure. . Build-up your immune system: Take 1000 mg of Vitamin C with every meal (3 times a day) the day prior to your procedure. . Pregnancy: If you are pregnant, call and cancel the procedure. . Sickness: If you have a cold, fever, or any active infections, call and cancel the procedure. . Arrival: You must be in the facility at least 30 minutes prior to your scheduled procedure. . Children: Do not bring any children with you. . Dress appropriately: Bring dark clothing that you would not mind if they get stained. . Valuables: Do not bring any jewelry or valuables. Procedure appointments are reserved for interventional treatments only. Marland Kitchen No Prescription Refills. . No medication changes will be discussed during procedure appointments. . No disability issues will be discussed. Marland Kitchen

## 2019-06-11 NOTE — Progress Notes (Signed)
Safety precautions to be maintained throughout the outpatient stay will include: orient to surroundings, keep bed in low position, maintain call bell within reach at all times, provide assistance with transfer out of bed and ambulation.  

## 2019-06-12 ENCOUNTER — Telehealth: Payer: Self-pay

## 2019-06-12 NOTE — Telephone Encounter (Signed)
Pt was called and no problems reported. 

## 2019-07-02 ENCOUNTER — Other Ambulatory Visit: Payer: Self-pay

## 2019-07-02 ENCOUNTER — Encounter: Payer: Self-pay | Admitting: Student in an Organized Health Care Education/Training Program

## 2019-07-02 ENCOUNTER — Ambulatory Visit
Payer: Medicare Other | Attending: Student in an Organized Health Care Education/Training Program | Admitting: Student in an Organized Health Care Education/Training Program

## 2019-07-02 VITALS — BP 118/91 | HR 87 | Temp 98.9°F | Resp 16 | Ht 65.0 in | Wt 167.0 lb

## 2019-07-02 DIAGNOSIS — Z01818 Encounter for other preprocedural examination: Secondary | ICD-10-CM | POA: Insufficient documentation

## 2019-07-02 DIAGNOSIS — Z7901 Long term (current) use of anticoagulants: Secondary | ICD-10-CM | POA: Diagnosis not present

## 2019-07-02 DIAGNOSIS — Z79899 Other long term (current) drug therapy: Secondary | ICD-10-CM | POA: Diagnosis not present

## 2019-07-02 DIAGNOSIS — M5090 Cervical disc disorder, unspecified, unspecified cervical region: Secondary | ICD-10-CM | POA: Insufficient documentation

## 2019-07-02 DIAGNOSIS — M7918 Myalgia, other site: Secondary | ICD-10-CM | POA: Diagnosis not present

## 2019-07-02 DIAGNOSIS — M25519 Pain in unspecified shoulder: Secondary | ICD-10-CM | POA: Insufficient documentation

## 2019-07-02 MED ORDER — ROPIVACAINE HCL 2 MG/ML IJ SOLN: 1.0000 mL | Freq: Once | INTRAMUSCULAR | Status: DC

## 2019-07-02 MED ORDER — DEXAMETHASONE SODIUM PHOSPHATE 10 MG/ML IJ SOLN
INTRAMUSCULAR | Status: AC
  Filled 2019-07-02: qty 1

## 2019-07-02 MED ORDER — ROPIVACAINE HCL 2 MG/ML IJ SOLN
INTRAMUSCULAR | Status: AC
  Filled 2019-07-02: qty 10

## 2019-07-02 MED ORDER — ROPIVACAINE HCL 2 MG/ML IJ SOLN: 2.0000 mL | Freq: Once | INTRAMUSCULAR | Status: DC

## 2019-07-02 NOTE — Progress Notes (Signed)
Safety precautions to be maintained throughout the outpatient stay will include: orient to surroundings, keep bed in low position, maintain call bell within reach at all times, provide assistance with transfer out of bed and ambulation.  

## 2019-07-02 NOTE — Progress Notes (Signed)
Patient's Name: Abigail [name]  MRN: [id number]  Referring Provider: Aletha Halim., PA-C  DOB: November 18, 1965  PCP: Aletha Halim., PA-C  DOS: 07/02/2019  Note by: Gillis Santa, MD  Service setting: Ambulatory outpatient  Specialty: Interventional Pain Management  Patient type: Established  Location: [facility] (AMB) Pain Management Facility  Visit type: Interventional Procedure   Primary Reason for Visit: Interventional Pain Management Treatment. CC: neck and shoulder blade pain  Procedure:          Anesthesia, Analgesia, Anxiolysis:  Type: Trigger Point Injection (3+) #2  CPT: 20553 Primary Purpose: Therapeutic Region: Posterior Cervicothoracic Level: Cervico-thoracic Target Area: Cervical, thoracic, periscapular trigger Point Approach: Percutaneous, ipsilateral approach. Laterality: Bilateral        Type: Local Anesthesia    Indications: Myofascial pain syndrome  Pain Score: Pre-procedure: 6 /10 Post-procedure: 6 /10   Pre-op Assessment:  Abigail [name] is a 53 y.o. (year old), female patient, seen today for interventional treatment. She  has a past surgical history that includes left foot bone spur surgery (10 yrs ago); Abdominal hysterectomy (14 yrs ago); Lumbar laminectomy/decompression microdiscectomy (07/13/2012); Lumbar laminectomy/decompression microdiscectomy (Right, 03/07/2013); Hemi-microdiscectomy lumbar laminectomy level 1 (N/A, 09/28/2013); Breast biopsy (Left, 04/10/2015); Cholecystectomy; Eye surgery; Back surgery; and Anterior cervical decompression/discectomy fusion 4 level (N/A, 10/18/2018). Abigail [name] has a current medication list which includes the following prescription(s): acetaminophen, alprazolam, amitriptyline, amlodipine-olmesartan, atorvastatin, celecoxib, cholecalciferol, duloxetine, fluticasone, fremanezumab-vfrm, hydrocortisone, methocarbamol, metoprolol succinate, prednisone, pregabalin, rizatriptan, and tizanidine, and the following Facility-Administered  Medications: ropivacaine (pf) 2 mg/ml (0.2%) and ropivacaine (pf) 2 mg/ml (0.2%). Her primarily concern today is the No chief complaint on file.  Initial Vital Signs:  Pulse/HCG Rate: 87  Temp: 98.9 F (37.2 C) Resp: 16 BP: (!) 118/91 SpO2: 96 %  BMI: Estimated body mass index is 27.79 kg/m as calculated from the following:   Height as of this encounter: 5\' 5"  (1.651 m).   Weight as of this encounter: 167 lb (75.8 kg).  Risk Assessment: Allergies: Reviewed. She has No Known Allergies.  Allergy Precautions: None required Coagulopathies: Reviewed. None identified.  Blood-thinner therapy: None at this time Active Infection(s): Reviewed. None identified. Abigail [name] is afebrile  Site Confirmation: Ms. Strength was asked to confirm the procedure and laterality before marking the site Procedure checklist: Completed Consent: Before the procedure and under the influence of no sedative(s), amnesic(s), or anxiolytics, the patient was informed of the treatment options, risks and possible complications. To fulfill our ethical and legal obligations, as recommended by the American Medical Association's Code of Ethics, I have informed the patient of my clinical impression; the nature and purpose of the treatment or procedure; the risks, benefits, and possible complications of the intervention; the alternatives, including doing nothing; the risk(s) and benefit(s) of the alternative treatment(s) or procedure(s); and the risk(s) and benefit(s) of doing nothing. The patient was provided information about the general risks and possible complications associated with the procedure. These may include, but are not limited to: failure to achieve desired goals, infection, bleeding, organ or nerve damage, allergic reactions, paralysis, and death. In addition, the patient was informed of those risks and complications associated to the procedure, such as failure to decrease pain; infection; bleeding; organ or nerve  damage with subsequent damage to sensory, motor, and/or autonomic systems, resulting in permanent pain, numbness, and/or weakness of one or several areas of the body; allergic reactions; (i.e.: anaphylactic reaction); and/or death. Furthermore, the patient was informed of those risks and complications associated with the medications.  These include, but are not limited to: allergic reactions (i.e.: anaphylactic or anaphylactoid reaction(s)); adrenal axis suppression; blood sugar elevation that in diabetics may result in ketoacidosis or comma; water retention that in patients with history of congestive heart failure may result in shortness of breath, pulmonary edema, and decompensation with resultant heart failure; weight gain; swelling or edema; medication-induced neural toxicity; particulate matter embolism and blood vessel occlusion with resultant organ, and/or nervous system infarction; and/or aseptic necrosis of one or more joints. Finally, the patient was informed that Medicine is not an exact science; therefore, there is also the possibility of unforeseen or unpredictable risks and/or possible complications that may result in a catastrophic outcome. The patient indicated having understood very clearly. We have given the patient no guarantees and we have made no promises. Enough time was given to the patient to ask questions, all of which were answered to the patient's satisfaction. Abigail [name] has indicated that she wanted to continue with the procedure. Attestation: I, the ordering provider, attest that I have discussed with the patient the benefits, risks, side-effects, alternatives, likelihood of achieving goals, and potential problems during recovery for the procedure that I have provided informed consent. Date  Time: 07/02/2019 11:22 AM  Pre-Procedure Preparation:  Monitoring: As per clinic protocol. Respiration, ETCO2, SpO2, BP, heart rate and rhythm monitor placed and checked for adequate  function Safety Precautions: Patient was assessed for positional comfort and pressure points before starting the procedure. Time-out: I initiated and conducted the "Time-out" before starting the procedure, as per protocol. The patient was asked to participate by confirming the accuracy of the "Time Out" information. Verification of the correct person, site, and procedure were performed and confirmed by me, the nursing staff, and the patient. "Time-out" conducted as per Joint Commission's Universal Protocol ([email or web address].01.01.01). Time: 1155  Description of Procedure:          Area Prepped: Entire Posterior Cervicothoracic Region Prepping solution: DuraPrep (Iodine Povacrylex [0.7% available iodine] and Isopropyl Alcohol, 74% w/w) Safety Precautions: Aspiration looking for blood return was conducted prior to all injections. At no point did we inject any substances, as a needle was being advanced. No attempts were made at seeking any paresthesias. Safe injection practices and needle disposal techniques used. Medications properly checked for expiration dates. SDV (single dose vial) medications used. Description of the Procedure: Protocol guidelines were followed. The patient was placed in position over the fluoroscopy table. The target area was identified and the area prepped in the usual manner. Skin & deeper tissues infiltrated with local anesthetic. Appropriate amount of time allowed to pass for local anesthetics to take effect. The procedure needles were then advanced to the target area. Proper needle placement secured. Negative aspiration confirmed. Solution injected in intermittent fashion, asking for systemic symptoms every 0.5cc of injectate. The needles were then removed and the area cleansed, making sure to leave some of the prepping solution back to take advantage of its long term bactericidal properties.  Vitals:   07/02/19 1131  BP: (!) 118/91  Pulse: 87  Resp: 16  Temp: 98.9 F (37.2 C)  TempSrc:  Oral  SpO2: 96%  Weight: 167 lb (75.8 kg)  Height: 5\' 5"  (1.651 m)    Start Time: 1155 hrs. End Time: 1200 hrs. Materials:  Needle(s) Type: Regular needle Gauge: 25G Length: 1.5-in Medication(s): Please see orders for medications and dosing details. Approximately 19 trigger points injected with 0.5 to 1 cc of 0.2% ropivacaine with dry needling performed at each trigger point. Imaging  Guidance:          Type of Imaging Technique: None used Indication(s): N/A Exposure Time: No patient exposure Contrast: None used. Fluoroscopic Guidance: N/A Ultrasound Guidance: N/A Interpretation: N/A  Antibiotic Prophylaxis:   Anti-infectives (From admission, onward)   None     Indication(s): None identified  Post-operative Assessment:  Post-procedure Vital Signs:  Pulse/HCG Rate: 87  Temp: 98.9 F (37.2 C) Resp: 16 BP: (!) 118/91 SpO2: 96 %  EBL: None  Complications: No immediate post-treatment complications observed by team, or reported by patient.  Note: The patient tolerated the entire procedure well. A repeat set of vitals were taken after the procedure and the patient was kept under observation following institutional policy, for this type of procedure. Post-procedural neurological assessment was performed, showing return to baseline, prior to discharge. The patient was provided with post-procedure discharge instructions, including a section on how to identify potential problems. Should any problems arise concerning this procedure, the patient was given instructions to immediately contact us, at any time, without hesitation. In any case, we plan to contact the patient by telephone for a follow-up status report regarding this interventional procedure.  Comments:  No additional relevant information.  Plan of Care  Medications ordered for procedure: Meds ordered this encounter  Medications  . ropivacaine (PF) 2 mg/mL (0.2%) (NAROPIN) injection 1 mL  . ropivacaine (PF) 2 mg/mL  (0.2%) (NAROPIN) injection 2 mL    Follow-up plan:   Return for Keep sch. appt.     Recent Visits Date Type Provider Dept  06/11/19 Procedure visit Gillis Santa, MD [facility]  06/07/19 Office Visit Gillis Santa, MD [facility]  05/28/19 Procedure visit Gillis Santa, MD [facility]  05/14/19 Office Visit Gillis Santa, MD [facility]  04/30/19 Procedure visit Gillis Santa, MD [facility]  04/18/19 Office Visit Gillis Santa, MD [facility]  Showing recent visits within past 90 days and meeting all other requirements   Today's Visits Date Type Provider Dept  07/02/19 Procedure visit Gillis Santa, MD [facility]  Showing today's visits and meeting all other requirements   Future Appointments Date Type Provider Dept  07/18/19 Appointment Gillis Santa, MD [facility]  08/13/19 Appointment Gillis Santa, MD [facility]  Showing future appointments within next 90 days and meeting all other requirements   Disposition: Discharge home  Discharge Date & Time: 07/02/2019; 1201 hrs.   Primary Care Physician: Aletha Halim PA-C Location: [facility] Note by: Gillis Santa, MD Date: 07/02/2019; Time: 2:21 PM  Disclaimer:  Medicine is not an exact science. The only guarantee in medicine is that nothing is guaranteed. It is important to note that the decision to proceed with this intervention was based on the information collected from the patient. The Data and conclusions were drawn from the patient's questionnaire, the interview, and the physical examination. Because the information was provided in large part by the patient, it cannot be guaranteed that it has not been purposely or unconsciously manipulated. Every effort has been made to obtain as much relevant data as possible for this evaluation. It is important to note that the conclusions that lead to this  procedure are derived in large part from the available data. Always take into account that the treatment will also be dependent on availability of resources and existing treatment guidelines, considered by other Pain Management Practitioners as being common knowledge and practice, at the time of the intervention. For  Medico-Legal purposes, it is also important to point out that variation in procedural techniques and pharmacological choices are the acceptable norm. The indications, contraindications, technique, and results of the above procedure should only be interpreted and judged by a Board-Certified Interventional Pain Specialist with extensive familiarity and expertise in the same exact procedure and technique.

## 2019-07-03 ENCOUNTER — Telehealth: Payer: Self-pay

## 2019-07-03 NOTE — Telephone Encounter (Signed)
Post procedure phone call.  Patient states she is doing great 

## 2019-07-11 ENCOUNTER — Encounter: Payer: Self-pay | Admitting: Student in an Organized Health Care Education/Training Program

## 2019-07-16 ENCOUNTER — Ambulatory Visit: Payer: 59 | Admitting: Student in an Organized Health Care Education/Training Program

## 2019-07-18 ENCOUNTER — Ambulatory Visit: Payer: Medicare Other | Admitting: Student in an Organized Health Care Education/Training Program

## 2019-07-18 ENCOUNTER — Encounter: Payer: Self-pay | Admitting: Student in an Organized Health Care Education/Training Program

## 2019-07-18 ENCOUNTER — Ambulatory Visit
Admission: RE | Admit: 2019-07-18 | Discharge: 2019-07-18 | Disposition: A | Discharge status: Home / Self Care | Payer: Medicare Other | Source: Ambulatory Visit | Attending: Student in an Organized Health Care Education/Training Program | Admitting: Student in an Organized Health Care Education/Training Program

## 2019-07-18 ENCOUNTER — Other Ambulatory Visit: Payer: Self-pay

## 2019-07-18 VITALS — BP 123/70 | HR 85 | Temp 97.3°F | Resp 20 | Ht 65.0 in | Wt 167.0 lb

## 2019-07-18 DIAGNOSIS — G8929 Other chronic pain: Secondary | ICD-10-CM | POA: Diagnosis present

## 2019-07-18 DIAGNOSIS — M1712 Unilateral primary osteoarthritis, left knee: Secondary | ICD-10-CM | POA: Insufficient documentation

## 2019-07-18 DIAGNOSIS — M25562 Pain in left knee: Secondary | ICD-10-CM | POA: Insufficient documentation

## 2019-07-18 DIAGNOSIS — M961 Postlaminectomy syndrome, not elsewhere classified: Secondary | ICD-10-CM | POA: Diagnosis present

## 2019-07-18 MED ORDER — ROPIVACAINE HCL 2 MG/ML IJ SOLN
1.0000 mL | Freq: Once | INTRAMUSCULAR | Status: AC
  Administered 2019-07-18: 1 mL via EPIDURAL

## 2019-07-18 MED ORDER — FENTANYL CITRATE (PF) 100 MCG/2ML IJ SOLN
INTRAMUSCULAR | Status: AC
  Filled 2019-07-18: qty 2

## 2019-07-18 MED ORDER — DEXAMETHASONE SODIUM PHOSPHATE 10 MG/ML IJ SOLN
INTRAMUSCULAR | Status: AC
  Filled 2019-07-18: qty 2

## 2019-07-18 MED ORDER — LIDOCAINE HCL 2 % IJ SOLN
INTRAMUSCULAR | Status: AC
  Filled 2019-07-18: qty 20

## 2019-07-18 MED ORDER — DEXAMETHASONE SODIUM PHOSPHATE 10 MG/ML IJ SOLN
10.0000 mg | Freq: Once | INTRAMUSCULAR | Status: AC
  Administered 2019-07-18: 10 mg

## 2019-07-18 MED ORDER — LIDOCAINE HCL 2 % IJ SOLN
20.0000 mL | Freq: Once | INTRAMUSCULAR | Status: AC
  Administered 2019-07-18: 400 mg

## 2019-07-18 MED ORDER — FENTANYL CITRATE (PF) 100 MCG/2ML IJ SOLN
25.0000 ug | INTRAMUSCULAR | Status: DC | PRN
  Administered 2019-07-18: 50 ug via INTRAVENOUS

## 2019-07-18 MED ORDER — ROPIVACAINE HCL 2 MG/ML IJ SOLN
INTRAMUSCULAR | Status: AC
  Filled 2019-07-18: qty 10

## 2019-07-18 NOTE — Progress Notes (Signed)
Safety precautions to be maintained throughout the outpatient stay will include: orient to surroundings, keep bed in low position, maintain call bell within reach at all times, provide assistance with transfer out of bed and ambulation.  

## 2019-07-18 NOTE — Progress Notes (Signed)
Patient's Name: Abigail [name]  MRN: [id number]  Referring Provider: Gillis Santa, MD  DOB: 23-May-1966  PCP: Aletha Halim., PA-C  DOS: 07/18/2019  Note by: Gillis Santa, MD  Service setting: Ambulatory outpatient  Specialty: Interventional Pain Management  Patient type: Established  Location: [facility] (AMB) Pain Management Facility  Visit type: Interventional Procedure   Primary Reason for Visit: Interventional Pain Management Treatment. CC: Knee Pain (left)  Procedure:          Anesthesia, Analgesia, Anxiolysis:  Type: Therapeutic Superior-lateral, Superior-medial, and Inferior-medial, Genicular Nerve Radiofrequency Ablation.  #2  Region: Lateral, Anterior, and Medial aspects of the knee joint, above and below the knee joint proper. Level: Superior and inferior to the knee joint. Laterality: Left  Type: Moderate (Conscious) Sedation combined with Local Anesthesia Indication(s): Analgesia and Anxiety Route: Intravenous (IV) IV Access: Secured Sedation: Meaningful verbal contact was maintained at all times during the procedure  Local Anesthetic: Lidocaine 1-2%  Position: Supine   Indications: 1. Chronic pain of left knee   2. Primary osteoarthritis of left knee   3. Postlaminectomy syndrome, lumbar (Bilateral L5/S1 [place])    Abigail [name] has been dealing with the above chronic pain for longer than three months and has either failed to respond, was unable to tolerate, or simply did not get enough benefit from other more conservative therapies including, but not limited to: 1. Over-the-counter medications 2. Anti-inflammatory medications 3. Muscle relaxants 4. Membrane stabilizers 5. Opioids 6. Physical therapy and/or chiropractic manipulation 7. Modalities (Heat, ice, etc.) 8. Invasive techniques such as nerve blocks. Abigail [name] has attained more than 50% relief of the pain from a series of diagnostic injections conducted in separate occasions.  Pain Score: Pre-procedure: 7  /10 Post-procedure: 0-No pain/10  Pre-op Assessment:  Abigail [name] is a 53 y.o. (year old), female patient, seen today for interventional treatment. She  has a past surgical history that includes left foot bone spur surgery (10 yrs ago); Abdominal hysterectomy (14 yrs ago); Lumbar laminectomy/decompression microdiscectomy (07/13/2012); Lumbar laminectomy/decompression microdiscectomy (Right, 03/07/2013); Hemi-microdiscectomy lumbar laminectomy level 1 (N/A, 09/28/2013); Breast biopsy (Left, 04/10/2015); Cholecystectomy; Eye surgery; Back surgery; and Anterior cervical decompression/discectomy fusion 4 level (N/A, 10/18/2018). Abigail [name] has a current medication list which includes the following prescription(s): acetaminophen, alprazolam, amitriptyline, amlodipine-olmesartan, atorvastatin, celecoxib, cholecalciferol, clonazepam, duloxetine, fluticasone, fremanezumab-vfrm, hydrocortisone, methocarbamol, metoprolol succinate, nortriptyline, prednisone, pregabalin, rizatriptan, and tizanidine, and the following Facility-Administered Medications: fentanyl. Her primarily concern today is the Knee Pain (left)  Initial Vital Signs:  Pulse/HCG Rate: 85ECG Heart Rate: 77 Temp: 97.9 F (36.6 C) Resp: 16 BP: (!) 132/97 SpO2: 98 %  BMI: Estimated body mass index is 27.79 kg/m as calculated from the following:   Height as of this encounter: 5\' 5"  (1.651 m).   Weight as of this encounter: 167 lb (75.8 kg).  Risk Assessment: Allergies: Reviewed. She has No Known Allergies.  Allergy Precautions: None required Coagulopathies: Reviewed. None identified.  Blood-thinner therapy: None at this time Active Infection(s): Reviewed. None identified. Abigail [name] is afebrile  Site Confirmation: Abigail [name] was asked to confirm the procedure and laterality before marking the site Procedure checklist: Completed Consent: Before the procedure and under the influence of no sedative(s), amnesic(s), or anxiolytics, the  patient was informed of the treatment options, risks and possible complications. To fulfill our ethical and legal obligations, as recommended by the American Medical Association's Code of Ethics, I have informed the patient of my clinical impression; the nature and purpose of the treatment or procedure;  the risks, benefits, and possible complications of the intervention; the alternatives, including doing nothing; the risk(s) and benefit(s) of the alternative treatment(s) or procedure(s); and the risk(s) and benefit(s) of doing nothing. The patient was provided information about the general risks and possible complications associated with the procedure. These may include, but are not limited to: failure to achieve desired goals, infection, bleeding, organ or nerve damage, allergic reactions, paralysis, and death. In addition, the patient was informed of those risks and complications associated to the procedure, such as failure to decrease pain; infection; bleeding; organ or nerve damage with subsequent damage to sensory, motor, and/or autonomic systems, resulting in permanent pain, numbness, and/or weakness of one or several areas of the body; allergic reactions; (i.e.: anaphylactic reaction); and/or death. Furthermore, the patient was informed of those risks and complications associated with the medications. These include, but are not limited to: allergic reactions (i.e.: anaphylactic or anaphylactoid reaction(s)); adrenal axis suppression; blood sugar elevation that in diabetics may result in ketoacidosis or comma; water retention that in patients with history of congestive heart failure may result in shortness of breath, pulmonary edema, and decompensation with resultant heart failure; weight gain; swelling or edema; medication-induced neural toxicity; particulate matter embolism and blood vessel occlusion with resultant organ, and/or nervous system infarction; and/or aseptic necrosis of one or more  joints. Finally, the patient was informed that Medicine is not an exact science; therefore, there is also the possibility of unforeseen or unpredictable risks and/or possible complications that may result in a catastrophic outcome. The patient indicated having understood very clearly. We have given the patient no guarantees and we have made no promises. Enough time was given to the patient to ask questions, all of which were answered to the patient's satisfaction. Ms. Peroni has indicated that she wanted to continue with the procedure. Attestation: I, the ordering provider, attest that I have discussed with the patient the benefits, risks, side-effects, alternatives, likelihood of achieving goals, and potential problems during recovery for the procedure that I have provided informed consent. Date  Time: 07/18/2019  7:37 AM  Pre-Procedure Preparation:  Monitoring: As per clinic protocol. Respiration, ETCO2, SpO2, BP, heart rate and rhythm monitor placed and checked for adequate function Safety Precautions: Patient was assessed for positional comfort and pressure points before starting the procedure. Time-out: I initiated and conducted the "Time-out" before starting the procedure, as per protocol. The patient was asked to participate by confirming the accuracy of the "Time Out" information. Verification of the correct person, site, and procedure were performed and confirmed by me, the nursing staff, and the patient. "Time-out" conducted as per Joint Commission's Universal Protocol (UP.01.01.01). Time: 0829  Description of Procedure:          Target Area: For Genicular Nerve block(s), the targets are: the superior-lateral genicular nerve, located in the lateral distal portion of the femoral shaft as it curves to form the lateral epicondyle, in the region of the distal femoral metaphysis; the superior-medial genicular nerve, located in the medial distal portion of the femoral shaft as it curves to form the  medial epicondyle; and the inferior-medial genicular nerve, located in the medial, proximal portion of the tibial shaft, as it curves to form the medial epicondyle, in the region of the proximal tibial metaphysis. Approach: Anterior, ipsilateral approach. Area Prepped: Entire knee area, from mid-thigh to mid-shin, lateral, anterior, and medial aspects. Prepping solution: DuraPrep (Iodine Povacrylex [0.7% available iodine] and Isopropyl Alcohol, 74% w/w) Safety Precautions: Aspiration looking for blood return was  conducted prior to all injections. At no point did we inject any substances, as a needle was being advanced. No attempts were made at seeking any paresthesias. Safe injection practices and needle disposal techniques used. Medications properly checked for expiration dates. SDV (single dose vial) medications used. Description of the Procedure: Protocol guidelines were followed. The patient was placed in position over the procedure table. The target area was identified and the area prepped in the usual manner. The skin and muscle were infiltrated with local anesthetic. Appropriate amount of time allowed to pass for local anesthetics to take effect. Radiofrequency needles were introduced to the target area using fluoroscopic guidance. Using the NeuroTherm NT1100 Radiofrequency Generator, sensory stimulation using 50 Hz was used to locate & identify the nerve, making sure that the needle was positioned such that there was no sensory stimulation below 0.3 V or above 0.7 V. Stimulation using 2 Hz was used to evaluate the motor component. Care was taken not to lesion any nerves that demonstrated motor stimulation of the lower extremities at an output of less than 2.5 times that of the sensory threshold, or a maximum of 2.0 V. Once satisfactory placement of the needles was achieved, the numbing solution was slowly injected after negative aspiration. After waiting for at least 2 minutes, the ablation was  performed at 80 degrees C for 60 seconds, using regular Radiofrequency settings. Once the procedure was completed, the needles were then removed and the area cleansed, making sure to leave some of the prepping solution back to take advantage of its long term bactericidal properties. Intra-operative Compliance: Compliant Vitals:   07/18/19 0850 07/18/19 0900 07/18/19 0910 07/18/19 0920  BP: 121/85 (!) 136/115 115/69 123/70  Pulse:      Resp: 11 14 20 20   Temp:  (!) 97.2 F (36.2 C)  (!) 97.3 F (36.3 C)  SpO2: 98% 98% 99% 99%  Weight:      Height:        Start Time: 0829 hrs. End Time: 0849 hrs. Materials & Medications:  Needle(s) Type: Teflon-coated, curved tip, Radiofrequency needle(s) Gauge: 22G Length: 10cm Medication(s): Please see orders for medications and dosing details. 5 cc solution made of 4 cc of 0.2% ropivacaine, 1 cc of Decadron 10 mg/cc.  1.5 cc injected at each level above for the left knee after sensorimotor testing, prior to lesioning Imaging Guidance (Non-Spinal):          Type of Imaging Technique: Fluoroscopy Guidance (Non-Spinal) Indication(s): Assistance in needle guidance and placement for procedures requiring needle placement in or near specific anatomical locations not easily accessible without such assistance. Exposure Time: Please see nurses notes. Contrast: Before injecting any contrast, we confirmed that the patient did not have an allergy to iodine, shellfish, or radiological contrast. Once satisfactory needle placement was completed at the desired level, radiological contrast was injected. Contrast injected under live fluoroscopy. No contrast complications. See chart for type and volume of contrast used. Fluoroscopic Guidance: I was personally present during the use of fluoroscopy. "Tunnel Vision Technique" used to obtain the best possible view of the target area. Parallax error corrected before commencing the procedure. "Direction-depth-direction" technique  used to introduce the needle under continuous pulsed fluoroscopy. Once target was reached, antero-posterior, oblique, and lateral fluoroscopic projection used confirm needle placement in all planes. Images permanently stored in EMR. Interpretation: I personally interpreted the imaging intraoperatively. Adequate needle placement confirmed in multiple planes. Appropriate spread of contrast into desired area was observed. No evidence of afferent or efferent  intravascular uptake. Permanent images saved into the patient's record.  Antibiotic Prophylaxis:   Anti-infectives (From admission, onward)   None     Indication(s): None identified  Post-operative Assessment:  Post-procedure Vital Signs:  Pulse/HCG Rate: 8582 Temp: (!) 97.3 F (36.3 C) Resp: 20 BP: 123/70 SpO2: 99 %  EBL: None  Complications: No immediate post-treatment complications observed by team, or reported by patient.  Note: The patient tolerated the entire procedure well. A repeat set of vitals were taken after the procedure and the patient was kept under observation following institutional policy, for this type of procedure. Post-procedural neurological assessment was performed, showing return to baseline, prior to discharge. The patient was provided with post-procedure discharge instructions, including a section on how to identify potential problems. Should any problems arise concerning this procedure, the patient was given instructions to immediately contact us, at any time, without hesitation. In any case, we plan to contact the patient by telephone for a follow-up status report regarding this interventional procedure.  Comments:  No additional relevant information.  Plan of Care  Orders:  Orders Placed This Encounter  Procedures  . DG PA[facility] C-ARM 1-60 MIN NO REPORT    Intraoperative interpretation by procedural physician at [place].    Standing Status:   Standing    Number of Occurrences:   1     Order Specific Question:   Reason for exam:    Answer:   Assistance in needle guidance and placement for procedures requiring needle placement in or near specific anatomical locations not easily accessible without such assistance.  . Ambulatory referral to Psychology    Referral Priority:   Routine    Referral Type:   Psychiatric    Referral Reason:   Specialty Services Required    Referred to Provider:   Edgardo Roys, PsyD    Requested Specialty:   Psychology    Number of Visits Requested:   1   Medications ordered for procedure: Meds ordered this encounter  Medications  . lidocaine (XYLOCAINE) 2 % (with pres) injection 400 mg  . fentaNYL (SUBLIMAZE) injection 25-50 mcg    Make sure Narcan is available in the pyxis when using this medication. In the event of respiratory depression (RR< 8/min): Titrate NARCAN (naloxone) in increments of 0.1 to 0.2 mg IV at 2-3 minute intervals, until desired degree of reversal.  . dexamethasone (DECADRON) injection 10 mg  . ropivacaine (PF) 2 mg/mL (0.2%) (NAROPIN) injection 1 mL   Medications administered: We administered lidocaine, fentaNYL, dexamethasone, and ropivacaine (PF) 2 mg/mL (0.2%).  See the medical record for exact dosing, route, and time of administration.  Follow-up plan:   Return in about 6 weeks (around 08/29/2019) for Post Procedure Evaluation, virtual.      s/p L GN RFA on 7/20-helpful and TPI 7/20; status post left hip injection in greater trochanteric bursa injection with orthopedics [facility]) not helpful.  History of L5-S1 laminectomies, status post caudal ESI as well as cervical TPI on 05/28/2019.  PRN left knee genicular radiofrequency ablation.  Repeat on 07/18/2019. Referral to [facility] for SCS eval.       Recent Visits Date Type Provider Dept  07/02/19 Procedure visit Gillis Santa, MD [facility]  06/11/19 Procedure visit Gillis Santa, MD [facility]  06/07/19 Office Visit Gillis Santa, MD  [facility]  05/28/19 Procedure visit Gillis Santa, MD [facility]  05/14/19 Office Visit Gillis Santa, [facility]  04/30/19 Procedure visit  Gillis Santa, MD [facility]  Showing recent visits within past 90 days and meeting all other requirements   Today's Visits Date Type Provider Dept  07/18/19 Procedure visit Gillis Santa, MD [facility]  Showing today's visits and meeting all other requirements   Future Appointments Date Type Provider Dept  08/13/19 Appointment Gillis Santa, MD [facility]  08/29/19 Appointment Gillis Santa, MD [facility]  Showing future appointments within next 90 days and meeting all other requirements   Disposition: Discharge home  Discharge Date & Time: 07/18/2019; 0922 hrs.   Primary Care Physician: Aletha Halim., PA-C Location: [facility] Note by: Gillis Santa, MD Date: 07/18/2019; Time: 1:12 PM  Disclaimer:  Medicine is not an exact science. The only guarantee in medicine is that nothing is guaranteed. It is important to note that the decision to proceed with this intervention was based on the information collected from the patient. The Data and conclusions were drawn from the patient's questionnaire, the interview, and the physical examination. Because the information was provided in large part by the patient, it cannot be guaranteed that it has not been purposely or unconsciously manipulated. Every effort has been made to obtain as much relevant data as possible for this evaluation. It is important to note that the conclusions that lead to this procedure are derived in large part from the available data. Always take into account that the treatment will also be dependent on availability of resources and existing treatment guidelines, considered by other Pain Management Practitioners as being common knowledge and practice, at the time of the  intervention. For Medico-Legal purposes, it is also important to point out that variation in procedural techniques and pharmacological choices are the acceptable norm. The indications, contraindications, technique, and results of the above procedure should only be interpreted and judged by a Board-Certified Interventional Pain Specialist with extensive familiarity and expertise in the same exact procedure and technique.

## 2019-07-18 NOTE — Progress Notes (Deleted)
Patient's Name: Abigail [name]  MRN: [id number]  Referring Provider: Gillis Santa, MD  DOB: [birth year]  PCP: Aletha Halim., PA-C  DOS: 07/18/2019  Note by: Gillis Santa, MD  Service setting: Ambulatory outpatient  Specialty: Interventional Pain Management  Patient type: Established  Location: [facility] (AMB) Pain Management Facility  Visit type: Interventional Procedure   Primary Reason for Visit: Interventional Pain Management Treatment. CC: Knee Pain (left)  Procedure:          Anesthesia, Analgesia, Anxiolysis:  Type: Genicular Nerves Block (Superior-lateral, Superior-medial, and Inferior-medial Genicular Nerves) #1  CPT: [id number]      Primary Purpose: Diagnostic Region: Lateral, Anterior, and Medial aspects of the knee joint, above and below the knee joint proper. Level: Superior and inferior to the knee joint. Target Area: For Genicular Nerve block(s), the targets are: the superior-lateral genicular nerve, located in the lateral distal portion of the femoral shaft as it curves to form the lateral epicondyle, in the region of the distal femoral metaphysis; the superior-medial genicular nerve, located in the medial distal portion of the femoral shaft as it curves to form the medial epicondyle; and the inferior-medial genicular nerve, located in the medial, proximal portion of the tibial shaft, as it curves to form the medial epicondyle, in the region of the proximal tibial metaphysis. Approach: Anterior, percutaneous, ipsilateral approach. Laterality: Left knee  Type: Local Anesthesia with moderate sedation  Local Anesthetic: Lidocaine 1-2%  Position: Modified Fowler's position with pillows under the targeted knee(s).   Indications: 1. Chronic pain of left knee   2. Primary osteoarthritis of left knee    Pain Score: Pre-procedure: 7 /10 Post-procedure: 7 /10  Pre-op Assessment:  Abigail [name] is a 53 y.o. (year old), female patient, seen today for interventional treatment. She  has a  past surgical history that includes left foot bone spur surgery (10 yrs ago); Abdominal hysterectomy (14 yrs ago); Lumbar laminectomy/decompression microdiscectomy (07/13/2012); Lumbar laminectomy/decompression microdiscectomy (Right, 03/07/2013); Hemi-microdiscectomy lumbar laminectomy level 1 (N/A, 09/28/2013); Breast biopsy (Left, 04/10/2015); Cholecystectomy; Eye surgery; Back surgery; and Anterior cervical decompression/discectomy fusion 4 level (N/A, 10/18/2018). Abigail [name] has a current medication list which includes the following prescription(s): acetaminophen, alprazolam, amitriptyline, amlodipine-olmesartan, atorvastatin, celecoxib, cholecalciferol, clonazepam, duloxetine, fluticasone, fremanezumab-vfrm, hydrocortisone, methocarbamol, metoprolol succinate, nortriptyline, prednisone, pregabalin, rizatriptan, and tizanidine, and the following Facility-Administered Medications: dexamethasone, fentanyl, lidocaine, and ropivacaine (pf) 2 mg/ml (0.2%). Her primarily concern today is the Knee Pain (left)  Initial Vital Signs:  Pulse/HCG Rate: 85  Temp: 97.9 F (36.6 C) Resp: 16 BP: (!) 132/97 SpO2: 98 %  BMI: Estimated body mass index is 27.79 kg/m as calculated from the following:   Height as of this encounter: 5\' 5"  (1.651 m).   Weight as of this encounter: 167 lb (75.8 kg).  Risk Assessment: Allergies: Reviewed. She has No Known Allergies.  Allergy Precautions: None required Coagulopathies: Reviewed. None identified.  Blood-thinner therapy: None at this time Active Infection(s): Reviewed. None identified. Abigail [name] is afebrile  Site Confirmation: Abigail [name] was asked to confirm the procedure and laterality before marking the site Procedure checklist: Completed Consent: Before the procedure and under the influence of no sedative(s), amnesic(s), or anxiolytics, the patient was informed of the treatment options, risks and possible complications. To fulfill our ethical and legal  obligations, as recommended by the American Medical Association's Code of Ethics, I have informed the patient of my clinical impression; the nature and purpose of the treatment or procedure; the risks, benefits, and possible complications of the  intervention; the alternatives, including doing nothing; the risk(s) and benefit(s) of the alternative treatment(s) or procedure(s); and the risk(s) and benefit(s) of doing nothing. The patient was provided information about the general risks and possible complications associated with the procedure. These may include, but are not limited to: failure to achieve desired goals, infection, bleeding, organ or nerve damage, allergic reactions, paralysis, and death. In addition, the patient was informed of those risks and complications associated to the procedure, such as failure to decrease pain; infection; bleeding; organ or nerve damage with subsequent damage to sensory, motor, and/or autonomic systems, resulting in permanent pain, numbness, and/or weakness of one or several areas of the body; allergic reactions; (i.e.: anaphylactic reaction); and/or death. Furthermore, the patient was informed of those risks and complications associated with the medications. These include, but are not limited to: allergic reactions (i.e.: anaphylactic or anaphylactoid reaction(s)); adrenal axis suppression; blood sugar elevation that in diabetics may result in ketoacidosis or comma; water retention that in patients with history of congestive heart failure may result in shortness of breath, pulmonary edema, and decompensation with resultant heart failure; weight gain; swelling or edema; medication-induced neural toxicity; particulate matter embolism and blood vessel occlusion with resultant organ, and/or nervous system infarction; and/or aseptic necrosis of one or more joints. Finally, the patient was informed that Medicine is not an exact science; therefore, there is also the possibility of  unforeseen or unpredictable risks and/or possible complications that may result in a catastrophic outcome. The patient indicated having understood very clearly. We have given the patient no guarantees and we have made no promises. Enough time was given to the patient to ask questions, all of which were answered to the patient's satisfaction. Ms. On has indicated that she wanted to continue with the procedure. Attestation: I, the ordering provider, attest that I have discussed with the patient the benefits, risks, side-effects, alternatives, likelihood of achieving goals, and potential problems during recovery for the procedure that I have provided informed consent. Date   Time: 07/18/2019  7:37 AM  Pre-Procedure Preparation:  Monitoring: As per clinic protocol. Respiration, ETCO2, SpO2, BP, heart rate and rhythm monitor placed and checked for adequate function Safety Precautions: Patient was assessed for positional comfort and pressure points before starting the procedure. Time-out: I initiated and conducted the "Time-out" before starting the procedure, as per protocol. The patient was asked to participate by confirming the accuracy of the "Time Out" information. Verification of the correct person, site, and procedure were performed and confirmed by me, the nursing staff, and the patient. "Time-out" conducted as per Joint Commission's Universal Protocol (UP.01.01.01). Time:    Description of Procedure:          Area Prepped: Entire knee area, from mid-thigh to mid-shin, lateral, anterior, and medial aspects. Prepping solution: DuraPrep (Iodine Povacrylex [0.7% available iodine] and Isopropyl Alcohol, 74% w/w) Safety Precautions: Aspiration looking for blood return was conducted prior to all injections. At no point did we inject any substances, as a needle was being advanced. No attempts were made at seeking any paresthesias. Safe injection practices and needle disposal techniques used. Medications  properly checked for expiration dates. SDV (single dose vial) medications used. Description of the Procedure: Protocol guidelines were followed. The patient was placed in position over the procedure table. The target area was identified and the area prepped in the usual manner. Skin & deeper tissues infiltrated with local anesthetic. Appropriate amount of time allowed to pass for local anesthetics to take effect. The procedure needles  were then advanced to the target area. Proper needle placement secured. Negative aspiration confirmed. Solution injected in intermittent fashion, asking for systemic symptoms every 0.5cc of injectate. The needles were then removed and the area cleansed, making sure to leave some of the prepping solution back to take advantage of its long term bactericidal properties.  Vitals:   07/18/19 0751 07/18/19 0752  BP:  (!) 132/97  Pulse:  85  Resp:  16  Temp: 97.9 F (36.6 C)   SpO2:  98%  Weight: 167 lb (75.8 kg)   Height: 5\' 5"  (1.651 m)     Start Time:   hrs. End Time:   hrs. Materials:  Needle(s) Type: Spinal Needle Gauge: 22G Length: 3.5-in Medication(s): Please see orders for medications and dosing details. 5 cc solution made of 4 cc of 0.2% ropivacaine, 1 cc of Decadron 10 mg/cc.  1.5 cc injected at each level on the left  Imaging Guidance (Non-Spinal):          Type of Imaging Technique: Fluoroscopy Guidance (Non-Spinal) Indication(s): Assistance in needle guidance and placement for procedures requiring needle placement in or near specific anatomical locations not easily accessible without such assistance. Exposure Time: Please see nurses notes. Contrast: None used. Fluoroscopic Guidance: I was personally present during the use of fluoroscopy. "Tunnel Vision Technique" used to obtain the best possible view of the target area. Parallax error corrected before commencing the procedure. "Direction-depth-direction" technique used to introduce the needle under  continuous pulsed fluoroscopy. Once target was reached, antero-posterior, oblique, and lateral fluoroscopic projection used confirm needle placement in all planes. Images permanently stored in EMR. Interpretation: No contrast injected. I personally interpreted the imaging intraoperatively. Adequate needle placement confirmed in multiple planes. Permanent images saved into the patient's record.   Plan of Care  Orders:  Orders Placed This Encounter  Procedures   DG PAIN CLINIC C-ARM 1-60 MIN NO REPORT    Intraoperative interpretation by procedural physician at [place].    Standing Status:   Standing    Number of Occurrences:   1    Order Specific Question:   Reason for exam:    Answer:   Assistance in needle guidance and placement for procedures requiring needle placement in or near specific anatomical locations not easily accessible without such assistance.   Medications ordered for procedure: Meds ordered this encounter  Medications   lidocaine (XYLOCAINE) 2 % (with pres) injection 400 mg   fentaNYL (SUBLIMAZE) injection 25-50 mcg    Make sure Narcan is available in the pyxis when using this medication. In the event of respiratory depression (RR< 8/min): Titrate NARCAN (naloxone) in increments of 0.1 to 0.2 mg IV at 2-3 minute intervals, until desired degree of reversal.   dexamethasone (DECADRON) injection 10 mg   ropivacaine (PF) 2 mg/mL (0.2%) (NAROPIN) injection 1 mL   Medications administered: Prudy L. Mackall had no medications administered during this visit.  See the medical record for exact dosing, route, and time of administration.  Disposition: Discharge home  Discharge Date & Time: 07/18/2019;   hrs.   Follow-up plan:   Return in about 6 weeks (around 08/29/2019) for Post Procedure Evaluation, virtual.     Future Appointments  Date Time Provider [place]  07/18/2019  8:20 AM [id number]  08/13/2019  9:00 AM Gillis Santa, MD  [facility] None   Primary Care Physician: Aletha Halim., PA-C Location: [facility] Note by: Gillis Santa, MD Date: 07/18/2019; Time: 8:18 AM  Disclaimer:  Medicine is not an Chief Strategy Officer. The only guarantee in medicine is that nothing is guaranteed. It is important to note that the decision to proceed with this intervention was based on the information collected from the patient. The Data and conclusions were drawn from the patient's questionnaire, the interview, and the physical examination. Because the information was provided in large part by the patient, it cannot be guaranteed that it has not been purposely or unconsciously manipulated. Every effort has been made to obtain as much relevant data as possible for this evaluation. It is important to note that the conclusions that lead to this procedure are derived in large part from the available data. Always take into account that the treatment will also be dependent on availability of resources and existing treatment guidelines, considered by other Pain Management Practitioners as being common knowledge and practice, at the time of the intervention. For Medico-Legal purposes, it is also important to point out that variation in procedural techniques and pharmacological choices are the acceptable norm. The indications, contraindications, technique, and results of the above procedure should only be interpreted and judged by a Board-Certified Interventional Pain Specialist with extensive familiarity and expertise in the same exact procedure and technique.

## 2019-07-19 ENCOUNTER — Telehealth: Payer: Self-pay

## 2019-07-19 ENCOUNTER — Encounter: Payer: Self-pay | Admitting: Student in an Organized Health Care Education/Training Program

## 2019-07-19 NOTE — Telephone Encounter (Signed)
Pt was called and no problem was reported. 

## 2019-07-20 ENCOUNTER — Encounter: Payer: Self-pay | Admitting: Psychology

## 2019-07-23 ENCOUNTER — Encounter: Payer: Self-pay | Admitting: Student in an Organized Health Care Education/Training Program

## 2019-07-23 DIAGNOSIS — M961 Postlaminectomy syndrome, not elsewhere classified: Secondary | ICD-10-CM

## 2019-07-23 DIAGNOSIS — M1711 Unilateral primary osteoarthritis, right knee: Secondary | ICD-10-CM

## 2019-07-24 ENCOUNTER — Encounter: Payer: Self-pay | Admitting: Student in an Organized Health Care Education/Training Program

## 2019-07-24 NOTE — Telephone Encounter (Signed)
Order placed for right genicular nerve RFA.  I have contacted Dr. Sima Matas regarding psych evaluation for SCS trial.  It is surprising that patient was given a date of February for psych evaluation.  Will send referral to other another Psychologist.

## 2019-08-02 ENCOUNTER — Encounter

## 2019-08-02 ENCOUNTER — Telehealth: Payer: Self-pay

## 2019-08-02 ENCOUNTER — Other Ambulatory Visit: Payer: Self-pay

## 2019-08-02 ENCOUNTER — Encounter: Payer: Self-pay | Admitting: Psychology

## 2019-08-02 ENCOUNTER — Encounter: Payer: Medicare Other | Attending: Psychology | Admitting: Psychology

## 2019-08-02 DIAGNOSIS — Z981 Arthrodesis status: Secondary | ICD-10-CM | POA: Insufficient documentation

## 2019-08-02 DIAGNOSIS — M545 Low back pain: Secondary | ICD-10-CM | POA: Insufficient documentation

## 2019-08-02 DIAGNOSIS — G8929 Other chronic pain: Secondary | ICD-10-CM | POA: Diagnosis present

## 2019-08-02 DIAGNOSIS — F1190 Opioid use, unspecified, uncomplicated: Secondary | ICD-10-CM | POA: Insufficient documentation

## 2019-08-02 DIAGNOSIS — I10 Essential (primary) hypertension: Secondary | ICD-10-CM | POA: Insufficient documentation

## 2019-08-02 DIAGNOSIS — G894 Chronic pain syndrome: Secondary | ICD-10-CM | POA: Diagnosis present

## 2019-08-02 DIAGNOSIS — E2740 Unspecified adrenocortical insufficiency: Secondary | ICD-10-CM | POA: Insufficient documentation

## 2019-08-02 DIAGNOSIS — F418 Other specified anxiety disorders: Secondary | ICD-10-CM | POA: Insufficient documentation

## 2019-08-02 DIAGNOSIS — Z79899 Other long term (current) drug therapy: Secondary | ICD-10-CM | POA: Diagnosis not present

## 2019-08-02 DIAGNOSIS — M5412 Radiculopathy, cervical region: Secondary | ICD-10-CM

## 2019-08-02 NOTE — Progress Notes (Signed)
Neuropsychological Consultation   Patient:   Abigail [name]   DOB:   1965-11-26  MR Number:  [id number]  Location:  [place] [facility] [id number] [place] [id number] Dept: [phone]           Date of Service:   08/02/2019  Start Time:   3 PM End Time:   4 PM  Provider/Observer:  Ilean Skill, Psy.D.       Clinical Neuropsychologist       Billing Code/Service: Psychological diagnostic clinical interview  Chief Complaint:    Abigail [name] is a 53 year old female referred by Dr. Holley Raring for psychological evaluation as part of the standard work-up/protocol for consideration of spinal cord stimulator trialing and possible implantation.  The patient has a significant past medical history including adrenal cortex insufficiency, arthritis, complications from anesthesia, headache, hypertension, depression, anxiety and syncopal episodes.  The patient's complete medical history can be found in her electronic medical records.  The patient also has had 3 prior back surgeries as well as cervical surgeries in the past.  The patient had a lumbar laminectomy/decompression microdiscectomy in 2013.  Lumbar laminectomy/decompression microdiscectomy in 2014 and hemimicrodiscectomy lumbar laminectomy level 1 and 2014.  Other surgeries have included hysterectomy, breast biopsy, cholecystectomy, eye surgery.  Her most recent surgery was an anterior cervical decompression/discectomy fusion for level.  The patient is continued with significant back pain particularly in her lower back.  Reason for Service:  Abigail [name] is a 53 year old female referred by Dr. Holley Raring for psychological evaluation as part of the standard work-up/protocol for consideration of spinal cord stimulator trialing and possible implantation.  The patient has a significant past medical history including adrenal cortex  insufficiency, arthritis, complications from anesthesia, headache, hypertension, depression, anxiety and syncopal episodes.  The patient's complete medical history can be found in her electronic medical records.  The patient also has had 3 prior back surgeries as well as cervical surgeries in the past.  The patient had a lumbar laminectomy/decompression microdiscectomy in 2013.  Lumbar laminectomy/decompression microdiscectomy in 2014 and hemimicrodiscectomy lumbar laminectomy level 1 and 2014.  Other surgeries have included hysterectomy, breast biopsy, cholecystectomy, eye surgery.  Her most recent surgery was an anterior cervical decompression/discectomy fusion for level.  The patient is continued with significant back pain particularly in her lower back.  The patient reports that she is continued to have constant pain/chronic pain which is related by the patient to be connected to her L5-S1 issues.  The patient describes pain going down her leg and at one point it was thought that she was having some hip issues but ultimately they realized it was related to nerve root impingement.  The patient is to tried different interventions beyond pharmacological treatments including shots in her back.  The patient also has significant knee pain that is being treated by Dr. Holley Raring as well.  The patient reports that she has had issues with depression anxiety for years but is being maintained on benzodiazepine as well as SSRI medications for these issues and feel like they are well managed at this point.  The patient also describes issues with tremors, lightheadedness and arthritis.  The patient reports that there are no major psychosocial factors that are particularly problematic at this point.  The patient reports that her current (second marriage) marriage is going quite well and he is very good and they have a stable relationship.  The patient does  report that her grandson passed away in the recent past and it is  been particularly problematic for her daughter and the grandson's twin brother.  She reports that this is 1 stressor for her but it is not overwhelming.  The patient reports that she does have difficulty with her sleep pattern and she associates this primarily with her chronic significant pain issues.  The patient acknowledges some issues with memory deficits but the degree of pain and use of benzodiazepines likely play a role in this.  The patient reports that these difficulties make it very hard for her to do much activities around the house and help with household issues.  Reliability of Information: The information is derived from 1 hour face-to-face clinical interview as well as a review of various medical records.  Behavioral Observation: Abigail [name]  presents as a 53 y.o.-year-old Right Caucasian Female who appeared her stated age. her dress was Appropriate and she was Well Groomed and her manners were Appropriate to the situation.  her participation was indicative of Appropriate and Attentive behaviors.  There were any physical disabilities noted.  she displayed an appropriate level of cooperation and motivation.     Interactions:    Active Appropriate and Attentive  Attention:   within normal limits and attention span and concentration were age appropriate  Memory:   within normal limits; recent and remote memory intact  Visuo-spatial:  not examined  Speech (Volume):  normal  Speech:   normal; normal  Thought Process:  Coherent and Relevant  Though Content:  WNL; not suicidal and not homicidal  Orientation:   person, place, time/date and situation  Judgment:   Fair  Planning:   Fair  Affect:    Anxious  Mood:    Anxious  Insight:   Good  Intelligence:   normal  Marital Status/Living: The patient reports that she was born and raised in [place] and had 2 younger sisters.  The patient is currently living with her second husband and they have been  together for 10 years.  The patient was married previously for 14-1/2 years.  The patient has a 43-1/2-year-old son as well as a 9 year old daughter.    Current Employment: The patient is disabled but does as much as she can around the house.  Financially speaking, the patient reports that they are not fairly stable financial situation.  Past Employment:  The patient worked previously for [employer] and worked there for 28 years.  The patient reports that she had to quit doing this job because of back surgeries and residual difficulties.  Hobbies and interests include cross-stitch, reading and spending time with her family.  Substance Use:  The patient does have a long history of benzodiazepine use for her anxiety as well as opiate use for her significant pain issues.  It does not appear that she has used these medications outside of prescription guidelines.  The patient also has used marijuana at times to help manage her pain.  She does use tobacco products.  Education:   HS Graduate  Medical History:   Past Medical History:  Diagnosis Date  . Adrenal cortex insufficiency ([place])   . Arthritis    arthritis-back, weakness in right leg  . Complication of anesthesia    headache when come out of anesthesia  . Headache(784.0)    occ. migraines, not frequent  . Hypertension   . Syncope     Psychiatric History:  The patient does have a prior psychiatric history  related to depression anxiety that proceeded some of these issues with nerve root impingement in her neck pain/back pain.  Family Med/Psych History:  Family History  Problem Relation Age of Onset  . Ataxia Neg Hx   . Chorea Neg Hx   . Dementia Neg Hx   . Mental retardation Neg Hx   . Migraines Neg Hx   . Multiple sclerosis Neg Hx   . Neurofibromatosis Neg Hx   . Neuropathy Neg Hx   . Parkinsonism Neg Hx   . Seizures Neg Hx   . Stroke Neg Hx     Risk of Suicide/Violence: low the patient denies any suicidal homicidal  ideation.  Impression/DX:  Paislie Perotti is a 53 year old female referred by Dr. Holley Raring for psychological evaluation as part of the standard work-up/protocol for consideration of spinal cord stimulator trialing and possible implantation.  The patient has a significant past medical history including adrenal cortex insufficiency, arthritis, complications from anesthesia, headache, hypertension, depression, anxiety and syncopal episodes.  The patient's complete medical history can be found in her electronic medical records.  The patient also has had 3 prior back surgeries as well as cervical surgeries in the past.  The patient had a lumbar laminectomy/decompression microdiscectomy in 2013.  Lumbar laminectomy/decompression microdiscectomy in 2014 and hemimicrodiscectomy lumbar laminectomy level 1 and 2014.  Other surgeries have included hysterectomy, breast biopsy, cholecystectomy, eye surgery.  Her most recent surgery was an anterior cervical decompression/discectomy fusion for level.  The patient is continued with significant back pain particularly in her lower back.   Disposition/Plan:  The patient has been provided the MMPI-2 as well as the pain patient profile for completion.  Once these objective psychological measures have been completed they will be scored and interpreted and a formal report will be written and provided to Dr. Holley Raring.  Diagnosis:    Chronic pain syndrome  Chronic bilateral low back pain without sciatica  Depression with anxiety  Long term prescription benzodiazepine use  Opiate use  Cervical radiculopathy         Electronically Signed   _______________________ Ilean Skill, Psy.D.

## 2019-08-02 NOTE — Telephone Encounter (Signed)
She wants to get a genicular RFA on her right knee now. Is this ok to schedule?

## 2019-08-02 NOTE — Telephone Encounter (Signed)
Yes per Dr. Holley Raring. Please call her and tell her to arrive at 0830 instead of 9. Thank you- Anderson Malta

## 2019-08-09 ENCOUNTER — Encounter: Payer: Medicare Other | Admitting: Psychology

## 2019-08-13 ENCOUNTER — Ambulatory Visit
Admission: RE | Admit: 2019-08-13 | Discharge: 2019-08-13 | Disposition: A | Discharge status: Home / Self Care | Payer: Medicare Other | Source: Ambulatory Visit | Attending: Student in an Organized Health Care Education/Training Program | Admitting: Student in an Organized Health Care Education/Training Program

## 2019-08-13 ENCOUNTER — Other Ambulatory Visit: Payer: Self-pay

## 2019-08-13 ENCOUNTER — Encounter: Payer: Self-pay | Admitting: Student in an Organized Health Care Education/Training Program

## 2019-08-13 ENCOUNTER — Ambulatory Visit (HOSPITAL_BASED_OUTPATIENT_CLINIC_OR_DEPARTMENT_OTHER): Payer: Medicare Other | Admitting: Student in an Organized Health Care Education/Training Program

## 2019-08-13 VITALS — BP 131/84 | HR 97 | Temp 98.0°F | Resp 16 | Ht 65.0 in | Wt 165.0 lb

## 2019-08-13 DIAGNOSIS — M542 Cervicalgia: Secondary | ICD-10-CM | POA: Diagnosis not present

## 2019-08-13 DIAGNOSIS — Z90710 Acquired absence of both cervix and uterus: Secondary | ICD-10-CM | POA: Insufficient documentation

## 2019-08-13 DIAGNOSIS — Z9049 Acquired absence of other specified parts of digestive tract: Secondary | ICD-10-CM | POA: Diagnosis not present

## 2019-08-13 DIAGNOSIS — Z79899 Other long term (current) drug therapy: Secondary | ICD-10-CM | POA: Diagnosis not present

## 2019-08-13 DIAGNOSIS — Z7952 Long term (current) use of systemic steroids: Secondary | ICD-10-CM | POA: Insufficient documentation

## 2019-08-13 DIAGNOSIS — M4322 Fusion of spine, cervical region: Secondary | ICD-10-CM | POA: Diagnosis not present

## 2019-08-13 DIAGNOSIS — M1711 Unilateral primary osteoarthritis, right knee: Secondary | ICD-10-CM | POA: Insufficient documentation

## 2019-08-13 DIAGNOSIS — M7918 Myalgia, other site: Secondary | ICD-10-CM | POA: Diagnosis not present

## 2019-08-13 MED ORDER — DEXAMETHASONE SODIUM PHOSPHATE 10 MG/ML IJ SOLN
10.0000 mg | Freq: Once | INTRAMUSCULAR | Status: AC
  Administered 2019-08-13: 10 mg
  Filled 2019-08-13: qty 1

## 2019-08-13 MED ORDER — LIDOCAINE HCL 2 % IJ SOLN
20.0000 mL | Freq: Once | INTRAMUSCULAR | Status: AC
  Administered 2019-08-13: 400 mg
  Filled 2019-08-13: qty 20

## 2019-08-13 MED ORDER — MIDAZOLAM HCL 5 MG/5ML IJ SOLN
1.0000 mg | INTRAMUSCULAR | Status: DC | PRN
  Filled 2019-08-13: qty 5

## 2019-08-13 MED ORDER — ROPIVACAINE HCL 2 MG/ML IJ SOLN
1.0000 mL | Freq: Once | INTRAMUSCULAR | Status: AC
  Administered 2019-08-13: 10 mL via EPIDURAL
  Filled 2019-08-13: qty 10

## 2019-08-13 MED ORDER — FENTANYL CITRATE (PF) 100 MCG/2ML IJ SOLN
25.0000 ug | INTRAMUSCULAR | Status: DC | PRN
  Administered 2019-08-13: 75 ug via INTRAVENOUS
  Filled 2019-08-13: qty 2

## 2019-08-13 MED ORDER — ROPIVACAINE HCL 2 MG/ML IJ SOLN
INTRAMUSCULAR | Status: AC
  Filled 2019-08-13: qty 10

## 2019-08-13 NOTE — Patient Instructions (Signed)

## 2019-08-13 NOTE — Progress Notes (Signed)
Patient's Name: Abigail [name]  MRN: [id number]  Referring Provider: Aletha Halim., PA-C  DOB: Nov 24, 1965  PCP: Aletha Halim., PA-C  DOS: 08/13/2019  Note by: Gillis Santa, MD  Service setting: Ambulatory outpatient  Specialty: Interventional Pain Management  Patient type: Established  Location: [facility] (AMB) Pain Management Facility  Visit type: Interventional Procedure   Primary Reason for Visit: Interventional Pain Management Treatment. CC: Knee Pain (right) and Neck Pain  Procedure:          Anesthesia, Analgesia, Anxiolysis:  Type: Therapeutic Superior-lateral, Superior-medial, and Inferior-medial, Genicular Nerve Radiofrequency Ablation.  #1  Region: Lateral, Anterior, and Medial aspects of the knee joint, above and below the knee joint proper. Level: Superior and inferior to the knee joint. Laterality: Right  Type: Moderate (Conscious) Sedation combined with Local Anesthesia Indication(s): Analgesia and Anxiety Route: Intravenous (IV) IV Access: Secured Sedation: Meaningful verbal contact was maintained at all times during the procedure  Local Anesthetic: Lidocaine 1-2%  Position: Supine   Indications: 1. Primary osteoarthritis of right knee    Abigail [name] has been dealing with the above chronic pain for longer than three months and has either failed to respond, was unable to tolerate, or simply did not get enough benefit from other more conservative therapies including, but not limited to: 1. Over-the-counter medications 2. Anti-inflammatory medications 3. Muscle relaxants 4. Membrane stabilizers 5. Opioids 6. Physical therapy and/or chiropractic manipulation 7. Modalities (Heat, ice, etc.) 8. Invasive techniques such as nerve blocks. Abigail [name] has attained more than 50% relief of the pain from a series of diagnostic injections conducted in separate occasions.  Pain Score: Pre-procedure: 10-Worst pain ever/10 Post-procedure: 0-No pain/10  Pre-op Assessment:   Abigail [name] is a 53 y.o. (year old), female patient, seen today for interventional treatment. She  has a past surgical history that includes left foot bone spur surgery (10 yrs ago); Abdominal hysterectomy (14 yrs ago); Lumbar laminectomy/decompression microdiscectomy (07/13/2012); Lumbar laminectomy/decompression microdiscectomy (Right, 03/07/2013); Hemi-microdiscectomy lumbar laminectomy level 1 (N/A, 09/28/2013); Breast biopsy (Left, 04/10/2015); Cholecystectomy; Eye surgery; Back surgery; and Anterior cervical decompression/discectomy fusion 4 level (N/A, 10/18/2018). Abigail [name] has a current medication list which includes the following prescription(s): acetaminophen, amlodipine-olmesartan, atorvastatin, celecoxib, cholecalciferol, clonazepam, duloxetine, fluticasone, fremanezumab-vfrm, hydrocortisone, methocarbamol, metoprolol succinate, nortriptyline, prednisone, pregabalin, rizatriptan, tizanidine, alprazolam, and amitriptyline, and the following Facility-Administered Medications: fentanyl and midazolam. Her primarily concern today is the Knee Pain (right) and Neck Pain  Initial Vital Signs:  Pulse/HCG Rate: 97ECG Heart Rate: 79 Temp: 98.2 F (36.8 C) Resp: 18 BP: (!) 107/92(took BP med this am) SpO2: 100 %  BMI: Estimated body mass index is 27.46 kg/m as calculated from the following:   Height as of this encounter: 5\' 5"  (1.651 m).   Weight as of this encounter: 165 lb (74.8 kg).  Risk Assessment: Allergies: Reviewed. She has No Known Allergies.  Allergy Precautions: None required Coagulopathies: Reviewed. None identified.  Blood-thinner therapy: None at this time Active Infection(s): Reviewed. None identified. Abigail [name] is afebrile  Site Confirmation: Abigail [name] was asked to confirm the procedure and laterality before marking the site Procedure checklist: Completed Consent: Before the procedure and under the influence of no sedative(s), amnesic(s), or anxiolytics, the patient  was informed of the treatment options, risks and possible complications. To fulfill our ethical and legal obligations, as recommended by the American Medical Association's Code of Ethics, I have informed the patient of my clinical impression; the nature and purpose of the treatment or procedure; the risks, benefits,  and possible complications of the intervention; the alternatives, including doing nothing; the risk(s) and benefit(s) of the alternative treatment(s) or procedure(s); and the risk(s) and benefit(s) of doing nothing. The patient was provided information about the general risks and possible complications associated with the procedure. These may include, but are not limited to: failure to achieve desired goals, infection, bleeding, organ or nerve damage, allergic reactions, paralysis, and death. In addition, the patient was informed of those risks and complications associated to the procedure, such as failure to decrease pain; infection; bleeding; organ or nerve damage with subsequent damage to sensory, motor, and/or autonomic systems, resulting in permanent pain, numbness, and/or weakness of one or several areas of the body; allergic reactions; (i.e.: anaphylactic reaction); and/or death. Furthermore, the patient was informed of those risks and complications associated with the medications. These include, but are not limited to: allergic reactions (i.e.: anaphylactic or anaphylactoid reaction(s)); adrenal axis suppression; blood sugar elevation that in diabetics may result in ketoacidosis or comma; water retention that in patients with history of congestive heart failure may result in shortness of breath, pulmonary edema, and decompensation with resultant heart failure; weight gain; swelling or edema; medication-induced neural toxicity; particulate matter embolism and blood vessel occlusion with resultant organ, and/or nervous system infarction; and/or aseptic necrosis of one or more joints. Finally,  the patient was informed that Medicine is not an exact science; therefore, there is also the possibility of unforeseen or unpredictable risks and/or possible complications that may result in a catastrophic outcome. The patient indicated having understood very clearly. We have given the patient no guarantees and we have made no promises. Enough time was given to the patient to ask questions, all of which were answered to the patient's satisfaction. Ms. Shawn has indicated that she wanted to continue with the procedure. Attestation: I, the ordering provider, attest that I have discussed with the patient the benefits, risks, side-effects, alternatives, likelihood of achieving goals, and potential problems during recovery for the procedure that I have provided informed consent. Date  Time: 08/13/2019  9:52 AM  Pre-Procedure Preparation:  Monitoring: As per clinic protocol. Respiration, ETCO2, SpO2, BP, heart rate and rhythm monitor placed and checked for adequate function Safety Precautions: Patient was assessed for positional comfort and pressure points before starting the procedure. Time-out: I initiated and conducted the "Time-out" before starting the procedure, as per protocol. The patient was asked to participate by confirming the accuracy of the "Time Out" information. Verification of the correct person, site, and procedure were performed and confirmed by me, the nursing staff, and the patient. "Time-out" conducted as per Joint Commission's Universal Protocol (UP.01.01.01). Time: 0910  Description of Procedure:          Target Area: For Genicular Nerve block(s), the targets are: the superior-lateral genicular nerve, located in the lateral distal portion of the femoral shaft as it curves to form the lateral epicondyle, in the region of the distal femoral metaphysis; the superior-medial genicular nerve, located in the medial distal portion of the femoral shaft as it curves to form the medial  epicondyle; and the inferior-medial genicular nerve, located in the medial, proximal portion of the tibial shaft, as it curves to form the medial epicondyle, in the region of the proximal tibial metaphysis. Approach: Anterior, ipsilateral approach. Area Prepped: Entire knee area, from mid-thigh to mid-shin, lateral, anterior, and medial aspects. Prepping solution: DuraPrep (Iodine Povacrylex [0.7% available iodine] and Isopropyl Alcohol, 74% w/w) Safety Precautions: Aspiration looking for blood return was conducted prior to  all injections. At no point did we inject any substances, as a needle was being advanced. No attempts were made at seeking any paresthesias. Safe injection practices and needle disposal techniques used. Medications properly checked for expiration dates. SDV (single dose vial) medications used. Description of the Procedure: Protocol guidelines were followed. The patient was placed in position over the procedure table. The target area was identified and the area prepped in the usual manner. The skin and muscle were infiltrated with local anesthetic. Appropriate amount of time allowed to pass for local anesthetics to take effect. Radiofrequency needles were introduced to the target area using fluoroscopic guidance. Using the NeuroTherm NT1100 Radiofrequency Generator, sensory stimulation using 50 Hz was used to locate & identify the nerve, making sure that the needle was positioned such that there was no sensory stimulation below 0.3 V or above 0.7 V. Stimulation using 2 Hz was used to evaluate the motor component. Care was taken not to lesion any nerves that demonstrated motor stimulation of the lower extremities at an output of less than 2.5 times that of the sensory threshold, or a maximum of 2.0 V. Once satisfactory placement of the needles was achieved, the numbing solution was slowly injected after negative aspiration. After waiting for at least 2 minutes, the ablation was performed at 80  degrees C for 60 seconds, using regular Radiofrequency settings. Once the procedure was completed, the needles were then removed and the area cleansed, making sure to leave some of the prepping solution back to take advantage of its long term bactericidal properties. Intra-operative Compliance: Compliant Vitals:   08/13/19 0944 08/13/19 0945 08/13/19 0955 08/13/19 1005  BP: 114/85 124/84 124/87 131/84  Pulse:      Resp: 18 16 17 16   Temp:    98 F (36.7 C)  TempSrc:    Temporal  SpO2: 98% 96% 98% 99%  Weight:      Height:        Start Time: 0910 hrs. End Time: 0945 hrs. Materials & Medications:  Needle(s) Type: Teflon-coated, curved tip, Radiofrequency needle(s) Gauge: 22G Length: 10cm Medication(s): Please see orders for medications and dosing details. 5 cc solution made of 4 cc of 0.2% ropivacaine, 1 cc of Decadron 10 mg/cc.  1.5 cc injected at each level above for the left knee after sensorimotor testing, prior to lesioning Imaging Guidance (Non-Spinal):          Type of Imaging Technique: Fluoroscopy Guidance (Non-Spinal) Indication(s): Assistance in needle guidance and placement for procedures requiring needle placement in or near specific anatomical locations not easily accessible without such assistance. Exposure Time: Please see nurses notes. Contrast: Before injecting any contrast, we confirmed that the patient did not have an allergy to iodine, shellfish, or radiological contrast. Once satisfactory needle placement was completed at the desired level, radiological contrast was injected. Contrast injected under live fluoroscopy. No contrast complications. See chart for type and volume of contrast used. Fluoroscopic Guidance: I was personally present during the use of fluoroscopy. "Tunnel Vision Technique" used to obtain the best possible view of the target area. Parallax error corrected before commencing the procedure. "Direction-depth-direction" technique used to introduce the  needle under continuous pulsed fluoroscopy. Once target was reached, antero-posterior, oblique, and lateral fluoroscopic projection used confirm needle placement in all planes. Images permanently stored in EMR. Interpretation: I personally interpreted the imaging intraoperatively. Adequate needle placement confirmed in multiple planes. Appropriate spread of contrast into desired area was observed. No evidence of afferent or efferent intravascular uptake. Permanent  images saved into the patient's record.  Antibiotic Prophylaxis:   Anti-infectives (From admission, onward)   None     Indication(s): None identified  Post-operative Assessment:  Post-procedure Vital Signs:  Pulse/HCG Rate: 9790 Temp: 98 F (36.7 C) Resp: 16 BP: 131/84 SpO2: 99 %  EBL: None  Complications: No immediate post-treatment complications observed by team, or reported by patient.  Note: The patient tolerated the entire procedure well. A repeat set of vitals were taken after the procedure and the patient was kept under observation following institutional policy, for this type of procedure. Post-procedural neurological assessment was performed, showing return to baseline, prior to discharge. The patient was provided with post-procedure discharge instructions, including a section on how to identify potential problems. Should any problems arise concerning this procedure, the patient was given instructions to immediately contact us, at any time, without hesitation. In any case, we plan to contact the patient by telephone for a follow-up status report regarding this interventional procedure.  Comments:  No additional relevant information.  Plan of Care  Orders:  Orders Placed This Encounter  Procedures  . [facility] C-ARM 1-60 MIN NO REPORT    Intraoperative interpretation by procedural physician at [facility].    Standing Status:   Standing    Number of Occurrences:   1    Order Specific Question:    Reason for exam:    Answer:   Assistance in needle guidance and placement for procedures requiring needle placement in or near specific anatomical locations not easily accessible without such assistance.   Medications ordered for procedure: Meds ordered this encounter  Medications  . lidocaine (XYLOCAINE) 2 % (with pres) injection 400 mg  . midazolam (VERSED) 5 MG/5ML injection 1-2 mg    Make sure Flumazenil is available in the pyxis when using this medication. If oversedation occurs, administer 0.2 mg IV over 15 sec. If after 45 sec no response, administer 0.2 mg again over 1 min; may repeat at 1 min intervals; not to exceed 4 doses (1 mg)  . fentaNYL (SUBLIMAZE) injection 25-50 mcg    Make sure Narcan is available in the pyxis when using this medication. In the event of respiratory depression (RR< 8/min): Titrate NARCAN (naloxone) in increments of 0.1 to 0.2 mg IV at 2-3 minute intervals, until desired degree of reversal.  . ropivacaine (PF) 2 mg/mL (0.2%) (NAROPIN) injection 1 mL  . dexamethasone (DECADRON) injection 10 mg   Medications administered: We administered lidocaine, fentaNYL, ropivacaine (PF) 2 mg/mL (0.2%), and dexamethasone.  See the medical record for exact dosing, route, and time of administration.  Follow-up plan:   Return for [facility] appt (change to in-person).      s/p L GN RFA on 7/20-helpful and TPI 7/20; status post left hip injection in greater trochanteric bursa injection with orthopedics [facility]) not helpful.  History of L5-S1 laminectomies, status post caudal ESI as well as cervical TPI on 05/28/2019.  PRN left knee genicular radiofrequency ablation.  Repeat on 07/18/2019. Right Genicular RFA on 08/13/2019, patient has completed psych for SCS trial will follow up in person in a couple of weeks to discuss      Recent Visits Date Type Provider Dept  07/18/19 Procedure visit Gillis Santa, MD [facility]  07/02/19 Procedure visit Gillis Santa, MD  [facility]  06/11/19 Procedure visit Gillis Santa, MD [facility]  06/07/19 Office Visit Gillis Santa, MD [facility]  05/28/19 Procedure visit Gillis Santa, MD  [facility]  Showing recent visits within past 90 days and meeting all other requirements   Today's Visits Date Type Provider Dept  08/13/19 Procedure visit Gillis Santa, MD [facility]  Showing today's visits and meeting all other requirements   Future Appointments Date Type Provider Dept  08/29/19 Appointment Gillis Santa, MD [facility]  Showing future appointments within next 90 days and meeting all other requirements   Disposition: Discharge home  Discharge Date & Time: 08/13/2019; 1010 hrs.   Primary Care Physician: Aletha Halim., PA-C Location: [facility] Note by: Gillis Santa, MD Date: 08/13/2019; Time: 11:52 AM  Disclaimer:  Medicine is not an exact science. The only guarantee in medicine is that nothing is guaranteed. It is important to note that the decision to proceed with this intervention was based on the information collected from the patient. The Data and conclusions were drawn from the patient's questionnaire, the interview, and the physical examination. Because the information was provided in large part by the patient, it cannot be guaranteed that it has not been purposely or unconsciously manipulated. Every effort has been made to obtain as much relevant data as possible for this evaluation. It is important to note that the conclusions that lead to this procedure are derived in large part from the available data. Always take into account that the treatment will also be dependent on availability of resources and existing treatment guidelines, considered by other Pain Management Practitioners as being common knowledge and practice, at the time of the intervention. For Medico-Legal purposes, it is also important to point  out that variation in procedural techniques and pharmacological choices are the acceptable norm. The indications, contraindications, technique, and results of the above procedure should only be interpreted and judged by a Board-Certified Interventional Pain Specialist with extensive familiarity and expertise in the same exact procedure and technique.

## 2019-08-13 NOTE — Progress Notes (Signed)
Procedure:          Anesthesia, Analgesia, Anxiolysis:  Type: Trigger Point Injection: Trapezius, cervical paraspinals, Periscapular, posterior patellar Primary Purpose: Therapeutic  Type: Local Anesthesia Indication(s): Analgesia         Local Anesthetic: Lidocaine 1-2% Route: Infiltration ([facility]uthers/IM) IV Access: Declined Sedation: Declined    Indications: Myofascial pain  Pain Score: Pre-procedure: 10-Worst pain ever/10 Post-procedure: 0-No pain/10   Pre-op Assessment:  Abigail Abigail [name] is a 53 y.o. (year old), female patient, seen today for interventional treatment. She  has a past surgical history that includes left foot bone spur surgery (10 yrs ago); Abdominal hysterectomy (14 yrs ago); Lumbar laminectomy/decompression microdiscectomy (07/13/2012); Lumbar laminectomy/decompression microdiscectomy (Right, 03/07/2013); Hemi-microdiscectomy lumbar laminectomy level 1 (N/A, 09/28/2013); Breast biopsy (Left, 04/10/2015); Cholecystectomy; Eye surgery; Back surgery; and Anterior cervical decompression/discectomy fusion 4 level (N/A, 10/18/2018). Abigail Abigail [name] has a current medication list which includes the following prescription(s): acetaminophen, amlodipine-olmesartan, atorvastatin, celecoxib, cholecalciferol, clonazepam, duloxetine, fluticasone, fremanezumab-vfrm, hydrocortisone, methocarbamol, metoprolol succinate, nortriptyline, prednisone, pregabalin, rizatriptan, tizanidine, alprazolam, and amitriptyline, and the following Facility-Administered Medications: fentanyl and midazolam. Her primarily concern today is the Knee Pain (right) and Neck Pain  Initial Vital Signs:  Pulse/HCG Rate: 97ECG Heart Rate: 79 Temp: 98.2 F (36.8 C) Resp: 18 BP: (!) 107/92(took BP med this am) SpO2: 100 %  BMI: Estimated body mass index is 27.46 kg/m as calculated from the following:   Height as of this encounter: 5\' 5"  (1.651 m).   Weight as of this encounter: 165 lb (74.8 kg).  Risk Assessment: Allergies:  Reviewed. She has No Known Allergies.  Allergy Precautions: None required Coagulopathies: Reviewed. None identified.  Blood-thinner therapy: None at this time Active Infection(s): Reviewed. None identified. Abigail Abigail [name] is afebrile  Site Confirmation: Abigail Abigail [name] was asked Abigail [name] confirm the procedure and laterality before marking the site Procedure checklist: Completed Consent: Before the procedure and under the influence of no sedative(s), amnesic(s), or anxiolytics, the patient was informed of the treatment options, risks and possible complications. Abigail [name] fulfill our ethical and legal obligations, as recommended by the American Medical Association's Code of Ethics, I have informed the patient of my clinical impression; the nature and purpose of the treatment or procedure; the risks, benefits, and possible complications of the intervention; the alternatives, including doing nothing; the risk(s) and benefit(s) of the alternative treatment(s) or procedure(s); and the risk(s) and benefit(s) of doing nothing. The patient was provided information about the general risks and possible complications associated with the procedure. These may include, but are not limited Abigail [name]: failure Abigail [name] achieve desired goals, infection, bleeding, organ or nerve damage, allergic reactions, paralysis, and death. In addition, the patient was informed of those risks and complications associated Abigail [name] the procedure, such as failure Abigail [name] decrease pain; infection; bleeding; organ or nerve damage with subsequent damage Abigail [name] sensory, motor, and/or autonomic systems, resulting in permanent pain, numbness, and/or weakness of one or several areas of the body; allergic reactions; (i.e.: anaphylactic reaction); and/or death. Furthermore, the patient was informed of those risks and complications associated with the medications. These include, but are not limited Abigail [name]: allergic reactions (i.e.: anaphylactic or anaphylactoid reaction(s)); adrenal axis  suppression; blood sugar elevation that in diabetics may result in ketoacidosis or comma; water retention that in patients with history of congestive heart failure may result in shortness of breath, pulmonary edema, and decompensation with resultant heart failure; weight gain; swelling or edema; medication-induced neural toxicity; particulate matter embolism and blood vessel occlusion with resultant organ, and/or nervous system infarction;  and/or aseptic necrosis of one or more joints. Finally, the patient was informed that Medicine is not an exact science; therefore, there is also the possibility of unforeseen or unpredictable risks and/or possible complications that may result in a catastrophic outcome. The patient indicated having understood very clearly. We have given the patient no guarantees and we have made no promises. Enough time was given Abigail [name] the patient Abigail [name] ask questions, all of which were answered Abigail [name] the patient's satisfaction. Abigail Abigail [name] has indicated that she wanted Abigail [name] continue with the procedure. Attestation: I, the ordering provider, attest that I have discussed with the patient the benefits, risks, side-effects, alternatives, likelihood of achieving goals, and potential problems during recovery for the procedure that I have provided informed consent. Date  Time: 08/13/2019  9:52 AM  Pre-Procedure Preparation:  Monitoring: As per clinic protocol. Respiration, ETCO2, SpO2, BP, heart rate and rhythm monitor placed and checked for adequate function Safety Precautions: Patient was assessed for positional comfort and pressure points before starting the procedure. Time-out: I initiated and conducted the "Time-out" before starting the procedure, as per protocol. The patient was asked Abigail [name] participate by confirming the accuracy of the "Time Out" information. Verification of the correct person, site, and procedure were performed and confirmed by me, the nursing staff, and the patient. "Time-out"  conducted as per Joint Commission's Universal Protocol (UP.01.01.01). Time: 0910  Description of Procedure:          Prepping solution: DuraPrep (Iodine Povacrylex [0.7% available iodine] and Isopropyl Alcohol, 74% w/w) Safety Precautions: Aspiration looking for blood return was conducted prior Abigail [name] all injections. At no point did we inject any substances, as a needle was being advanced. No attempts were made at seeking any paresthesias. Safe injection practices and needle disposal techniques used. Medications properly checked for expiration dates. SDV (single dose vial) medications used. Description of the Procedure: Protocol guidelines were followed. The patient was placed in position over the fluoroscopy table. The target area was identified and the area prepped in the usual manner. Skin & deeper tissues infiltrated with local anesthetic. Appropriate amount of time allowed Abigail [name] pass for local anesthetics Abigail [name] take effect. The procedure needles were then advanced Abigail [name] the target area. Proper needle placement secured. Negative aspiration confirmed. Solution injected in intermittent fashion, asking for systemic symptoms every 0.5cc of injectate. The needles were then removed and the area cleansed, making sure Abigail [name] leave some of the prepping solution back Abigail [name] take advantage of its long term bactericidal properties.  Vitals:   08/13/19 0944 08/13/19 0945 08/13/19 0955 08/13/19 1005  BP: 114/85 124/84 124/87 131/84  Pulse:      Resp: 18 16 17 16   Temp:    98 F (36.7 C)  TempSrc:    Temporal  SpO2: 98% 96% 98% 99%  Weight:      Height:        Start Time: 0910 hrs. End Time: 0945 hrs. Materials:  Needle(s) Type: Regular needle Gauge: 25G Length: 1.5-in Medication(s): Please see orders for medications and dosing details. Approximately 19 trigger points injected with 0.5 Abigail [name] 1 cc of 0.2% ropivacaine with dry needling performed at each trigger point.

## 2019-08-14 ENCOUNTER — Telehealth: Payer: Self-pay | Admitting: *Deleted

## 2019-08-14 NOTE — Telephone Encounter (Signed)
Spoke with patient re; procedure on yesterday, denies any questions or concerns.  

## 2019-08-15 ENCOUNTER — Encounter: Payer: Medicare Other | Attending: Psychology | Admitting: Psychology

## 2019-08-15 ENCOUNTER — Other Ambulatory Visit: Payer: Self-pay

## 2019-08-15 DIAGNOSIS — G8929 Other chronic pain: Secondary | ICD-10-CM | POA: Diagnosis not present

## 2019-08-15 DIAGNOSIS — M545 Low back pain: Secondary | ICD-10-CM | POA: Diagnosis not present

## 2019-08-15 DIAGNOSIS — E2740 Unspecified adrenocortical insufficiency: Secondary | ICD-10-CM | POA: Insufficient documentation

## 2019-08-15 DIAGNOSIS — M5412 Radiculopathy, cervical region: Secondary | ICD-10-CM | POA: Diagnosis present

## 2019-08-15 DIAGNOSIS — F418 Other specified anxiety disorders: Secondary | ICD-10-CM | POA: Diagnosis not present

## 2019-08-15 DIAGNOSIS — G894 Chronic pain syndrome: Secondary | ICD-10-CM | POA: Diagnosis not present

## 2019-08-15 DIAGNOSIS — Z79899 Other long term (current) drug therapy: Secondary | ICD-10-CM | POA: Diagnosis present

## 2019-08-15 DIAGNOSIS — Z981 Arthrodesis status: Secondary | ICD-10-CM | POA: Diagnosis not present

## 2019-08-15 DIAGNOSIS — F1190 Opioid use, unspecified, uncomplicated: Secondary | ICD-10-CM | POA: Insufficient documentation

## 2019-08-15 DIAGNOSIS — I10 Essential (primary) hypertension: Secondary | ICD-10-CM | POA: Insufficient documentation

## 2019-08-16 ENCOUNTER — Encounter: Payer: Self-pay | Admitting: Student in an Organized Health Care Education/Training Program

## 2019-08-16 ENCOUNTER — Telehealth: Payer: Self-pay

## 2019-08-16 NOTE — Telephone Encounter (Signed)
Patient is requesting an appointment next week to discuss the SCS.  Are their any appointments available?

## 2019-08-17 NOTE — Telephone Encounter (Signed)
Pamala Duffel, I checked every day on Dr. Elwyn Lade sched. He just doesn't have anything before her scheduled appt on 08-29-19

## 2019-08-24 ENCOUNTER — Encounter: Payer: Self-pay | Admitting: Psychology

## 2019-08-24 NOTE — Progress Notes (Signed)
Neuropsychological Evaluation   Patient:                           Abigail [name]    DOB:                               [id number]   MR Number:                  [id number]  Location:                        [facility] [place], [place] [id number] [place] [address] Dept: [phone]   Date of Service:                     08/15/2019   Start Time:                               1 PM End Time:                                2 PM   Provider/Observer:               Ilean Skill, Psy.D.                                                   Clinical Neuropsychologist  Today's visit included the patient completing the [place] multiphasic personality inventory-II as well as the pain patient profile.  These objective psychological/neuropsychological test measures were then scored and interpreted with formal report writing today.  Reason for Service:              Abigail [name] is a 53 year old female referred by Dr. Holley Raring for psychological evaluation as part of the standard work-up/protocol for consideration of spinal cord stimulator trialing and possible implantation.  The patient has a significant past medical history including adrenal cortex insufficiency, arthritis, complications from anesthesia, headache, hypertension, depression, anxiety and syncopal episodes.  The patient's complete medical history can be found in her electronic medical records.  The patient also has had 3 prior back surgeries as well as cervical surgeries in the past.  The patient had a lumbar laminectomy/decompression microdiscectomy in 2013.  Lumbar laminectomy/decompression microdiscectomy in 2014 and hemimicrodiscectomy lumbar laminectomy level 1 and 2014.  Other surgeries have included hysterectomy, breast biopsy, cholecystectomy, eye surgery.  Her most recent surgery was an anterior cervical  decompression/discectomy fusion for level.  The patient is continued with significant back pain particularly in her lower back.   The patient reports that she is continued to have constant pain/chronic pain which is related by the patient to be connected to her L5-S1 issues.  The patient describes pain going down her leg and at one point it was thought that she was having some hip issues but ultimately, they realized it was related to nerve root impingement.  The patient has tried different interventions beyond pharmacological treatments including shots in her back.  The patient also has significant knee pain that is being treated by Dr.  Lateef as well.   The patient reports that she has had issues with depression anxiety for years but is being maintained on benzodiazepine as well as SSRI medications for these issues and feel like they are well managed at this point.  The patient also describes issues with tremors, lightheadedness and arthritis.  The patient reports that there are no major psychosocial factors that are particularly problematic at this point.  The patient reports that her current (second marriage) marriage is going quite well and he is very good and they have a stable relationship.  The patient does report that her grandson passed away in the recent past and it is been particularly problematic for her daughter and the grandson's twin brother.  She reports that this is a current stressor for her but it is not overwhelming.   The patient reports that she does have difficulty with her sleep pattern and she associates this primarily with her chronic significant pain issues.  The patient acknowledges some issues with memory deficits but the degree of pain and use of benzodiazepines likely play a role in this.  The patient reports that these difficulties make it very hard for her to do much activities around the house and help with household issues  Test Results:                                      Initially, the patient completed the [place] multiphasic personality inventory, second edition (MMPI-II).  The resulting validity scales on the MMPI-2 suggest that the patient approach this measure in an honest and straightforward fashion neither trying to exaggerate or minimize any current symptomatology. While the patient does acknowledge significant pain symptoms and issues of depression and anxiety, these are correlated with her medical status as well as the significant change in life functioning she has experienced over the past extended period of time.   As far as specific clinical scales, the patient had elevations with regard to both vague and specific physical/health/somatic concerns that are consistent with objective issues she is dealing with medically.  There is also an elevation with regard to depressive features as well as indications of difficulty coping with and dealing with the impacts of this pain and disability on her ability to manage her thoughts, feelings, and emotions.  Looking at content/supplemental scales the patient does have significant elevations on many domains with regard to physical/medical difficulties.  However, these elevated scales are not indicative of a somatizations or somatoform disorder and are highly correlated with objective medical findings.  As far as the depressive symptoms the patient does exhibit/display symptoms consistent with feelings of helplessness and hopelessness as well as issues associated with the impact clinical depression can have on sleep, mood, and cognition. The patient did endorse items related to social isolation, feeling alienated, and PTSD type symptoms. Clinical scales shown to be sensitive to vulnerability toward substance abuse were also not elevated.    Due to the fact that the MMPI normative data does not have specific comparisons to people with known chronic pain symptoms, such as the patient, the patient was also administered the pain  patient profile (P-3).  This profile looks at specific variables that can be elevated on the MMPI and chronic pain patients that would be interpreted differently from individuals that have the same type of elevations without chronic pain symptoms.  This protocol has normative comparisons for both a community-based normative  sample without chronic pain as well as a pain patient profile without psychiatric or psychological features beyond their chronic pain condition.    The patient's score on the Somatization scale is above average for a pain patient. Her score suggests she is troubled by her physical problems, pain, and health related issues that are having a negative effect on her life. The patient's pain and suffering may occupy a distressed amount of her attention and concentration, likely causing her to be easily distracted in her daily life. Individuals with a clearly defined organic basis for pain often respond in this manner.   The patient's depression and anxiety indices were also elevated relative to community-based normative and chronic pain patient populations as they were on the MMPI-II. She is still coping with the loss of her grandson and dealing with recurrent/chronic severe back pain.   Summary of Results:                        The results of the objective psychological/psychiatric testing do show that the patient has some significant features relative to a normative and chronic pain population with regard to both vague and specific somatic/physical complaints as well as elevations with regard to depression and anxiety.  The patient does have times where her pain levels make it hard for her to maintain cognitive focus and likely impacts attention and concentration and other cognitive deficiencies.   There also no indication of it of any significant psychosocial features that would pose complications during the trialing phase.  The patient is married and reports having a supportive and stable  relationship.     Impression/Diagnosis:                    Overall, the results of the current psychological evaluation are somewhat consistent with patterns typically found with patients who are candidates for spinal cord stimulator trialing and possible implantation.  While the patient does have significant elevations with regard to depression and indices and would meet the criterion for major depressive disorder with anxiety features, there are no indications of somatization or somatoform disorder.  The patient has a generally stable current life, and she is not in a significant psychosocial to stressors for financial or living situation.  While the patient does not have extensive social or family support, she is managing well given the circumstances.  The only feature that will need to continue to be addressed during the trialing phase would have to do with her longstanding depressive and anxious features. She is currently being managed by her treating physicians with a combination of benzodiazepine as well as SSRI medication for these issues with relative success. I recommend that she also participate in psychological counseling during the time of her spinal cord stimulator trialing to address any change in cognition, mood, or behavior if this is deemed to be an appropriate medical option.    Diagnosis:                              Chronic pain syndrome   Chronic bilateral low back pain without sciatica   Depression with anxiety   Long term prescription benzodiazepine use   Opiate use   Cervical radiculopathy     Ilean Skill, Psy.D. Neuropsychologist

## 2019-08-26 ENCOUNTER — Encounter: Payer: Self-pay | Admitting: Student in an Organized Health Care Education/Training Program

## 2019-08-29 ENCOUNTER — Ambulatory Visit
Payer: Medicare Other | Attending: Student in an Organized Health Care Education/Training Program | Admitting: Student in an Organized Health Care Education/Training Program

## 2019-08-29 ENCOUNTER — Encounter: Payer: Self-pay | Admitting: Student in an Organized Health Care Education/Training Program

## 2019-08-29 ENCOUNTER — Other Ambulatory Visit: Payer: Self-pay

## 2019-08-29 VITALS — BP 113/80 | HR 99 | Temp 98.6°F | Resp 20 | Ht 65.0 in | Wt 167.0 lb

## 2019-08-29 DIAGNOSIS — M961 Postlaminectomy syndrome, not elsewhere classified: Secondary | ICD-10-CM | POA: Diagnosis not present

## 2019-08-29 DIAGNOSIS — M7918 Myalgia, other site: Secondary | ICD-10-CM

## 2019-08-29 DIAGNOSIS — M5416 Radiculopathy, lumbar region: Secondary | ICD-10-CM | POA: Diagnosis not present

## 2019-08-29 DIAGNOSIS — M1712 Unilateral primary osteoarthritis, left knee: Secondary | ICD-10-CM | POA: Insufficient documentation

## 2019-08-29 NOTE — Patient Instructions (Signed)
Trigger Point Injection Trigger points are areas where you have pain. A trigger point injection is a shot given in the trigger point to help relieve pain for a few days to a few months. Common places for trigger points include:  The neck.  The shoulders.  The upper back.  The lower back. A trigger point injection will not cure long-term (chronic) pain permanently. These injections do not always work for every person. For some people, they can help to relieve pain for a few days to a few months. Tell a health care provider about:  Any allergies you have.  All medicines you are taking, including vitamins, herbs, eye drops, creams, and over-the-counter medicines.  Any problems you or family members have had with anesthetic medicines.  Any blood disorders you have.  Any surgeries you have had.  Any medical conditions you have. What are the risks? Generally, this is a safe procedure. However, problems may occur, including:  Infection.  Bleeding or bruising.  Allergic reaction to the injected medicine.  Irritation of the skin around the injection site. What happens before the procedure? Ask your health care provider about:  Changing or stopping your regular medicines. This is especially important if you are taking diabetes medicines or blood thinners.  Taking medicines such as aspirin and ibuprofen. These medicines can thin your blood. Do not take these medicines unless your health care provider tells you to take them.  Taking over-the-counter medicines, vitamins, herbs, and supplements. What happens during the procedure?   Your health care provider will feel for trigger points. A marker may be used to circle the area for the injection.  The skin over the trigger point will be washed with a germ-killing (antiseptic) solution.  A thin needle is used for the injection. You may feel pain or a twitching feeling when the needle enters the trigger point.  A numbing solution may  be injected into the trigger point. Sometimes a medicine to keep down inflammation is also injected.  Your health care provider may move the needle around the area where the trigger point is located until the tightness and twitching goes away.  After the injection, your health care provider may put gentle pressure over the injection site.  The injection site will be covered with a bandage (dressing). The procedure may vary among health care providers and hospitals. What can I expect after treatment? After treatment, you may have:  Soreness and stiffness for 1-2 days.  A dressing. This can be taken off in a few hours or as told by your health care provider. Follow these instructions at home: Injection site care  Remove your dressing as told by your health care provider.  Check your injection site every day for signs of infection. Check for: ? Redness, swelling, or pain. ? Fluid or blood. ? Warmth. ? Pus or a bad smell. Managing pain, stiffness, and swelling  If directed, put ice on the affected area. ? Put ice in a plastic bag. ? Place a towel between your skin and the bag. ? Leave the ice on for 20 minutes, 2-3 times a day. General instructions  If you were asked to stop your regular medicines, ask your health care provider when you may start taking them again.  Return to your normal activities as told by your health care provider. Ask your health care provider what activities are safe for you.  Do not take baths, swim, or use a hot tub until your health care provider approves.    You may be asked to see an occupational or physical therapist for exercises that reduce muscle strain and stretch the area of the trigger point.  Keep all follow-up visits as told by your health care provider. This is important. Contact a health care provider if:  Your pain comes back, and it is worse than before the injection. You may need more injections.  You have chills or a fever.  The  injection site becomes more painful, red, swollen, or warm to the touch. Summary  A trigger point injection is a shot given in the trigger point to help relieve pain for a few days to a few months.  Common places for trigger point injections are the neck, shoulder, upper back, and lower back.  These injections do not always work for every person, but for some people, the injections can help to relieve pain for a few days to a few months.  Contact a health care provider if symptoms come back or they are worse than before treatment. Also, get help if the injection site becomes more painful, red, swollen, or warm to the touch. This information is not intended to replace advice given to you by your health care provider. Make sure you discuss any questions you have with your health care provider. Document Released: 09/16/2011 Document Revised: 11/08/2018 Document Reviewed: 11/08/2018 [employer] Patient Education  2020 [employer]  

## 2019-08-29 NOTE — Progress Notes (Signed)
Safety precautions to be maintained throughout the outpatient stay will include: orient to surroundings, keep bed in low position, maintain call bell within reach at all times, provide assistance with transfer out of bed and ambulation.  

## 2019-08-29 NOTE — Progress Notes (Signed)
Patient's Name: Abigail [name]  MRN: [id number]  Referring Provider: Aletha [name]., PA-C  DOB: 09-Sep-1966  PCP: Abigail [name]., PA-C  DOS: 08/29/2019  Note by: Abigail Santa, MD  Service setting: Ambulatory outpatient  Attending: Gillis Santa, MD  Location: [facility] (AMB) Pain Management Facility  Specialty: Interventional Pain Management  Patient type: Established   Primary Reason(s) for Visit: Evaluation of chronic illnesses with exacerbation, or progression (Level of risk: moderate) CC: Hip Pain (bilateral) and Leg Pain (bilateral)  HPI  Abigail [name] is a 53 y.o. year old, female patient, who comes today for a follow-up evaluation. She has Intervertebral disc disorder with myelopathy, lumbar region; Spinal stenosis, lumbar; Spinal stenosis, lumbar region, with neurogenic claudication; Lumbar radiculopathy; Recurrent herniation of lumbar disc; Herniated lumbar intervertebral disc; TIA (transient ischemic attack); Hypertension; Decreased sensation of foot; Cervical radiculopathy; Adrenal insufficiency ([place]); Depression; Benign essential hypertension; Carpal tunnel syndrome, bilateral upper limbs; Chronic bilateral low back pain without sciatica; Chronic daily headache; Enlarged pituitary gland ([place]); Headache disorder; Insomnia; Lightheadedness; Mixed incontinence; Neck pain, chronic; Nicotine use disorder; Osteoarthritis of multiple joints; Pain of left hip joint; Pure hypercholesterolemia; Sleeping difficulty; Snoring; Weakness; Chronic pain of right knee; Chronic pain syndrome; Long term prescription benzodiazepine use; Opiate use; Pharmacologic therapy; Disorder of skeletal system; Problems influencing health status; Marijuana use; Primary osteoarthritis of right knee; Postlaminectomy syndrome, lumbar (Bilateral L5/S1 [place]); Myofascial pain; and Primary osteoarthritis of left knee on their problem list. Abigail [name] was last seen on 08/13/2019. Her primarily concern today is the Hip Pain  (bilateral) and Leg Pain (bilateral)  Pain Assessment: Location: Right, Left Hip Radiating: legs Onset: More than a month ago Duration: Chronic pain Quality: Aching, Burning, Constant, Throbbing Severity: 10-Worst pain ever/10 (subjective, self-reported pain score)  Note: Reported level is inconsistent with clinical observations.                         When using our objective Pain Scale, levels between 6 and 10/10 are said to belong in an emergency room, as it progressively worsens from a 6/10, described as severely limiting, requiring emergency care not usually available at an outpatient pain management facility. At a 6/10 level, communication becomes difficult and requires great effort. Assistance to reach the emergency department may be required. Facial flushing and profuse sweating along with potentially dangerous increases in heart rate and blood pressure will be evident. Effect on ADL:   Timing: Constant Modifying factors: "nothing" BP: 113/80  HR: 99  History of L5-S1 bilateral laminectomies, chronic lumbar radicular pain.  Status post caudal ESI which was not very effective.  Working up for spinal cord stimulation.  Lumbar MRI completed 12/10/2018 patient has been evaluated by psychology.  See evaluation note by Abigail [name].  Risks and benefits of spinal cord stimulator trial reviewed in great detail with patient.  Patient would like to proceed.  Patient is status post right and left genicular nerve radiofrequency ablation/neurotomy.  Is endorsing pain relief and improvement with ADLs in regards to her right knee but continues to have persistent left knee pain.  Patient states that she would like to go to orthopedics.  Referral placed.  Plan for cervical/trapezius trigger point injections and dry needling for cervical myofascial pain syndrome.  History of C2-C5 ACDF.   UDS:  Summary  Date Value Ref Range Status  12/26/2018 FINAL  Final    Comment:     ==================================================================== [facility] COMP DRUG ANALYSIS,UR ==================================================================== Specimen Alert Note:  Urinary creatinine is low; ability to detect some drugs may be compromised.  Interpret results with caution. ==================================================================== Test                             Result       Flag       Units Drug Present and Declared for Prescription Verification   Alprazolam                     182          EXPECTED   ng/mg creat    Source of alprazolam is a scheduled prescription medication.   Pregabalin                     PRESENT      EXPECTED   Amitriptyline                  PRESENT      EXPECTED   Nortriptyline                  PRESENT      EXPECTED    Nortriptyline is an expected metabolite of amitriptyline.   Duloxetine                     PRESENT      EXPECTED   Metoprolol                     PRESENT      EXPECTED Drug Present not Declared for Prescription Verification   Carboxy-THC                    2706         UNEXPECTED ng/mg creat    Carboxy-THC is a metabolite of tetrahydrocannabinol  (THC).    Source of [facility] is most commonly illicit, but THC is also present    in a scheduled prescription medication.   Tramadol                       2976         UNEXPECTED ng/mg creat   O-Desmethyltramadol            1482         UNEXPECTED ng/mg creat   N-Desmethyltramadol            3200         UNEXPECTED ng/mg creat    Source of tramadol is a prescription medication.    O-desmethyltramadol and N-desmethyltramadol are expected    metabolites of tramadol. Drug Absent but Declared for Prescription Verification   Tizanidine                     Not Detected UNEXPECTED    Tizanidine, as indicated in the declared medication list, is not    always detected even when used as directed.   Methocarbamol                  Not Detected UNEXPECTED   Acetaminophen                   Not Detected UNEXPECTED    Acetaminophen, as indicated in the declared medication list, is    not always detected even when used as directed. ==================================================================== Test  Result    Flag   Units      Ref Range   Creatinine              17        L      mg/dL      >=20 ==================================================================== Declared Medications:  The flagging and interpretation on this report are based on the  following declared medications.  Unexpected results may arise from  inaccuracies in the declared medications.  **Note: The testing scope of this panel includes these medications:  Alprazolam  Amitriptyline  Duloxetine  Methocarbamol  Metoprolol  Pregabalin  **Note: The testing scope of this panel does not include small to  moderate amounts of these reported medications:  Acetaminophen  Tizanidine  **Note: The testing scope of this panel does not include following  reported medications:  Amlodipine (Azor)  Atorvastatin  Celecoxib  Cholecalciferol  Fluticasone  Fremanezumab  Hydrocortisone  Olmesartan (Azor) ==================================================================== For clinical consultation, please call [phone]. ====================================================================    Laboratory Chemistry Profile   Screening Lab Results  Component Value Date   SARSCOV2NAA NEGATIVE 04/25/2019   COVIDSOURCE NASOPHARYNGEAL 03/16/2019   STAPHAUREUS NEGATIVE 10/06/2018   MRSAPCR NEGATIVE 10/06/2018    Inflammation (CRP: Acute Phase) (ESR: Chronic Phase) Lab Results  Component Value Date   CRP 1 12/26/2018   ESRSEDRATE 11 12/26/2018                         Rheumatology Lab Results  Component Value Date   RF <10 08/29/2013   ANA NEG 08/29/2013                        Renal Lab Results  Component Value Date   BUN 12 12/26/2018   CREATININE 0.87 12/26/2018    BCR 14 12/26/2018   GFRAA 90 12/26/2018   GFRNONAA 78 12/26/2018                             Hepatic Lab Results  Component Value Date   AST 13 12/26/2018   ALT 7 06/09/2014   ALBUMIN 4.4 12/26/2018   ALKPHOS 59 12/26/2018   LIPASE 91 03/21/2013                        Electrolytes Lab Results  Component Value Date   NA 139 12/26/2018   K 5.0 12/26/2018   CL 99 12/26/2018   CALCIUM 9.3 12/26/2018   MG 2.1 12/26/2018                        Neuropathy Lab Results  Component Value Date   VITAMINB12 393 12/26/2018                        CNS No results found for: COLORCSF, APPEARCSF, RBCCOUNTCSF, WBCCSF, POLYSCSF, LYMPHSCSF, EOSCSF, PROTEINCSF, GLUCCSF, JCVIRUS, CSFOLI, IGGCSF, LABACHR, ACETBL                      Bone Lab Results  Component Value Date   25OHVITD1 48 12/26/2018   25OHVITD2 2.7 12/26/2018   25OHVITD3 45 12/26/2018                         Coagulation Lab Results  Component Value Date   INR 0.96  10/06/2018   LABPROT 12.7 10/06/2018   APTT 35 10/06/2018   PLT 291 10/06/2018                        Cardiovascular Lab Results  Component Value Date   CKTOTAL 102 03/21/2013   TROPONINI <0.03 02/01/2015   HGB 14.7 10/06/2018   HCT 45.6 10/06/2018                         ID Lab Results  Component Value Date   SARSCOV2NAA NEGATIVE 04/25/2019   STAPHAUREUS NEGATIVE 10/06/2018   MRSAPCR NEGATIVE 10/06/2018   MICROTEXT  03/23/2013       COMMENT                   NEGATIVE-CLOS.DIFFICILE TOXIN NOT DETECTED BY PCR   ANTIBIOTIC                                                        Cancer No results found for: CEA, CA125, LABCA2                      Endocrine Lab Results  Component Value Date   TSH 2.317 09/08/2017                        Note: Lab results reviewed.  Imaging Review  Cervical Imaging: Cervical MR wo contrast:  Results for orders placed during the hospital encounter of 01/24/15  MR Cervical Spine Wo Contrast   Narrative  CLINICAL DATA:  53 year old female with widespread pain with tingling and numbness. Right greater than left cervical neck pain. No known injury. Initial encounter.  EXAM: MRI CERVICAL SPINE WITHOUT CONTRAST  TECHNIQUE: Multiplanar, multisequence MR imaging of the cervical spine was performed. No intravenous contrast was administered.  COMPARISON:  Cervical spine radiographs 06/06/2014. Brain MRI 06/03/2014. [facility] 08/04/2013. Cervical spine MRI  FINDINGS: Chronic straightening of cervical lordosis. Chronic mild anterolisthesis of C2 on C3 is stable since 2014. Mild anterolisthesis of C3 on C4, and also trace anterolisthesis of C4 on C5 and C6 on C7 also appears stable. At the same time however there is mild increased STIR signal in the chronically degenerated right C3-C4 facets (series 4, image 3) and associated fluid in that facet joint. This appears to be degenerative in nature. See additional details below.  No other acute osseous abnormality. Cervicomedullary junction is within normal limits. Spinal cord signal is within normal limits at all visualized levels.  Negative paraspinal soft tissues.  C2-C3: Chronic moderate facet hypertrophy on the right. No significant stenosis.  C3-C4: Chronic anterolisthesis with right eccentric disc bulge. A chronic moderate facet hypertrophy greater on the right. New right facet joint fluid. No significant spinal stenosis. Mild right C4 foraminal stenosis has not significantly changed.  C4-C5: Chronic right eccentric disc bulge and up to moderate facet hypertrophy. Mild uncovertebral hypertrophy on the right. No significant stenosis.  C5-C6: Minimal disc bulge and left uncovertebral hypertrophy. No stenosis.  C6-C7: Mild to moderate facet hypertrophy greater on the right. No stenosis.  C7-T1:  Mild facet hypertrophy.  No stenosis.  No upper thoracic spinal or foraminal stenosis.  IMPRESSION: 1.  Dominant finding in the cervical spine is  multilevel chronic mild spondylolisthesis with associated facet arthropathy at each affected level. 2. Mild inflammation of the chronically degenerated right C3-C4 facet is most compatible with an acute exacerbation of the chronic facet joint arthritis. 3. Comparatively mild cervical disc degeneration and no cervical spinal stenosis.   Electronically Signed   By: Genevie Ann M.D.   On: 01/24/2015 22:25     Results for orders placed during the hospital encounter of 10/18/18  DG Cervical Spine 2 or 3 views   Narrative CLINICAL DATA:  Cervical spine fusion.  EXAM: CERVICAL SPINE - 2-3 VIEW  COMPARISON:  10/18/2018.  FINDINGS: C2 through C5 anterior interbody fusion. Hardware intact. Anatomic alignment. No acute bony abnormality. Diffuse degenerative change. Biapical pleural thickening consistent scarring.  IMPRESSION: Through C5 anterior interbody fusion.  Hardware intact.   Electronically Signed   By: Marcello Moores  Register   On: 10/19/2018 07:27    Results for orders placed during the hospital encounter of 06/06/14  DG Cervical Spine Complete   Narrative CLINICAL DATA:  Right-sided neck pain radiating to the shoulder arm for several weeks. Syncope with fall tonight. Increasing neck pain.  EXAM: CERVICAL SPINE  4+ VIEWS  COMPARISON:  MRI cervical spine 08/04/2013  FINDINGS: Slight anterior subluxation of C3 on C4 was present on previous MRI. Otherwise normal alignment of the cervical spine and facet joints. Degenerative changes throughout the facet joints. C1-2 articulation appears intact. Intervertebral disc space heights are preserved. No vertebral compression deformities. No prevertebral soft tissue swelling. No focal bone lesion or bone destruction.  IMPRESSION: Slight anterior subluxation of C3 on C4 has been present on previous studies and is likely degenerative. Degenerative changes throughout the facet joints. No acute  changes demonstrated.   Electronically Signed   By: Lucienne Capers M.D.   On: 06/06/2014 23:51     Lumbosacral Imaging: Lumbar MR wo contrast:  Results for orders placed during the hospital encounter of 12/10/18  MR LUMBAR SPINE WO CONTRAST   Narrative CLINICAL DATA:  Progressively worsening chronic low back pain. Prior lumbar surgeries.  EXAM: MRI LUMBAR SPINE WITHOUT CONTRAST  TECHNIQUE: Multiplanar, multisequence MR imaging of the lumbar spine was performed. No intravenous contrast was administered.  COMPARISON:  09/24/2013  FINDINGS: Segmentation:  Standard.  Alignment:  Unchanged grade 1 retrolisthesis of L5 on S1.  Vertebrae: No fracture, suspicious osseous lesion, or significant marrow edema. Progressive chronic degenerative marrow changes at L5-S1. Small superior endplate Schmorl's nodes at T12, L1, and L3, unchanged. Small Tarlov cysts at S2 and S3.  Conus medullaris and cauda equina: Conus extends to the L1-2 level. Conus and cauda equina appear normal.  Paraspinal and other soft tissues: Unchanged 1 cm T2 hyperintense lesion in the left kidney, likely a cyst. Postoperative changes in the posterior lower lumbar soft tissues. No fluid collection.  Disc levels:  Disc desiccation throughout most of the lumbar and lower thoracic spine, greatest at L4-5 and L5-S1 where there is mild and severe disc space narrowing, respectively.  T12-L1 through L3-4: Negative.  L4-5: Mild disc bulging and mild facet and ligamentum flavum hypertrophy, at most slightly progressed from the prior study. No significant stenosis.  L5-S1: Bilateral laminectomies, new on the left since the prior study. Epidural fibrosis is most notable about the right S1 nerve root. No recurrent disc herniation or spinal or lateral recess stenosis is identified. Disc bulging, endplate spurring, severe disc space height loss, and moderate facet hypertrophy result in moderate bilateral neural  foraminal stenosis, not significantly changed.  IMPRESSION: 1. Postsurgical changes and progressive disc degeneration at L5-S1 without evidence of recurrent disc herniation or spinal stenosis. Unchanged moderate bilateral neural foraminal stenosis. 2. Mild disc bulging and posterior element hypertrophy at L4-5 without stenosis.   Electronically Signed   By: Logan Bores M.D.   On: 12/10/2018 13:45    Results for orders placed during the hospital encounter of 09/27/13  DG Lumbar Spine 2-3 Views   Narrative CLINICAL DATA:  Lumbar disc protrusion.  EXAM: LUMBAR SPINE - 2-3 VIEW  COMPARISON:  MRI dated 09/24/2013  FINDINGS: There is marked narrowing of the L5-S1 disc space. The remainder of the lumbar spine is normal.  IMPRESSION: Degenerative disc disease at L5-S1. Vertebra are labeled for intraoperative comparison.   Electronically Signed   By: Rozetta Nunnery M.D.   On: 09/27/2013 14:14    Hip-R MR wo contrast:  Results for orders placed during the hospital encounter of 06/20/18  MR HIP RIGHT WO CONTRAST   Narrative CLINICAL DATA:  Chronic bilateral hip pain.  EXAM: MR OF THE LEFT HIP WITHOUT CONTRAST  MR OF THE RIGHT HIP WITHOUT CONTRAST  TECHNIQUE: Multiplanar, multisequence MR imaging was performed. No intravenous contrast was administered.  COMPARISON:  CT abdomen pelvis dated March 21, 2013.  FINDINGS: Bones: There is no evidence of acute fracture, dislocation or avascular necrosis. The visualized bony pelvis appears normal. The visualized sacroiliac joints and symphysis pubis appear normal. Degenerative disc disease at L5-S1.  Articular cartilage and labrum  Articular cartilage: No focal chondral defect or subchondral signal abnormality identified.  Labrum: Partial tear of the left anterior superior labrum. The right labrum is intact.  Joint or bursal effusion  Joint effusion: No significant hip joint effusion.  Bursae: No focal periarticular  fluid collection.  Muscles and tendons  Muscles and tendons: The visualized gluteus, hamstring and iliopsoas tendons appear normal. No muscle edema or atrophy.  Other findings  Miscellaneous: Prior hysterectomy. The visualized internal pelvic contents otherwise appear unremarkable.  IMPRESSION: 1. Partial tear of the left anterior superior labrum. 2. No acute osseous abnormality or significant degenerative changes.   Electronically Signed   By: Titus Dubin M.D.   On: 06/20/2018 16:25    Hip-L MR wo contrast:  Results for orders placed during the hospital encounter of 06/20/18  MR HIP LEFT WO CONTRAST   Narrative CLINICAL DATA:  Chronic bilateral hip pain.  EXAM: MR OF THE LEFT HIP WITHOUT CONTRAST  MR OF THE RIGHT HIP WITHOUT CONTRAST  TECHNIQUE: Multiplanar, multisequence MR imaging was performed. No intravenous contrast was administered.  COMPARISON:  CT abdomen pelvis dated March 21, 2013.  FINDINGS: Bones: There is no evidence of acute fracture, dislocation or avascular necrosis. The visualized bony pelvis appears normal. The visualized sacroiliac joints and symphysis pubis appear normal. Degenerative disc disease at L5-S1.  Articular cartilage and labrum  Articular cartilage: No focal chondral defect or subchondral signal abnormality identified.  Labrum: Partial tear of the left anterior superior labrum. The right labrum is intact.  Joint or bursal effusion  Joint effusion: No significant hip joint effusion.  Bursae: No focal periarticular fluid collection.  Muscles and tendons  Muscles and tendons: The visualized gluteus, hamstring and iliopsoas tendons appear normal. No muscle edema or atrophy.  Other findings  Miscellaneous: Prior hysterectomy. The visualized internal pelvic contents otherwise appear unremarkable.  IMPRESSION: 1. Partial tear of the left anterior superior labrum. 2. No acute osseous abnormality or significant  degenerative changes.   Electronically Signed  By: Titus Dubin M.D.   On: 06/20/2018 16:25    Knee-L MR w contrast:  Results for orders placed during the hospital encounter of 12/07/18  MR KNEE LEFT WO CONTRAST   Narrative CLINICAL DATA:  Diffuse left knee pain and locking for 1 year.  EXAM: MRI OF THE LEFT KNEE WITHOUT CONTRAST  TECHNIQUE: Multiplanar, multisequence MR imaging of the knee was performed. No intravenous contrast was administered.  COMPARISON:  None.  FINDINGS: MENISCI  Medial meniscus:  Intact.  Lateral meniscus:  Intact.  LIGAMENTS  Cruciates:  Intact.  Collaterals:  Intact.  CARTILAGE  Patellofemoral: Fissuring and irregularity are seen about both the medial and lateral patellar facets, worst in the upper pole of the medial facet and mid to lower pole of the lateral facet.  Medial:  Normal.  Lateral:  Normal.  Joint:  Trace amount of joint fluid.  Popliteal Fossa:  No Baker's cyst.  Extensor Mechanism:  Intact.  Bones: Scattered small subchondral cysts and mild edema are seen in the patella at sites of cartilage loss.  Other: None.  IMPRESSION: Negative for meniscal or ligament tear.  Moderate appearing patellofemoral osteoarthritis.   Electronically Signed   By: Inge Rise M.D.   On: 12/07/2018 15:15     Complexity Note: Imaging results reviewed. Results shared with Ms. Aline Brochure, using Layman's terms.                         Meds   Current Outpatient Medications:  .  acetaminophen (TYLENOL) 500 MG tablet, Take 500 mg by mouth every 6 (six) hours as needed., Disp: , Rfl:  .  amLODipine-olmesartan (AZOR) 5-20 MG per tablet, Take 1 tablet by mouth daily before breakfast., Disp: , Rfl:  .  atorvastatin (LIPITOR) 20 MG tablet, Take 20 mg by mouth daily., Disp: , Rfl:  .  celecoxib (CELEBREX) 200 MG capsule, Take 200 mg by mouth 2 (two) times daily., Disp: , Rfl:  .  Cholecalciferol (VITAMIN D-1000 MAX ST) 25 MCG  (1000 UT) tablet, Take 1,000 Units by mouth daily., Disp: , Rfl:  .  clonazePAM (KLONOPIN) 0.5 MG tablet, TAKE 1/2 TABLET BY MOUTH 2 3 TIMES PER DAY AS NEEDED, Disp: , Rfl:  .  DULoxetine (CYMBALTA) 60 MG capsule, Take 60 mg by mouth 2 (two) times daily., Disp: , Rfl:  .  fluticasone (FLONASE) 50 MCG/ACT nasal spray, Place 2 sprays into both nostrils 2 (two) times daily., Disp: , Rfl:  .  Fremanezumab-vfrm (AJOVY) 225 MG/1.5ML SOSY, Inject 225 mg into the skin every 28 (twenty-eight) days., Disp: , Rfl:  .  hydrocortisone (CORTEF) 5 MG tablet, Take 5-10 mg by mouth See admin instructions. Take 63m by mouth in the morning and 587min the evening, Disp: , Rfl:  .  methocarbamol (ROBAXIN) 500 MG tablet, Take 2 tablets (1,000 mg total) by mouth 4 (four) times daily., Disp: 90 tablet, Rfl: 0 .  metoprolol succinate (TOPROL-XL) 50 MG 24 hr tablet, Take 50 mg by mouth daily. Take with or immediately following a meal., Disp: , Rfl:  .  nortriptyline (PAMELOR) 50 MG capsule, TAKE 2 CAPSULES (100 MG TOTAL) BY MOUTH NIGHTLY, Disp: , Rfl:  .  predniSONE (DELTASONE) 5 MG tablet, Take 5 mg by mouth daily., Disp: , Rfl:  .  pregabalin (LYRICA) 75 MG capsule, Take 75 mg by mouth 3 (three) times daily., Disp: , Rfl:  .  rizatriptan (MAXALT-MLT) 10 MG disintegrating tablet, Take 10  mg by mouth as needed. Taking for headaches, Disp: , Rfl:  .  tiZANidine (ZANAFLEX) 2 MG tablet, Take by mouth 3 (three) times daily., Disp: , Rfl:   ROS  Constitutional: Denies any fever or chills Gastrointestinal: No reported hemesis, hematochezia, vomiting, or acute GI distress Musculoskeletal: Denies any acute onset joint swelling, redness, loss of ROM, or weakness Neurological: No reported episodes of acute onset apraxia, aphasia, dysarthria, agnosia, amnesia, paralysis, loss of coordination, or loss of consciousness  Allergies  Ms. [place] has No Known Allergies.  [place]  Drug: Ms. Shere  reports current drug use. Frequency:  7.00 times per week. Drug: Marijuana. Alcohol:  reports current alcohol use. Tobacco:  reports that she has been smoking cigarettes. She has a 10.50 pack-year smoking history. She has never used smokeless tobacco. Medical:  has a past medical history of Adrenal cortex insufficiency ([place]), Arthritis, Complication of anesthesia, Headache(784.0), Hypertension, and Syncope. Surgical: Ms. Neumeyer  has a past surgical history that includes left foot bone spur surgery (10 yrs ago); Abdominal hysterectomy (14 yrs ago); Lumbar laminectomy/decompression microdiscectomy (07/13/2012); Lumbar laminectomy/decompression microdiscectomy (Right, 03/07/2013); Hemi-microdiscectomy lumbar laminectomy level 1 (N/A, 09/28/2013); Breast biopsy (Left, 04/10/2015); Cholecystectomy; Eye surgery; Back surgery; and Anterior cervical decompression/discectomy fusion 4 level (N/A, 10/18/2018). Family: family history is not on file.  Constitutional Exam  General appearance: alert, cooperative and in mild distress Vitals:   08/29/19 1356  BP: 113/80  Pulse: 99  Resp: 20  Temp: 98.6 F (37 C)  TempSrc: Oral  SpO2: 97%  Weight: 167 lb (75.8 kg)  Height: 5' 5"  (1.651 m)   BMI Assessment: Estimated body mass index is 27.79 kg/m as calculated from the following:   Height as of this encounter: 5' 5"  (1.651 m).   Weight as of this encounter: 167 lb (75.8 kg).  BMI interpretation table: BMI level Category Range association with higher incidence of chronic pain  <18 kg/m2 Underweight   18.5-24.9 kg/m2 Ideal body weight   25-29.9 kg/m2 Overweight Increased incidence by 20%  30-34.9 kg/m2 Obese (Class I) Increased incidence by 68%  35-39.9 kg/m2 Severe obesity (Class II) Increased incidence by 136%  >40 kg/m2 Extreme obesity (Class III) Increased incidence by 254%   Patient's current BMI Ideal Body weight  Body mass index is 27.79 kg/m. Ideal body weight: 57 kg (125 lb 10.6 oz) Adjusted ideal body weight: 64.5 kg (142 lb 3.2  oz)   BMI Readings from Last 4 Encounters:  08/29/19 27.79 kg/m  08/13/19 27.46 kg/m  07/18/19 27.79 kg/m  07/02/19 27.79 kg/m   Wt Readings from Last 4 Encounters:  08/29/19 167 lb (75.8 kg)  08/13/19 165 lb (74.8 kg)  07/18/19 167 lb (75.8 kg)  07/02/19 167 lb (75.8 kg)  Psych/Mental status: Alert, oriented x 3 (person, place, & time)       Eyes: PERLA Respiratory: No evidence of acute respiratory distress  Cervical Spine Area Exam  Skin & Axial Inspection: Well healed scar from previous spine surgery detected Alignment: Symmetrical Functional ROM: Pain restricted ROM      Stability: No instability detected Muscle Tone/Strength: Functionally intact. No obvious neuro-muscular anomalies detected. Sensory (Neurological): Musculoskeletal pain pattern Palpation: Complains of area being tender to palpation              Upper Extremity (UE) Exam    Side: Right upper extremity  Side: Left upper extremity  Skin & Extremity Inspection: Skin color, temperature, and hair growth are WNL. No peripheral edema or cyanosis. No masses,  redness, swelling, asymmetry, or associated skin lesions. No contractures.  Skin & Extremity Inspection: Skin color, temperature, and hair growth are WNL. No peripheral edema or cyanosis. No masses, redness, swelling, asymmetry, or associated skin lesions. No contractures.  Functional ROM: Unrestricted ROM          Functional ROM: Unrestricted ROM          Muscle Tone/Strength: Functionally intact. No obvious neuro-muscular anomalies detected.  Muscle Tone/Strength: Functionally intact. No obvious neuro-muscular anomalies detected.  Sensory (Neurological): Unimpaired          Sensory (Neurological): Unimpaired          Palpation: No palpable anomalies              Palpation: No palpable anomalies              Provocative Test(s):  Phalen's test: deferred Tinel's test: deferred Apley's scratch test (touch opposite shoulder):  Action 1 (Across chest):  deferred Action 2 (Overhead): deferred Action 3 (LB reach): deferred   Provocative Test(s):  Phalen's test: deferred Tinel's test: deferred Apley's scratch test (touch opposite shoulder):  Action 1 (Across chest): deferred Action 2 (Overhead): deferred Action 3 (LB reach): deferred    Thoracic Spine Area Exam  Skin & Axial Inspection: No masses, redness, or swelling Alignment: Symmetrical Functional ROM: Unrestricted ROM Stability: No instability detected Muscle Tone/Strength: Functionally intact. No obvious neuro-muscular anomalies detected. Sensory (Neurological): Unimpaired Muscle strength & Tone: No palpable anomalies  Lumbar Spine Area Exam  Skin & Axial Inspection: Well healed scar from previous spine surgery detected Alignment: Symmetrical Functional ROM: Pain restricted ROM affecting both sides Stability: No instability detected Muscle Tone/Strength: Functionally intact. No obvious neuro-muscular anomalies detected. Sensory (Neurological): Dermatomal pain pattern   Gait & Posture Assessment  Ambulation: Unassisted Gait: Relatively normal for age and body habitus Posture: WNL   Lower Extremity Exam    Side: Right lower extremity  Side: Left lower extremity  Stability: No instability observed          Stability: No instability observed          Skin & Extremity Inspection: Skin color, temperature, and hair growth are WNL. No peripheral edema or cyanosis. No masses, redness, swelling, asymmetry, or associated skin lesions. No contractures.  Skin & Extremity Inspection: Skin color, temperature, and hair growth are WNL. No peripheral edema or cyanosis. No masses, redness, swelling, asymmetry, or associated skin lesions. No contractures.  Functional ROM: Pain restricted ROM for hip and knee joints          Functional ROM: Pain restricted ROM for hip and knee joints          Muscle Tone/Strength: Functionally intact. No obvious neuro-muscular anomalies detected.  Muscle  Tone/Strength: Functionally intact. No obvious neuro-muscular anomalies detected.  Sensory (Neurological): Dermatomal pain pattern        Sensory (Neurological): Dermatomal pain pattern        DTR: Patellar: deferred today Achilles: deferred today Plantar: deferred today  DTR: Patellar: deferred today Achilles: deferred today Plantar: deferred today  Palpation: No palpable anomalies  Palpation: No palpable anomalies   Assessment   Status Diagnosis  Persistent Persistent Persistent 1. Postlaminectomy syndrome, lumbar (Bilateral L5/S1 Lami)   2. Lumbar radiculopathy   3. Myofascial pain syndrome   4. Myofascial pain   5. Primary osteoarthritis of left knee      Updated Problems: Problem  Postlaminectomy syndrome, lumbar (Bilateral L5/S1 Lami)  Myofascial Pain  Primary Osteoarthritis of Left Knee  Lumbar Radiculopathy    Plan of Care   1. Postlaminectomy syndrome, lumbar (Bilateral L5/S1 Lami) - SCS - Trial (OP-AMB) (Schedule); Future -We discussed the indications for spinal cord stimulation, specifically stating that it is typically better for neuropathic pain, but that we have had some success with high frequency stimulation in the treatment of low back and hip pain. Patient is interested in proceeding with spinal cord stimulation trial. Risks and benefits reviewed. She understands that this may not be successful, and that spinal cord stimulation in general is not a "magic bullet."   2. Lumbar radiculopathy -SCS trial as above  3. Myofascial pain syndrome -Cervical/Thoracic TPI and dry needled - [facility] (Schedule); Future  4. Primary osteoarthritis of left knee - s/p left and right genicular nerve radiofrequency ablation.  Right provided pain relief and improvement in function the patient is continued to struggle with left knee pain. - AMB referral to orthopedics   Orders:  Orders Placed This Encounter  Procedures  . SCS - Trial (OP-AMB) (Schedule)    Contact medical  implant company representative to notify them of the scheduled case and to make sure they will be available to provide required equipment.    Standing Status:   Future    Standing Expiration Date:   02/26/2020    Scheduling Instructions:     Side: Bilateral     Level: Lumbar     Device: Medtronic     Sedation: With sedation     Timeframe: Mon Jan 4th 2021    Order Specific Question:   Where will this procedure be performed?    Answer:   [facility]  . [facility] (Schedule)    Standing Status:   Future    Standing Expiration Date:   09/28/2019    Scheduling Instructions:     Neck/trap TPI    Order Specific Question:   Where will this procedure be performed?    Answer:   [facility]  . AMB referral to orthopedics    Referral Priority:   Routine    Referral Type:   Consultation    Referred to Provider:   Duanne Guess, PA-C    Number of Visits Requested:   1    Referral Orders     AMB referral to orthopedics Planned follow-up:   Return in about 7 weeks (around 10/15/2019) for SCS Trial (Medtronic) block 3 hrs.     s/p L GN RFA on 7/20-helpful and TPI 7/20; status post left hip injection in greater trochanteric bursa injection with orthopedics [facility]) not helpful.  History of L5-S1 laminectomies, status post caudal ESI as well as cervical TPI on 05/28/2019.  PRN left knee genicular radiofrequency ablation.  Repeat on 07/18/2019. Right Genicular RFA on 08/13/2019. Plan for SCS trial.      Recent Visits Date Type Provider Dept  08/13/19 Procedure visit Abigail Santa, MD [facility]  07/18/19 Procedure visit Abigail Santa, MD [facility]  07/02/19 Procedure visit Abigail Santa, MD [facility]  06/11/19 Procedure visit Abigail Santa, MD [facility]  06/07/19 Office Visit Abigail Santa, MD [facility]  Showing recent visits within past 90 days and meeting all other requirements   Today's Visits Date Type Provider Dept   08/29/19 Office Visit Abigail Santa, MD [facility]  Showing today's visits and meeting all other requirements   Future Appointments Date Type Provider Dept  09/03/19 Appointment Abigail [name], [facility]  10/15/19 Appointment  Abigail Santa, MD [facility]  Showing future appointments within next 90 days and meeting all other requirements   Primary Care Physician: Abigail [name]., PA-C Location: [facility] Note by: Abigail Santa, MD Date: 08/29/2019; Time: 2:37 PM  Note: This dictation was prepared with Dragon dictation. Any transcriptional errors that may result from this process are unintentional.

## 2019-09-03 ENCOUNTER — Other Ambulatory Visit: Payer: Self-pay

## 2019-09-03 ENCOUNTER — Encounter: Payer: Self-pay | Admitting: Student in an Organized Health Care Education/Training Program

## 2019-09-03 ENCOUNTER — Ambulatory Visit
Payer: Medicare Other | Attending: Student in an Organized Health Care Education/Training Program | Admitting: Student in an Organized Health Care Education/Training Program

## 2019-09-03 VITALS — BP 129/94 | HR 100 | Temp 97.4°F | Resp 16 | Ht 65.0 in | Wt 167.0 lb

## 2019-09-03 DIAGNOSIS — M7918 Myalgia, other site: Secondary | ICD-10-CM | POA: Diagnosis not present

## 2019-09-03 MED ORDER — DEXAMETHASONE SODIUM PHOSPHATE 10 MG/ML IJ SOLN
INTRAMUSCULAR | Status: AC
  Filled 2019-09-03: qty 1

## 2019-09-03 MED ORDER — ROPIVACAINE HCL 2 MG/ML IJ SOLN: 9.0000 mL | Freq: Once | INTRAMUSCULAR | Status: DC

## 2019-09-03 MED ORDER — ROPIVACAINE HCL 2 MG/ML IJ SOLN
INTRAMUSCULAR | Status: AC
  Filled 2019-09-03: qty 10

## 2019-09-03 NOTE — Patient Instructions (Signed)

## 2019-09-03 NOTE — Progress Notes (Signed)
Patient's Name: Abigail [name]  MRN: [id number]  Referring Provider: Aletha Halim., PA-C  DOB: [birth year]  PCP: Aletha Halim., PA-C  DOS: 09/03/2019  Note by: Gillis Santa, MD  Service setting: Ambulatory outpatient  Specialty: Interventional Pain Management  Patient type: Established  Location: [facility] (AMB) [facility] Facility  Visit type: Interventional Procedure   Primary Reason for Visit: Interventional Pain Management Treatment. CC: neck and shoulder blade pain  Procedure:          Anesthesia, Analgesia, Anxiolysis:  Type: Trigger Point Injection (3+) #3  CPT: 20553 Primary Purpose: Therapeutic Region: Posterior Cervicothoracic Level: Cervico-thoracic Target Area: Cervical, thoracic, periscapular trigger Point Approach: Percutaneous, ipsilateral approach. Laterality: Bilateral        Type: Local Anesthesia    Indications: Myofascial pain syndrome  Pain Score: Pre-procedure: 9 /10 Post-procedure: 4/10   Pre-op Assessment:  Ms. Vader is a 53 y.o. (year old), female patient, seen today for interventional treatment. She  has a past surgical history that includes left foot bone spur surgery (10 yrs ago); Abdominal hysterectomy (14 yrs ago); Lumbar laminectomy/decompression microdiscectomy (07/13/2012); Lumbar laminectomy/decompression microdiscectomy (Right, 03/07/2013); Hemi-microdiscectomy lumbar laminectomy level 1 (N/A, 09/28/2013); Breast biopsy (Left, 04/10/2015); Cholecystectomy; Eye surgery; Back surgery; and Anterior cervical decompression/discectomy fusion 4 level (N/A, 10/18/2018). Ms. Clayman has a current medication list which includes the following prescription(s): acetaminophen, amlodipine-olmesartan, atorvastatin, celecoxib, cholecalciferol, clonazepam, duloxetine, fluticasone, fremanezumab-vfrm, hydrocortisone, methocarbamol, metoprolol succinate, nortriptyline, prednisone, pregabalin, rizatriptan, and tizanidine, and the following Facility-Administered  Medications: ropivacaine (pf) 2 mg/ml (0.2%) and ropivacaine (pf) 2 mg/ml (0.2%). Her primarily concern today is the Neck Pain (bilateral )  Initial Vital Signs:  Pulse/HCG Rate: 100  Temp: (!) 97.4 F (36.3 C) Resp: 16 BP: (!) 129/94 SpO2: 100 %  BMI: Estimated body mass index is 27.79 kg/m as calculated from the following:   Height as of this encounter: 5\' 5"  (1.651 m).   Weight as of this encounter: 167 lb (75.8 kg).  Risk Assessment: Allergies: Reviewed. She has No Known Allergies.  Allergy Precautions: None required Coagulopathies: Reviewed. None identified.  Blood-thinner therapy: None at this time Active Infection(s): Reviewed. None identified. Ms. Ching is afebrile  Site Confirmation: Ms. Metzger was asked to confirm the procedure and laterality before marking the site Procedure checklist: Completed Consent: Before the procedure and under the influence of no sedative(s), amnesic(s), or anxiolytics, the patient was informed of the treatment options, risks and possible complications. To fulfill our ethical and legal obligations, as recommended by the American Medical Association's Code of Ethics, I have informed the patient of my clinical impression; the nature and purpose of the treatment or procedure; the risks, benefits, and possible complications of the intervention; the alternatives, including doing nothing; the risk(s) and benefit(s) of the alternative treatment(s) or procedure(s); and the risk(s) and benefit(s) of doing nothing. The patient was provided information about the general risks and possible complications associated with the procedure. These may include, but are not limited to: failure to achieve desired goals, infection, bleeding, organ or nerve damage, allergic reactions, paralysis, and death. In addition, the patient was informed of those risks and complications associated to the procedure, such as failure to decrease pain; infection; bleeding; organ or nerve  damage with subsequent damage to sensory, motor, and/or autonomic systems, resulting in permanent pain, numbness, and/or weakness of one or several areas of the body; allergic reactions; (i.e.: anaphylactic reaction); and/or death. Furthermore, the patient was informed of those risks and complications associated with the medications. These  include, but are not limited to: allergic reactions (i.e.: anaphylactic or anaphylactoid reaction(s)); adrenal axis suppression; blood sugar elevation that in diabetics may result in ketoacidosis or comma; water retention that in patients with history of congestive heart failure may result in shortness of breath, pulmonary edema, and decompensation with resultant heart failure; weight gain; swelling or edema; medication-induced neural toxicity; particulate matter embolism and blood vessel occlusion with resultant organ, and/or nervous system infarction; and/or aseptic necrosis of one or more joints. Finally, the patient was informed that Medicine is not an exact science; therefore, there is also the possibility of unforeseen or unpredictable risks and/or possible complications that may result in a catastrophic outcome. The patient indicated having understood very clearly. We have given the patient no guarantees and we have made no promises. Enough time was given to the patient to ask questions, all of which were answered to the patient's satisfaction. Ms. Vatter has indicated that she wanted to continue with the procedure. Attestation: I, the ordering provider, attest that I have discussed with the patient the benefits, risks, side-effects, alternatives, likelihood of achieving goals, and potential problems during recovery for the procedure that I have provided informed consent. Date  Time: 09/03/2019  8:40 AM  Pre-Procedure Preparation:  Monitoring: As per clinic protocol. Respiration, ETCO2, SpO2, BP, heart rate and rhythm monitor placed and checked for adequate  function Safety Precautions: Patient was assessed for positional comfort and pressure points before starting the procedure. Time-out: I initiated and conducted the "Time-out" before starting the procedure, as per protocol. The patient was asked to participate by confirming the accuracy of the "Time Out" information. Verification of the correct person, site, and procedure were performed and confirmed by me, the nursing staff, and the patient. "Time-out" conducted as per Joint Commission's Universal Protocol ([email or web address].01.01.01). Time: 0912  Description of Procedure:          Area Prepped: Entire Posterior Cervicothoracic Region Prepping solution: DuraPrep (Iodine Povacrylex [0.7% available iodine] and Isopropyl Alcohol, 74% w/w) Safety Precautions: Aspiration looking for blood return was conducted prior to all injections. At no point did we inject any substances, as a needle was being advanced. No attempts were made at seeking any paresthesias. Safe injection practices and needle disposal techniques used. Medications properly checked for expiration dates. SDV (single dose vial) medications used. Description of the Procedure: Protocol guidelines were followed. The patient was placed in position over the fluoroscopy table. The target area was identified and the area prepped in the usual manner. Skin & deeper tissues infiltrated with local anesthetic. Appropriate amount of time allowed to pass for local anesthetics to take effect. The procedure needles were then advanced to the target area. Proper needle placement secured. Negative aspiration confirmed. Solution injected in intermittent fashion, asking for systemic symptoms every 0.5cc of injectate. The needles were then removed and the area cleansed, making sure to leave some of the prepping solution back to take advantage of its long term bactericidal properties.  Vitals:   09/03/19 0849  BP: (!) 129/94  Pulse: 100  Resp: 16  Temp: (!) 97.4 F (36.3 C)   TempSrc: Temporal  SpO2: 100%  Weight: 167 lb (75.8 kg)  Height: 5\' 5"  (1.651 m)    Start Time: 0912 hrs. End Time: 0919 hrs. Materials:  Needle(s) Type: Regular needle Gauge: 25G Length: 1.5-in Medication(s): Please see orders for medications and dosing details. Approximately 20 trigger points injected with 0.5 to 1 cc of 0.2% ropivacaine with dry needling performed at each trigger point.  Imaging Guidance:          Type of Imaging Technique: None used Indication(s): N/A Exposure Time: No patient exposure Contrast: None used. Fluoroscopic Guidance: N/A Ultrasound Guidance: N/A Interpretation: N/A  Antibiotic Prophylaxis:   Anti-infectives (From admission, onward)   None     Indication(s): None identified  Post-operative Assessment:  Post-procedure Vital Signs:  Pulse/HCG Rate: 100  Temp: (!) 97.4 F (36.3 C) Resp: 16 BP: (!) 129/94 SpO2: 100 %  EBL: None  Complications: No immediate post-treatment complications observed by team, or reported by patient.  Note: The patient tolerated the entire procedure well. A repeat set of vitals were taken after the procedure and the patient was kept under observation following institutional policy, for this type of procedure. Post-procedural neurological assessment was performed, showing return to baseline, prior to discharge. The patient was provided with post-procedure discharge instructions, including a section on how to identify potential problems. Should any problems arise concerning this procedure, the patient was given instructions to immediately contact us, at any time, without hesitation. In any case, we plan to contact the patient by telephone for a follow-up status report regarding this interventional procedure.  Comments:  No additional relevant information.  Plan of Care  Medications ordered for procedure: Meds ordered this encounter  Medications  . ropivacaine (PF) 2 mg/mL (0.2%) (NAROPIN) injection 9 mL  .  ropivacaine (PF) 2 mg/mL (0.2%) (NAROPIN) injection 9 mL    Follow-up plan:   Return if symptoms worsen or fail to improve.     Recent Visits Date Type Provider Dept  08/29/19 Office Visit Gillis Santa, MD [facility]  08/13/19 Procedure visit Gillis Santa, MD [facility]  07/18/19 Procedure visit Gillis Santa, MD [facility]  07/02/19 Procedure visit Gillis Santa, MD [facility]  06/11/19 Procedure visit Gillis Santa, MD [facility]  06/07/19 Office Visit Gillis Santa, MD [facility]  Showing recent visits within past 90 days and meeting all other requirements   Today's Visits Date Type Provider Dept  09/03/19 Procedure visit Gillis Santa, MD [facility]  Showing today's visits and meeting all other requirements   Future Appointments Date Type Provider Dept  10/01/19 Appointment Gillis Santa, MD [facility]  10/15/19 Appointment Gillis Santa, MD [facility]  Showing future appointments within next 90 days and meeting all other requirements   Disposition: Discharge home  Discharge Date & Time: 09/03/2019; 0920 hrs.   Primary Care Physician: Aletha Halim., PA-C Location: [facility] Note by: Gillis Santa, MD Date: 09/03/2019; Time: 9:52 AM  Disclaimer:  Medicine is not an exact science. The only guarantee in medicine is that nothing is guaranteed. It is important to note that the decision to proceed with this intervention was based on the information collected from the patient. The Data and conclusions were drawn from the patient's questionnaire, the interview, and the physical examination. Because the information was provided in large part by the patient, it cannot be guaranteed that it has not been purposely or unconsciously manipulated. Every effort has been made to obtain as much relevant data as possible for this evaluation. It is important to  note that the conclusions that lead to this procedure are derived in large part from the available data. Always take into account that the treatment will also be dependent on availability of resources and existing treatment guidelines, considered by other Pain Management Practitioners as being common knowledge and practice, at the  time of the intervention. For Medico-Legal purposes, it is also important to point out that variation in procedural techniques and pharmacological choices are the acceptable norm. The indications, contraindications, technique, and results of the above procedure should only be interpreted and judged by a Board-Certified Interventional Pain Specialist with extensive familiarity and expertise in the same exact procedure and technique.

## 2019-09-03 NOTE — Progress Notes (Signed)
Safety precautions to be maintained throughout the outpatient stay will include: orient to surroundings, keep bed in low position, maintain call bell within reach at all times, provide assistance with transfer out of bed and ambulation.  

## 2019-09-04 ENCOUNTER — Telehealth: Payer: Self-pay

## 2019-09-04 NOTE — Telephone Encounter (Signed)
Post procedure phone call.  Patient states he is doing good.  

## 2019-09-05 ENCOUNTER — Encounter: Payer: Self-pay | Admitting: Student in an Organized Health Care Education/Training Program

## 2019-09-05 ENCOUNTER — Telehealth: Payer: Self-pay | Admitting: *Deleted

## 2019-09-05 NOTE — Telephone Encounter (Signed)
Called patient in regards to her email today. Per Dr. Holley Raring there is nothing else we can do at this point. She is already on a prednisone daily dose so a taper is not recommended. She refused a referral to [facility] to see if they could do her SCS trial before 01/04. She stated she wanted Dr. Holley Raring to do the trial.

## 2019-09-08 ENCOUNTER — Encounter: Payer: Self-pay | Admitting: Student in an Organized Health Care Education/Training Program

## 2019-09-11 ENCOUNTER — Telehealth: Payer: Self-pay | Admitting: Student in an Organized Health Care Education/Training Program

## 2019-09-11 NOTE — Telephone Encounter (Signed)
Patient lvmail stating she has a new pain since procedure and would like to speak with Dr. Holley Raring about this. He has NO openings for appts other than procedures. She is scheduled for procedure on 12-21. Please call patient to discuss and let Dr. Holley Raring know what is going on

## 2019-09-12 NOTE — Telephone Encounter (Signed)
This was discussed with patient and Dr. Holley Raring yesterday.

## 2019-09-24 ENCOUNTER — Other Ambulatory Visit: Payer: Self-pay | Admitting: Student in an Organized Health Care Education/Training Program

## 2019-09-24 DIAGNOSIS — M7918 Myalgia, other site: Secondary | ICD-10-CM

## 2019-10-01 ENCOUNTER — Other Ambulatory Visit: Payer: Self-pay

## 2019-10-01 ENCOUNTER — Ambulatory Visit
Payer: Medicare Other | Attending: Student in an Organized Health Care Education/Training Program | Admitting: Student in an Organized Health Care Education/Training Program

## 2019-10-01 ENCOUNTER — Encounter: Payer: Self-pay | Admitting: Student in an Organized Health Care Education/Training Program

## 2019-10-01 VITALS — BP 144/103 | HR 103 | Temp 97.7°F | Resp 16 | Ht 64.0 in | Wt 167.0 lb

## 2019-10-01 DIAGNOSIS — M4322 Fusion of spine, cervical region: Secondary | ICD-10-CM | POA: Diagnosis not present

## 2019-10-01 DIAGNOSIS — M25511 Pain in right shoulder: Secondary | ICD-10-CM | POA: Insufficient documentation

## 2019-10-01 DIAGNOSIS — Z9049 Acquired absence of other specified parts of digestive tract: Secondary | ICD-10-CM | POA: Insufficient documentation

## 2019-10-01 DIAGNOSIS — M7918 Myalgia, other site: Secondary | ICD-10-CM

## 2019-10-01 MED ORDER — ROPIVACAINE HCL 2 MG/ML IJ SOLN
9.0000 mL | Freq: Once | INTRAMUSCULAR | Status: AC
  Administered 2019-10-01: 9 mL
  Filled 2019-10-01: qty 10

## 2019-10-01 MED ORDER — KETOROLAC TROMETHAMINE 30 MG/ML IJ SOLN
30.0000 mg | Freq: Once | INTRAMUSCULAR | Status: AC
  Administered 2019-10-01: 30 mg via INTRAMUSCULAR

## 2019-10-01 MED ORDER — ORPHENADRINE CITRATE 30 MG/ML IJ SOLN
30.0000 mg | Freq: Once | INTRAMUSCULAR | Status: AC
  Administered 2019-10-01: 30 mg via INTRAMUSCULAR

## 2019-10-01 MED ORDER — ORPHENADRINE CITRATE 30 MG/ML IJ SOLN
INTRAMUSCULAR | Status: AC
  Filled 2019-10-01: qty 2

## 2019-10-01 MED ORDER — KETOROLAC TROMETHAMINE 30 MG/ML IJ SOLN
INTRAMUSCULAR | Status: AC
  Filled 2019-10-01: qty 1

## 2019-10-01 NOTE — Patient Instructions (Addendum)
____________________________________________________________________________________________  Post-Procedure Discharge Instructions  Instructions:  Apply ice:   Purpose: This will minimize any swelling and discomfort after procedure.   When: Day of procedure, as soon as you get home.  How: Fill a plastic sandwich bag with crushed ice. Cover it with a small towel and apply to injection site.  How long: (15 min on, 15 min off) Apply for 15 minutes then remove x 15 minutes.  Repeat sequence on day of procedure, until you go to bed.  Apply heat:   Purpose: To treat any soreness and discomfort from the procedure.  When: Starting the next day after the procedure.  How: Apply heat to procedure site starting the day following the procedure.  How long: May continue to repeat daily, until discomfort goes away.  Food intake: Start with clear liquids (like water) and advance to regular food, as tolerated.   Physical activities: Keep activities to a minimum for the first 8 hours after the procedure. After that, then as tolerated.  Driving: If you have received any sedation, be responsible and do not drive. You are not allowed to drive for 24 hours after having sedation.  Blood thinner: (Applies only to those taking blood thinners) You may restart your blood thinner 6 hours after your procedure.  Insulin: (Applies only to Diabetic patients taking insulin) As soon as you can eat, you may resume your normal dosing schedule.  Infection prevention: Keep procedure site clean and dry. Shower daily and clean area with soap and water.  Post-procedure Pain Diary: Extremely important that this be done correctly and accurately. Recorded information will be used to determine the next step in treatment. For the purpose of accuracy, follow these rules:  Evaluate only the area treated. Do not report or include pain from an untreated area. For the purpose of this evaluation, ignore all other areas of pain,  except for the treated area.  After your procedure, avoid taking a long nap and attempting to complete the pain diary after you wake up. Instead, set your alarm clock to go off every hour, on the hour, for the initial 8 hours after the procedure. Document the duration of the numbing medicine, and the relief you are getting from it.  Do not go to sleep and attempt to complete it later. It will not be accurate. If you received sedation, it is likely that you were given a medication that may cause amnesia. Because of this, completing the diary at a later time may cause the information to be inaccurate. This information is needed to plan your care.  Follow-up appointment: Keep your post-procedure follow-up evaluation appointment after the procedure (usually 2 weeks for most procedures, 6 weeks for radiofrequencies). DO NOT FORGET to bring you pain diary with you.   Expect: (What should I expect to see with my procedure?)  From numbing medicine (AKA: Local Anesthetics): Numbness or decrease in pain. You may also experience some weakness, which if present, could last for the duration of the local anesthetic.  Onset: Full effect within 15 minutes of injected.  Duration: It will depend on the type of local anesthetic used. On the average, 1 to 8 hours.   From steroids (Applies only if steroids were used): Decrease in swelling or inflammation. Once inflammation is improved, relief of the pain will follow.  Onset of benefits: Depends on the amount of swelling present. The more swelling, the longer it will take for the benefits to be seen. In some cases, up to 10 days.    Duration: Steroids will stay in the system x 2 weeks. Duration of benefits will depend on multiple posibilities including persistent irritating factors.  Side-effects: If present, they may typically last 2 weeks (the duration of the steroids).  Frequent: Cramps (if they occur, drink Gatorade and take over-the-counter Magnesium 450-500 mg  once to twice a day); water retention with temporary weight gain; increases in blood sugar; decreased immune system response; increased appetite.  Occasional: Facial flushing (red, warm cheeks); mood swings; menstrual changes.  Uncommon: Long-term decrease or suppression of natural hormones; bone thinning. (These are more common with higher doses or more frequent use. This is why we prefer that our patients avoid having any injection therapies in other practices.)   Very Rare: Severe mood changes; psychosis; aseptic necrosis.  From procedure: Some discomfort is to be expected once the numbing medicine wears off. This should be minimal if ice and heat are applied as instructed.  Call if: (When should I call?)  You experience numbness and weakness that gets worse with time, as opposed to wearing off.  New onset bowel or bladder incontinence. (Applies only to procedures done in the spine)  Emergency Numbers:  Durning business hours (Monday - Thursday, 8:00 AM - 4:00 PM) (Friday, 9:00 AM - 12:00 Noon): [phone]  After hours: [phone]  NOTE: If you are having a problem and are unable connect with, or to talk to a provider, then go to your nearest urgent care or emergency department. If the problem is serious and urgent, please call 911. ____________________________________________________________________________________________   [phone]

## 2019-10-01 NOTE — Progress Notes (Signed)
Safety precautions to be maintained throughout the outpatient stay will include: orient to surroundings, keep bed in low position, maintain call bell within reach at all times, provide assistance with transfer out of bed and ambulation.  

## 2019-10-01 NOTE — Progress Notes (Signed)
Patient's Name: Abigail [name]  MRN: [id number]  Referring Provider: Aletha Halim., PA-C  DOB: Nov 05, 1965  PCP: Aletha Halim., PA-C  DOS: 10/01/2019  Note by: Gillis Santa, MD  Service setting: Ambulatory outpatient  Specialty: Interventional Pain Management  Patient type: Established  Location: [facility] (AMB) Pain Management Facility  Visit type: Interventional Procedure   Primary Reason for Visit: Interventional Pain Management Treatment. CC: neck and shoulder blade pain  Procedure:          Anesthesia, Analgesia, Anxiolysis:  Type: Trigger Point Injection (3+) #3  CPT: 20553 Primary Purpose: Therapeutic Region: Posterior Cervicothoracic Level: Cervico-thoracic Target Area: Cervical, thoracic, periscapular trigger Point Approach: Percutaneous, ipsilateral approach. Laterality: Bilateral        Type: Local Anesthesia    Indications: Myofascial pain syndrome  Pain Score: Pre-procedure: 10-Worst pain ever/10 Post-procedure: 4/10   Pre-op Assessment:  Abigail [name] is a 53 y.o. (year old), female patient, seen today for interventional treatment. She  has a past surgical history that includes left foot bone spur surgery (10 yrs ago); Abdominal hysterectomy (14 yrs ago); Lumbar laminectomy/decompression microdiscectomy (07/13/2012); Lumbar laminectomy/decompression microdiscectomy (Right, 03/07/2013); Hemi-microdiscectomy lumbar laminectomy level 1 (N/A, 09/28/2013); Breast biopsy (Left, 04/10/2015); Cholecystectomy; Eye surgery; Back surgery; and Anterior cervical decompression/discectomy fusion 4 level (N/A, 10/18/2018). Abigail [name] has a current medication list which includes the following prescription(s): acetaminophen, amlodipine-olmesartan, atorvastatin, celecoxib, cholecalciferol, clonazepam, duloxetine, fluticasone, fremanezumab-vfrm, hydrocortisone, methocarbamol, metoprolol succinate, nortriptyline, prednisone, pregabalin, rizatriptan, and tizanidine. Her primarily concern today is  the Shoulder Pain (right )  Initial Vital Signs:  Pulse/HCG Rate: (!) 103  Temp: 97.7 F (36.5 C) Resp: 16 BP: (!) 144/103 SpO2: 99 %  BMI: Estimated body mass index is 28.67 kg/m as calculated from the following:   Height as of this encounter: 5\' 4"  (1.626 m).   Weight as of this encounter: 167 lb (75.8 kg).  Risk Assessment: Allergies: Reviewed. She has No Known Allergies.  Allergy Precautions: None required Coagulopathies: Reviewed. None identified.  Blood-thinner therapy: None at this time Active Infection(s): Reviewed. None identified. Abigail [name] is afebrile  Site Confirmation: Abigail [name] was asked to confirm the procedure and laterality before marking the site Procedure checklist: Completed Consent: Before the procedure and under the influence of no sedative(s), amnesic(s), or anxiolytics, the patient was informed of the treatment options, risks and possible complications. To fulfill our ethical and legal obligations, as recommended by the American Medical Association's Code of Ethics, I have informed the patient of my clinical impression; the nature and purpose of the treatment or procedure; the risks, benefits, and possible complications of the intervention; the alternatives, including doing nothing; the risk(s) and benefit(s) of the alternative treatment(s) or procedure(s); and the risk(s) and benefit(s) of doing nothing. The patient was provided information about the general risks and possible complications associated with the procedure. These may include, but are not limited to: failure to achieve desired goals, infection, bleeding, organ or nerve damage, allergic reactions, paralysis, and death. In addition, the patient was informed of those risks and complications associated to the procedure, such as failure to decrease pain; infection; bleeding; organ or nerve damage with subsequent damage to sensory, motor, and/or autonomic systems, resulting in permanent pain, numbness,  and/or weakness of one or several areas of the body; allergic reactions; (i.e.: anaphylactic reaction); and/or death. Furthermore, the patient was informed of those risks and complications associated with the medications. These include, but are not limited to: allergic reactions (i.e.: anaphylactic or anaphylactoid reaction(s)); adrenal axis  suppression; blood sugar elevation that in diabetics may result in ketoacidosis or comma; water retention that in patients with history of congestive heart failure may result in shortness of breath, pulmonary edema, and decompensation with resultant heart failure; weight gain; swelling or edema; medication-induced neural toxicity; particulate matter embolism and blood vessel occlusion with resultant organ, and/or nervous system infarction; and/or aseptic necrosis of one or more joints. Finally, the patient was informed that Medicine is not an exact science; therefore, there is also the possibility of unforeseen or unpredictable risks and/or possible complications that may result in a catastrophic outcome. The patient indicated having understood very clearly. We have given the patient no guarantees and we have made no promises. Enough time was given to the patient to ask questions, all of which were answered to the patient's satisfaction. Abigail [name] has indicated that she wanted to continue with the procedure. Attestation: I, the ordering provider, attest that I have discussed with the patient the benefits, risks, side-effects, alternatives, likelihood of achieving goals, and potential problems during recovery for the procedure that I have provided informed consent. Date  Time: 10/01/2019  8:34 AM  Pre-Procedure Preparation:  Monitoring: As per clinic protocol. Respiration, ETCO2, SpO2, BP, heart rate and rhythm monitor placed and checked for adequate function Safety Precautions: Patient was assessed for positional comfort and pressure points before starting the  procedure. Time-out: I initiated and conducted the "Time-out" before starting the procedure, as per protocol. The patient was asked to participate by confirming the accuracy of the "Time Out" information. Verification of the correct person, site, and procedure were performed and confirmed by me, the nursing staff, and the patient. "Time-out" conducted as per Joint Commission's Universal Protocol (UP.01.01.01). Time: 0901  Description of Procedure:          Area Prepped: Entire Posterior Cervicothoracic Region Prepping solution: DuraPrep (Iodine Povacrylex [0.7% available iodine] and Isopropyl Alcohol, 74% w/w) Safety Precautions: Aspiration looking for blood return was conducted prior to all injections. At no point did we inject any substances, as a needle was being advanced. No attempts were made at seeking any paresthesias. Safe injection practices and needle disposal techniques used. Medications properly checked for expiration dates. SDV (single dose vial) medications used. Description of the Procedure: Protocol guidelines were followed. The patient was placed in position over the fluoroscopy table. The target area was identified and the area prepped in the usual manner. Skin & deeper tissues infiltrated with local anesthetic. Appropriate amount of time allowed to pass for local anesthetics to take effect. The procedure needles were then advanced to the target area. Proper needle placement secured. Negative aspiration confirmed. Solution injected in intermittent fashion, asking for systemic symptoms every 0.5cc of injectate. The needles were then removed and the area cleansed, making sure to leave some of the prepping solution back to take advantage of its long term bactericidal properties.  Vitals:   10/01/19 0838  BP: (!) 144/103  Pulse: (!) 103  Resp: 16  Temp: 97.7 F (36.5 C)  TempSrc: Temporal  SpO2: 99%  Weight: 167 lb (75.8 kg)  Height: 5\' 4"  (1.626 m)    Start Time: 0901 hrs. End  Time: 0905 hrs. Materials:  Needle(s) Type: Regular needle Gauge: 25G Length: 1.5-in Medication(s): Please see orders for medications and dosing details. Approximately 19 trigger points injected with 0.5 to 1 cc of 0.2% ropivacaine with dry needling performed at each trigger point. Imaging Guidance:          Type of Imaging Technique:  None used Indication(s): N/A Exposure Time: No patient exposure Contrast: None used. Fluoroscopic Guidance: N/A Ultrasound Guidance: N/A Interpretation: N/A  Antibiotic Prophylaxis:   Anti-infectives (From admission, onward)   None     Indication(s): None identified  Post-operative Assessment:  Post-procedure Vital Signs:  Pulse/HCG Rate: (!) 103  Temp: 97.7 F (36.5 C) Resp: 16 BP: (!) 144/103 SpO2: 99 %  EBL: None  Complications: No immediate post-treatment complications observed by team, or reported by patient.  Note: The patient tolerated the entire procedure well. A repeat set of vitals were taken after the procedure and the patient was kept under observation following institutional policy, for this type of procedure. Post-procedural neurological assessment was performed, showing return to baseline, prior to discharge. The patient was provided with post-procedure discharge instructions, including a section on how to identify potential problems. Should any problems arise concerning this procedure, the patient was given instructions to immediately contact us, at any time, without hesitation. In any case, we plan to contact the patient by telephone for a follow-up status report regarding this interventional procedure.  Comments:  No additional relevant information.  Plan of Care  Medications ordered for procedure: Meds ordered this encounter  Medications  . ropivacaine (PF) 2 mg/mL (0.2%) (NAROPIN) injection 9 mL  . ropivacaine (PF) 2 mg/mL (0.2%) (NAROPIN) injection 9 mL  . orphenadrine (NORFLEX) injection 30 mg  . ketorolac (TORADOL)  30 MG/ML injection 30 mg   Patient was tentatively scheduled for spinal cord stimulator trial at the beginning of January however she is switching her insurance companies.  She states that she will call her insurance company today to try and expedite approval.  When this is done, she will contact us to be placed back on schedule for [employer] spinal cord stimulator trial.  Follow-up plan:   Return if symptoms worsen or fail to improve.     Recent Visits Date Type Provider Dept  09/03/19 Procedure visit Gillis Santa, MD [facility]  08/29/19 Office Visit Gillis Santa, MD [facility]  08/13/19 Procedure visit Gillis Santa, MD [facility]  07/18/19 Procedure visit Gillis Santa, MD [facility]  Showing recent visits within past 90 days and meeting all other requirements   Today's Visits Date Type Provider Dept  10/01/19 Procedure visit Gillis Santa, MD [facility]  Showing today's visits and meeting all other requirements   Future Appointments No visits were found meeting these conditions.  Showing future appointments within next 90 days and meeting all other requirements   Disposition: Discharge home  Discharge Date & Time: 10/01/2019; 0914 hrs.   Primary Care Physician: Aletha Halim PA-C Location: [facility] Note by: Gillis Santa, MD Date: 10/01/2019; Time: 9:28 AM  Disclaimer:  Medicine is not an exact science. The only guarantee in medicine is that nothing is guaranteed. It is important to note that the decision to proceed with this intervention was based on the information collected from the patient. The Data and conclusions were drawn from the patient's questionnaire, the interview, and the physical examination. Because the information was provided in large part by the patient, it cannot be guaranteed that it has not been purposely or unconsciously manipulated. Every effort has been made to  obtain as much relevant data as possible for this evaluation. It is important to note that the conclusions that lead to this procedure are derived in large part from the available data. Always take into account that the treatment will also  be dependent on availability of resources and existing treatment guidelines, considered by other Pain Management Practitioners as being common knowledge and practice, at the time of the intervention. For Medico-Legal purposes, it is also important to point out that variation in procedural techniques and pharmacological choices are the acceptable norm. The indications, contraindications, technique, and results of the above procedure should only be interpreted and judged by a Board-Certified Interventional Pain Specialist with extensive familiarity and expertise in the same exact procedure and technique.

## 2019-10-15 ENCOUNTER — Ambulatory Visit: Payer: Medicare Other | Admitting: Student in an Organized Health Care Education/Training Program

## 2019-10-18 ENCOUNTER — Encounter: Payer: Self-pay | Admitting: Student in an Organized Health Care Education/Training Program

## 2019-10-19 ENCOUNTER — Other Ambulatory Visit: Payer: Medicare Other

## 2019-10-23 ENCOUNTER — Other Ambulatory Visit: Payer: Medicare Other

## 2019-10-25 ENCOUNTER — Ambulatory Visit: Admit: 2019-10-25 | Payer: Medicare Other | Admitting: Orthopedic Surgery

## 2019-10-25 SURGERY — ARTHROSCOPY, KNEE, WITH LATERAL RETINACULUM RELEASE
Anesthesia: Choice | Site: Knee | Laterality: Left

## 2019-10-29 ENCOUNTER — Encounter: Payer: Self-pay | Admitting: Student in an Organized Health Care Education/Training Program

## 2019-10-29 ENCOUNTER — Other Ambulatory Visit: Payer: Self-pay

## 2019-10-29 ENCOUNTER — Ambulatory Visit
Admission: RE | Admit: 2019-10-29 | Discharge: 2019-10-29 | Disposition: A | Discharge status: Home / Self Care | Payer: Medicare HMO | Source: Ambulatory Visit | Attending: Student in an Organized Health Care Education/Training Program | Admitting: Student in an Organized Health Care Education/Training Program

## 2019-10-29 ENCOUNTER — Ambulatory Visit (HOSPITAL_BASED_OUTPATIENT_CLINIC_OR_DEPARTMENT_OTHER): Payer: Medicare HMO | Admitting: Student in an Organized Health Care Education/Training Program

## 2019-10-29 DIAGNOSIS — M961 Postlaminectomy syndrome, not elsewhere classified: Secondary | ICD-10-CM | POA: Insufficient documentation

## 2019-10-29 MED ORDER — ROPIVACAINE HCL 2 MG/ML IJ SOLN
INTRAMUSCULAR | Status: AC
  Filled 2019-10-29: qty 10

## 2019-10-29 MED ORDER — LIDOCAINE HCL 2 % IJ SOLN
INTRAMUSCULAR | Status: AC
  Filled 2019-10-29: qty 20

## 2019-10-29 MED ORDER — CEFAZOLIN SODIUM 1 G IJ SOLR
INTRAMUSCULAR | Status: AC
  Filled 2019-10-29: qty 20

## 2019-10-29 MED ORDER — ROPIVACAINE HCL 2 MG/ML IJ SOLN
9.0000 mL | Freq: Once | INTRAMUSCULAR | Status: AC
  Administered 2019-10-29: 10 mL via PERINEURAL

## 2019-10-29 MED ORDER — PREGABALIN 75 MG PO CAPS: 75.0000 mg | ORAL_CAPSULE | Freq: Three times a day (TID) | ORAL | 1 refills | Status: DC

## 2019-10-29 MED ORDER — FENTANYL CITRATE (PF) 100 MCG/2ML IJ SOLN
25.0000 ug | INTRAMUSCULAR | Status: DC | PRN
  Administered 2019-10-29: 25 ug via INTRAVENOUS

## 2019-10-29 MED ORDER — CEPHALEXIN 500 MG PO CAPS: 500.0000 mg | ORAL_CAPSULE | Freq: Four times a day (QID) | ORAL | 0 refills | Status: AC

## 2019-10-29 MED ORDER — MIDAZOLAM HCL 5 MG/5ML IJ SOLN
1.0000 mg | INTRAMUSCULAR | Status: DC | PRN
  Administered 2019-10-29: 09:00:00 0.5 mg via INTRAVENOUS

## 2019-10-29 MED ORDER — LIDOCAINE HCL 2 % IJ SOLN
20.0000 mL | Freq: Once | INTRAMUSCULAR | Status: AC
  Administered 2019-10-29: 400 mg

## 2019-10-29 MED ORDER — MIDAZOLAM HCL 5 MG/5ML IJ SOLN
INTRAMUSCULAR | Status: AC
  Filled 2019-10-29: qty 5

## 2019-10-29 MED ORDER — FENTANYL CITRATE (PF) 100 MCG/2ML IJ SOLN
INTRAMUSCULAR | Status: AC
  Filled 2019-10-29: qty 2

## 2019-10-29 NOTE — Progress Notes (Signed)
Patient: Abigail [name]  Service Category: Procedure  Provider: Gillis Santa, MD  DOB: [birth year]  DOS: 10/29/2019  Location: [facility]  MRN: [id number]  Setting: Ambulatory - outpatient  Referring Provider: Gillis Santa, MD  Type: Established Patient  Specialty: Interventional Pain Management  PCP: Aletha Halim., PA-C    Procedure:  Anesthesia, Analgesia, Anxiolysis:  Type:  Spinal Cord Neurostimulator TRIAL (Percutaneous, interlaminar, posterior epidural placement) MEDTRONIC Purpose: To determine if a permanent implant may be effective in controlling some or all of Abigail [name]'s chronic pain symptoms.  Region: Lumbar Level: T12-L1 Laterality: Bilateral Paramedial  Type: Moderate (Conscious) Sedation combined with Local Anesthesia Indication(s): Analgesia and Anxiety Route: Intravenous (IV) IV Access: Secured Sedation: Meaningful verbal contact was maintained at all times during the procedure  Local Anesthetic: Lidocaine 1-2%   Indications: 1. Postlaminectomy syndrome, lumbar (Bilateral L5/S1 Lami)    Pain Score: Pre-procedure: 8 /10 Post-procedure: 0-No pain/10   Pre-op Assessment:  Abigail [name] is a 54 y.o. (year old), female patient, seen today for interventional treatment. She  has a past surgical history that includes left foot bone spur surgery (10 yrs ago); Abdominal hysterectomy (14 yrs ago); Lumbar laminectomy/decompression microdiscectomy (07/13/2012); Lumbar laminectomy/decompression microdiscectomy (Right, 03/07/2013); Hemi-microdiscectomy lumbar laminectomy level 1 (N/A, 09/28/2013); Breast biopsy (Left, 04/10/2015); Cholecystectomy; Eye surgery; Back surgery; and Anterior cervical decompression/discectomy fusion 4 level (N/A, 10/18/2018).  Initial Vital Signs:  Pulse/EKG Rate: 94ECG Heart Rate: 93 Temp: (!) 97.3 F (36.3 C) Resp: 16 BP: (!) 119/97 SpO2: 97 %  BMI: Estimated body mass index is 27.79 kg/m as calculated from the following:  Height as of this encounter: 5\' 5"  (1.651 m).   Weight as of this encounter: 167 lb (75.8 kg).  Risk Assessment: Allergies: Reviewed. She has No Known Allergies.  Allergy Precautions: None required Coagulopathies: Reviewed. None identified.  Blood-thinner therapy: None at this time Active Infection(s): Reviewed. None identified. Abigail [name] is afebrile  Site Confirmation: Abigail [name] was asked to confirm the procedure and laterality before marking the site, which she did. Procedure checklist: Completed Consent: Before the procedure and under the influence of no sedative(s), amnesic(s), or anxiolytics, the patient was informed of the treatment options, risks and possible complications. To fulfill our ethical and legal obligations, as recommended by the American Medical Association's Code of Ethics, I have informed the patient of my clinical impression; the nature and purpose of the treatment or procedure; the risks, benefits, and possible complications of the intervention; the alternatives, including doing nothing; the risk(s) and benefit(s) of the alternative treatment(s) or procedure(s); and the risk(s) and benefit(s) of doing nothing.  Abigail [name] was provided with information about the general risks and possible complications associated with most interventional procedures. These include, but are not limited to: failure to achieve desired goals, infection, bleeding, organ or nerve damage, allergic reactions, paralysis, and/or death.  In addition, she was informed of those risks and possible complications associated to this particular procedure, which include, but are not limited to: damage to the implant; failure to decrease pain; local, systemic, or serious CNS infections, intraspinal abscess with possible cord compression and paralysis, or life-threatening such as meningitis; intrathecal and/or epidural bleeding with formation of hematoma with possible spinal cord compression and permanent  paralysis; organ damage; nerve injury or damage with subsequent sensory, motor, and/or autonomic system dysfunction, resulting in transient or permanent pain, numbness, and/or weakness of one or several areas of the body; allergic reactions, either minor or major life-threatening, such as  anaphylactic or anaphylactoid reactions.  Furthermore, Abigail [name] was informed of those risks and complications associated with the medications. These include, but are not limited to: allergic reactions (i.e.: anaphylactic or anaphylactoid reactions); arrhythmia;  Hypotension/hypertension; cardiovascular collapse; respiratory depression and/or shortness of breath; swelling or edema; medication-induced neural toxicity; particulate matter embolism and blood vessel occlusion with resultant organ, and/or nervous system infarction and permanent paralysis.  Finally, she was informed that Medicine is not an exact science; therefore, there is also the possibility of unforeseen or unpredictable risks and/or possible complications that may result in a catastrophic outcome. The patient indicated having understood very clearly. We have given the patient no guarantees and we have made no promises. Enough time was given to the patient to ask questions, all of which were answered to the patient's satisfaction. Abigail [name] has indicated that she wanted to continue with the procedure. Attestation: I, the ordering provider, attest that I have discussed with the patient the benefits, risks, side-effects, alternatives, likelihood of achieving goals, and potential problems during recovery for the procedure that I have provided informed consent. Date  Time: 10/29/2019  7:52 AM  Pre-Procedure Preparation:  Monitoring: As per clinic protocol. Respiration, ETCO2, SpO2, BP, heart rate and rhythm monitor placed and checked for adequate function Safety Precautions: Patient was assessed for positional comfort and pressure points before starting  the procedure. Time-out: I initiated and conducted the "Time-out" before starting the procedure, as per protocol. The patient was asked to participate by confirming the accuracy of the "Time Out" information. Verification of the correct person, site, and procedure were performed and confirmed by me, the nursing staff, and the patient. "Time-out" conducted as per Joint Commission's Universal Protocol (UP.01.01.01). Time: 0847  Description of Procedure Process:   Position: Prone Target Area: Posterior epidural space Approach: Posterior percutaneous, paramedial, interlaminar approach Area Prepped: Bilateral thoraco-lumbar Region Prepping solution: ChloraPrep (2% chlorhexidine gluconate and 70% isopropyl alcohol) Safety Precautions: Safe injection practices and needle disposal techniques used. Medications properly checked for expiration dates. SDV (single dose vial) medications used. Aspiration looking for blood return and/or CSF was conducted prior to all injections. At no point did I inject any substances, as a needle was being advanced. No attempts were made at seeking any paresthesias.  Description of the Procedure: Availability of a responsible, adult driver, and NPO status confirmed. Informed consent was obtained after having discussed risks and possible complications. An IV was started. The patient was then taken to the fluoroscopy suite, where the patient was placed in position for the procedure, over the fluoroscopy table. The patient was then monitored in the usual manner. Fluoroscopy was manipulated to obtain the best possible view of the target. Parallex error was corrected before commencing the procedure. Once a clear view of the target had been obtained, the skin and deeper tissues over the procedure site were infiltrated using lidocaine, loaded in a 10 cc luer-loc syringe with a 0.5 inch, 25-G needle. The introducer needle(s) was/were then inserted through the skin and deeper tissues. A  paramidline approach was used to enter the posterior epidural space at a 30 angle, using "Loss-of-resistance Technique" with 3 ml of PF-NaCl (0.9% NSS). Correct needle placement was confirmed in the antero-posterior and lateral fluoroscopic views. The lead was gently introduced and manipulated under real-time fluoroscopy, constantly assessing for pain, discomfort, or paresthesias, until the tip rested at the desired level. Both sides were done in identical fashion. Electrode placement was tested until appropriate coverage was attained. Once the patient confirmed that  the stimulation was over the desired area, the lead(s) was/were secured in place and the introducer needles removed. This was done under real-time fluoroscopy while observing the electrode tip to avoid unintended migration. The area was covered with a non-occlusive dressing and the patient transported to recovery for further programming.  Vitals:   10/29/19 0941 10/29/19 0947 10/29/19 0957 10/29/19 1007  BP: (!) 124/93 (!) 130/96 118/79 (!) 133/95  Pulse:      Resp: 17 16 15 16   Temp:  (!) 97.2 F (36.2 C)    TempSrc:      SpO2: 99% 100% 100% 97%  Weight:      Height:       Start Time: 0847 hrs. End Time: 0933 hrs.  Neurostimulator Details:   Lead(s):  Brand: Medtronic Epidural Access Level:  T12-L1 T12-L1  Lead implant:  Bilateral   Laterality:  Left Right  Top electrode location:  T8 (top) T8 (mid)  Model No.:   Medtronic [id number] Same  Length: 60 cm Same  Lot No.:  [id number]          [id number]  MRI compatibility:  Yes            Imaging Guidance (Spinal):          Type of Imaging Technique: Fluoroscopy Guidance (Spinal) Indication(s): Assistance in needle guidance and placement for procedures requiring needle placement in or near specific anatomical locations not easily accessible without such assistance. Exposure Time: Please see nurses notes. Contrast: None used. Fluoroscopic Guidance: I was personally present  during the use of fluoroscopy. "Tunnel Vision Technique" used to obtain the best possible view of the target area. Parallax error corrected before commencing the procedure. "Direction-depth-direction" technique used to introduce the needle under continuous pulsed fluoroscopy. Once target was reached, antero-posterior, oblique, and lateral fluoroscopic projection used confirm needle placement in all planes. Images permanently stored in EMR. Interpretation: No contrast injected. I personally interpreted the imaging intraoperatively. Adequate needle placement confirmed in multiple planes. Permanent images saved into the patient's record.       Antibiotic Prophylaxis:   Anti-infectives (From admission, onward)   Start     Dose/Rate Route Frequency Ordered Stop   10/29/19 0000  cephALEXin (KEFLEX) 500 MG capsule     500 mg Oral 4 times daily 10/29/19 0942 11/05/19 2359     Indication(s): None identified  Post-operative Assessment:  Post-procedure Vital Signs:  Pulse/HCG Rate: 9477 Temp: (!) 97.2 F (36.2 C) Resp: 16 BP: (!) 133/95 SpO2: 97 %  Complications: No immediate post-treatment complications observed by team, or reported by patient.  Note: The patient tolerated the entire procedure well. A repeat set of vitals were taken after the procedure and the patient was kept under observation following institutional policy, for this type of procedure. Post-procedural neurological assessment was performed, showing return to baseline, prior to discharge. The patient was provided with post-procedure discharge instructions, including a section on how to identify potential problems. Should any problems arise concerning this procedure, the patient was given instructions to immediately contact us, at any time, without hesitation. In any case, we plan to contact the patient by telephone for a follow-up status report regarding this interventional procedure.  Comments:  No additional relevant  information.  Plan of Care  Orders:  Orders Placed This Encounter  Procedures  . Fluoro (C-Arm) (<60 min) (No Report)    Intraoperative interpretation by procedural physician at [place].    Standing Status:   Standing    Number  of Occurrences:   1    Order Specific Question:   Reason for exam:    Answer:   Assistance in needle guidance and placement for procedures requiring needle placement in or near specific anatomical locations not easily accessible without such assistance.   Medications administered: We administered lidocaine, fentaNYL, midazolam, and ropivacaine (PF) 2 mg/mL (0.2%).  See the medical record for exact dosing, route, and time of administration.  DETAILED AVS instructions provided.  Follow-up plan:   Return for Keep sch. appt: next week for lead pull.      s/p L GN RFA on 7/20-helpful and TPI 7/20; status post left hip injection in greater trochanteric bursa injection with orthopedics [facility]) not helpful.  History of L5-S1 laminectomies, status post caudal ESI as well as cervical TPI on 05/28/2019.  PRN left knee genicular radiofrequency ablation.  Repeat on 07/18/2019. Right Genicular RFA on 08/13/2019. MEDTRONIC SCS trial 10/29/19.       Recent Visits Date Type Provider Dept  10/01/19 Procedure visit Gillis Santa, MD [facility]  09/03/19 Procedure visit Gillis Santa, MD [facility]  08/29/19 Office Visit Gillis Santa, MD [facility]  08/13/19 Procedure visit Gillis Santa, MD [facility]  Showing recent visits within past 90 days and meeting all other requirements   Today's Visits Date Type Provider Dept  10/29/19 Procedure visit Gillis Santa, MD [facility]  Showing today's visits and meeting all other requirements   Future Appointments Date Type Provider Dept  11/05/19 Appointment Gillis Santa, MD [facility]  Showing future appointments within next 90 days and meeting  all other requirements   Disposition: Discharge home  Discharge (Date  Time): 10/29/2019; 1007 hrs.   Primary Care Physician: Aletha Halim PA-C Location: [facility] Note by: Gillis Santa, MD Date: 10/29/2019; Time: 11:04 AM

## 2019-10-29 NOTE — Progress Notes (Signed)
Safety precautions to be maintained throughout the outpatient stay will include: orient to surroundings, keep bed in low position, maintain call bell within reach at all times, provide assistance with transfer out of bed and ambulation.  

## 2019-10-29 NOTE — Patient Instructions (Signed)
Today we did the following -We have done a Spinal Cord Stimulator Trial with MEDTRONIC  -As long as the leads are in place, do not bathe or shower. You may sponge bathe.  -While the lead is in place, please limit the bending, lifting, or twisting because the lead can move.  -The things we want to see is if your pain improves (and by what percentage), if you can do more activity (don't overdo it), and if you can use less of your "as needed" medicine.   -It is VERY important that you pick up the antibiotics we prescribed, Keflex, on your way home from the trial and take them as prescribed(4 times a day), starting today, for as long as the lead is in place.  -The Spina Cord Stimulator Representative will be in contact with you while the lead is in place to make sure the trial goes as well as possible.  -Please contact us with any questions or concerns at any time during the trial.   -If you start running a fever over 100 degrees, have severe back pain, or new pain running down the legs, or drainage coming from the lead site, contact us immediately and/or go to the emergency room.  -Please do not restart any sort of medication that can thin your blood such as Aspirin, ibuprofen, motrin, aleve, plavix, coumadin, etc. If you aren't sure, call and ask.  -We will have you return on Monday 11/05/2019 to have the lead removed. If this is successful, at that point we can go over the details about the permanent implant that will be done with Neurosurgery.

## 2019-10-30 ENCOUNTER — Telehealth: Payer: Self-pay

## 2019-10-30 NOTE — Telephone Encounter (Signed)
Denies any needs at this time. She does say that she is a little sore. Instructed her to use ice or heat. Informed patient that someone will keep in touch with her. Instructed to call if needed.

## 2019-10-30 NOTE — Telephone Encounter (Signed)
Spoke with patient.  Afebrile, minimal drainage on dressing, taking antibiotic as prescribed and significant decrease in pain noted.  Dr Holley Raring notified.

## 2019-10-31 ENCOUNTER — Encounter: Payer: Self-pay | Admitting: Student in an Organized Health Care Education/Training Program

## 2019-10-31 NOTE — Progress Notes (Signed)
Spoke with patient today, doing well overall. Taking Keflex QID, site appears clean, small amount of heme. Rates pain relief as 80%.

## 2019-11-01 ENCOUNTER — Telehealth: Payer: Self-pay

## 2019-11-01 NOTE — Telephone Encounter (Signed)
Post stim trial phone call.  Denies fever or increased drainage.  States she is doing great and walked all the way down her driveway.

## 2019-11-05 ENCOUNTER — Other Ambulatory Visit: Payer: Self-pay

## 2019-11-05 ENCOUNTER — Encounter: Payer: Self-pay | Admitting: Student in an Organized Health Care Education/Training Program

## 2019-11-05 ENCOUNTER — Ambulatory Visit
Admission: RE | Admit: 2019-11-05 | Discharge: 2019-11-05 | Disposition: A | Discharge status: Home / Self Care | Payer: Medicare HMO | Source: Ambulatory Visit | Attending: Student in an Organized Health Care Education/Training Program | Admitting: Student in an Organized Health Care Education/Training Program

## 2019-11-05 ENCOUNTER — Ambulatory Visit (HOSPITAL_BASED_OUTPATIENT_CLINIC_OR_DEPARTMENT_OTHER): Payer: Medicare HMO | Admitting: Student in an Organized Health Care Education/Training Program

## 2019-11-05 VITALS — BP 128/91 | HR 84 | Resp 16 | Ht 65.0 in | Wt 167.0 lb

## 2019-11-05 DIAGNOSIS — M961 Postlaminectomy syndrome, not elsewhere classified: Secondary | ICD-10-CM | POA: Insufficient documentation

## 2019-11-05 DIAGNOSIS — G8929 Other chronic pain: Secondary | ICD-10-CM | POA: Diagnosis not present

## 2019-11-05 DIAGNOSIS — M5416 Radiculopathy, lumbar region: Secondary | ICD-10-CM | POA: Diagnosis not present

## 2019-11-05 NOTE — Progress Notes (Signed)
Safety precautions to be maintained throughout the outpatient stay will include: orient to surroundings, keep bed in low position, maintain call bell within reach at all times, provide assistance with transfer out of bed and ambulation.  

## 2019-11-05 NOTE — Progress Notes (Signed)
Patient: Abigail [name]  Service Category: E/M  Provider: Gillis Santa, MD  DOB: 01-09-1966  DOS: 11/05/2019  Referring Provider: Amie Critchley  MRN: [id number]  Setting: Ambulatory outpatient  PCP: Aletha Halim., PA-C  Type: Established Patient  Specialty: Interventional Pain Management    Location: Office  Delivery: Face-to-face     Primary Reason(s) for Visit: Evaluation of chronic illnesses with exacerbation, or progression (Level of risk: moderate) CC: Back Pain (lower)  HPI  Abigail [name] is a 54 y.o. year old, female patient, who comes today for a follow-up evaluation. She has Intervertebral disc disorder with myelopathy, lumbar region; Spinal stenosis, lumbar; Spinal stenosis, lumbar region, with neurogenic claudication; Chronic radicular lumbar pain; Recurrent herniation of lumbar disc; Herniated lumbar intervertebral disc; TIA (transient ischemic attack); Hypertension; Decreased sensation of foot; Cervical radiculopathy; Adrenal insufficiency ([place]); Depression; Benign essential hypertension; Carpal tunnel syndrome, bilateral upper limbs; Chronic bilateral low back pain without sciatica; Chronic daily headache; Enlarged pituitary gland ([place]); Headache disorder; Insomnia; Lightheadedness; Mixed incontinence; Neck pain, chronic; Nicotine use disorder; Osteoarthritis of multiple joints; Pain of left hip joint; Pure hypercholesterolemia; Sleeping difficulty; Snoring; Weakness; Chronic pain of right knee; Chronic pain syndrome; Long term prescription benzodiazepine use; Opiate use; Pharmacologic therapy; Disorder of skeletal system; Problems influencing health status; Marijuana use; Primary osteoarthritis of right knee; Postlaminectomy syndrome, lumbar (Bilateral L5/S1 [place]); Myofascial pain; and Primary osteoarthritis of left knee on their problem list. Abigail [name] was last seen on 10/29/2019. Her primarily concern today is the Back Pain (lower)  Pain Assessment: Location: Right, Left  Back Radiating: both hips and legs Onset: More than a month ago Duration: Chronic pain Quality: Aching, Throbbing, Constant Severity: 4 /10 (subjective, self-reported pain score)  Note: Reported level is compatible with observation.                         When using our objective Pain Scale, levels between 6 and 10/10 are said to belong in an emergency room, as it progressively worsens from a 6/10, described as severely limiting, requiring emergency care not usually available at an outpatient pain management facility. At a 6/10 level, communication becomes difficult and requires great effort. Assistance to reach the emergency department may be required. Facial flushing and profuse sweating along with potentially dangerous increases in heart rate and blood pressure will be evident. Effect on ADL:   Timing: Constant Modifying factors: nothing BP: (!) 128/91  HR: 84  Further details on both, my assessment(s), as well as the proposed treatment plan, please see below.  Patient follows up today for removal of her SCS trial leads.  Patient endorses approximately 90% pain relief in regards to her low back and leg pain during the duration of her spinal cord stimulator trial.  She also endorses improvement in her functional status as she states that she was able to perform activities of daily living such as cooking, cleaning, doing household chores with much less pain and with greater ease.  She would like to proceed with permanent implant.  We will remove SCS trial leads today under fluoroscopy and referred to neurosurgery for permanent spinal cord stimulator implant.  Laboratory Chemistry Profile   Screening Lab Results  Component Value Date   [place] NEGATIVE 04/25/2019   COVIDSOURCE NASOPHARYNGEAL 03/16/2019   STAPHAUREUS NEGATIVE 10/06/2018   MRSAPCR NEGATIVE 10/06/2018    Inflammation (CRP: Acute Phase) (ESR: Chronic Phase) Lab Results  Component Value Date   CRP 1 12/26/2018  ESRSEDRATE 11 12/26/2018                         Rheumatology Lab Results  Component Value Date   RF <10 08/29/2013   ANA NEG 08/29/2013                        Renal Lab Results  Component Value Date   BUN 12 12/26/2018   CREATININE 0.87 12/26/2018   BCR 14 12/26/2018   GFRAA 90 12/26/2018   GFRNONAA 78 12/26/2018                             Hepatic Lab Results  Component Value Date   AST 13 12/26/2018   ALT 7 06/09/2014   ALBUMIN 4.4 12/26/2018   ALKPHOS 59 12/26/2018   LIPASE 91 03/21/2013                        Electrolytes Lab Results  Component Value Date   NA 139 12/26/2018   K 5.0 12/26/2018   CL 99 12/26/2018   CALCIUM 9.3 12/26/2018   MG 2.1 12/26/2018                        Neuropathy Lab Results  Component Value Date   VITAMINB12 393 12/26/2018                        CNS No results found for: COLORCSF, APPEARCSF, RBCCOUNTCSF, WBCCSF, POLYSCSF, LYMPHSCSF, EOSCSF, PROTEINCSF, GLUCCSF, JCVIRUS, CSFOLI, IGGCSF, LABACHR, ACETBL                      Bone Lab Results  Component Value Date   25OHVITD1 48 12/26/2018   25OHVITD2 2.7 12/26/2018   25OHVITD3 45 12/26/2018                         Coagulation Lab Results  Component Value Date   INR 0.96 10/06/2018   LABPROT 12.7 10/06/2018   APTT 35 10/06/2018   PLT 291 10/06/2018                        Cardiovascular Lab Results  Component Value Date   CKTOTAL 102 03/21/2013   TROPONINI <0.03 02/01/2015   HGB 14.7 10/06/2018   HCT 45.6 10/06/2018                         ID Lab Results  Component Value Date   SARSCOV2NAA NEGATIVE 04/25/2019   STAPHAUREUS NEGATIVE 10/06/2018   MRSAPCR NEGATIVE 10/06/2018   MICROTEXT  03/23/2013       COMMENT                   NEGATIVE-CLOS.DIFFICILE TOXIN NOT DETECTED BY PCR   ANTIBIOTIC                                                        Cancer No results found for: CEA, CA125, LABCA2  Endocrine Lab Results  Component  Value Date   TSH 2.317 09/08/2017                        Note: Lab results reviewed.  Imaging Review  Cervical Imaging: Cervical MR wo contrast:  Results for orders placed during the hospital encounter of 01/24/15  MR Cervical Spine Wo Contrast   Narrative CLINICAL DATA:  54 year old female with widespread pain with tingling and numbness. Right greater than left cervical neck pain. No known injury. Initial encounter.  EXAM: MRI CERVICAL SPINE WITHOUT CONTRAST  TECHNIQUE: Multiplanar, multisequence MR imaging of the cervical spine was performed. No intravenous contrast was administered.  COMPARISON:  Cervical spine radiographs 06/06/2014. Brain MRI 06/03/2014. [facility] 08/04/2013. Cervical spine MRI  FINDINGS: Chronic straightening of cervical lordosis. Chronic mild anterolisthesis of C2 on C3 is stable since 2014. Mild anterolisthesis of C3 on C4, and also trace anterolisthesis of C4 on C5 and C6 on C7 also appears stable. At the same time however there is mild increased STIR signal in the chronically degenerated right C3-C4 facets (series 4, image 3) and associated fluid in that facet joint. This appears to be degenerative in nature. See additional details below.  No other acute osseous abnormality. Cervicomedullary junction is within normal limits. Spinal cord signal is within normal limits at all visualized levels.  Negative paraspinal soft tissues.  C2-C3: Chronic moderate facet hypertrophy on the right. No significant stenosis.  C3-C4: Chronic anterolisthesis with right eccentric disc bulge. A chronic moderate facet hypertrophy greater on the right. New right facet joint fluid. No significant spinal stenosis. Mild right C4 foraminal stenosis has not significantly changed.  C4-C5: Chronic right eccentric disc bulge and up to moderate facet hypertrophy. Mild uncovertebral hypertrophy on the right. No significant stenosis.  C5-C6:  Minimal disc bulge and left uncovertebral hypertrophy. No stenosis.  C6-C7: Mild to moderate facet hypertrophy greater on the right. No stenosis.  C7-T1:  Mild facet hypertrophy.  No stenosis.  No upper thoracic spinal or foraminal stenosis.  IMPRESSION: 1. Dominant finding in the cervical spine is multilevel chronic mild spondylolisthesis with associated facet arthropathy at each affected level. 2. Mild inflammation of the chronically degenerated right C3-C4 facet is most compatible with an acute exacerbation of the chronic facet joint arthritis. 3. Comparatively mild cervical disc degeneration and no cervical spinal stenosis.   Electronically Signed   By: Genevie Ann M.D.   On: 01/24/2015 22:25    Cervical DG 2-3 views:  Results for orders placed during the hospital encounter of 10/18/18  DG Cervical Spine 2 or 3 views   Narrative CLINICAL DATA:  Cervical spine fusion.  EXAM: CERVICAL SPINE - 2-3 VIEW  COMPARISON:  10/18/2018.  FINDINGS: C2 through C5 anterior interbody fusion. Hardware intact. Anatomic alignment. No acute bony abnormality. Diffuse degenerative change. Biapical pleural thickening consistent scarring.  IMPRESSION: Through C5 anterior interbody fusion.  Hardware intact.   Electronically Signed   By: Marcello Moores  Register   On: 10/19/2018 07:27    Results for orders placed during the hospital encounter of 06/06/14  DG Cervical Spine Complete   Narrative CLINICAL DATA:  Right-sided neck pain radiating to the shoulder arm for several weeks. Syncope with fall tonight. Increasing neck pain.  EXAM: CERVICAL SPINE  4+ VIEWS  COMPARISON:  MRI cervical spine 08/04/2013  FINDINGS: Slight anterior subluxation of C3 on C4 was present on previous MRI. Otherwise normal alignment of the cervical spine and  facet joints. Degenerative changes throughout the facet joints. C1-2 articulation appears intact. Intervertebral disc space heights are preserved.  No vertebral compression deformities. No prevertebral soft tissue swelling. No focal bone lesion or bone destruction.  IMPRESSION: Slight anterior subluxation of C3 on C4 has been present on previous studies and is likely degenerative. Degenerative changes throughout the facet joints. No acute changes demonstrated.   Electronically Signed   By: Lucienne Capers M.D.   On: 06/06/2014 23:51     Lumbar MR wo contrast:  Results for orders placed during the hospital encounter of 12/10/18  MR LUMBAR SPINE WO CONTRAST   Narrative CLINICAL DATA:  Progressively worsening chronic low back pain. Prior lumbar surgeries.  EXAM: MRI LUMBAR SPINE WITHOUT CONTRAST  TECHNIQUE: Multiplanar, multisequence MR imaging of the lumbar spine was performed. No intravenous contrast was administered.  COMPARISON:  09/24/2013  FINDINGS: Segmentation:  Standard.  Alignment:  Unchanged grade 1 retrolisthesis of L5 on S1.  Vertebrae: No fracture, suspicious osseous lesion, or significant marrow edema. Progressive chronic degenerative marrow changes at L5-S1. Small superior endplate Schmorl's nodes at T12, L1, and L3, unchanged. Small Tarlov cysts at S2 and S3.  Conus medullaris and cauda equina: Conus extends to the L1-2 level. Conus and cauda equina appear normal.  Paraspinal and other soft tissues: Unchanged 1 cm T2 hyperintense lesion in the left kidney, likely a cyst. Postoperative changes in the posterior lower lumbar soft tissues. No fluid collection.  Disc levels:  Disc desiccation throughout most of the lumbar and lower thoracic spine, greatest at L4-5 and L5-S1 where there is mild and severe disc space narrowing, respectively.  T12-L1 through L3-4: Negative.  L4-5: Mild disc bulging and mild facet and ligamentum flavum hypertrophy, at most slightly progressed from the prior study. No significant stenosis.  L5-S1: Bilateral laminectomies, new on the left since the prior study.  Epidural fibrosis is most notable about the right S1 nerve root. No recurrent disc herniation or spinal or lateral recess stenosis is identified. Disc bulging, endplate spurring, severe disc space height loss, and moderate facet hypertrophy result in moderate bilateral neural foraminal stenosis, not significantly changed.  IMPRESSION: 1. Postsurgical changes and progressive disc degeneration at L5-S1 without evidence of recurrent disc herniation or spinal stenosis. Unchanged moderate bilateral neural foraminal stenosis. 2. Mild disc bulging and posterior element hypertrophy at L4-5 without stenosis.   Electronically Signed   By: Logan Bores M.D.   On: 12/10/2018 13:45    Lumbar DG 2-3 views:  Results for orders placed during the hospital encounter of 09/27/13  DG Lumbar Spine 2-3 Views   Narrative CLINICAL DATA:  Lumbar disc protrusion.  EXAM: LUMBAR SPINE - 2-3 VIEW  COMPARISON:  MRI dated 09/24/2013  FINDINGS: There is marked narrowing of the L5-S1 disc space. The remainder of the lumbar spine is normal.  IMPRESSION: Degenerative disc disease at L5-S1. Vertebra are labeled for intraoperative comparison.   Electronically Signed   By: Rozetta Nunnery M.D.   On: 09/27/2013 14:14    Hip-R MR wo contrast:  Results for orders placed during the hospital encounter of 06/20/18  MR HIP RIGHT WO CONTRAST   Narrative CLINICAL DATA:  Chronic bilateral hip pain.  EXAM: MR OF THE LEFT HIP WITHOUT CONTRAST  MR OF THE RIGHT HIP WITHOUT CONTRAST  TECHNIQUE: Multiplanar, multisequence MR imaging was performed. No intravenous contrast was administered.  COMPARISON:  CT abdomen pelvis dated March 21, 2013.  FINDINGS: Bones: There is no evidence of acute fracture, dislocation or  avascular necrosis. The visualized bony pelvis appears normal. The visualized sacroiliac joints and symphysis pubis appear normal. Degenerative disc disease at L5-S1.  Articular cartilage and  labrum  Articular cartilage: No focal chondral defect or subchondral signal abnormality identified.  Labrum: Partial tear of the left anterior superior labrum. The right labrum is intact.  Joint or bursal effusion  Joint effusion: No significant hip joint effusion.  Bursae: No focal periarticular fluid collection.  Muscles and tendons  Muscles and tendons: The visualized gluteus, hamstring and iliopsoas tendons appear normal. No muscle edema or atrophy.  Other findings  Miscellaneous: Prior hysterectomy. The visualized internal pelvic contents otherwise appear unremarkable.  IMPRESSION: 1. Partial tear of the left anterior superior labrum. 2. No acute osseous abnormality or significant degenerative changes.   Electronically Signed   By: Titus Dubin M.D.   On: 06/20/2018 16:25    Hip-L MR wo contrast:  Results for orders placed during the hospital encounter of 06/20/18  MR HIP LEFT WO CONTRAST   Narrative CLINICAL DATA:  Chronic bilateral hip pain.  EXAM: MR OF THE LEFT HIP WITHOUT CONTRAST  MR OF THE RIGHT HIP WITHOUT CONTRAST  TECHNIQUE: Multiplanar, multisequence MR imaging was performed. No intravenous contrast was administered.  COMPARISON:  CT abdomen pelvis dated March 21, 2013.  FINDINGS: Bones: There is no evidence of acute fracture, dislocation or avascular necrosis. The visualized bony pelvis appears normal. The visualized sacroiliac joints and symphysis pubis appear normal. Degenerative disc disease at L5-S1.  Articular cartilage and labrum  Articular cartilage: No focal chondral defect or subchondral signal abnormality identified.  Labrum: Partial tear of the left anterior superior labrum. The right labrum is intact.  Joint or bursal effusion  Joint effusion: No significant hip joint effusion.  Bursae: No focal periarticular fluid collection.  Muscles and tendons  Muscles and tendons: The visualized gluteus, hamstring and  iliopsoas tendons appear normal. No muscle edema or atrophy.  Other findings  Miscellaneous: Prior hysterectomy. The visualized internal pelvic contents otherwise appear unremarkable.  IMPRESSION: 1. Partial tear of the left anterior superior labrum. 2. No acute osseous abnormality or significant degenerative changes.   Electronically Signed   By: Titus Dubin M.D.   On: 06/20/2018 16:25    Knee-L MR w contrast:  Results for orders placed during the hospital encounter of 12/07/18  MR KNEE LEFT WO CONTRAST   Narrative CLINICAL DATA:  Diffuse left knee pain and locking for 1 year.  EXAM: MRI OF THE LEFT KNEE WITHOUT CONTRAST  TECHNIQUE: Multiplanar, multisequence MR imaging of the knee was performed. No intravenous contrast was administered.  COMPARISON:  None.  FINDINGS: MENISCI  Medial meniscus:  Intact.  Lateral meniscus:  Intact.  LIGAMENTS  Cruciates:  Intact.  Collaterals:  Intact.  CARTILAGE  Patellofemoral: Fissuring and irregularity are seen about both the medial and lateral patellar facets, worst in the upper pole of the medial facet and mid to lower pole of the lateral facet.  Medial:  Normal.  Lateral:  Normal.  Joint:  Trace amount of joint fluid.  Popliteal Fossa:  No Baker's cyst.  Extensor Mechanism:  Intact.  Bones: Scattered small subchondral cysts and mild edema are seen in the patella at sites of cartilage loss.  Other: None.  IMPRESSION: Negative for meniscal or ligament tear.  Moderate appearing patellofemoral osteoarthritis.   Electronically Signed   By: Inge Rise M.D.   On: 12/07/2018 15:15     Complexity Note: Imaging results reviewed. Results shared with Ms.  Aline Brochure, using Layman's terms.                         Meds   Current Outpatient Medications:  .  acetaminophen (TYLENOL) 500 MG tablet, Take 500 mg by mouth every 6 (six) hours as needed., Disp: , Rfl:  .  amLODipine-olmesartan (AZOR) 5-20 MG  per tablet, Take 1 tablet by mouth daily before breakfast., Disp: , Rfl:  .  atorvastatin (LIPITOR) 20 MG tablet, Take 20 mg by mouth daily., Disp: , Rfl:  .  celecoxib (CELEBREX) 200 MG capsule, Take 200 mg by mouth 2 (two) times daily., Disp: , Rfl:  .  cephALEXin (KEFLEX) 500 MG capsule, Take 1 capsule (500 mg total) by mouth 4 (four) times daily for 7 days., Disp: 28 capsule, Rfl: 0 .  Cholecalciferol (VITAMIN D-1000 MAX ST) 25 MCG (1000 UT) tablet, Take 1,000 Units by mouth daily., Disp: , Rfl:  .  clonazePAM (KLONOPIN) 0.5 MG tablet, TAKE 1/2 TABLET BY MOUTH 2 3 TIMES PER DAY AS NEEDED, Disp: , Rfl:  .  DULoxetine (CYMBALTA) 60 MG capsule, Take 60 mg by mouth 2 (two) times daily., Disp: , Rfl:  .  fluticasone (FLONASE) 50 MCG/ACT nasal spray, Place 2 sprays into both nostrils 2 (two) times daily., Disp: , Rfl:  .  Fremanezumab-vfrm (AJOVY) 225 MG/1.5ML SOSY, Inject 225 mg into the skin every 28 (twenty-eight) days., Disp: , Rfl:  .  hydrocortisone (CORTEF) 5 MG tablet, Take 5-10 mg by mouth See admin instructions. Take 63m by mouth in the morning and 540min the evening, Disp: , Rfl:  .  methocarbamol (ROBAXIN) 500 MG tablet, Take 2 tablets (1,000 mg total) by mouth 4 (four) times daily., Disp: 90 tablet, Rfl: 0 .  metoprolol succinate (TOPROL-XL) 50 MG 24 hr tablet, Take 50 mg by mouth daily. Take with or immediately following a meal., Disp: , Rfl:  .  nortriptyline (PAMELOR) 50 MG capsule, TAKE 2 CAPSULES (100 MG TOTAL) BY MOUTH NIGHTLY, Disp: , Rfl:  .  predniSONE (DELTASONE) 5 MG tablet, Take 5 mg by mouth daily., Disp: , Rfl:  .  rizatriptan (MAXALT-MLT) 10 MG disintegrating tablet, Take 10 mg by mouth as needed. Taking for headaches, Disp: , Rfl:  .  tiZANidine (ZANAFLEX) 2 MG tablet, Take by mouth 3 (three) times daily., Disp: , Rfl:   ROS  Constitutional: Denies any fever or chills Gastrointestinal: No reported hemesis, hematochezia, vomiting, or acute GI distress Musculoskeletal:  Denies any acute onset joint swelling, redness, loss of ROM, or weakness Neurological: No reported episodes of acute onset apraxia, aphasia, dysarthria, agnosia, amnesia, paralysis, loss of coordination, or loss of consciousness  Allergies  Ms. HaKocourekas No Known Allergies.  PFSoldierDrug: Ms. HaGuthridgereports current drug use. Frequency: 7.00 times per week. Drug: Marijuana. Alcohol:  reports current alcohol use. Tobacco:  reports that she has been smoking cigarettes. She has a 10.50 pack-year smoking history. She has never used smokeless tobacco. Medical:  has a past medical history [place], Complication of anesthesia, Headache(784.0), Hypertension, and Syncope. Surgical: Ms. HaMundohas a past surgical history that includes left foot bone spur surgery (10 yrs ago); Abdominal hysterectomy (14 yrs ago); Lumbar laminectomy/decompression microdiscectomy (07/13/2012); Lumbar laminectomy/decompression microdiscectomy (Right, 03/07/2013); Hemi-microdiscectomy lumbar laminectomy level 1 (N/A, 09/28/2013); Breast biopsy (Left, 04/10/2015); Cholecystectomy; Eye surgery; Back surgery; and Anterior cervical decompression/discectomy fusion 4 level (N/A, 10/18/2018). Family: family history is not  on file.  Constitutional Exam  General appearance: Well nourished, well developed, and well hydrated. In no apparent acute distress Vitals:   11/05/19 1113  BP: (!) 128/91  Pulse: 84  Resp: 16  SpO2: 97%  Weight: 167 lb (75.8 kg)  Height: 5' 5"  (1.651 m)   BMI Assessment: Estimated body mass index is 27.79 kg/m as calculated from the following:   Height as of this encounter: 5' 5"  (1.651 m).   Weight as of this encounter: 167 lb (75.8 kg).  BMI interpretation table: BMI level Category Range association with higher incidence of chronic pain  <18 kg/m2 Underweight   18.5-24.9 kg/m2 Ideal body weight   25-29.9 kg/m2 Overweight Increased incidence by 20%  30-34.9  kg/m2 Obese (Class I) Increased incidence by 68%  35-39.9 kg/m2 Severe obesity (Class II) Increased incidence by 136%  >40 kg/m2 Extreme obesity (Class III) Increased incidence by 254%   Patient's current BMI Ideal Body weight  Body mass index is 27.79 kg/m. Ideal body weight: 57 kg (125 lb 10.6 oz) Adjusted ideal body weight: 64.5 kg (142 lb 3.2 oz)   BMI Readings from Last 4 Encounters:  11/05/19 27.79 kg/m  10/29/19 27.79 kg/m  10/01/19 28.67 kg/m  09/03/19 27.79 kg/m   Wt Readings from Last 4 Encounters:  11/05/19 167 lb (75.8 kg)  10/29/19 167 lb (75.8 kg)  10/01/19 167 lb (75.8 kg)  09/03/19 167 lb (75.8 kg)    Psych/Mental status: Alert, oriented x 3 (person, place, & time)       Eyes: PERLA Respiratory: No evidence of acute respiratory distress  Thoracic Spine Area Exam  Skin & Axial Inspection: No masses, redness, or swelling Alignment: Symmetrical Functional ROM: Unrestricted ROM Stability: No instability detected Muscle Tone/Strength: Functionally intact. No obvious neuro-muscular anomalies detected. Sensory (Neurological): Unimpaired Muscle strength & Tone: No palpable anomalies  Lumbar Spine Area Exam  Skin & Axial Inspection: Spinal cord stimulator trial leads removed under live fluoroscopy Alignment: Symmetrical Functional ROM: Improved after treatment       Stability: No instability detected Muscle Tone/Strength: Functionally intact. No obvious neuro-muscular anomalies detected. Sensory (Neurological): Improved Palpation: No palpable anomalies       Provocative Tests: Hyperextension/rotation test: Improved after treatment       Lumbar quadrant test (Kemp's test): Improved after treatment       Lateral bending test: Improved after treatment        Gait & Posture Assessment  Ambulation: Improved during the course of her trial Gait: Improved in the course of her trial Posture: Improved in the course of her trial   Lower Extremity Exam    Side:  Right lower extremity  Side: Left lower extremity  Stability: No instability observed          Stability: No instability observed          Skin & Extremity Inspection: Skin color, temperature, and hair growth are WNL. No peripheral edema or cyanosis. No masses, redness, swelling, asymmetry, or associated skin lesions. No contractures.  Skin & Extremity Inspection: Skin color, temperature, and hair growth are WNL. No peripheral edema or cyanosis. No masses, redness, swelling, asymmetry, or associated skin lesions. No contractures.  Functional ROM: Improved after treatment during SCS trial                  Functional ROM: Improved after treatment after SCS trial                  Muscle Tone/Strength:  Functionally intact. No obvious neuro-muscular anomalies detected.  Muscle Tone/Strength: Functionally intact. No obvious neuro-muscular anomalies detected.  Sensory (Neurological): Improved        Sensory (Neurological): Improved        DTR: Patellar: deferred today Achilles: deferred today Plantar: deferred today  DTR: Patellar: deferred today Achilles: deferred today Plantar: deferred today  Palpation: No palpable anomalies  Palpation: No palpable anomalies   Assessment   Status Diagnosis  Responding Responding Responding 1. Postlaminectomy syndrome, lumbar (Bilateral L5/S1 L[place])   2. Lumbar radiculopathy   3. Chronic radicular lumbar pain      Updated Problems: Problem  Chronic Radicular Lumbar Pain    Plan of Care   -Patient's spinal cord stimulator trial leads were removed under live fluoroscopy.  Interestingly her right lead had migrated into her cervical region.  No issues with removing trial leads.   -Referral to neurosurgery to discuss permanent neurostimulator implant as patient had successful spinal cord stimulator trial with greater than 90% pain relief and improvement in functional status during this last week.  Orders:  Orders Placed This Encounter  Procedures  . DG  [facility] C-ARM 1-60 MIN NO REPORT    Intraoperative interpretation by procedural physician at [facility].    Standing Status:   Standing    Number of Occurrences:   1    Order Specific Question:   Reason for exam:    Answer:   Assistance in needle guidance and placement for procedures requiring needle placement in or near specific anatomical locations not easily accessible without such assistance.  . Ambulatory referral to Neurosurgery    Referral Priority:   Routine    Referral Type:   Surgical    Referral Reason:   Specialty Services Required    Referred to Provider:   Meade Maw, MD    Requested Specialty:   Neurosurgery    Number of Visits Requested:   1    Imaging Orders     [facility] C-ARM 1-60 MIN NO REPORT  Referral Orders     Ambulatory referral to Neurosurgery Planned follow-up:   Return if symptoms worsen or fail to improve.     s/p L GN RFA on 7/20-helpful and TPI 7/20; status post left hip injection in greater trochanteric bursa injection with orthopedics [facility]) not helpful.  History of L5-S1 laminectomies, status post caudal ESI as well as cervical TPI on 05/28/2019.  PRN left knee genicular radiofrequency ablation.  Repeat on 07/18/2019. Right Genicular RFA on 08/13/2019. [employer]TR[employer] SCS trial 10/29/19 successful, refer to neurosurgery for permanent implant.        Recent Visits Date Type Provider Dept  10/29/19 Procedure visit Gillis Santa, MD [facility]  10/01/19 Procedure visit Gillis Santa, MD [facility]  09/03/19 Procedure visit Gillis Santa, MD [facility]  08/29/19 Office Visit Gillis Santa, MD [facility]  08/13/19 Procedure visit Gillis Santa, MD [facility]  Showing recent visits within past 90 days and meeting all other requirements   Today's Visits Date Type Provider Dept  11/05/19 Procedure visit Gillis Santa, MD [facility]  Showing today's visits and  meeting all other requirements   Future Appointments No visits were found meeting these conditions.  Showing future appointments within next 90 days and meeting all other requirements   Primary Care Physician: Aletha Halim., PA-C Location: [facility] Note by: Gillis Santa, MD Date: 11/05/2019; Time: 1:04 PM  Note: This  dictation was prepared with Dragon dictation. Any transcriptional errors that may result from this process are unintentional.

## 2019-11-06 DIAGNOSIS — F329 Major depressive disorder, single episode, unspecified: Secondary | ICD-10-CM | POA: Diagnosis not present

## 2019-11-06 DIAGNOSIS — R519 Headache, unspecified: Secondary | ICD-10-CM | POA: Diagnosis not present

## 2019-11-06 DIAGNOSIS — R251 Tremor, unspecified: Secondary | ICD-10-CM | POA: Diagnosis not present

## 2019-11-06 DIAGNOSIS — G5600 Carpal tunnel syndrome, unspecified upper limb: Secondary | ICD-10-CM | POA: Diagnosis not present

## 2019-11-06 DIAGNOSIS — F419 Anxiety disorder, unspecified: Secondary | ICD-10-CM | POA: Diagnosis not present

## 2019-11-06 DIAGNOSIS — E2740 Unspecified adrenocortical insufficiency: Secondary | ICD-10-CM | POA: Diagnosis not present

## 2019-11-06 DIAGNOSIS — R42 Dizziness and giddiness: Secondary | ICD-10-CM | POA: Diagnosis not present

## 2019-11-06 DIAGNOSIS — G5603 Carpal tunnel syndrome, bilateral upper limbs: Secondary | ICD-10-CM | POA: Diagnosis not present

## 2019-11-08 ENCOUNTER — Ambulatory Visit: Payer: Medicare Other | Admitting: Psychology

## 2019-11-08 ENCOUNTER — Other Ambulatory Visit: Payer: Self-pay | Admitting: Neurosurgery

## 2019-11-08 DIAGNOSIS — M5412 Radiculopathy, cervical region: Secondary | ICD-10-CM | POA: Diagnosis not present

## 2019-11-08 DIAGNOSIS — G894 Chronic pain syndrome: Secondary | ICD-10-CM | POA: Diagnosis not present

## 2019-11-08 DIAGNOSIS — G8929 Other chronic pain: Secondary | ICD-10-CM | POA: Diagnosis not present

## 2019-11-08 DIAGNOSIS — M546 Pain in thoracic spine: Secondary | ICD-10-CM | POA: Diagnosis not present

## 2019-11-09 ENCOUNTER — Encounter: Payer: Self-pay | Admitting: Student in an Organized Health Care Education/Training Program

## 2019-11-09 ENCOUNTER — Other Ambulatory Visit: Payer: Self-pay | Admitting: Neurosurgery

## 2019-11-09 DIAGNOSIS — G8929 Other chronic pain: Secondary | ICD-10-CM

## 2019-11-09 DIAGNOSIS — M5412 Radiculopathy, cervical region: Secondary | ICD-10-CM

## 2019-11-19 ENCOUNTER — Other Ambulatory Visit: Payer: Self-pay

## 2019-11-19 ENCOUNTER — Ambulatory Visit
Admission: RE | Admit: 2019-11-19 | Discharge: 2019-11-19 | Disposition: A | Discharge status: Home / Self Care | Payer: Medicare HMO | Source: Ambulatory Visit | Attending: Neurosurgery | Admitting: Neurosurgery

## 2019-11-19 DIAGNOSIS — M546 Pain in thoracic spine: Secondary | ICD-10-CM | POA: Insufficient documentation

## 2019-11-19 DIAGNOSIS — M5412 Radiculopathy, cervical region: Secondary | ICD-10-CM | POA: Insufficient documentation

## 2019-11-19 DIAGNOSIS — M50221 Other cervical disc displacement at C4-C5 level: Secondary | ICD-10-CM | POA: Diagnosis not present

## 2019-11-19 DIAGNOSIS — G8929 Other chronic pain: Secondary | ICD-10-CM

## 2019-11-19 DIAGNOSIS — M5124 Other intervertebral disc displacement, thoracic region: Secondary | ICD-10-CM | POA: Diagnosis not present

## 2019-11-22 ENCOUNTER — Encounter
Admission: RE | Admit: 2019-11-22 | Discharge: 2019-11-22 | Disposition: A | Discharge status: Home / Self Care | Payer: Medicare HMO | Source: Ambulatory Visit | Attending: Neurosurgery | Admitting: Neurosurgery

## 2019-11-22 ENCOUNTER — Other Ambulatory Visit: Payer: Self-pay

## 2019-11-22 DIAGNOSIS — Z01818 Encounter for other preprocedural examination: Secondary | ICD-10-CM | POA: Insufficient documentation

## 2019-11-22 DIAGNOSIS — I10 Essential (primary) hypertension: Secondary | ICD-10-CM | POA: Insufficient documentation

## 2019-11-22 DIAGNOSIS — I440 Atrioventricular block, first degree: Secondary | ICD-10-CM | POA: Insufficient documentation

## 2019-11-22 HISTORY — DX: Other specified postprocedural states: R11.2

## 2019-11-22 HISTORY — DX: Family history of other specified conditions: Z84.89

## 2019-11-22 HISTORY — DX: Other specified postprocedural states: Z98.890

## 2019-11-22 NOTE — Patient Instructions (Addendum)
Your procedure is scheduled on: 11-28-19 [facility] Report to Same Day Surgery 2nd floor medical mall [facility] Entrance-take elevator on left to 2nd floor.  Check in with surgery information desk.) To find out your arrival time please call [phone] between 1PM - 3PM on 11-27-19 TUESDAY  Remember: Instructions that are not followed completely may result in serious medical risk, up to and including death, or upon the discretion of your surgeon and anesthesiologist your surgery may need to be rescheduled.    _x___ 1. Do not eat food after midnight the night before your procedure. NO GUM OR CANDY AFTER MIDNIGHT. You may drink clear liquids up to 2 hours before you are scheduled to arrive at the hospital for your procedure.  Do not drink clear liquids within 2 hours of your scheduled arrival to the hospital.  Clear liquids include  --Water or Apple juice without pulp  --Gatorade  --Black Coffee or Clear Tea (No milk, no creamers, do not add anything to the coffee or Tea   ____Ensure clear carbohydrate drink on the way to the hospital for bariatric patients  ____Ensure clear carbohydrate drink 3 hours before surgery.    __x__ 2. No Alcohol for 24 hours before or after surgery.   __x__3. No Smoking or e-cigarettes for 24 prior to surgery.  Do not use any chewable tobacco products for at least 6 hour prior to surgery   ____  4. Bring all medications with you on the day of surgery if instructed.    __x__ 5. Notify your doctor if there is any change in your medical condition     (cold, fever, infections).    x___6. On the morning of surgery brush your teeth with toothpaste and water.  You may rinse your mouth with mouth wash if you wish.  Do not swallow any toothpaste or mouthwash.   Do not wear jewelry, make-up, hairpins, clips or nail polish.  Do not wear lotions, powders, or perfumes.   Do not shave 48 hours prior to surgery. Men may shave face and neck.  Do not bring valuables to  the hospital.    [facility] is not responsible for any belongings or valuables.               Contacts, dentures or bridgework may not be worn into surgery.  Leave your suitcase in the car. After surgery it may be brought to your room.  For patients admitted to the hospital, discharge time is determined by your treatment team.  _  Patients discharged the day of surgery will not be allowed to drive home.  You will need someone to drive you home and stay with you the night of your procedure.    Please read over the following fact sheets that you were given:   [facility] Preparing for Surgery and or MRSA Information   _x___ TAKE THE FOLLOWING MEDICATION THE MORNING OF SURGERY WITH A SMALL SIP OF WATER. These include:  1. LIPITOR (ATORVASTATIN)  2. KLONOPIN (CLONAZEPAM)  3.CYMBALTA (DULOXETINE)  4. PREDNISONE  5. LYRICA (PREGABALIN)  6. PROPRANOLOL (INDERAL)  7. ZANAFLEX (TIZANIDINE)  ____Fleets enema or Magnesium Citrate as directed.   _x___ Use CHG Soap or sage wipes as directed on instruction sheet   ____ Use inhalers on the day of surgery and bring to hospital day of surgery  ____ Stop Metformin and Janumet 2 days prior to surgery.    ____ Take 1/2 of usual insulin dose the night before surgery and  none on the morning surgery.   ____ Follow recommendations from Cardiologist, Pulmonologist or PCP regarding stopping Aspirin, Coumadin, Plavix ,Eliquis, Effient, or Pradaxa, and Pletal.  X____Stop Anti-inflammatories such as Advil, Aleve, Ibuprofen, Motrin, Naproxen, Naprosyn, Goodies powders or aspirin products NOW-OK to take Tylenol and Celebrex.   _x___ Stop supplements until after surgery-PT HAD ALREADY RUN OUT OF BIOTIN-WILL NOT START BACK UNTIL AFTER HER SURGERY   ____ Bring C-Pap to the hospital.

## 2019-11-23 ENCOUNTER — Encounter
Admission: RE | Admit: 2019-11-23 | Discharge: 2019-11-23 | Disposition: A | Discharge status: Home / Self Care | Payer: Medicare HMO | Source: Ambulatory Visit | Attending: Neurosurgery | Admitting: Neurosurgery

## 2019-11-23 ENCOUNTER — Ambulatory Visit: Payer: 59 | Admitting: Psychology

## 2019-11-23 DIAGNOSIS — I10 Essential (primary) hypertension: Secondary | ICD-10-CM | POA: Diagnosis not present

## 2019-11-23 DIAGNOSIS — Z01818 Encounter for other preprocedural examination: Secondary | ICD-10-CM | POA: Diagnosis not present

## 2019-11-23 DIAGNOSIS — I440 Atrioventricular block, first degree: Secondary | ICD-10-CM | POA: Diagnosis not present

## 2019-11-23 LAB — URINALYSIS, ROUTINE W REFLEX MICROSCOPIC
Bilirubin Urine: NEGATIVE
Glucose, UA: NEGATIVE mg/dL
Ketones, ur: NEGATIVE mg/dL
Leukocytes,Ua: NEGATIVE
Nitrite: NEGATIVE
Protein, ur: NEGATIVE mg/dL
Specific Gravity, Urine: 1.016 (ref 1.005–1.030)
pH: 5 (ref 5.0–8.0)

## 2019-11-23 LAB — BASIC METABOLIC PANEL
Anion gap: 4 — ABNORMAL LOW (ref 5–15)
BUN: 18 mg/dL (ref 6–20)
CO2: 28 mmol/L (ref 22–32)
Calcium: 8.9 mg/dL (ref 8.9–10.3)
Chloride: 105 mmol/L (ref 98–111)
Creatinine, Ser: 1.01 mg/dL — ABNORMAL HIGH (ref 0.44–1.00)
GFR calc Af Amer: >60 mL/min (ref >60)
GFR calc non Af Amer: >60 mL/min (ref >60)
Glucose, Bld: 94 mg/dL (ref 70–99)
Potassium: 4.1 mmol/L (ref 3.5–5.1)
Sodium: 137 mmol/L (ref 135–145)

## 2019-11-23 LAB — CBC
HCT: 47.6 % — ABNORMAL HIGH (ref 36.0–46.0)
Hemoglobin: 15.5 g/dL — ABNORMAL HIGH (ref 12.0–15.0)
MCH: 30.1 pg (ref 26.0–34.0)
MCHC: 32.6 g/dL (ref 30.0–36.0)
MCV: 92.4 fL (ref 80.0–100.0)
Platelets: 249 10*3/uL (ref 150–400)
RBC: 5.15 MIL/uL — ABNORMAL HIGH (ref 3.87–5.11)
RDW: 12.9 % (ref 11.5–15.5)
WBC: 13.3 10*3/uL — ABNORMAL HIGH (ref 4.0–10.5)
nRBC: 0 % (ref 0.0–0.2)

## 2019-11-23 LAB — TYPE AND SCREEN
ABO/RH(D): O POS
Antibody Screen: NEGATIVE

## 2019-11-23 LAB — SURGICAL PCR SCREEN
MRSA, PCR: NEGATIVE
Staphylococcus aureus: POSITIVE — AB

## 2019-11-23 LAB — PROTIME-INR
INR: 0.9 (ref 0.8–1.2)
Prothrombin Time: 12.2 seconds (ref 11.4–15.2)

## 2019-11-23 LAB — APTT: aPTT: 34 seconds (ref 24–36)

## 2019-11-23 NOTE — Pre-Procedure Instructions (Signed)
Pre-Admit Testing Provider Notification Note  Provider Notified: Dr. Izora Ribas  Notification Mode: Fax  Reason: Abnormal labs.  Response: Fax confirmation received.  Additional Information: Placed on chart. Noted on Pre-Admit Worksheet.  Signed: Beulah Gandy, RN

## 2019-11-23 NOTE — Pre-Procedure Instructions (Signed)
ABNORMAL URINALYSIS AND ABNORMAL PCR SCREEN(POSITIVE FOR STAPH) FAXED TO DR Izora Ribas OFFICE

## 2019-11-26 ENCOUNTER — Other Ambulatory Visit: Payer: Self-pay

## 2019-11-26 ENCOUNTER — Other Ambulatory Visit
Admission: RE | Admit: 2019-11-26 | Discharge: 2019-11-26 | Disposition: A | Discharge status: Home / Self Care | Payer: Medicare HMO | Source: Ambulatory Visit | Attending: Neurosurgery | Admitting: Neurosurgery

## 2019-11-26 DIAGNOSIS — Z20822 Contact with and (suspected) exposure to covid-19: Secondary | ICD-10-CM | POA: Insufficient documentation

## 2019-11-26 DIAGNOSIS — Z01812 Encounter for preprocedural laboratory examination: Secondary | ICD-10-CM | POA: Diagnosis not present

## 2019-11-27 LAB — SARS CORONAVIRUS 2 (TAT 6-24 HRS): SARS Coronavirus 2: NEGATIVE

## 2019-11-28 ENCOUNTER — Encounter: Payer: Self-pay | Admitting: Neurosurgery

## 2019-11-28 ENCOUNTER — Ambulatory Visit: Payer: Medicare HMO

## 2019-11-28 ENCOUNTER — Ambulatory Visit
Admission: RE | Admit: 2019-11-28 | Discharge: 2019-11-28 | Disposition: A | Discharge status: Home / Self Care | Payer: Medicare HMO | Attending: Neurosurgery | Admitting: Neurosurgery

## 2019-11-28 ENCOUNTER — Other Ambulatory Visit: Payer: Self-pay

## 2019-11-28 ENCOUNTER — Encounter
Admission: RE | Disposition: A | Discharge status: Home / Self Care | Payer: Self-pay | Source: Home / Self Care | Attending: Neurosurgery

## 2019-11-28 DIAGNOSIS — Z419 Encounter for procedure for purposes other than remedying health state, unspecified: Secondary | ICD-10-CM

## 2019-11-28 DIAGNOSIS — M546 Pain in thoracic spine: Secondary | ICD-10-CM | POA: Diagnosis not present

## 2019-11-28 DIAGNOSIS — F329 Major depressive disorder, single episode, unspecified: Secondary | ICD-10-CM | POA: Insufficient documentation

## 2019-11-28 DIAGNOSIS — F17210 Nicotine dependence, cigarettes, uncomplicated: Secondary | ICD-10-CM | POA: Diagnosis not present

## 2019-11-28 DIAGNOSIS — M797 Fibromyalgia: Secondary | ICD-10-CM | POA: Insufficient documentation

## 2019-11-28 DIAGNOSIS — M961 Postlaminectomy syndrome, not elsewhere classified: Secondary | ICD-10-CM

## 2019-11-28 DIAGNOSIS — Z8673 Personal history of transient ischemic attack (TIA), and cerebral infarction without residual deficits: Secondary | ICD-10-CM | POA: Insufficient documentation

## 2019-11-28 DIAGNOSIS — I10 Essential (primary) hypertension: Secondary | ICD-10-CM | POA: Insufficient documentation

## 2019-11-28 DIAGNOSIS — G894 Chronic pain syndrome: Secondary | ICD-10-CM | POA: Insufficient documentation

## 2019-11-28 DIAGNOSIS — Z969 Presence of functional implant, unspecified: Secondary | ICD-10-CM | POA: Diagnosis not present

## 2019-11-28 DIAGNOSIS — G43909 Migraine, unspecified, not intractable, without status migrainosus: Secondary | ICD-10-CM | POA: Diagnosis not present

## 2019-11-28 DIAGNOSIS — E785 Hyperlipidemia, unspecified: Secondary | ICD-10-CM | POA: Diagnosis not present

## 2019-11-28 HISTORY — PX: SPINAL CORD STIMULATOR INSERTION: SHX5378

## 2019-11-28 LAB — URINE DRUG SCREEN, QUALITATIVE (ARMC ONLY)
Amphetamines, Ur Screen: NOT DETECTED
Barbiturates, Ur Screen: NOT DETECTED
Benzodiazepine, Ur Scrn: NOT DETECTED
Cannabinoid 50 Ng, Ur ~~LOC~~: POSITIVE — AB
Cocaine Metabolite,Ur ~~LOC~~: NOT DETECTED
MDMA (Ecstasy)Ur Screen: NOT DETECTED
Methadone Scn, Ur: NOT DETECTED
Opiate, Ur Screen: NOT DETECTED
Phencyclidine (PCP) Ur S: NOT DETECTED
Tricyclic, Ur Screen: POSITIVE — AB

## 2019-11-28 SURGERY — INSERTION, SPINAL CORD STIMULATOR, LUMBAR
Anesthesia: General

## 2019-11-28 MED ORDER — VANCOMYCIN HCL 1250 MG/250ML IV SOLN
1250.0000 mg | INTRAVENOUS | Status: AC
  Administered 2019-11-28: 1250 mg via INTRAVENOUS
  Filled 2019-11-28: qty 250

## 2019-11-28 MED ORDER — BUPIVACAINE LIPOSOME 1.3 % IJ SUSP
INTRAMUSCULAR | Status: AC
  Filled 2019-11-28: qty 20

## 2019-11-28 MED ORDER — ACETAMINOPHEN 10 MG/ML IV SOLN: 1000.0000 mg | Freq: Once | INTRAVENOUS | Status: DC | PRN

## 2019-11-28 MED ORDER — ACETAMINOPHEN 10 MG/ML IV SOLN
INTRAVENOUS | Status: AC
  Filled 2019-11-28: qty 100

## 2019-11-28 MED ORDER — PHENYLEPHRINE HCL (PRESSORS) 10 MG/ML IV SOLN
INTRAVENOUS | Status: DC | PRN
  Administered 2019-11-28 (×3): 100 ug via INTRAVENOUS

## 2019-11-28 MED ORDER — ACETAMINOPHEN 10 MG/ML IV SOLN
INTRAVENOUS | Status: DC | PRN
  Administered 2019-11-28: 1000 mg via INTRAVENOUS

## 2019-11-28 MED ORDER — BUPIVACAINE-EPINEPHRINE (PF) 0.5% -1:200000 IJ SOLN
INTRAMUSCULAR | Status: DC | PRN
  Administered 2019-11-28: 10 mL

## 2019-11-28 MED ORDER — BUPIVACAINE HCL (PF) 0.5 % IJ SOLN
INTRAMUSCULAR | Status: AC
  Filled 2019-11-28: qty 30

## 2019-11-28 MED ORDER — SODIUM CHLORIDE 0.9 % IR SOLN
Status: DC | PRN
  Administered 2019-11-28: 1000 mL

## 2019-11-28 MED ORDER — CEFAZOLIN SODIUM-DEXTROSE 2-4 GM/100ML-% IV SOLN: 2.0000 g | Freq: Once | INTRAVENOUS | Status: DC

## 2019-11-28 MED ORDER — PROPOFOL 500 MG/50ML IV EMUL
INTRAVENOUS | Status: DC | PRN
  Administered 2019-11-28: 150 ug/kg/min via INTRAVENOUS

## 2019-11-28 MED ORDER — FAMOTIDINE 20 MG PO TABS
ORAL_TABLET | ORAL | Status: AC
  Filled 2019-11-28: qty 1

## 2019-11-28 MED ORDER — METHYLPREDNISOLONE SODIUM SUCC 125 MG IJ SOLR
100.0000 mg | INTRAMUSCULAR | Status: AC
  Administered 2019-11-28: 125 mg via INTRAVENOUS

## 2019-11-28 MED ORDER — FENTANYL CITRATE (PF) 100 MCG/2ML IJ SOLN: 25.0000 ug | INTRAMUSCULAR | Status: DC | PRN

## 2019-11-28 MED ORDER — ROCURONIUM BROMIDE 100 MG/10ML IV SOLN
INTRAVENOUS | Status: DC | PRN
  Administered 2019-11-28: 15 mg via INTRAVENOUS

## 2019-11-28 MED ORDER — LIDOCAINE HCL (CARDIAC) PF 100 MG/5ML IV SOSY
PREFILLED_SYRINGE | INTRAVENOUS | Status: DC | PRN
  Administered 2019-11-28: 60 mg via INTRAVENOUS

## 2019-11-28 MED ORDER — THROMBIN 5000 UNITS EX SOLR
CUTANEOUS | Status: AC
  Filled 2019-11-28: qty 5000

## 2019-11-28 MED ORDER — THROMBIN 5000 UNITS EX SOLR
CUTANEOUS | Status: DC | PRN
  Administered 2019-11-28: 5000 [IU] via TOPICAL

## 2019-11-28 MED ORDER — ONDANSETRON HCL 4 MG/2ML IJ SOLN: 4.0000 mg | Freq: Once | INTRAMUSCULAR | Status: DC | PRN

## 2019-11-28 MED ORDER — ONDANSETRON HCL 4 MG/2ML IJ SOLN
INTRAMUSCULAR | Status: DC | PRN
  Administered 2019-11-28: 4 mg via INTRAVENOUS

## 2019-11-28 MED ORDER — METHYLPREDNISOLONE SODIUM SUCC 125 MG IJ SOLR
INTRAMUSCULAR | Status: AC
  Filled 2019-11-28: qty 2

## 2019-11-28 MED ORDER — FENTANYL CITRATE (PF) 100 MCG/2ML IJ SOLN
INTRAMUSCULAR | Status: AC
  Filled 2019-11-28: qty 2

## 2019-11-28 MED ORDER — TIZANIDINE HCL 2 MG PO TABS: 2.0000 mg | ORAL_TABLET | Freq: Four times a day (QID) | ORAL | 0 refills | Status: DC | PRN

## 2019-11-28 MED ORDER — CEFAZOLIN SODIUM-DEXTROSE 2-4 GM/100ML-% IV SOLN
INTRAVENOUS | Status: AC
  Filled 2019-11-28: qty 100

## 2019-11-28 MED ORDER — EPHEDRINE SULFATE 50 MG/ML IJ SOLN
INTRAMUSCULAR | Status: DC | PRN
  Administered 2019-11-28: 10 mg via INTRAVENOUS
  Administered 2019-11-28: 5 mg via INTRAVENOUS

## 2019-11-28 MED ORDER — OXYCODONE HCL 5 MG PO TABS: 5.0000 mg | ORAL_TABLET | ORAL | 0 refills | Status: DC | PRN

## 2019-11-28 MED ORDER — FAMOTIDINE 20 MG PO TABS
20.0000 mg | ORAL_TABLET | Freq: Once | ORAL | Status: AC
  Administered 2019-11-28: 20 mg via ORAL

## 2019-11-28 MED ORDER — MIDAZOLAM HCL 2 MG/2ML IJ SOLN
INTRAMUSCULAR | Status: AC
  Filled 2019-11-28: qty 2

## 2019-11-28 MED ORDER — LACTATED RINGERS IV SOLN: INTRAVENOUS | Status: DC | PRN

## 2019-11-28 MED ORDER — SODIUM CHLORIDE 0.9 % IV SOLN
INTRAVENOUS | Status: DC | PRN
  Administered 2019-11-28: 40 mL

## 2019-11-28 MED ORDER — MIDAZOLAM HCL 2 MG/2ML IJ SOLN
INTRAMUSCULAR | Status: DC | PRN
  Administered 2019-11-28: 2 mg via INTRAVENOUS

## 2019-11-28 MED ORDER — FENTANYL CITRATE (PF) 100 MCG/2ML IJ SOLN
INTRAMUSCULAR | Status: DC | PRN
  Administered 2019-11-28 (×3): 50 ug via INTRAVENOUS

## 2019-11-28 MED ORDER — BUPIVACAINE HCL 0.5 % IJ SOLN
INTRAMUSCULAR | Status: DC | PRN
  Administered 2019-11-28: 20 mL

## 2019-11-28 MED ORDER — PROPOFOL 10 MG/ML IV BOLUS
INTRAVENOUS | Status: DC | PRN
  Administered 2019-11-28: 150 mg via INTRAVENOUS

## 2019-11-28 MED ORDER — SUCCINYLCHOLINE CHLORIDE 20 MG/ML IJ SOLN
INTRAMUSCULAR | Status: DC | PRN
  Administered 2019-11-28: 100 mg via INTRAVENOUS

## 2019-11-28 MED ORDER — PROPOFOL 500 MG/50ML IV EMUL
INTRAVENOUS | Status: AC
  Filled 2019-11-28: qty 50

## 2019-11-28 MED ORDER — BACITRACIN 50000 UNITS IM SOLR
INTRAMUSCULAR | Status: AC
  Filled 2019-11-28: qty 1

## 2019-11-28 MED ORDER — CEFAZOLIN SODIUM-DEXTROSE 2-3 GM-%(50ML) IV SOLR
INTRAVENOUS | Status: DC | PRN
  Administered 2019-11-28: 2 g via INTRAVENOUS

## 2019-11-28 MED ORDER — BUPIVACAINE-EPINEPHRINE (PF) 0.5% -1:200000 IJ SOLN
INTRAMUSCULAR | Status: AC
  Filled 2019-11-28: qty 30

## 2019-11-28 MED ORDER — OXYCODONE HCL 5 MG/5ML PO SOLN: 5.0000 mg | Freq: Once | ORAL | Status: DC | PRN

## 2019-11-28 MED ORDER — SUGAMMADEX SODIUM 200 MG/2ML IV SOLN
INTRAVENOUS | Status: DC | PRN
  Administered 2019-11-28: 200 mg via INTRAVENOUS

## 2019-11-28 MED ORDER — LACTATED RINGERS IV SOLN
1000.0000 mL | Freq: Once | INTRAVENOUS | Status: AC
  Administered 2019-11-28: 1000 mL via INTRAVENOUS

## 2019-11-28 MED ORDER — OXYCODONE HCL 5 MG PO TABS: 5.0000 mg | ORAL_TABLET | Freq: Once | ORAL | Status: DC | PRN

## 2019-11-28 SURGICAL SUPPLY — 56 items
BUR NEURO DRILL SOFT 3.0X3.8M (BURR) ×2 IMPLANT
CANISTER SUCT 1200ML W/VALVE (MISCELLANEOUS) ×4 IMPLANT
CHLORAPREP W/TINT 26 (MISCELLANEOUS) ×4 IMPLANT
COUNTER NEEDLE 20/40 LG (NEEDLE) ×2 IMPLANT
COVER LIGHT HANDLE STERIS (MISCELLANEOUS) ×4 IMPLANT
COVER WAND RF STERILE (DRAPES) ×2 IMPLANT
CUP MEDICINE 2OZ PLAST GRAD ST (MISCELLANEOUS) ×2 IMPLANT
DERMABOND ADVANCED (GAUZE/BANDAGES/DRESSINGS) ×2
DERMABOND ADVANCED .7 DNX12 (GAUZE/BANDAGES/DRESSINGS) ×2 IMPLANT
DEVICE IMPLANT NEUROSTIMULATOR (Neuro Prosthesis/Implant) ×1 IMPLANT
DRAPE C-ARM 42X72 X-RAY (DRAPES) ×2 IMPLANT
DRAPE LAPAROTOMY 100X77 ABD (DRAPES) ×2 IMPLANT
DRAPE MICROSCOPE SPINE 48X150 (DRAPES) IMPLANT
DRAPE SURG 17X11 SM STRL (DRAPES) ×12 IMPLANT
ELECT CAUTERY BLADE TIP 2.5 (TIP) ×2
ELECT REM PT RETURN 9FT ADLT (ELECTROSURGICAL) ×2
ELECTRODE CAUTERY BLDE TIP 2.5 (TIP) ×1 IMPLANT
ELECTRODE REM PT RTRN 9FT ADLT (ELECTROSURGICAL) ×1 IMPLANT
ENVELOPE ABSORB ANTIBACTERIAL (Neuro Prosthesis/Implant) ×2 IMPLANT
FEE INTRAOP MONITOR IMPULS NCS (MISCELLANEOUS) IMPLANT
GLOVE BIOGEL PI IND STRL 7.0 (GLOVE) ×1 IMPLANT
GLOVE BIOGEL PI INDICATOR 7.0 (GLOVE) ×1
GLOVE SURG SYN 7.0 (GLOVE) ×4 IMPLANT
GLOVE SURG SYN 7.0 PF PI (GLOVE) ×2 IMPLANT
GLOVE SURG SYN 8.5  E (GLOVE) ×3
GLOVE SURG SYN 8.5 E (GLOVE) ×3 IMPLANT
GLOVE SURG SYN 8.5 PF PI (GLOVE) ×3 IMPLANT
GOWN SRG XL LVL 3 NONREINFORCE (GOWNS) ×1 IMPLANT
GOWN STRL NON-REIN TWL XL LVL3 (GOWNS) ×1
GOWN STRL REUS W/TWL MED LVL3 (GOWN DISPOSABLE) ×2 IMPLANT
GRADUATE 1200CC STRL 31836 (MISCELLANEOUS) ×2 IMPLANT
INTRAOP MONITOR FEE IMPULS NCS (MISCELLANEOUS)
INTRAOP MONITOR FEE IMPULSE (MISCELLANEOUS)
KIT TURNOVER KIT A (KITS) ×2 IMPLANT
MARKER SKIN DUAL TIP RULER LAB (MISCELLANEOUS) ×2 IMPLANT
NDL SAFETY ECLIPSE 18X1.5 (NEEDLE) ×1 IMPLANT
NEEDLE HYPO 18GX1.5 SHARP (NEEDLE) ×1
NEEDLE HYPO 22GX1.5 SAFETY (NEEDLE) ×2 IMPLANT
NS IRRIG 1000ML POUR BTL (IV SOLUTION) ×2 IMPLANT
PACK LAMINECTOMY NEURO (CUSTOM PROCEDURE TRAY) ×2 IMPLANT
POUCH TYRX ANTIBAC NEURO MED (Neuro Prosthesis/Implant) IMPLANT
RECHARGER INTELLIS (NEUROSURGERY SUPPLIES) ×1 IMPLANT
REPROGRAMMER INTELLIS (NEUROSURGERY SUPPLIES) ×1 IMPLANT
SPOGE SURGIFLO 8M (HEMOSTASIS)
SPONGE SURGIFLO 8M (HEMOSTASIS) IMPLANT
STAPLER SKIN PROX 35W (STAPLE) IMPLANT
STIMULATOR CORD SURESCAN MRI (Stimulator) ×1 IMPLANT
SUT SILK 2 0SH CR/8 30 (SUTURE) ×2 IMPLANT
SUT V-LOC 90 ABS DVC 3-0 CL (SUTURE) ×4 IMPLANT
SUT VIC AB 0 CT1 18XCR BRD 8 (SUTURE) ×1 IMPLANT
SUT VIC AB 0 CT1 8-18 (SUTURE) ×2
SUT VIC AB 2-0 CT1 18 (SUTURE) ×3 IMPLANT
SYR 10ML LL (SYRINGE) ×2 IMPLANT
SYR 30ML LL (SYRINGE) ×4 IMPLANT
TOWEL OR 17X26 4PK STRL BLUE (TOWEL DISPOSABLE) ×6 IMPLANT
TUBING CONNECTING 10 (TUBING) ×2 IMPLANT

## 2019-11-28 NOTE — Discharge Summary (Signed)
Procedure: Spinal cord stimulator placement Procedure date: 11/28/2019 Diagnosis: Chronic pain syndrome   History: Abigail [name] is s/p spinal cord stimulator placement for chronic pain syndrome POD: Tolerated procedure well. Evaluated in post op recovery still disoriented from anesthesia but able to answer questions and obey commands.  Complains of back pain.  No new lower extremity pain/numbness/tingling.  Physical Exam: Vitals:   11/28/19 1141  BP: 114/86  Pulse: 80  Resp: 18  Temp: 97.8 F (36.6 C)  SpO2: 97%   Strength: 5/5 throughout lower extremities s Sensation: intact and symmetric throughout lower extremities  Data:  Recent Labs  Lab 11/23/19 0919  NA 137  K 4.1  CL 105  CO2 28  BUN 18  CREATININE 1.01*  GLUCOSE 94  CALCIUM 8.9   No results for input(s): AST, ALT, ALKPHOS in the last 168 hours.  Invalid input(s): TBILI   Recent Labs  Lab 11/23/19 0919  WBC 13.3*  HGB 15.5*  HCT 47.6*  PLT 249   Recent Labs  Lab 11/23/19 0919  APTT 34  INR 0.9         Other tests/results: No imaging reviewed  Assessment/Plan:  Abigail [name] is POD0 s/p spinal cord stimulator placement for chronic pain syndrome.  Postop pain control with Tylenol, muscle relaxer, and medication as needed.  She is scheduled to follow-up in clinic in approximately 2 weeks to monitor progress.   Marin Olp PA-C Department of Neurosurgery

## 2019-11-28 NOTE — Transfer of Care (Signed)
Immediate Anesthesia Transfer of Care Note  Patient: Abigail [name]  Procedure(s) Performed: LUMBAR SPINAL CORD STIMULATOR INSERTION (N/A )  Patient Location: PACU  Anesthesia Type:General  Level of Consciousness: awake  Airway & Oxygen Therapy: Patient Spontanous Breathing and Patient connected to face mask oxygen  Post-op Assessment: Report given to RN and Post -op Vital signs reviewed and stable  Post vital signs: Reviewed  Last Vitals:  Vitals Value Taken Time  BP 98/69 11/28/19 1447  Temp    Pulse 79 11/28/19 1449  Resp 19 11/28/19 1449  SpO2 99 % 11/28/19 1449  Vitals shown include unvalidated device data.  Last Pain:  Vitals:   11/28/19 1141  TempSrc: Oral  PainSc: 5          Complications: No apparent anesthesia complications

## 2019-11-28 NOTE — Discharge Instructions (Addendum)
AMBULATORY SURGERY  DISCHARGE INSTRUCTIONS   1) The drugs that you were given will stay in your system until tomorrow so for the next 24 hours you should not:  A) Drive an automobile B) Make any legal decisions C) Drink any alcoholic beverage   2) You may resume regular meals tomorrow.  Today it is better to start with liquids and gradually work up to solid foods.  You may eat anything you prefer, but it is better to start with liquids, then soup and crackers, and gradually work up to solid foods.   3) Please notify your doctor immediately if you have any unusual bleeding, trouble breathing, redness and pain at the surgery site, drainage, fever, or pain not relieved by medication. 4)   5) Your post-operative visit with Dr.                                     is: Date:                        Time:    Please call to schedule your post-operative visit.  6) Additional Instructions:      NEUROSURGERY DISCHARGE INSTRUCTIONS  The following are instructions to help in your recovery once you have been discharged from the hospital. Even if you feel well, it is important that you follow these activity guidelines.  What to do after you leave the hospital:  Recommended diet:  Increase protein intake to promote wound healing. You may return to your usual diet. However, you may experience discomfort when swallowing in the first month after your surgery. This is normal. You may find that softer foods are more comfortable for you to swallow. Be sure to stay hydrated.   Recommended activity: No bending, lifting, or twisting ("BLT"). Avoid lifting objects heavier than 10 pounds (gallon milk jug). Where possible, avoid household activities that involve lifting, bending, reaching, pushing, or pulling such as laundry, vacuuming, grocery shopping, and childcare. Try to arrange for help from friends and family for these activities while you heal.   Increase physical activity slowly as tolerated.  Taking short walks is encouraged, but avoid strenuous exercise. Do not jog, run, bicycle, lift weights, or participate in any other exercises unless specifically allowed by your doctor.   You should not drive until cleared by your doctor.   Until released by your doctor, you should not return to work or school. You should rest at home and let your body heal.   You may shower the day after your surgery. After showering, lightly dab your incision dry. Do not take a tub bath or go swimming until approved by your doctor at your follow-up appointment.   If you smoke, we strongly recommend that you quit. Smoking has been proven to interfere with normal bone healing and will dramatically reduce the success rate of your surgery. Please contact [facility] (800-QUIT-NOW) and use the resources at www.[facility].com for assistance in stopping smoking.   Medications  Do not restart Aspirin until seven days after surgery  * Do not take anti-inflammatory medications for 3 days after surgery (naproxen [Aleve], ibuprofen [Advil, Motrin], celecoxib [Celebrex], etc.).   You may restart home medications.   Wound Care Instructions  If you have a dressing on your incision, remove it two days after your surgery. Keep your incision area clean and dry.   If you have staples or stitches  on your incision, you should have a follow up scheduled for removal. If you do not have staples or stitches, you will have steri-strips (small pieces of surgical tape) or Dermabond glue. The steri-strips/glue should begin to peel away within about a week (it is fine if the steri-strips fall off before then). If the strips are still in place one week after your surgery, you may gently remove them.    Please Report any of the following: Should you experience any of the following, contact us immediately:   New numbness or weakness   Pain that is progressively getting worse, and is not relieved by your pain medication, muscle  relaxers, rest, and warm compresses   Bleeding, redness, swelling, pain, or drainage from surgical incision   Chills or flu-like symptoms   Fever greater than 101.0 F (38.3 C)   Inability to eat, drink fluids, or take medications   Problems with bowel or bladder functions   Difficulty breathing or shortness of breath   Warmth, tenderness, or swelling in your calf    Additional Follow up appointments During office hours (Monday-Friday 9 am to 5 pm), please call your physician at [phone] and ask for Berdine Addison.   After hours and weekends, please call [phone] and an answering service will put you in touch with either Dr. Lacinda Axon or Dr. Izora Ribas.   For a life-threatening emergency, call 911

## 2019-11-28 NOTE — H&P (Signed)
  I have reviewed and confirmed my history and physical from 11/20/2019 with no additions or changes. Plan for spinal cord stimulator placement.  Risks and benefits reviewed.  Heart sounds normal no MRG. Chest Clear to Auscultation Bilaterally.

## 2019-11-28 NOTE — Anesthesia Procedure Notes (Signed)
Procedure Name: Intubation Performed by: Harper Smoker, CRNA Pre-anesthesia Checklist: Patient identified, Patient being monitored, Timeout performed, Emergency Drugs available and Suction available Patient Re-evaluated:Patient Re-evaluated prior to induction Oxygen Delivery Method: Circle system utilized Preoxygenation: Pre-oxygenation with 100% oxygen Induction Type: IV induction Ventilation: Mask ventilation without difficulty Laryngoscope Size: 3 and McGraph Grade View: Grade I Tube type: Oral Tube size: 7.0 mm Number of attempts: 1 Airway Equipment and Method: Stylet and Video-laryngoscopy Placement Confirmation: ETT inserted through vocal cords under direct vision,  positive ETCO2 and breath sounds checked- equal and bilateral Secured at: 21 cm Tube secured with: Tape Dental Injury: Teeth and Oropharynx as per pre-operative assessment        

## 2019-11-28 NOTE — Anesthesia Preprocedure Evaluation (Signed)
Anesthesia Evaluation  Patient identified by MRN, date of birth, ID band Patient awake    Reviewed: Allergy & Precautions, NPO status , Patient's Chart, lab work & pertinent test results  History of Anesthesia Complications (+) PONVNegative for: history of anesthetic complications  Airway Mallampati: III  TM Distance: >3 FB Neck ROM: Full    Dental no notable dental hx. (+) Teeth Intact, Dental Advisory Given   Pulmonary neg sleep apnea, neg COPD, Current SmokerPatient did not abstain from smoking.,    Pulmonary exam normal breath sounds clear to auscultation       Cardiovascular Exercise Tolerance: Good METShypertension, Pt. on medications (-) CAD and (-) Past MI (-) dysrhythmias  Rhythm:Regular Rate:Normal - Systolic murmurs    Neuro/Psych  Headaches, PSYCHIATRIC DISORDERS Depression TIA   GI/Hepatic neg GERD  ,(+)     (-) substance abuse  ,   Endo/Other  neg diabetes  Renal/GU negative Renal ROS     Musculoskeletal  (+) Arthritis ,   Abdominal   Peds  Hematology   Anesthesia Other Findings Past Medical History: No date: Adrenal cortex insufficiency (HCC) No date: Arthritis     Comment:  arthritis-back, weakness in right leg No date: Complication of anesthesia     Comment:  migraine headache when come out of anesthesia No date: Family history of adverse reaction to anesthesia     Comment:  mom n/v No date: Headache(784.0)     Comment:  occ. migraines, not frequent No date: Hypertension No date: PONV (postoperative nausea and vomiting)     Comment:  states patch works No date: Syncope  Reproductive/Obstetrics                            Anesthesia Physical Anesthesia Plan  ASA: II  Anesthesia Plan: General   Post-op Pain Management:    Induction: Intravenous  PONV Risk Score and Plan: 4 or greater and Ondansetron, Dexamethasone, Propofol infusion, TIVA and  Midazolam  Airway Management Planned: Oral ETT  Additional Equipment: None  Intra-op Plan:   Post-operative Plan: Extubation in OR  Informed Consent: I have reviewed the patients History and Physical, chart, labs and discussed the procedure including the risks, benefits and alternatives for the proposed anesthesia with the patient or authorized representative who has indicated his/her understanding and acceptance.     Dental advisory given  Plan Discussed with: CRNA and Surgeon  Anesthesia Plan Comments: (Discussed risks of anesthesia with patient, including PONV, sore throat, lip/dental damage. Rare risks discussed as well, such as cardiorespiratory sequelae. Patient understands. Discussed increased risk of respiratory issues perioperatively given smoking history.)        Anesthesia Quick Evaluation

## 2019-11-28 NOTE — Op Note (Signed)
Indications: the patient is a 54 yo female who was diagnosed with chronic pain syndrome. The patient had a successful trial for spinal cord stimulation, so was consented for placement of a permanent device   Findings: successful placement of a [employer] spinal cord stimulator. IPG Model number [id number], Lead model number [id number]   Preoperative Diagnosis: Chronic pain syndrome Postoperative Diagnosis: same     EBL: 10 ml IVF: 700 ml Drains: none Disposition: Extubated and Stable to PACU Complications: none   No foley catheter was placed.     Preoperative Note:    Risks of surgery discussed in clinic.   Operative Note:      The patient was then brought from the preoperative center with intravenous access established.  The patient underwent general anesthesia and endotracheal tube intubation, then was rotated on the [facility] table where all pressure points were appropriately padded.  An incision was marked with flouroscopy at T9/10, and on the buttock. The skin was then thoroughly cleansed.  Perioperative antibiotic prophylaxis was administered.  Sterile prep and drapes were then applied and a timeout was then observed.     Once this was complete an incision was opened with the use of a #10 blade knife in the midline at the thoracic incision.  The paraspinus muscled were subperiosteally dissected until the laminae of T9 and T10 were visualized. Flouroscopy was used to confirm the level. A self-retaining retractor was placed.   The rongeur was used to remove the spinous process of T9.  The drill was used to thin the bone until the ligamentum flavum was visualized.  The ligamentum was then removed and the dura visualized. This was widened until approximately 12 mm of dura was exposed to allow for placement of the paddle lead.     The lead was then advanced to the T7/8 disc space at the top of the lead.  The lead was secured with a 2-0 silk suture. The lead was placed in a bacitracin soaked  sponge.   The incision on the right buttock was then opened and a pocket formed until it was large enough for the pulse generator.  The tunneler was used to connect between the pocket and the incision.  The lead was inserted into the tunneler and tunneled to the buttock.  The leads were attached to the IPG and impedances checked.  The leads were then tightened.  The IPG was then inserted into the pouch.   Both sites were irrigated.  The wounds were closed in layers with 0 and 2-0 vicryl.  The skin was approximated with monocryl. A sterile dressing was applied.     Patient was then rotated back to the preoperative bed awakened from anesthesia and taken to recovery all counts are correct in this case.   I performed the entire procedure with the assistance of Marin Olp PA as an Pensions consultant.     Meade Maw MD

## 2019-11-28 NOTE — Consult Note (Signed)
Pharmacy Antibiotic Note  Abigail [name] is a 54 y.o. female admitted on 11/28/2019  For planned surgical procedure.   Pharmacy has been consulted for Cefazolin and vancomycin pre- op dosing.  Plan: Cefazolin 2g IV 30-min pre-op already ordered. Agree with dose.   Will order Vancomycin 1250mg  IV (~15mg /kg) 60-min pre-op.   Recent Labs  Lab 11/23/19 0919  WBC 13.3*  CREATININE 1.01*    Estimated Creatinine Clearance: 67.8 mL/min (A) (by C-G formula based on SCr of 1.01 mg/dL (H)).    No Known Allergies   Thank you for allowing pharmacy to be a part of this patient's care.  Pernell Dupre, PharmD, BCPS Clinical Pharmacist 11/28/2019 11:42 AM

## 2019-11-29 NOTE — Anesthesia Postprocedure Evaluation (Signed)
Anesthesia Post Note  Patient: Abigail [name]  Procedure(s) Performed: LUMBAR SPINAL CORD STIMULATOR INSERTION (N/A )  Patient location during evaluation: PACU Anesthesia Type: General Level of consciousness: awake and alert Pain management: pain level controlled Vital Signs Assessment: post-procedure vital signs reviewed and stable Respiratory status: spontaneous breathing, nonlabored ventilation, respiratory function stable and patient connected to nasal cannula oxygen Cardiovascular status: blood pressure returned to baseline and stable Postop Assessment: no apparent nausea or vomiting Anesthetic complications: no     Last Vitals:  Vitals:   11/28/19 1549 11/28/19 1643  BP: 122/63 126/60  Pulse: 75 75  Resp: 18 20  Temp: 36.4 C   SpO2: 95% 95%    Last Pain:  Vitals:   11/28/19 1549  TempSrc: Temporal  PainSc: 0-No pain                 Arita Miss

## 2019-12-19 ENCOUNTER — Other Ambulatory Visit: Payer: Self-pay | Admitting: Orthopedic Surgery

## 2019-12-25 ENCOUNTER — Encounter
Admission: RE | Admit: 2019-12-25 | Discharge: 2019-12-25 | Disposition: A | Discharge status: Home / Self Care | Payer: Medicare HMO | Source: Ambulatory Visit | Attending: Orthopedic Surgery | Admitting: Orthopedic Surgery

## 2019-12-25 ENCOUNTER — Other Ambulatory Visit: Payer: Self-pay

## 2019-12-25 DIAGNOSIS — Z01818 Encounter for other preprocedural examination: Secondary | ICD-10-CM | POA: Insufficient documentation

## 2019-12-25 HISTORY — DX: Radiculopathy, lumbar region: M54.16

## 2019-12-25 HISTORY — DX: Unspecified asthma, uncomplicated: J45.909

## 2019-12-25 HISTORY — DX: Myalgia, other site: M79.18

## 2019-12-25 HISTORY — DX: Anxiety disorder, unspecified: F41.9

## 2019-12-25 NOTE — Patient Instructions (Addendum)
INSTRUCTIONS FOR SURGERY     Your surgery is scheduled for:   Thursday, MARCH 25TH     To find out your arrival time for the day of surgery,          please call [phone] between 1 pm and 3 pm on :  Wednesday, MARCH 24TH     When you arrive for surgery, report to the [facility].       Do NOT stop on the first floor to register.    REMEMBER: Instructions that are not followed completely may result in serious medical risk,  up to and including death, or upon the discretion of your surgeon and anesthesiologist,            your surgery may need to be rescheduled.  __X__ 1. Do not eat food after midnight the night before your procedure.                    No gum, candy, lozenger, tic tacs, tums or hard candies.                  ABSOLUTELY NOTHING SOLID IN YOUR MOUTH AFTER MIDNIGHT                    You may drink unlimited clear liquids up to 2 hours before you are scheduled to arrive for surgery.                   Do not drink anything within those 2 hours unless you need to take medicine, then take the                   smallest amount you need.  Clear liquids include:  water, apple juice without pulp,                   any flavor Gatorade, Black coffee, black tea.  Sugar may be added but no dairy/ honey /lemon.                        Broth and jello is not considered a clear liquid.  __x__  2. On the morning of surgery, please brush your teeth with toothpaste and water. You may rinse with                  mouthwash if you wish but DO NOT SWALLOW TOOTHPASTE OR MOUTHWASH  __X___3. NO alcohol for 24 hours before or after surgery.  __x___ 4.  Do NOT smoke or use e-cigarettes for 24 HOURS PRIOR TO SURGERY.                      DO NOT Use any chewable tobacco products for at least 6 hours prior to surgery.  __x___ 5. If you start any new medication after this appointment and prior to surgery, please        Bring it with you on the day of surgery.  ___x__ 6. Notify your doctor if there is any change in your medical condition, such as fever,  infection, vomitting, diarrhea or any open sores.  __x___ 7.  USE the CHG SOAP as instructed, the night before surgery and the day of surgery.                   Once you have washed with this soap, do NOT use any of the following: Powders, perfumes                    or lotions. Please do not wear make up, hairpins, clips or nail polish. You MAY wear deodorant.                   Men may shave their face and neck.  Women need to shave 48 hours prior to surgery.                   DO NOT wear ANY jewelry on the day of surgery. If there are rings that are too tight to                    remove easily, please address this prior to the surgery day. Piercings need to be removed.                                                                     NO METAL ON YOUR BODY.                    Do NOT bring any valuables.  If you came to Pre-Admit testing then you will not need license,                     insurance card or credit card.  If you will be staying overnight, please either leave your things in                     the car or have your family be responsible for these items.                     [facility] IS NOT RESPONSIBLE FOR BELONGINGS OR VALUABLES.  ___X__ 8. DO NOT wear contact lenses on surgery day.  You may not have dentures,                     Hearing aides, contacts or glasses in the operating room. These items can be                    Placed in the Recovery Room to receive immediately after surgery.  __x___ 9. IF YOU ARE SCHEDULED TO GO HOME ON THE SAME DAY, YOU MUST                   Have someone to drive you home and to stay with you  for the first 24 hours.                    Have an arrangement prior to arriving on surgery day.  ___x__ 10. Take the following medications on the morning of surgery with a sip of water:  1. KLONIPIN                     2. CYMBALTA                     3. PREDNISONE                     4. LYRICA                     5. LIPITOR                     6. INDERAL  __X___ 11.  Follow any instructions provided to you by your surgeon.                        Such as enema, clear liquid bowel prep                          ##PLEASE COMPLETE THE SURGICAL CARB DRINK BY 2 HOURS                                 PRIOR TO ARRIVAL TO [facility].##  __X__  12. STOP  ASPIRIN AS OF: Thursday, MARCH 18TH                       THIS INCLUDES BC POWDERS / GOODIES POWDER  __x___ 13. STOP Anti-inflammatories as of:  Thursday, MARCH 18TH                      This includes IBUPROFEN / MOTRIN / ADVIL / ALEVE/ NAPROXYN / CELEBREX                   YOU MAY TAKE TYLENOL ANY TIME PRIOR TO SURGERY.  _____ 14.  Stop supplements until after surgery.                     This includes:  BIOTIN                 You may continue taking Vitamin B12 / Vitamin D3 but do not take on the morning of surgery.  ___X___17.  Continue to take the following medications but do not take on the morning of surgery:                          VITAMIN D / AZOR   ______18. Wear clean and comfortable clothing to the hospital.                    Have something loose fitting for your leg.  PLEASE BRING DEVICE FOR YOUR STIMULATOR. Have stool softeners available for home use when taking pain medication. Bring phone number for your husband if he does not stay during surgery.

## 2019-12-28 NOTE — Pre-Procedure Instructions (Signed)
SECURE CHAT WITH DR Abigail [name]  Dr Abigail [name] this patient has HX of adrenal insufficiency. She had an anterior cervical discectomy and fusion 10/18/18 at which time she was given Hydrocortisone 100 mg IV. Do we need to have this available for her upcoming knee scope with Dr Rudene Christians on 3/35/21 or should we contact her PCP/Endocrinologist?  the hydrocortisone is fine. I took care of her for her spinal cord stim implant recently and she had this too at that time  So I should order it then  yes thanks

## 2020-01-01 ENCOUNTER — Other Ambulatory Visit
Admission: RE | Admit: 2020-01-01 | Discharge: 2020-01-01 | Disposition: A | Discharge status: Home / Self Care | Payer: Medicare HMO | Source: Ambulatory Visit | Attending: Orthopedic Surgery | Admitting: Orthopedic Surgery

## 2020-01-01 ENCOUNTER — Other Ambulatory Visit: Payer: Self-pay

## 2020-01-01 DIAGNOSIS — M810 Age-related osteoporosis without current pathological fracture: Secondary | ICD-10-CM | POA: Diagnosis not present

## 2020-01-01 DIAGNOSIS — E7800 Pure hypercholesterolemia, unspecified: Secondary | ICD-10-CM | POA: Diagnosis not present

## 2020-01-01 DIAGNOSIS — E785 Hyperlipidemia, unspecified: Secondary | ICD-10-CM | POA: Diagnosis not present

## 2020-01-01 DIAGNOSIS — F329 Major depressive disorder, single episode, unspecified: Secondary | ICD-10-CM | POA: Diagnosis not present

## 2020-01-01 DIAGNOSIS — M48062 Spinal stenosis, lumbar region with neurogenic claudication: Secondary | ICD-10-CM | POA: Diagnosis not present

## 2020-01-01 DIAGNOSIS — Z8262 Family history of osteoporosis: Secondary | ICD-10-CM | POA: Diagnosis not present

## 2020-01-01 DIAGNOSIS — S83092A Other subluxation of left patella, initial encounter: Secondary | ICD-10-CM | POA: Diagnosis not present

## 2020-01-01 DIAGNOSIS — Z79899 Other long term (current) drug therapy: Secondary | ICD-10-CM | POA: Diagnosis not present

## 2020-01-01 DIAGNOSIS — I10 Essential (primary) hypertension: Secondary | ICD-10-CM | POA: Diagnosis not present

## 2020-01-01 DIAGNOSIS — Z7952 Long term (current) use of systemic steroids: Secondary | ICD-10-CM | POA: Diagnosis not present

## 2020-01-01 DIAGNOSIS — M797 Fibromyalgia: Secondary | ICD-10-CM | POA: Diagnosis not present

## 2020-01-01 DIAGNOSIS — M1712 Unilateral primary osteoarthritis, left knee: Secondary | ICD-10-CM | POA: Diagnosis not present

## 2020-01-01 DIAGNOSIS — X58XXXA Exposure to other specified factors, initial encounter: Secondary | ICD-10-CM | POA: Diagnosis not present

## 2020-01-01 DIAGNOSIS — E2740 Unspecified adrenocortical insufficiency: Secondary | ICD-10-CM | POA: Diagnosis not present

## 2020-01-01 DIAGNOSIS — Z20822 Contact with and (suspected) exposure to covid-19: Secondary | ICD-10-CM | POA: Diagnosis not present

## 2020-01-01 DIAGNOSIS — F17210 Nicotine dependence, cigarettes, uncomplicated: Secondary | ICD-10-CM | POA: Diagnosis not present

## 2020-01-01 DIAGNOSIS — Z8249 Family history of ischemic heart disease and other diseases of the circulatory system: Secondary | ICD-10-CM | POA: Diagnosis not present

## 2020-01-01 LAB — SARS CORONAVIRUS 2 (TAT 6-24 HRS): SARS Coronavirus 2: NEGATIVE

## 2020-01-03 ENCOUNTER — Ambulatory Visit: Payer: Medicare HMO | Admitting: Anesthesiology

## 2020-01-03 ENCOUNTER — Encounter
Admission: RE | Disposition: A | Discharge status: Home / Self Care | Payer: Self-pay | Source: Ambulatory Visit | Attending: Orthopedic Surgery

## 2020-01-03 ENCOUNTER — Other Ambulatory Visit: Payer: Self-pay

## 2020-01-03 ENCOUNTER — Encounter: Payer: Self-pay | Admitting: Orthopedic Surgery

## 2020-01-03 ENCOUNTER — Ambulatory Visit
Admission: RE | Admit: 2020-01-03 | Discharge: 2020-01-03 | Disposition: A | Discharge status: Home / Self Care | Payer: Medicare HMO | Source: Ambulatory Visit | Attending: Orthopedic Surgery | Admitting: Orthopedic Surgery

## 2020-01-03 DIAGNOSIS — E7800 Pure hypercholesterolemia, unspecified: Secondary | ICD-10-CM | POA: Insufficient documentation

## 2020-01-03 DIAGNOSIS — M2202 Recurrent dislocation of patella, left knee: Secondary | ICD-10-CM | POA: Diagnosis not present

## 2020-01-03 DIAGNOSIS — M810 Age-related osteoporosis without current pathological fracture: Secondary | ICD-10-CM | POA: Insufficient documentation

## 2020-01-03 DIAGNOSIS — M797 Fibromyalgia: Secondary | ICD-10-CM | POA: Diagnosis not present

## 2020-01-03 DIAGNOSIS — I10 Essential (primary) hypertension: Secondary | ICD-10-CM | POA: Insufficient documentation

## 2020-01-03 DIAGNOSIS — M1712 Unilateral primary osteoarthritis, left knee: Secondary | ICD-10-CM | POA: Insufficient documentation

## 2020-01-03 DIAGNOSIS — Z8262 Family history of osteoporosis: Secondary | ICD-10-CM | POA: Insufficient documentation

## 2020-01-03 DIAGNOSIS — E785 Hyperlipidemia, unspecified: Secondary | ICD-10-CM | POA: Insufficient documentation

## 2020-01-03 DIAGNOSIS — F329 Major depressive disorder, single episode, unspecified: Secondary | ICD-10-CM | POA: Diagnosis not present

## 2020-01-03 DIAGNOSIS — S83092A Other subluxation of left patella, initial encounter: Secondary | ICD-10-CM | POA: Diagnosis not present

## 2020-01-03 DIAGNOSIS — F17210 Nicotine dependence, cigarettes, uncomplicated: Secondary | ICD-10-CM | POA: Insufficient documentation

## 2020-01-03 DIAGNOSIS — M48062 Spinal stenosis, lumbar region with neurogenic claudication: Secondary | ICD-10-CM | POA: Insufficient documentation

## 2020-01-03 DIAGNOSIS — E2740 Unspecified adrenocortical insufficiency: Secondary | ICD-10-CM | POA: Diagnosis not present

## 2020-01-03 DIAGNOSIS — Z8249 Family history of ischemic heart disease and other diseases of the circulatory system: Secondary | ICD-10-CM | POA: Diagnosis not present

## 2020-01-03 DIAGNOSIS — Z7952 Long term (current) use of systemic steroids: Secondary | ICD-10-CM | POA: Insufficient documentation

## 2020-01-03 DIAGNOSIS — X58XXXA Exposure to other specified factors, initial encounter: Secondary | ICD-10-CM | POA: Insufficient documentation

## 2020-01-03 DIAGNOSIS — Z20822 Contact with and (suspected) exposure to covid-19: Secondary | ICD-10-CM | POA: Insufficient documentation

## 2020-01-03 DIAGNOSIS — M222X2 Patellofemoral disorders, left knee: Secondary | ICD-10-CM | POA: Diagnosis not present

## 2020-01-03 DIAGNOSIS — Z79899 Other long term (current) drug therapy: Secondary | ICD-10-CM | POA: Insufficient documentation

## 2020-01-03 HISTORY — PX: KNEE ARTHROSCOPY WITH LATERAL RELEASE: SHX5649

## 2020-01-03 LAB — URINE DRUG SCREEN, QUALITATIVE (ARMC ONLY)
Amphetamines, Ur Screen: NOT DETECTED
Barbiturates, Ur Screen: NOT DETECTED
Benzodiazepine, Ur Scrn: NOT DETECTED
Cannabinoid 50 Ng, Ur ~~LOC~~: POSITIVE — AB
Cocaine Metabolite,Ur ~~LOC~~: NOT DETECTED
MDMA (Ecstasy)Ur Screen: NOT DETECTED
Methadone Scn, Ur: NOT DETECTED
Opiate, Ur Screen: NOT DETECTED
Phencyclidine (PCP) Ur S: NOT DETECTED
Tricyclic, Ur Screen: POSITIVE — AB

## 2020-01-03 SURGERY — ARTHROSCOPY, KNEE, WITH LATERAL RETINACULUM RELEASE
Anesthesia: General | Site: Knee | Laterality: Left

## 2020-01-03 MED ORDER — METOCLOPRAMIDE HCL 10 MG PO TABS: 5.0000 mg | ORAL_TABLET | Freq: Three times a day (TID) | ORAL | Status: DC | PRN

## 2020-01-03 MED ORDER — FAMOTIDINE 20 MG PO TABS
ORAL_TABLET | ORAL | Status: AC
  Filled 2020-01-03: qty 1

## 2020-01-03 MED ORDER — SCOPOLAMINE 1 MG/3DAYS TD PT72
MEDICATED_PATCH | TRANSDERMAL | Status: AC
  Filled 2020-01-03: qty 1

## 2020-01-03 MED ORDER — FENTANYL CITRATE (PF) 100 MCG/2ML IJ SOLN
INTRAMUSCULAR | Status: AC
  Filled 2020-01-03: qty 2

## 2020-01-03 MED ORDER — GLYCOPYRROLATE 0.2 MG/ML IJ SOLN
INTRAMUSCULAR | Status: DC | PRN
  Administered 2020-01-03: .2 mg via INTRAVENOUS

## 2020-01-03 MED ORDER — CEFAZOLIN SODIUM-DEXTROSE 2-4 GM/100ML-% IV SOLN
2.0000 g | INTRAVENOUS | Status: AC
  Administered 2020-01-03: 2 g via INTRAVENOUS

## 2020-01-03 MED ORDER — DEXMEDETOMIDINE HCL IN NACL 200 MCG/50ML IV SOLN
INTRAVENOUS | Status: DC | PRN
  Administered 2020-01-03: 8 ug via INTRAVENOUS

## 2020-01-03 MED ORDER — PROPOFOL 500 MG/50ML IV EMUL
INTRAVENOUS | Status: AC
  Filled 2020-01-03: qty 50

## 2020-01-03 MED ORDER — HYDROCORTISONE NA SUCCINATE PF 100 MG IJ SOLR
100.0000 mg | Freq: Once | INTRAMUSCULAR | Status: DC
  Filled 2020-01-03: qty 2

## 2020-01-03 MED ORDER — SCOPOLAMINE 1 MG/3DAYS TD PT72
1.0000 | MEDICATED_PATCH | TRANSDERMAL | Status: DC
  Administered 2020-01-03: 1.5 mg via TRANSDERMAL

## 2020-01-03 MED ORDER — CHLORHEXIDINE GLUCONATE 4 % EX LIQD
60.0000 mL | Freq: Once | CUTANEOUS | Status: AC
  Administered 2020-01-03: 4 via TOPICAL

## 2020-01-03 MED ORDER — MIDAZOLAM HCL 2 MG/2ML IJ SOLN
INTRAMUSCULAR | Status: AC
  Filled 2020-01-03: qty 2

## 2020-01-03 MED ORDER — MIDAZOLAM HCL 2 MG/2ML IJ SOLN
INTRAMUSCULAR | Status: DC | PRN
  Administered 2020-01-03: 2 mg via INTRAVENOUS

## 2020-01-03 MED ORDER — ONDANSETRON HCL 4 MG PO TABS: 4.0000 mg | ORAL_TABLET | Freq: Four times a day (QID) | ORAL | Status: DC | PRN

## 2020-01-03 MED ORDER — LACTATED RINGERS IV SOLN: INTRAVENOUS | Status: DC

## 2020-01-03 MED ORDER — PROPOFOL 10 MG/ML IV BOLUS
INTRAVENOUS | Status: DC | PRN
  Administered 2020-01-03: 150 mg via INTRAVENOUS

## 2020-01-03 MED ORDER — ONDANSETRON HCL 4 MG/2ML IJ SOLN: 4.0000 mg | Freq: Four times a day (QID) | INTRAMUSCULAR | Status: DC | PRN

## 2020-01-03 MED ORDER — METOCLOPRAMIDE HCL 5 MG/ML IJ SOLN: 5.0000 mg | Freq: Three times a day (TID) | INTRAMUSCULAR | Status: DC | PRN

## 2020-01-03 MED ORDER — FAMOTIDINE 20 MG PO TABS
20.0000 mg | ORAL_TABLET | Freq: Once | ORAL | Status: AC
  Administered 2020-01-03: 20 mg via ORAL

## 2020-01-03 MED ORDER — SODIUM CHLORIDE 0.9 % IV SOLN: INTRAVENOUS | Status: DC

## 2020-01-03 MED ORDER — PHENYLEPHRINE HCL (PRESSORS) 10 MG/ML IV SOLN
INTRAVENOUS | Status: DC | PRN
  Administered 2020-01-03: 200 ug via INTRAVENOUS
  Administered 2020-01-03 (×2): 100 ug via INTRAVENOUS

## 2020-01-03 MED ORDER — AMLODIPINE BESYLATE 5 MG PO TABS
5.0000 mg | ORAL_TABLET | Freq: Once | ORAL | Status: AC
  Administered 2020-01-03: 5 mg via ORAL
  Filled 2020-01-03: qty 1

## 2020-01-03 MED ORDER — ONDANSETRON HCL 4 MG/2ML IJ SOLN: 4.0000 mg | Freq: Once | INTRAMUSCULAR | Status: DC | PRN

## 2020-01-03 MED ORDER — ONDANSETRON HCL 4 MG/2ML IJ SOLN
INTRAMUSCULAR | Status: DC | PRN
  Administered 2020-01-03: 4 mg via INTRAVENOUS

## 2020-01-03 MED ORDER — METHYLPREDNISOLONE SODIUM SUCC 125 MG IJ SOLR
INTRAMUSCULAR | Status: AC
  Filled 2020-01-03: qty 2

## 2020-01-03 MED ORDER — BUPIVACAINE-EPINEPHRINE (PF) 0.5% -1:200000 IJ SOLN
INTRAMUSCULAR | Status: DC | PRN
  Administered 2020-01-03: 30 mL via PERINEURAL

## 2020-01-03 MED ORDER — LIDOCAINE HCL (CARDIAC) PF 100 MG/5ML IV SOSY
PREFILLED_SYRINGE | INTRAVENOUS | Status: DC | PRN
  Administered 2020-01-03: 100 mg via INTRAVENOUS

## 2020-01-03 MED ORDER — FENTANYL CITRATE (PF) 100 MCG/2ML IJ SOLN
INTRAMUSCULAR | Status: DC | PRN
  Administered 2020-01-03: 50 ug via INTRAVENOUS
  Administered 2020-01-03 (×2): 25 ug via INTRAVENOUS

## 2020-01-03 MED ORDER — CEFAZOLIN SODIUM-DEXTROSE 2-4 GM/100ML-% IV SOLN
INTRAVENOUS | Status: AC
  Filled 2020-01-03: qty 100

## 2020-01-03 MED ORDER — METHYLPREDNISOLONE SODIUM SUCC 125 MG IJ SOLR
100.0000 mg | Freq: Once | INTRAMUSCULAR | Status: AC
  Administered 2020-01-03: 100 mg via INTRAVENOUS

## 2020-01-03 MED ORDER — PROPOFOL 500 MG/50ML IV EMUL
INTRAVENOUS | Status: DC | PRN
  Administered 2020-01-03: 100 ug/kg/min via INTRAVENOUS

## 2020-01-03 MED ORDER — FENTANYL CITRATE (PF) 100 MCG/2ML IJ SOLN: 25.0000 ug | INTRAMUSCULAR | Status: DC | PRN

## 2020-01-03 MED ORDER — OXYCODONE HCL 5 MG PO TABS: 5.0000 mg | ORAL_TABLET | ORAL | 0 refills | Status: DC | PRN

## 2020-01-03 SURGICAL SUPPLY — 33 items
BLADE INCISOR PLUS 4.5 (BLADE) IMPLANT
BNDG ELASTIC 4X5.8 VLCR STR LF (GAUZE/BANDAGES/DRESSINGS) ×1 IMPLANT
CHLORAPREP W/TINT 26 (MISCELLANEOUS) ×2 IMPLANT
COVER WAND RF STERILE (DRAPES) ×2 IMPLANT
CUFF TOURN SGL QUICK 24 (TOURNIQUET CUFF)
CUFF TOURN SGL QUICK 30 (TOURNIQUET CUFF)
CUFF TRNQT CYL 24X4X16.5-23 (TOURNIQUET CUFF) IMPLANT
CUFF TRNQT CYL 30X4X21-28X (TOURNIQUET CUFF) IMPLANT
GAUZE SPONGE 4X4 12PLY STRL (GAUZE/BANDAGES/DRESSINGS) ×2 IMPLANT
GAUZE XEROFORM 1X8 LF (GAUZE/BANDAGES/DRESSINGS) ×1 IMPLANT
GLOVE SURG SYN 9.0  PF PI (GLOVE) ×1
GLOVE SURG SYN 9.0 PF PI (GLOVE) ×1 IMPLANT
GOWN SRG 2XL LVL 4 RGLN SLV (GOWNS) ×1 IMPLANT
GOWN STRL NON-REIN 2XL LVL4 (GOWNS) ×1
GOWN STRL REUS W/ TWL LRG LVL3 (GOWN DISPOSABLE) ×2 IMPLANT
GOWN STRL REUS W/TWL LRG LVL3 (GOWN DISPOSABLE) ×4
IMMOB KNEE 14 THIGH 24 706614 (SOFTGOODS) ×1 IMPLANT
IV LACTATED RINGER IRRG 3000ML (IV SOLUTION) ×4
IV LR IRRIG 3000ML ARTHROMATIC (IV SOLUTION) ×2 IMPLANT
KIT TURNOVER KIT A (KITS) ×2 IMPLANT
MANIFOLD NEPTUNE II (INSTRUMENTS) ×2 IMPLANT
NEEDLE HYPO 22GX1.5 SAFETY (NEEDLE) ×2 IMPLANT
PACK ARTHROSCOPY KNEE (MISCELLANEOUS) ×2 IMPLANT
PAD CAST CTTN 4X4 STRL (SOFTGOODS) IMPLANT
PADDING CAST COTTON 4X4 STRL (SOFTGOODS) ×1
SCALPEL PROTECTED #11 DISP (BLADE) ×2 IMPLANT
SET TUBE SUCT SHAVER OUTFL 24K (TUBING) ×2 IMPLANT
SET TUBE TIP INTRA-ARTICULAR (MISCELLANEOUS) ×2 IMPLANT
SUT ETHILON 4-0 (SUTURE) ×1
SUT ETHILON 4-0 FS2 18XMFL BLK (SUTURE) ×1
SUTURE ETHLN 4-0 FS2 18XMF BLK (SUTURE) ×1 IMPLANT
TUBING ARTHRO INFLOW-ONLY STRL (TUBING) ×2 IMPLANT
WAND COBLATION FLOW 50 (SURGICAL WAND) ×2 IMPLANT

## 2020-01-03 NOTE — H&P (Signed)
Chief Complaint  Patient presents with  . Follow-up  H&P Left Knee Arthroscopy, scheduled for 01/03/2020   Abigail [name] is a 54 y.o. female who presents today for her surgical history and physical for upcoming left knee arthroscopy with debridement and possible lateral release. The patient is scheduled to undergo this procedure with Dr. Rudene Christians on 01/03/2020. The patient denies any changes in her medical history since she was last evaluated. She denies any recent trauma or injury affecting the left knee. She does report chronic low back discomfort. The patient does have a spinal cord stimulator. She denies any personal history of heart attack, stroke, asthma or COPD. No personal history of blood clots. The patient does have a history of adrenal insufficiency and will require IV steroids prior to the procedure.  Past Medical History: Past Medical History:  Diagnosis Date  . Adrenal insufficiency (CMS-HCC)  . Arthritis  . Depression  . Fibromyalgia  . Hyperlipidemia  . Hypertension  . Migraines  . Osteoarthritis  . Osteoporosis   Past Surgical History: Past Surgical History:  Procedure Laterality Date  . C2-5 ACDF 10/18/2018  Dr [facility] at [facility]  . KNEE ARTHROSCOPY Bilateral  ablations of nerves  . Left Carpal Tunnel Relase Left 03/22/2018  Hessie Knows, MD  . lumbar surgery  . Right Carpal Tunnel Release Right 02/08/2018  Hessie Knows, MD  . spinal cord stimulator insertion 11/28/2019  Dr Meade Maw at [facility], [facility]   Past Family History: Family History  Problem Relation Age of Onset  . Arthritis Mother  . Osteoporosis (Thinning of bones) Mother  . Heart disease Father  . High blood pressure (Hypertension) Father  . Cystic fibrosis Brother  . Stroke Maternal Grandmother  . Osteoporosis (Thinning of bones) Maternal Grandmother  . Myocardial Infarction (Heart attack) Maternal Grandmother  . Endometriosis Sister   Medications: Current Outpatient  Medications Ordered in Epic  Medication Sig Dispense Refill  . acetaminophen (TYLENOL) 500 MG tablet Take 500 mg by mouth every 6 (six) hours as needed  . atorvastatin (LIPITOR) 20 MG tablet Take 1 tablet (20 mg total) by mouth once daily 90 tablet 1  . AZOR 5-20 mg tablet Take 1 tablet by mouth once daily 90 tablet 1  . BIOTIN ORAL Take 1 tablet by mouth once daily  . celecoxib (CELEBREX) 200 MG capsule Take 1 capsule (200 mg total) by mouth 2 (two) times daily 180 capsule 1  . cholecalciferol (VITAMIN D3) 1,000 unit tablet Take 1,000 Units by mouth once daily  . clonazePAM (KLONOPIN) 0.5 MG tablet Take 1/2 tab by mouth 2-3 times a day as necessary 45 tablet 2  . DULoxetine (CYMBALTA) 60 MG DR capsule Take 1 capsule (60 mg total) by mouth 2 (two) times daily 180 capsule 1  . fremanezumab-vfrm (AJOVY AUTOINJECTOR) 225 mg/1.5 mL AtIn Inject 225 mg subcutaneously every 28 (twenty-eight) days 1 Syringe 3  . meclizine (ANTIVERT) 12.5 mg tablet Take once a day 30 tablet 1  . nortriptyline (PAMELOR) 50 MG capsule Take 100 mg at night 180 capsule 0  . predniSONE (DELTASONE) 5 MG tablet Take 1 tablet (5 mg total) by mouth once daily 90 tablet 3  . pregabalin (LYRICA) 75 MG capsule Take 1 capsule (75 mg total) by mouth 3 (three) times daily 90 capsule 2  . propranoloL (INDERAL) 20 MG tablet Take 1 tablet (20 mg total) by mouth 2 (two) times daily 60 tablet 2  . rizatriptan (MAXALT-MLT) 10 MG disintegrating tablet TAKE 1 TAB  AT HEADACHE ONSET MAY TAKE A SECOND DOSE AFTER 2 HOURS IF NEEDED 9 tablet 1  . tiZANidine (ZANAFLEX) 2 MG tablet Take 1 tablet (2 mg total) by mouth 2 (two) times daily for 30 days 60 tablet 0  . topiramate (TOPAMAX) 25 MG tablet Take 2 tabs at night 60 tablet 1   No current Epic-ordered facility-administered medications on file.   Allergies: No Known Allergies   Review of Systems:  A comprehensive 14 point ROS was performed, reviewed by me today, and the pertinent orthopaedic  findings are documented in the HPI.  Exam: BP 110/78  Ht 162.6 cm (5\' 4" )  LMP (LMP Unknown)  BMI 29.73 kg/m  General/Constitutional: The patient appears to be well-nourished, well-developed, and in no acute distress. Neuro/Psych: Normal mood and affect, oriented to person, place and time. Eyes: Non-icteric. Pupils are equal, round, and reactive to light, and exhibit synchronous movement. ENT: Unremarkable. Lymphatic: No palpable adenopathy. Respiratory: Lungs clear to auscultation, Normal chest excursion, No wheezes and Non-labored breathing Cardiovascular: Regular rate and rhythm. No murmurs. and No edema, swelling or tenderness, except as noted in detailed exam. Integumentary: No impressive skin lesions present, except as noted in detailed exam. Musculoskeletal: Unremarkable, except as noted in detailed exam.  Musculoskeletal Examination: On exam, the patient has no instability. She has full extension and flexion to 130, with posterolateral catching sensation with extreme flexion. There is patellofemoral crepitation and a tight retinaculum. Lungs are clear. Heart rate and rhythm is normal. HEENT is normal.   Radiographs: The patient had a left knee MRI on 12/07/2018 that showed fissuring of the articular cartilage of the patellofemoral joint. Normal appearing medial and lateral compartments, subchondral cyst and edema of the patellofemoral joint. No meniscus tear. Predominantly this showed patellofemoral degenerative arthritis.   Impression: Patellofemoral pain syndrome of left knee [M22.2X2] Patellofemoral pain syndrome of left knee (primary encounter diagnosis) Primary osteoarthritis of left knee  Plan:  1. Treatment options were discussed today with the patient. 2. The patient is scheduled for a left knee arthroscopy on 01/03/2020. 3. The patient was instructed on the risk and benefits of surgery and wishes to proceed at this time. 4. This document will serve as a surgical  history and physical for the patient. The patient will need to receive IV steroids prior to the procedure. 5. The patient will follow-up per standard postop protocol, she will follow-up 3 days after surgery for skin check. They can call the clinic they have any questions, new symptoms develop or symptoms worsen.  The procedure was discussed with the patient, as were the potential risks (including bleeding, infection, nerve and/or blood vessel injury, persistent or recurrent pain, failure of the repair, progression of arthritis, need for further surgery, blood clots, strokes, heart attacks and/or arhythmias, pneumonia, etc.) and benefits. The patient states her understanding and wishes to proceed.  This office visit took 30 minutes, of which >50% involved patient counseling/education.  Review of the [place] CSRS was performed in accordance of the [place] prior to dispensing any controlled drugs.  This note was generated in part with voice recognition software and I apologize for any typographical errors that were not detected and corrected.  Raquel James, PA-C [place]   Reviewed paper H+P, will be scanned into chart. No changes noted.

## 2020-01-03 NOTE — Discharge Instructions (Addendum)
Try to minimize activities through the weekend. Aspirin 325 mg daily until walking more normally. Leave bandage in place until return visit unless it sized on the leg.  If it does remove entire bandage and applied Band-Aids over the incisions followed by wrapping with the Ace wrap only. Pain medicine as directed. Keep dressing clean and dry.   AMBULATORY SURGERY  DISCHARGE INSTRUCTIONS   1) The drugs that you were given will stay in your system until tomorrow so for the next 24 hours you should not:  A) Drive an automobile B) Make any legal decisions C) Drink any alcoholic beverage   2) You may resume regular meals tomorrow.  Today it is better to start with liquids and gradually work up to solid foods.  You may eat anything you prefer, but it is better to start with liquids, then soup and crackers, and gradually work up to solid foods.   3) Please notify your doctor immediately if you have any unusual bleeding, trouble breathing, redness and pain at the surgery site, drainage, fever, or pain not relieved by medication.    4) Additional Instructions:   Please contact your physician with any problems or Same Day Surgery at [phone], Monday through Friday 6 am to 4 pm, or [place] at [facility] number at [phone].

## 2020-01-03 NOTE — Anesthesia Preprocedure Evaluation (Addendum)
Anesthesia Evaluation  Patient identified by MRN, date of birth, ID band Patient awake    Reviewed: Allergy & Precautions, NPO status , Patient's Chart, lab work & pertinent test results  History of Anesthesia Complications (+) PONV and history of anesthetic complications  Airway Mallampati: III       Dental   Pulmonary asthma , neg sleep apnea, neg COPD, Current Smoker,           Cardiovascular hypertension, Pt. on medications (-) Past MI and (-) CHF (-) dysrhythmias (-) Valvular Problems/Murmurs     Neuro/Psych neg Seizures Anxiety Depression    GI/Hepatic Neg liver ROS, neg GERD  ,  Endo/Other  neg diabetes  Renal/GU negative Renal ROS     Musculoskeletal   Abdominal   Peds  Hematology   Anesthesia Other Findings   Reproductive/Obstetrics                            Anesthesia Physical Anesthesia Plan  ASA: III  Anesthesia Plan: General   Post-op Pain Management:    Induction: Intravenous  PONV Risk Score and Plan: 3 and Ondansetron and Dexamethasone  Airway Management Planned: LMA  Additional Equipment:   Intra-op Plan:   Post-operative Plan:   Informed Consent: I have reviewed the patients History and Physical, chart, labs and discussed the procedure including the risks, benefits and alternatives for the proposed anesthesia with the patient or authorized representative who has indicated his/her understanding and acceptance.       Plan Discussed with:   Anesthesia Plan Comments:         Anesthesia Quick Evaluation

## 2020-01-03 NOTE — Transfer of Care (Signed)
Immediate Anesthesia Transfer of Care Note  Patient: Abigail [name]  Procedure(s) Performed: KNEE ARTHROSCOPY WITH LATERAL RELEASE FOR PATELLA SUBLUXATION (Left Knee)  Patient Location: PACU  Anesthesia Type:General  Level of Consciousness: sedated  Airway & Oxygen Therapy: Patient Spontanous Breathing and Patient connected to face mask oxygen  Post-op Assessment: Report given to RN and Post -op Vital signs reviewed and stable  Post vital signs: Reviewed  Last Vitals:  Vitals Value Taken Time  BP 89/56 01/03/20 0905  Temp    Pulse 78 01/03/20 0905  Resp 9 01/03/20 0905  SpO2 98 % 01/03/20 0905  Vitals shown include unvalidated device data.  Last Pain:  Vitals:   01/03/20 0700  TempSrc: Temporal         Complications: No apparent anesthesia complications

## 2020-01-03 NOTE — Anesthesia Procedure Notes (Signed)
Procedure Name: LMA Insertion Date/Time: 01/03/2020 8:25 AM Performed by: Rolla Plate, CRNA Pre-anesthesia Checklist: Patient identified, Patient being monitored, Timeout performed, Emergency Drugs available and Suction available Patient Re-evaluated:Patient Re-evaluated prior to induction Oxygen Delivery Method: Circle system utilized Preoxygenation: Pre-oxygenation with 100% oxygen Induction Type: IV induction Ventilation: Mask ventilation without difficulty LMA: LMA inserted LMA Size: 3.5 Tube type: Oral Number of attempts: 1 Placement Confirmation: positive ETCO2 and breath sounds checked- equal and bilateral Tube secured with: Tape Dental Injury: Teeth and Oropharynx as per pre-operative assessment

## 2020-01-03 NOTE — Anesthesia Postprocedure Evaluation (Signed)
Anesthesia Post Note  Patient: Abigail [name]  Procedure(s) Performed: KNEE ARTHROSCOPY WITH LATERAL RELEASE FOR PATELLA SUBLUXATION (Left Knee)  Patient location during evaluation: PACU Anesthesia Type: General Level of consciousness: awake and alert Pain management: pain level controlled Vital Signs Assessment: post-procedure vital signs reviewed and stable Respiratory status: spontaneous breathing and respiratory function stable Cardiovascular status: stable Anesthetic complications: no     Last Vitals:  Vitals:   01/03/20 0905 01/03/20 0907  BP: (!) 89/56 115/80  Pulse: 78 73  Resp: (!) 9 (!) 9  Temp: (!) 36.1 C   SpO2: 98% 99%    Last Pain:  Vitals:   01/03/20 0905  TempSrc:   PainSc: Asleep                 Ndidi Nesby K

## 2020-01-03 NOTE — Op Note (Signed)
01/03/2020  9:01 AM  PATIENT:  Abigail [name]  54 y.o. female  PRE-OPERATIVE DIAGNOSIS:  Patellofemoral dysfunction of left knee with subluxation  POST-OPERATIVE DIAGNOSIS:  Patellofemoral dysfunction of left knee with subluxation  PROCEDURE:  Procedure(s): KNEE ARTHROSCOPY WITH LATERAL RELEASE FOR PATELLA SUBLUXATION (Left)  SURGEON: Laurene Footman, MD  ASSISTANTS: None  ANESTHESIA:   general  EBL:  Total I/O In: 500 [I.V.:500] Out: -   BLOOD ADMINISTERED:none  DRAINS: none   LOCAL MEDICATIONS USED:  MARCAINE     SPECIMEN:  No Specimen  DISPOSITION OF SPECIMEN:  N/A  COUNTS:  YES  TOURNIQUET:  * Missing tourniquet times found for documented tourniquets in log: [id number] *  IMPLANTS: None  DICTATION: .Dragon Dictation patient was brought to the operating room and after adequate general anesthesia was obtained the left leg was prepped and draped in the usual sterile fashion.  A tourniquet was applied to the upper thigh and the leg was prepped and draped in the usual sterile fashion.  After patient identification and timeout procedures were completed an inferior lateral portal was made.  Initial inspection revealed patellar subluxation with medial plica band and fat pad impinging on the medial patellofemoral joint.  There is a tight lateral retinaculum.  There is fissuring of the articular cartilage of the patella but no exposed bone.  The gutters were free of any loose bodies and there is a tight lateral retinaculum.  An inferior medial portal was made and on probing the menisci were intact as was the ACL the articular cartilage was relatively normal with a probably 3 to 4 mm cartilage defect in the medial femoral condyle in the weightbearing surface pictures of these were obtained.  An ArthroCare wand was placed and the plica ablated and then a lateral release performed giving release of the tight ligament laterally and then much better patellofemoral alignment following this  after thorough irrigation of the knee all instrumentation was withdrawn.  4-0 nylon was used to close the 2 incisions with simple interrupted suture followed by injection of 30 cc half percent Sensorcaine for postop analgesia.  Xeroform 4 x 4 ABD web roll and Ace wrap applied  PLAN OF CARE: Discharge to home after PACU  PATIENT DISPOSITION:  PACU - hemodynamically stable.

## 2020-01-09 DIAGNOSIS — Z9889 Other specified postprocedural states: Secondary | ICD-10-CM | POA: Diagnosis not present

## 2020-01-09 DIAGNOSIS — R29898 Other symptoms and signs involving the musculoskeletal system: Secondary | ICD-10-CM | POA: Diagnosis not present

## 2020-01-09 DIAGNOSIS — M25661 Stiffness of right knee, not elsewhere classified: Secondary | ICD-10-CM | POA: Diagnosis not present

## 2020-01-09 DIAGNOSIS — M25562 Pain in left knee: Secondary | ICD-10-CM | POA: Diagnosis not present

## 2020-01-14 DIAGNOSIS — Z9889 Other specified postprocedural states: Secondary | ICD-10-CM | POA: Diagnosis not present

## 2020-01-14 DIAGNOSIS — M25562 Pain in left knee: Secondary | ICD-10-CM | POA: Diagnosis not present

## 2020-01-16 DIAGNOSIS — Z9889 Other specified postprocedural states: Secondary | ICD-10-CM | POA: Diagnosis not present

## 2020-01-16 DIAGNOSIS — M25562 Pain in left knee: Secondary | ICD-10-CM | POA: Diagnosis not present

## 2020-01-17 DIAGNOSIS — M7581 Other shoulder lesions, right shoulder: Secondary | ICD-10-CM | POA: Diagnosis not present

## 2020-01-17 DIAGNOSIS — M542 Cervicalgia: Secondary | ICD-10-CM | POA: Diagnosis not present

## 2020-01-17 DIAGNOSIS — M7582 Other shoulder lesions, left shoulder: Secondary | ICD-10-CM | POA: Diagnosis not present

## 2020-02-04 ENCOUNTER — Telehealth: Payer: Self-pay | Admitting: Pain Medicine

## 2020-02-04 NOTE — Telephone Encounter (Signed)
Patient called inquiring about a SCS trial for the Cervical / shoulder area? She has one in lower back and now would like shoulder area. States Dr. Holley Raring told her he doesn't do the shoulder area it would have to be Dr. Dossie Arbour. Says Dr. Lacinda Axon sent msg to Dr. Dossie Arbour.

## 2020-02-13 DIAGNOSIS — R519 Headache, unspecified: Secondary | ICD-10-CM | POA: Diagnosis not present

## 2020-02-13 DIAGNOSIS — R251 Tremor, unspecified: Secondary | ICD-10-CM | POA: Diagnosis not present

## 2020-02-13 DIAGNOSIS — R42 Dizziness and giddiness: Secondary | ICD-10-CM | POA: Diagnosis not present

## 2020-02-14 DIAGNOSIS — E7800 Pure hypercholesterolemia, unspecified: Secondary | ICD-10-CM | POA: Diagnosis not present

## 2020-02-14 DIAGNOSIS — Z79899 Other long term (current) drug therapy: Secondary | ICD-10-CM | POA: Diagnosis not present

## 2020-02-14 DIAGNOSIS — M5412 Radiculopathy, cervical region: Secondary | ICD-10-CM | POA: Diagnosis not present

## 2020-02-21 DIAGNOSIS — E2740 Unspecified adrenocortical insufficiency: Secondary | ICD-10-CM | POA: Diagnosis not present

## 2020-02-21 DIAGNOSIS — F3340 Major depressive disorder, recurrent, in remission, unspecified: Secondary | ICD-10-CM | POA: Diagnosis not present

## 2020-02-21 DIAGNOSIS — M797 Fibromyalgia: Secondary | ICD-10-CM | POA: Diagnosis not present

## 2020-02-21 DIAGNOSIS — E785 Hyperlipidemia, unspecified: Secondary | ICD-10-CM | POA: Diagnosis not present

## 2020-02-21 DIAGNOSIS — R300 Dysuria: Secondary | ICD-10-CM | POA: Diagnosis not present

## 2020-02-21 DIAGNOSIS — E236 Other disorders of pituitary gland: Secondary | ICD-10-CM | POA: Diagnosis not present

## 2020-02-21 DIAGNOSIS — Z7952 Long term (current) use of systemic steroids: Secondary | ICD-10-CM | POA: Diagnosis not present

## 2020-02-21 DIAGNOSIS — Z79899 Other long term (current) drug therapy: Secondary | ICD-10-CM | POA: Diagnosis not present

## 2020-02-21 DIAGNOSIS — I10 Essential (primary) hypertension: Secondary | ICD-10-CM | POA: Diagnosis not present

## 2020-02-21 DIAGNOSIS — N3946 Mixed incontinence: Secondary | ICD-10-CM | POA: Diagnosis not present

## 2020-02-28 ENCOUNTER — Ambulatory Visit: Payer: Medicare HMO | Admitting: Pain Medicine

## 2020-03-02 IMAGING — MR MR CERVICAL SPINE W/O CM
5 series · 39 of 48 positions shown · non-contrast
Comparison: 08/04/2013

CLINICAL DATA: Pain across both shoulders into the upper arms.

EXAM:
MRI CERVICAL SPINE WITHOUT CONTRAST
TECHNIQUE: Multiplanar, multisequence MR imaging of the cervical spine was
performed. No intravenous contrast was administered.

[Series 5: T2 · sagittal · 3.0mm · 0.62mm/px · 6 of 15 slices shown (1 of 2)]
[im 1/15]
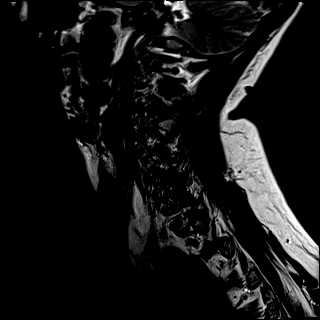
[im 3/15]
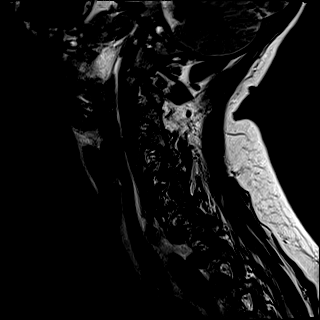
[im 6/15]
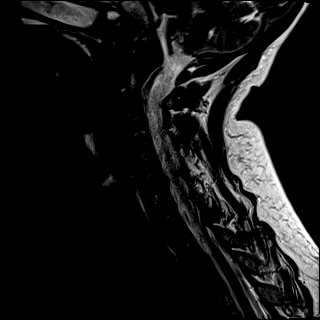
[im 9/15]
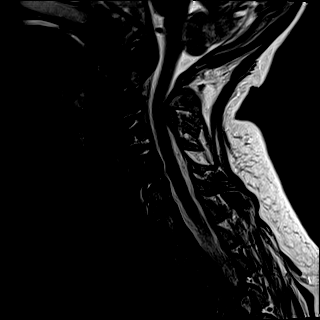
[im 12/15]
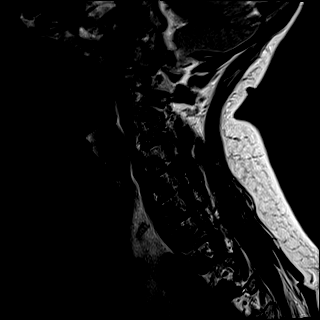
[im 15/15]
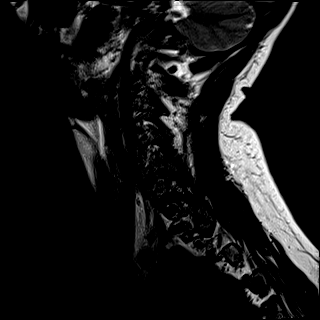

[Series 6: FLAIR · sagittal · 3.0mm · 0.78mm/px · 7 of 15 slices shown]
[im 1/15]
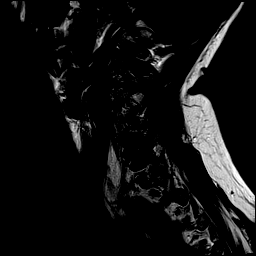
[im 3/15]
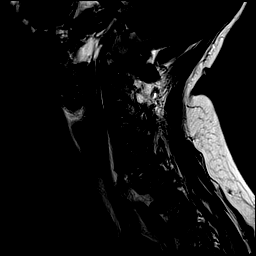
[im 5/15]
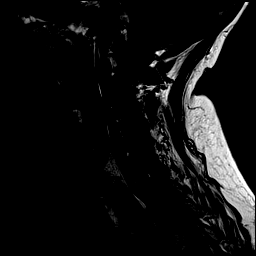
[im 8/15]
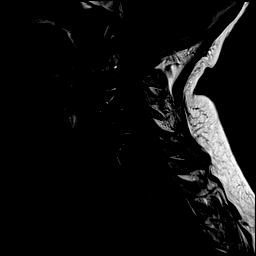
[im 10/15]
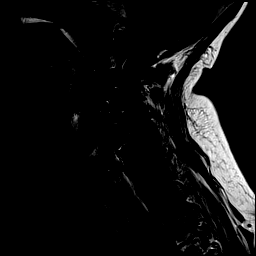
[im 12/15]
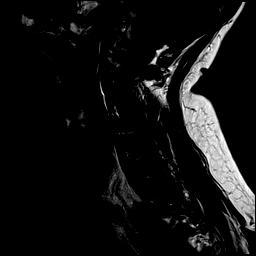
[im 15/15]
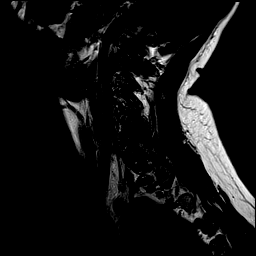

[Series 7: STIR · sagittal · 3.0mm · 0.62mm/px · 7 of 15 slices shown]
[im 1/15]
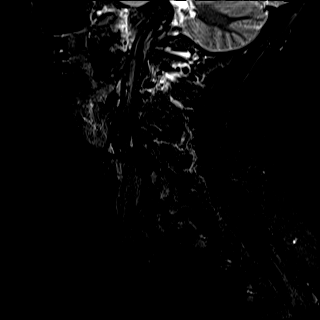
[im 3/15]
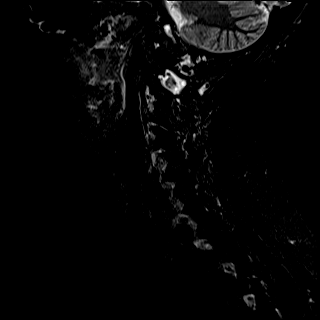
[im 5/15]
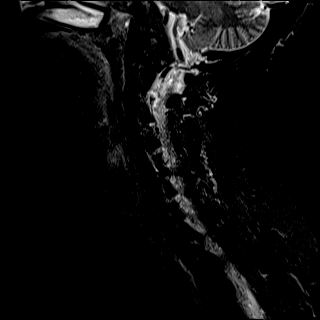
[im 8/15]
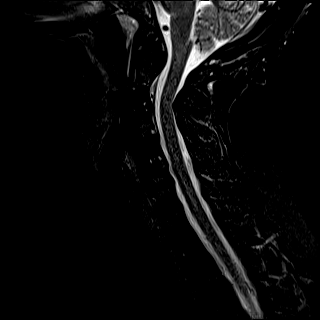
[im 10/15]
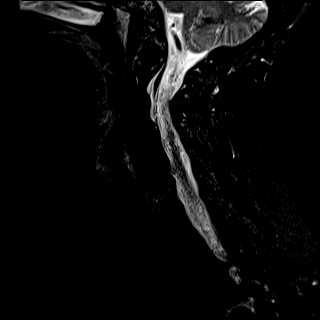
[im 12/15]
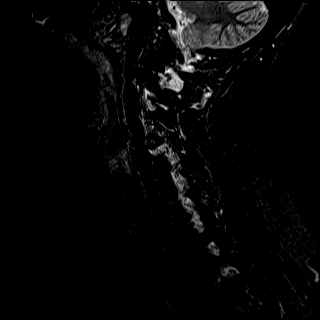
[im 15/15]
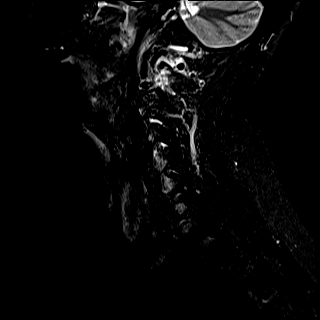

[Series 8: T2 · axial · 3.0mm · 0.70mm/px · z∈[-129,-28]mm · 11 of 31 slices shown (2 of 2)]
[im 1/31]
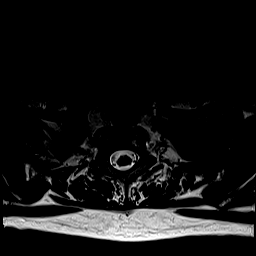
[im 3/31]
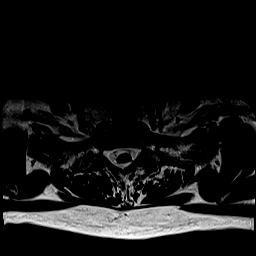
[im 5/31]
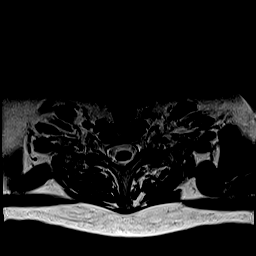
[im 7/31]
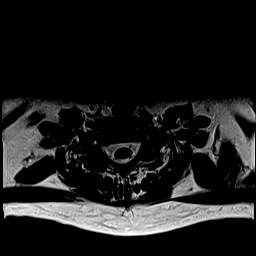
[im 10/31]
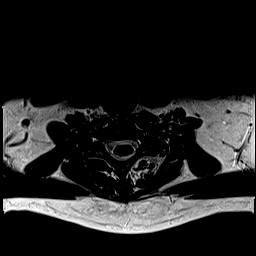
[im 12/31]
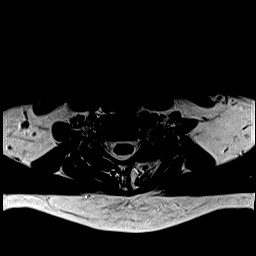
[im 14/31]
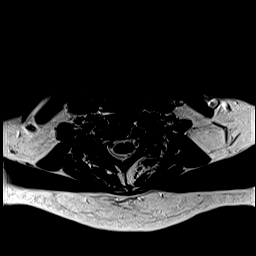
[im 17/31]
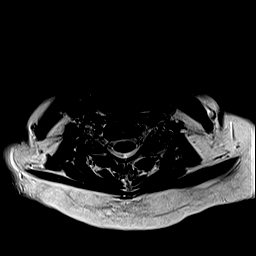
[im 21/31]
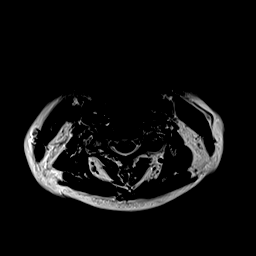
[im 26/31]
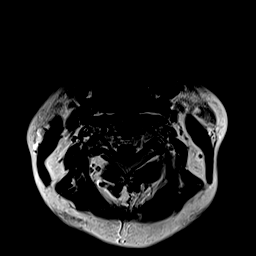
[im 31/31]
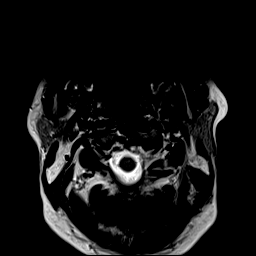

[Series 9: ax mpgr · axial · 3.0mm · 0.35mm/px · z∈[-129,-28]mm · 8 of 31 slices shown]
[im 1/31]
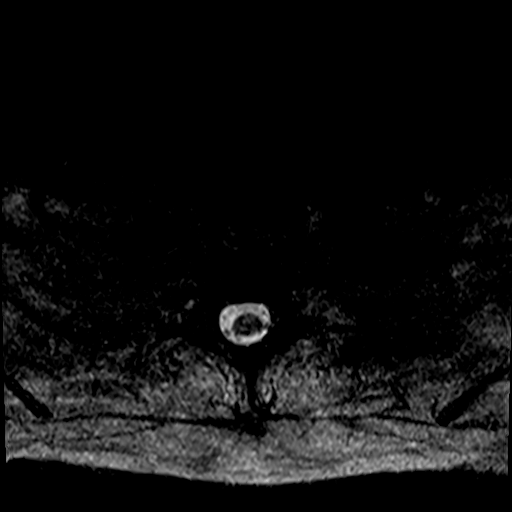
[im 5/31]
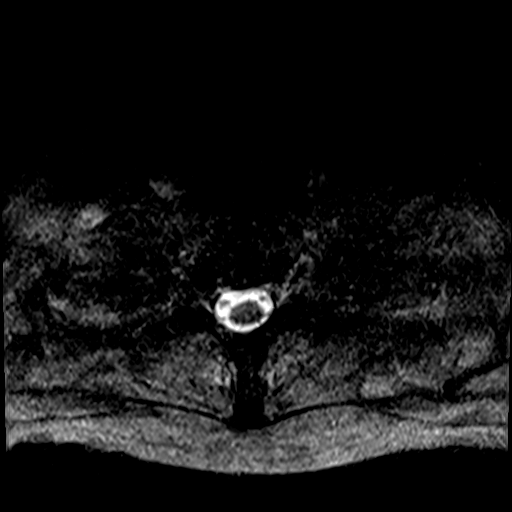
[im 10/31]
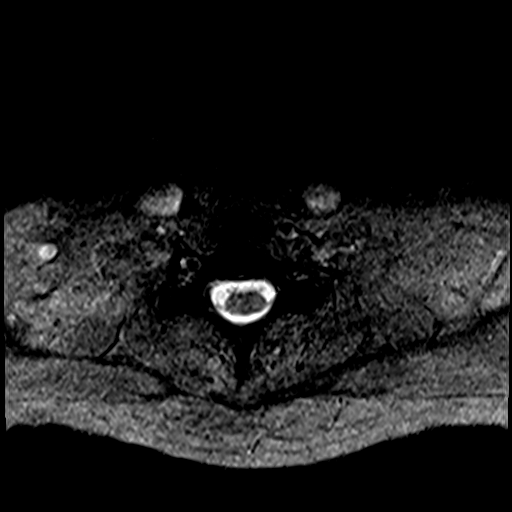
[im 14/31]
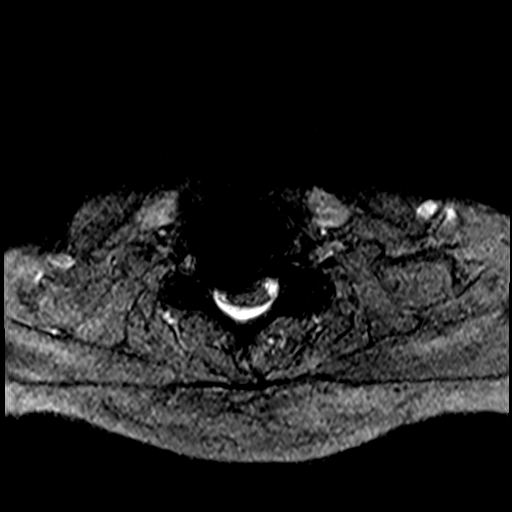
[im 17/31]
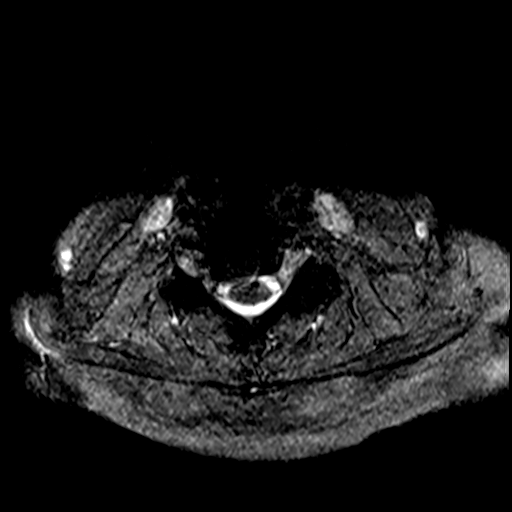
[im 21/31]
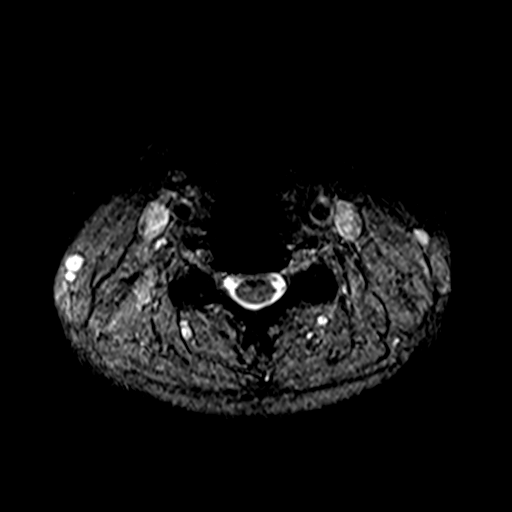
[im 26/31]
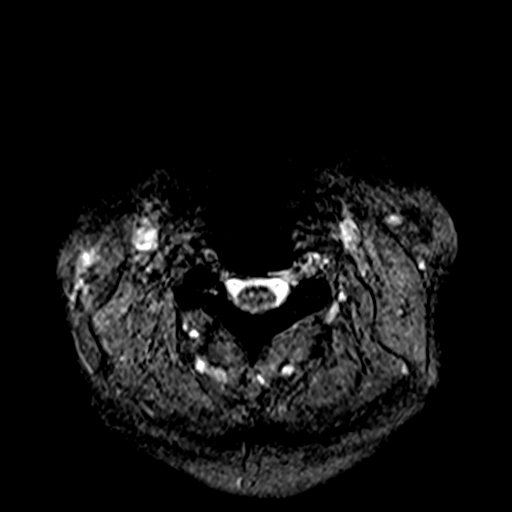
[im 31/31]
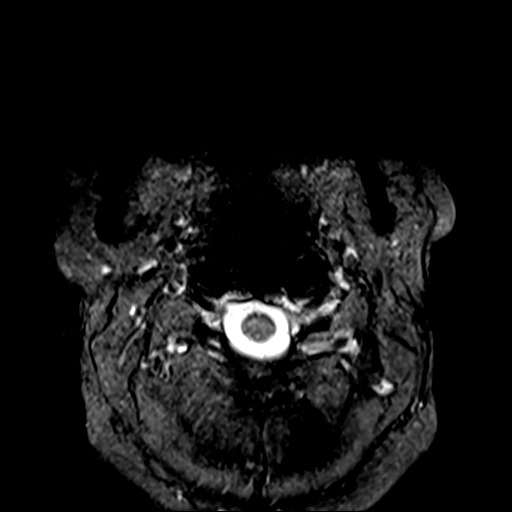

[39 of 48 positions shown; findings below may reference images not displayed]

FINDINGS: Alignment: Slight anterolisthesis at C7-T1.

Vertebrae: No fracture, evidence of discitis, or bone lesion.
Interval C2-C5 ACDF with probable solid arthrodesis.

Cord: Normal signal and morphology.

Posterior Fossa, vertebral arteries, paraspinal tissues: Negative

Disc levels:

C1-2: Increased retro dental ligamentous thickening, likely adjacent
level manifestation. No cervicomedullary impingement.

C2-3: ACDF.  No impingement

C3-4: Facet osteoarthritis. ACDF with probable arthrodesis. No
visible impingement

C4-5: Facet osteoarthritis. ACDF with probable solid arthrodesis. No
visible impingement

C5-6: Facet osteoarthritis with prominent spurring asymmetric to the
left. Minor disc bulging. No neural impingement-left foramen patency
is best demonstrated on axial gradient images.

C6-7: Facet osteoarthritis with spurring that is moderate. Slight
anterolisthesis is present. No herniation or impingement

C7-T1:Mild facet spurring
IMPRESSION: 1. Diffuse cervical facet osteoarthritis mild anterolisthesis at
C6-7.
2. ACDF from C2-C5 with probable solid arthrodesis.
3. After fusion there is increased retro dental ligamentous
thickening but no cervicomedullary impingement.
4. Diffusely patent spinal canal and foramina.

## 2020-03-04 DIAGNOSIS — M5412 Radiculopathy, cervical region: Secondary | ICD-10-CM | POA: Diagnosis not present

## 2020-03-12 DIAGNOSIS — M5412 Radiculopathy, cervical region: Secondary | ICD-10-CM | POA: Diagnosis not present

## 2020-03-17 ENCOUNTER — Other Ambulatory Visit: Payer: Self-pay | Admitting: Neurosurgery

## 2020-03-18 DIAGNOSIS — G894 Chronic pain syndrome: Secondary | ICD-10-CM | POA: Diagnosis not present

## 2020-03-19 ENCOUNTER — Encounter
Admission: RE | Admit: 2020-03-19 | Discharge: 2020-03-19 | Disposition: A | Discharge status: Home / Self Care | Payer: Medicare HMO | Source: Ambulatory Visit | Attending: Neurosurgery | Admitting: Neurosurgery

## 2020-03-19 ENCOUNTER — Other Ambulatory Visit: Payer: Self-pay

## 2020-03-19 DIAGNOSIS — Z01818 Encounter for other preprocedural examination: Secondary | ICD-10-CM | POA: Insufficient documentation

## 2020-03-19 HISTORY — DX: Tremor, unspecified: R25.1

## 2020-03-19 HISTORY — DX: Chronic kidney disease, unspecified: N18.9

## 2020-03-19 HISTORY — DX: Depression, unspecified: F32.A

## 2020-03-19 NOTE — Patient Instructions (Addendum)
INSTRUCTIONS FOR SURGERY     Your surgery is scheduled for:   Monday, June 21ST     To find out your arrival time for the day of surgery,          please call [phone] between 1 pm and 3 pm on : Friday, June 18TH     When you arrive for surgery, report to the [facility].       Do NOT stop on the first floor to register.    REMEMBER: Instructions that are not followed completely may result in serious medical risk,  up to and including death, or upon the discretion of your surgeon and anesthesiologist,            your surgery may need to be rescheduled.  __X__ 1. Do not eat food after midnight the night before your procedure.                    No gum, candy, lozenger, tic tacs, tums or hard candies.                  ABSOLUTELY NOTHING SOLID IN YOUR MOUTH AFTER MIDNIGHT                    You may drink unlimited clear liquids up to 2 hours before you are scheduled to arrive for surgery.                   Do not drink anything within those 2 hours unless you need to take medicine, then take the                   smallest amount you need.  Clear liquids include:  water, apple juice without pulp,                   any flavor Gatorade, Black coffee, black tea.  Sugar may be added but no dairy/ honey /lemon.                        Broth and jello is not considered a clear liquid.  __x__  2. On the morning of surgery, please brush your teeth with toothpaste and water. You may rinse with                  mouthwash if you wish but DO NOT SWALLOW TOOTHPASTE OR MOUTHWASH  __X___3. NO alcohol for 24 hours before or after surgery.  __x___ 4.  Do NOT smoke or use e-cigarettes for 24 HOURS PRIOR TO SURGERY.                      DO NOT Use any chewable tobacco products for at least 6 hours prior to surgery.  __x___ 5. If you start any new medication after this appointment and prior to surgery, please  Bring it with you on the day of surgery.  ___x__ 6. Notify your doctor if there is any change in your medical condition, such as fever,  infection, vomitting, diarrhea or any open sores.  __x___ 7.  USE the CHG SOAP as instructed, the night before surgery and the day of surgery.                   Once you have washed with this soap, do NOT use any of the following: Powders, perfumes                    or lotions. Please do not wear make up, hairpins, clips or nail polish. You may wear deodorant.                   Men may shave their face and neck.  Women need to shave 48 hours prior to surgery.                   DO NOT wear ANY jewelry on the day of surgery. If there are rings that are too tight to                    remove easily, please address this prior to the surgery day. Piercings need to be removed.                                                                     NO METAL ON YOUR BODY.                    Do NOT bring any valuables.  If you came to Pre-Admit testing then you will not need license,                     insurance card or credit card.  If you will be staying overnight, please either leave your things in                     the car or have your family be responsible for these items.                     [facility] IS NOT RESPONSIBLE FOR BELONGINGS OR VALUABLES.  ___X__ 8. DO NOT wear contact lenses on surgery day.  You may not have dentures,                     Hearing aides, contacts or glasses in the operating room. These items can be                    Placed in the Recovery Room to receive immediately after surgery.  __x___ 9. IF YOU ARE SCHEDULED TO GO HOME ON THE SAME DAY, YOU MUST                   Have someone to drive you home and to stay with you  for the first 24 hours.                    Have an arrangement prior to arriving on surgery day.  ___x__ 10. Take the following medications on the morning of surgery with a sip of water:  1.AMLODIPINE                     2.CYMBALTA                     3.PREDNISONE                     4.INDERAL                     5.LYRICA                     6.LIPITOR                     7.MYRBETRIQ                     8.NORCO, if you need it  _____ 11.  Follow any instructions provided to you by your surgeon.                        Such as enema, clear liquid bowel prep  __X__  12. STOP ALL ASPIRIN PRODUCTS AS OF ONE WEEK PRIOR TO SURGERY.                       THIS INCLUDES BC POWDERS / GOODIES POWDER  __x___ 13. STOP Anti-inflammatories as of: ONE WEEK PRIOR TO SURGERY                      This includes IBUPROFEN / MOTRIN / ADVIL / ALEVE/ NAPROXYN / CELEBREX                    YOU MAY TAKE TYLENOL ANY TIME PRIOR TO SURGERY.  _X____ 14.  Stop supplements until after surgery.                     This includes:BIOTIN                 You may continue taking Vitamin B12 / Vitamin D3 but do not take on the morning of surgery.  __X____17.  Continue to take the following medications but do not take on the morning of surgery:                          BENICAR // ZANAFLEX  __X____18. If staying overnight, please have appropriate shoes to wear to be able to walk around the unit.                   Wear clean and comfortable clothing to the hospital. Have a loose fitting shirt with you.  IF YOU HAVE COMPLETED THE MEDICAL ADVANCE DIRECTIVES BOOKLET, please bring with you So that we may put a copy into your chart.  BRING YOUR DEVICE FOR YOUR LOWER BACK STIMULATOR.

## 2020-03-20 ENCOUNTER — Other Ambulatory Visit: Payer: Medicare HMO

## 2020-03-27 ENCOUNTER — Other Ambulatory Visit: Payer: Self-pay

## 2020-03-27 ENCOUNTER — Encounter
Admission: RE | Admit: 2020-03-27 | Discharge: 2020-03-27 | Disposition: A | Discharge status: Home / Self Care | Payer: Medicare HMO | Source: Ambulatory Visit | Attending: Neurosurgery | Admitting: Neurosurgery

## 2020-03-27 DIAGNOSIS — N3946 Mixed incontinence: Secondary | ICD-10-CM | POA: Diagnosis not present

## 2020-03-27 DIAGNOSIS — Z20822 Contact with and (suspected) exposure to covid-19: Secondary | ICD-10-CM | POA: Insufficient documentation

## 2020-03-27 DIAGNOSIS — Z01812 Encounter for preprocedural laboratory examination: Secondary | ICD-10-CM | POA: Insufficient documentation

## 2020-03-27 LAB — URINALYSIS, ROUTINE W REFLEX MICROSCOPIC
Bacteria, UA: NONE SEEN
Bilirubin Urine: NEGATIVE
Glucose, UA: NEGATIVE mg/dL
Ketones, ur: NEGATIVE mg/dL
Leukocytes,Ua: NEGATIVE
Nitrite: NEGATIVE
Protein, ur: NEGATIVE mg/dL
Specific Gravity, Urine: 1.012 (ref 1.005–1.030)
pH: 5 (ref 5.0–8.0)

## 2020-03-27 LAB — CBC
HCT: 45.6 % (ref 36.0–46.0)
Hemoglobin: 15.5 g/dL — ABNORMAL HIGH (ref 12.0–15.0)
MCH: 30.5 pg (ref 26.0–34.0)
MCHC: 34 g/dL (ref 30.0–36.0)
MCV: 89.8 fL (ref 80.0–100.0)
Platelets: 261 10*3/uL (ref 150–400)
RBC: 5.08 MIL/uL (ref 3.87–5.11)
RDW: 13.1 % (ref 11.5–15.5)
WBC: 14.8 10*3/uL — ABNORMAL HIGH (ref 4.0–10.5)
nRBC: 0 % (ref 0.0–0.2)

## 2020-03-27 LAB — BASIC METABOLIC PANEL
Anion gap: 8 (ref 5–15)
BUN: 15 mg/dL (ref 6–20)
CO2: 24 mmol/L (ref 22–32)
Calcium: 9.1 mg/dL (ref 8.9–10.3)
Chloride: 105 mmol/L (ref 98–111)
Creatinine, Ser: 0.99 mg/dL (ref 0.44–1.00)
GFR calc Af Amer: >60 mL/min (ref >60)
GFR calc non Af Amer: >60 mL/min (ref >60)
Glucose, Bld: 104 mg/dL — ABNORMAL HIGH (ref 70–99)
Potassium: 4.2 mmol/L (ref 3.5–5.1)
Sodium: 137 mmol/L (ref 135–145)

## 2020-03-27 LAB — TYPE AND SCREEN
ABO/RH(D): O POS
Antibody Screen: NEGATIVE

## 2020-03-27 LAB — SARS CORONAVIRUS 2 (TAT 6-24 HRS): SARS Coronavirus 2: NEGATIVE

## 2020-03-27 LAB — PROTIME-INR
INR: 1 (ref 0.8–1.2)
Prothrombin Time: 12.4 seconds (ref 11.4–15.2)

## 2020-03-27 LAB — SURGICAL PCR SCREEN
MRSA, PCR: NEGATIVE
Staphylococcus aureus: POSITIVE — AB

## 2020-03-27 LAB — APTT: aPTT: 30 seconds (ref 24–36)

## 2020-03-31 ENCOUNTER — Ambulatory Visit
Admission: RE | Admit: 2020-03-31 | Discharge: 2020-03-31 | Disposition: A | Discharge status: Home / Self Care | Payer: Medicare HMO | Source: Ambulatory Visit | Attending: Neurosurgery | Admitting: Neurosurgery

## 2020-03-31 ENCOUNTER — Ambulatory Visit: Payer: Medicare HMO

## 2020-03-31 ENCOUNTER — Encounter
Admission: RE | Disposition: A | Discharge status: Home / Self Care | Payer: Self-pay | Source: Ambulatory Visit | Attending: Neurosurgery

## 2020-03-31 ENCOUNTER — Ambulatory Visit: Payer: Medicare HMO | Admitting: Certified Registered"

## 2020-03-31 ENCOUNTER — Other Ambulatory Visit: Payer: Self-pay

## 2020-03-31 ENCOUNTER — Encounter: Payer: Self-pay | Admitting: Neurosurgery

## 2020-03-31 DIAGNOSIS — Z8673 Personal history of transient ischemic attack (TIA), and cerebral infarction without residual deficits: Secondary | ICD-10-CM | POA: Diagnosis not present

## 2020-03-31 DIAGNOSIS — F17210 Nicotine dependence, cigarettes, uncomplicated: Secondary | ICD-10-CM | POA: Insufficient documentation

## 2020-03-31 DIAGNOSIS — J45909 Unspecified asthma, uncomplicated: Secondary | ICD-10-CM | POA: Insufficient documentation

## 2020-03-31 DIAGNOSIS — Z419 Encounter for procedure for purposes other than remedying health state, unspecified: Secondary | ICD-10-CM

## 2020-03-31 DIAGNOSIS — F419 Anxiety disorder, unspecified: Secondary | ICD-10-CM | POA: Insufficient documentation

## 2020-03-31 DIAGNOSIS — Z7951 Long term (current) use of inhaled steroids: Secondary | ICD-10-CM | POA: Diagnosis not present

## 2020-03-31 DIAGNOSIS — G894 Chronic pain syndrome: Secondary | ICD-10-CM | POA: Diagnosis not present

## 2020-03-31 DIAGNOSIS — M5412 Radiculopathy, cervical region: Secondary | ICD-10-CM | POA: Diagnosis not present

## 2020-03-31 DIAGNOSIS — Z79899 Other long term (current) drug therapy: Secondary | ICD-10-CM | POA: Diagnosis not present

## 2020-03-31 DIAGNOSIS — F329 Major depressive disorder, single episode, unspecified: Secondary | ICD-10-CM | POA: Diagnosis not present

## 2020-03-31 DIAGNOSIS — Z791 Long term (current) use of non-steroidal anti-inflammatories (NSAID): Secondary | ICD-10-CM | POA: Insufficient documentation

## 2020-03-31 DIAGNOSIS — G43909 Migraine, unspecified, not intractable, without status migrainosus: Secondary | ICD-10-CM | POA: Diagnosis not present

## 2020-03-31 DIAGNOSIS — I129 Hypertensive chronic kidney disease with stage 1 through stage 4 chronic kidney disease, or unspecified chronic kidney disease: Secondary | ICD-10-CM | POA: Insufficient documentation

## 2020-03-31 DIAGNOSIS — Z7952 Long term (current) use of systemic steroids: Secondary | ICD-10-CM | POA: Insufficient documentation

## 2020-03-31 DIAGNOSIS — N1831 Chronic kidney disease, stage 3a: Secondary | ICD-10-CM | POA: Insufficient documentation

## 2020-03-31 DIAGNOSIS — Z9689 Presence of other specified functional implants: Secondary | ICD-10-CM

## 2020-03-31 DIAGNOSIS — M79601 Pain in right arm: Secondary | ICD-10-CM | POA: Insufficient documentation

## 2020-03-31 DIAGNOSIS — Z969 Presence of functional implant, unspecified: Secondary | ICD-10-CM | POA: Diagnosis not present

## 2020-03-31 DIAGNOSIS — Z7982 Long term (current) use of aspirin: Secondary | ICD-10-CM | POA: Diagnosis not present

## 2020-03-31 DIAGNOSIS — M542 Cervicalgia: Secondary | ICD-10-CM | POA: Insufficient documentation

## 2020-03-31 DIAGNOSIS — M79602 Pain in left arm: Secondary | ICD-10-CM | POA: Diagnosis not present

## 2020-03-31 HISTORY — PX: PULSE GENERATOR IMPLANT: SHX5370

## 2020-03-31 HISTORY — PX: SPINAL CORD STIMULATOR INSERTION: SHX5378

## 2020-03-31 LAB — URINE DRUG SCREEN, QUALITATIVE (ARMC ONLY)
Amphetamines, Ur Screen: NOT DETECTED
Barbiturates, Ur Screen: NOT DETECTED
Benzodiazepine, Ur Scrn: NOT DETECTED
Cannabinoid 50 Ng, Ur ~~LOC~~: POSITIVE — AB
Cocaine Metabolite,Ur ~~LOC~~: NOT DETECTED
MDMA (Ecstasy)Ur Screen: NOT DETECTED
Methadone Scn, Ur: NOT DETECTED
Opiate, Ur Screen: NOT DETECTED
Phencyclidine (PCP) Ur S: NOT DETECTED
Tricyclic, Ur Screen: POSITIVE — AB

## 2020-03-31 SURGERY — INSERTION, SPINAL CORD STIMULATOR, CERVICAL
Anesthesia: General

## 2020-03-31 MED ORDER — BUPIVACAINE-EPINEPHRINE (PF) 0.5% -1:200000 IJ SOLN
INTRAMUSCULAR | Status: AC
  Filled 2020-03-31: qty 30

## 2020-03-31 MED ORDER — REMIFENTANIL HCL 1 MG IV SOLR
INTRAVENOUS | Status: AC
  Filled 2020-03-31: qty 1000

## 2020-03-31 MED ORDER — HYDROCODONE-ACETAMINOPHEN 5-300 MG PO TABS: 1.0000 | ORAL_TABLET | Freq: Four times a day (QID) | ORAL | 0 refills | Status: AC | PRN

## 2020-03-31 MED ORDER — EPHEDRINE SULFATE 50 MG/ML IJ SOLN
INTRAMUSCULAR | Status: DC | PRN
  Administered 2020-03-31 (×3): 10 mg via INTRAVENOUS
  Administered 2020-03-31: 20 mg via INTRAVENOUS

## 2020-03-31 MED ORDER — SCOPOLAMINE 1 MG/3DAYS TD PT72: 1.0000 | MEDICATED_PATCH | Freq: Once | TRANSDERMAL | Status: DC

## 2020-03-31 MED ORDER — PROPOFOL 10 MG/ML IV BOLUS
INTRAVENOUS | Status: DC | PRN
  Administered 2020-03-31: 120 mg via INTRAVENOUS

## 2020-03-31 MED ORDER — FENTANYL CITRATE (PF) 100 MCG/2ML IJ SOLN
INTRAMUSCULAR | Status: AC
  Administered 2020-03-31: 25 ug via INTRAVENOUS
  Filled 2020-03-31: qty 2

## 2020-03-31 MED ORDER — ARTIFICIAL TEARS OPHTHALMIC OINT
TOPICAL_OINTMENT | OPHTHALMIC | Status: AC
  Filled 2020-03-31: qty 3.5

## 2020-03-31 MED ORDER — HYDROCODONE-ACETAMINOPHEN 5-325 MG PO TABS
1.0000 | ORAL_TABLET | Freq: Once | ORAL | Status: AC
  Administered 2020-03-31: 1 via ORAL

## 2020-03-31 MED ORDER — ACETAMINOPHEN 10 MG/ML IV SOLN
INTRAVENOUS | Status: AC
  Filled 2020-03-31: qty 100

## 2020-03-31 MED ORDER — PROPOFOL 500 MG/50ML IV EMUL
INTRAVENOUS | Status: AC
  Filled 2020-03-31: qty 50

## 2020-03-31 MED ORDER — MIDAZOLAM HCL 2 MG/2ML IJ SOLN
INTRAMUSCULAR | Status: DC | PRN
  Administered 2020-03-31: 2 mg via INTRAVENOUS

## 2020-03-31 MED ORDER — SUCCINYLCHOLINE CHLORIDE 20 MG/ML IJ SOLN
INTRAMUSCULAR | Status: DC | PRN
  Administered 2020-03-31: 100 mg via INTRAVENOUS

## 2020-03-31 MED ORDER — FENTANYL CITRATE (PF) 100 MCG/2ML IJ SOLN
INTRAMUSCULAR | Status: DC | PRN
  Administered 2020-03-31: 100 ug via INTRAVENOUS

## 2020-03-31 MED ORDER — PROPOFOL 500 MG/50ML IV EMUL
INTRAVENOUS | Status: DC | PRN
  Administered 2020-03-31: 125 ug/kg/min via INTRAVENOUS

## 2020-03-31 MED ORDER — LACTATED RINGERS IV SOLN: INTRAVENOUS | Status: DC

## 2020-03-31 MED ORDER — METHYLPREDNISOLONE SODIUM SUCC 125 MG IJ SOLR
INTRAMUSCULAR | Status: AC
  Filled 2020-03-31: qty 2

## 2020-03-31 MED ORDER — ACETAMINOPHEN 10 MG/ML IV SOLN: 1000.0000 mg | Freq: Once | INTRAVENOUS | Status: DC | PRN

## 2020-03-31 MED ORDER — METHYLPREDNISOLONE SODIUM SUCC 125 MG IJ SOLR
INTRAMUSCULAR | Status: DC | PRN
Start: 2020-03-31 — End: 2020-03-31
  Administered 2020-03-31: 125 mg via INTRAVENOUS

## 2020-03-31 MED ORDER — FENTANYL CITRATE (PF) 100 MCG/2ML IJ SOLN
INTRAMUSCULAR | Status: AC
  Filled 2020-03-31: qty 2

## 2020-03-31 MED ORDER — ONDANSETRON HCL 4 MG/2ML IJ SOLN
INTRAMUSCULAR | Status: AC
  Filled 2020-03-31: qty 2

## 2020-03-31 MED ORDER — OXYCODONE HCL 5 MG PO TABS: 5.0000 mg | ORAL_TABLET | Freq: Once | ORAL | Status: DC | PRN

## 2020-03-31 MED ORDER — LIDOCAINE-EPINEPHRINE 1 %-1:100000 IJ SOLN
INTRAMUSCULAR | Status: AC
  Filled 2020-03-31: qty 1

## 2020-03-31 MED ORDER — LIDOCAINE HCL (CARDIAC) PF 100 MG/5ML IV SOSY
PREFILLED_SYRINGE | INTRAVENOUS | Status: DC | PRN
  Administered 2020-03-31: 80 mg via INTRAVENOUS

## 2020-03-31 MED ORDER — OXYCODONE HCL 5 MG/5ML PO SOLN: 5.0000 mg | Freq: Once | ORAL | Status: DC | PRN

## 2020-03-31 MED ORDER — SUCCINYLCHOLINE CHLORIDE 200 MG/10ML IV SOSY
PREFILLED_SYRINGE | INTRAVENOUS | Status: AC
  Filled 2020-03-31: qty 10

## 2020-03-31 MED ORDER — SCOPOLAMINE 1 MG/3DAYS TD PT72
MEDICATED_PATCH | TRANSDERMAL | Status: AC
  Administered 2020-03-31: 1.5 mg via TRANSDERMAL
  Filled 2020-03-31: qty 1

## 2020-03-31 MED ORDER — SODIUM CHLORIDE (PF) 0.9 % IJ SOLN
INTRAMUSCULAR | Status: AC
  Filled 2020-03-31: qty 20

## 2020-03-31 MED ORDER — CEFAZOLIN SODIUM-DEXTROSE 2-4 GM/100ML-% IV SOLN
INTRAVENOUS | Status: AC
  Filled 2020-03-31: qty 100

## 2020-03-31 MED ORDER — PROPOFOL 10 MG/ML IV BOLUS
INTRAVENOUS | Status: AC
  Filled 2020-03-31: qty 20

## 2020-03-31 MED ORDER — ACETAMINOPHEN 10 MG/ML IV SOLN
INTRAVENOUS | Status: DC | PRN
Start: 2020-03-31 — End: 2020-03-31
  Administered 2020-03-31: 1000 mg via INTRAVENOUS

## 2020-03-31 MED ORDER — FAMOTIDINE 20 MG PO TABS
ORAL_TABLET | ORAL | Status: AC
  Filled 2020-03-31: qty 1

## 2020-03-31 MED ORDER — BUPIVACAINE-EPINEPHRINE (PF) 0.5% -1:200000 IJ SOLN
INTRAMUSCULAR | Status: DC | PRN
  Administered 2020-03-31: 20 mL

## 2020-03-31 MED ORDER — ORAL CARE MOUTH RINSE: 15.0000 mL | Freq: Once | OROMUCOSAL | Status: AC

## 2020-03-31 MED ORDER — HYDROCODONE-ACETAMINOPHEN 5-325 MG PO TABS
ORAL_TABLET | ORAL | Status: AC
  Filled 2020-03-31: qty 1

## 2020-03-31 MED ORDER — ONDANSETRON HCL 4 MG/2ML IJ SOLN: 4.0000 mg | Freq: Once | INTRAMUSCULAR | Status: DC | PRN

## 2020-03-31 MED ORDER — REMIFENTANIL HCL 1 MG IV SOLR
INTRAVENOUS | Status: DC | PRN
  Administered 2020-03-31: .12 ug/kg/min via INTRAVENOUS

## 2020-03-31 MED ORDER — MIDAZOLAM HCL 2 MG/2ML IJ SOLN
INTRAMUSCULAR | Status: AC
  Filled 2020-03-31: qty 4

## 2020-03-31 MED ORDER — PHENYLEPHRINE HCL (PRESSORS) 10 MG/ML IV SOLN
INTRAVENOUS | Status: DC | PRN
  Administered 2020-03-31: 100 ug via INTRAVENOUS
  Administered 2020-03-31 (×4): 200 ug via INTRAVENOUS
  Administered 2020-03-31: 100 ug via INTRAVENOUS

## 2020-03-31 MED ORDER — FENTANYL CITRATE (PF) 100 MCG/2ML IJ SOLN
25.0000 ug | INTRAMUSCULAR | Status: DC | PRN
  Administered 2020-03-31 (×3): 25 ug via INTRAVENOUS

## 2020-03-31 MED ORDER — LIDOCAINE HCL (PF) 2 % IJ SOLN
INTRAMUSCULAR | Status: AC
  Filled 2020-03-31: qty 5

## 2020-03-31 MED ORDER — CEFAZOLIN SODIUM-DEXTROSE 2-4 GM/100ML-% IV SOLN
2.0000 g | INTRAVENOUS | Status: AC
  Administered 2020-03-31: 2 g via INTRAVENOUS

## 2020-03-31 MED ORDER — FAMOTIDINE 20 MG PO TABS: 20.0000 mg | ORAL_TABLET | Freq: Once | ORAL | Status: AC

## 2020-03-31 MED ORDER — CHLORHEXIDINE GLUCONATE 0.12 % MT SOLN: 15.0000 mL | Freq: Once | OROMUCOSAL | Status: AC

## 2020-03-31 MED ORDER — KETAMINE HCL 50 MG/ML IJ SOLN
INTRAMUSCULAR | Status: DC | PRN
  Administered 2020-03-31: 50 mg via INTRAVENOUS

## 2020-03-31 MED ORDER — FAMOTIDINE 20 MG PO TABS
ORAL_TABLET | ORAL | Status: AC
  Administered 2020-03-31: 20 mg via ORAL
  Filled 2020-03-31: qty 1

## 2020-03-31 MED ORDER — SODIUM CHLORIDE 0.9 % IV SOLN
INTRAVENOUS | Status: DC | PRN
Start: 2020-03-31 — End: 2020-03-31

## 2020-03-31 MED ORDER — OXYCODONE HCL 5 MG PO TABS: 5.0000 mg | ORAL_TABLET | ORAL | 0 refills | Status: DC | PRN

## 2020-03-31 MED ORDER — DEXMEDETOMIDINE HCL IN NACL 200 MCG/50ML IV SOLN
INTRAVENOUS | Status: DC | PRN
Start: 2020-03-31 — End: 2020-03-31
  Administered 2020-03-31: 12 ug via INTRAVENOUS

## 2020-03-31 MED ORDER — DEXAMETHASONE SODIUM PHOSPHATE 10 MG/ML IJ SOLN
INTRAMUSCULAR | Status: AC
  Filled 2020-03-31: qty 1

## 2020-03-31 MED ORDER — GLYCOPYRROLATE 0.2 MG/ML IJ SOLN
INTRAMUSCULAR | Status: AC
  Filled 2020-03-31: qty 1

## 2020-03-31 MED ORDER — CHLORHEXIDINE GLUCONATE 0.12 % MT SOLN
OROMUCOSAL | Status: AC
  Administered 2020-03-31: 15 mL via OROMUCOSAL
  Filled 2020-03-31: qty 15

## 2020-03-31 MED ORDER — ONDANSETRON HCL 4 MG/2ML IJ SOLN
INTRAMUSCULAR | Status: DC | PRN
  Administered 2020-03-31: 4 mg via INTRAVENOUS

## 2020-03-31 MED ORDER — ROCURONIUM BROMIDE 10 MG/ML (PF) SYRINGE
PREFILLED_SYRINGE | INTRAVENOUS | Status: AC
  Filled 2020-03-31: qty 10

## 2020-03-31 MED ORDER — KETAMINE HCL 50 MG/ML IJ SOLN
INTRAMUSCULAR | Status: AC
  Filled 2020-03-31: qty 10

## 2020-03-31 MED ORDER — SODIUM CHLORIDE 0.9 % IV SOLN
INTRAVENOUS | Status: DC | PRN
  Administered 2020-03-31: 50 ug/min via INTRAVENOUS

## 2020-03-31 MED ORDER — VANCOMYCIN HCL 1 G IV SOLR
INTRAVENOUS | Status: DC | PRN
  Administered 2020-03-31: 1000 mg

## 2020-03-31 MED ORDER — GLYCOPYRROLATE 0.2 MG/ML IJ SOLN
INTRAMUSCULAR | Status: DC | PRN
  Administered 2020-03-31: .2 mg via INTRAVENOUS

## 2020-03-31 SURGICAL SUPPLY — 79 items
BUR NEURO DRILL SOFT 3.0X3.8M (BURR) ×3 IMPLANT
CANISTER SUCT 1200ML W/VALVE (MISCELLANEOUS) ×3 IMPLANT
CHLORAPREP W/TINT 26 (MISCELLANEOUS) ×6 IMPLANT
COUNTER NEEDLE 20/40 LG (NEEDLE) ×3 IMPLANT
COVER LIGHT HANDLE STERIS (MISCELLANEOUS) ×6 IMPLANT
COVER WAND RF STERILE (DRAPES) ×3 IMPLANT
CRADLE LAMINECT ARM (MISCELLANEOUS) ×3 IMPLANT
CUP MEDICINE 2OZ PLAST GRAD ST (MISCELLANEOUS) ×6 IMPLANT
DERMABOND ADVANCED (GAUZE/BANDAGES/DRESSINGS) ×1
DERMABOND ADVANCED .7 DNX12 (GAUZE/BANDAGES/DRESSINGS) ×2 IMPLANT
DEVICE IMPLANT NEUROSTIMULATOR (Neuro Prosthesis/Implant) ×3 IMPLANT
DRAPE C-ARM 42X72 X-RAY (DRAPES) ×6 IMPLANT
DRAPE C-ARMOR (DRAPES) IMPLANT
DRAPE INCISE IOBAN 66X45 STRL (DRAPES) ×3 IMPLANT
DRAPE LAPAROTOMY 77X122 PED (DRAPES) ×3 IMPLANT
DRAPE SURG 17X11 SM STRL (DRAPES) ×3 IMPLANT
DRAPE THYROID T SHEET (DRAPES) ×3 IMPLANT
DRSG OPSITE POSTOP 4X6 (GAUZE/BANDAGES/DRESSINGS) IMPLANT
DRSG OPSITE POSTOP 4X8 (GAUZE/BANDAGES/DRESSINGS) IMPLANT
DRSG TEGADERM 4X4.75 (GAUZE/BANDAGES/DRESSINGS) IMPLANT
DRSG TELFA 4X3 1S NADH ST (GAUZE/BANDAGES/DRESSINGS) IMPLANT
ELECT CAUTERY BLADE TIP 2.5 (TIP) ×3
ELECT REM PT RETURN 9FT ADLT (ELECTROSURGICAL) ×3
ELECTRODE CAUTERY BLDE TIP 2.5 (TIP) ×2 IMPLANT
ELECTRODE REM PT RTRN 9FT ADLT (ELECTROSURGICAL) ×2 IMPLANT
ENVELOPE ABSORB ANTIBACTERIAL (Neuro Prosthesis/Implant) ×3 IMPLANT
FEE INTRAOP MONITOR IMPULS NCS (MISCELLANEOUS) ×2 IMPLANT
GAUZE SPONGE 4X4 12PLY STRL (GAUZE/BANDAGES/DRESSINGS) ×3 IMPLANT
GLOVE BIOGEL PI IND STRL 7.0 (GLOVE) ×2 IMPLANT
GLOVE BIOGEL PI INDICATOR 7.0 (GLOVE) ×1
GLOVE INDICATOR 8.0 STRL GRN (GLOVE) ×3 IMPLANT
GLOVE SURG SYN 7.0 (GLOVE) ×6 IMPLANT
GLOVE SURG SYN 8.0 (GLOVE) ×6 IMPLANT
GOWN STRL REUS W/ TWL LRG LVL3 (GOWN DISPOSABLE) ×2 IMPLANT
GOWN STRL REUS W/ TWL XL LVL3 (GOWN DISPOSABLE) ×4 IMPLANT
GOWN STRL REUS W/TWL LRG LVL3 (GOWN DISPOSABLE) ×2
GOWN STRL REUS W/TWL XL LVL3 (GOWN DISPOSABLE) ×2
GRADUATE 1200CC STRL 31836 (MISCELLANEOUS) ×3 IMPLANT
HEMOSTAT SURGICEL 2X3 (HEMOSTASIS) ×3 IMPLANT
INTRAOP MONITOR FEE IMPULS NCS (MISCELLANEOUS) ×2
INTRAOP MONITOR FEE IMPULSE (MISCELLANEOUS) ×2
IV CATH ANGIO 12GX3 LT BLUE (NEEDLE) ×3 IMPLANT
KIT TURNOVER KIT A (KITS) ×3 IMPLANT
KIT WILSON FRAME (KITS) ×3 IMPLANT
LAPSAC SURG PACK 8X10 (MISCELLANEOUS) ×3
LEAD NEURO 75CM CPT VE (Lead) ×4 IMPLANT
MARKER SKIN DUAL TIP RULER LAB (MISCELLANEOUS) ×3 IMPLANT
NEEDLE HYPO 22GX1.5 SAFETY (NEEDLE) ×3 IMPLANT
NEURO LEAD 75CM CPT  VE (Lead) ×4 IMPLANT
NEURO LEAD 75CM CPT VE (Lead) ×4 IMPLANT
NS IRRIG 1000ML POUR BTL (IV SOLUTION) ×3 IMPLANT
PACK LAMINECTOMY NEURO (CUSTOM PROCEDURE TRAY) ×3 IMPLANT
PACK SURG LAPSAC 8X10 (MISCELLANEOUS) ×2 IMPLANT
PAD ARMBOARD 7.5X6 YLW CONV (MISCELLANEOUS) ×3 IMPLANT
PIN MAYFIELD SKULL DISP (PIN) ×3 IMPLANT
RECHARGER INTELLIS (NEUROSURGERY SUPPLIES) ×3 IMPLANT
REPROGRAMMER INTELLIS (NEUROSURGERY SUPPLIES) ×3 IMPLANT
SPOGE SURGIFLO 8M (HEMOSTASIS) ×3
SPONGE KITTNER 5P (MISCELLANEOUS) ×3 IMPLANT
SPONGE SURGIFLO 8M (HEMOSTASIS) ×2 IMPLANT
STAPLER SKIN PROX 35W (STAPLE) ×3 IMPLANT
SUT BONE WAX W31G (SUTURE) ×3 IMPLANT
SUT ETHILON 3 0 PS 1 (SUTURE) ×3 IMPLANT
SUT ETHILON 3-0 FS-10 30 BLK (SUTURE)
SUT NURALON 4 0 TR CR/8 (SUTURE) ×3 IMPLANT
SUT POLYSORB 2-0 5X18 GS-10 (SUTURE) ×9 IMPLANT
SUT PROLENE 5 0 RB 2 (SUTURE) IMPLANT
SUT SILK 2 0 PERMA HAND 18 BK (SUTURE) IMPLANT
SUT SILK 2 0 SH (SUTURE) ×3 IMPLANT
SUT VIC AB 0 CT1 18XCR BRD 8 (SUTURE) ×2 IMPLANT
SUT VIC AB 0 CT1 8-18 (SUTURE) ×1
SUT VICRYL 2-0 SH 8X27 (SUTURE) ×3 IMPLANT
SUTURE EHLN 3-0 FS-10 30 BLK (SUTURE) IMPLANT
SYR 30ML LL (SYRINGE) ×3 IMPLANT
SYR BULB IRRIG 60ML STRL (SYRINGE) ×3 IMPLANT
TAPE CLOTH 3X10 WHT NS LF (GAUZE/BANDAGES/DRESSINGS) ×3 IMPLANT
TOWEL OR 17X26 4PK STRL BLUE (TOWEL DISPOSABLE) ×6 IMPLANT
TRAY FOLEY MTR SLVR 16FR STAT (SET/KITS/TRAYS/PACK) IMPLANT
TUBING CONNECTING 10 (TUBING) ×3 IMPLANT

## 2020-03-31 NOTE — Op Note (Signed)
Operative Note  SURGERY DATE:03/31/2020  PRE-OP DIAGNOSIS: Chronic pain syndrome  POST-OP DIAGNOSIS:Post-Op Diagnosis Codes: Chronic pain syndrome  Procedure(s) with comments: Bilateral percutaneous spinal cord stimulator lead placement in cervical area Left flank pulse generator  SURGEON:  * Malen Gauze, MD Lonell Face - assistant   ANESTHESIA:IV analgesic  OPERATIVE FINDINGS:Successful placement of cervical spinal cord stimulatorleads and left flank pulse generator  Indication Ms.  Harrisonwas seen in clinic on6/2 with ongoing neck and arm pain. MRI of the spine revealed no concerning stenosis at the level of the implant and she had had a prior anterior cervical discectomy and fusion. The patient underwent a spinal cord stimulator trial recently and had great relief of her pain. She therefore wanted to proceed with the permanent implant.Risks including weakness, hematoma, infection, failure of pain relief, post-operative pain, stroke, heart attack, pneumonia, and spinal cord injury were discussed.We discussed using neuro monitoring to prevent any neurologic deficits.   Procedure The patient was brought to the operating room where vascular access was obtained andshe was intubated by the anesthesia service. Neuro monitoring electrodes were placed and baseline MEP's and SSEPs were normal. She wasplaced prone on gel rolls with her neck slightly flexed on a foam pillow. Antibiotics were given.Fluoroscopy was used to confirm planned incision in mid thoracic area above her prior SCS paddle placement. In addition, an incision was planned in the left flank for the pulse generator placement. The patient was prepped and draped in a sterile fashion. A hard time out was performed. Local anesthetic was instilled intoplannedincisionsites.   The midline thoracic incision was opened and taken to the fascia using cautery.Next, a Touhy needle was used  to insert in a paramedian approach to enter the interlaminar space rostrally. Once loss of resistance was identified, this was confirmed with a metal stylette. Next, the percutaneous lead was passed in the rostral direction using fluoroscopy as guidance to keep in the midline. This was passed without resistance to the level of the C2 vertebral body. Lateral views were obtained to confirm we were in the dorsal epidural space. Next, the same procedure was performed contralaterally placing an additional lead in the midline at the C3 vertebral body, slightly offset to the previous lead to allow for better coverage.We used the programmerand monitoringto ensure these were covering the patients areas of painin the upper extremities and we were getting bilateral coverage.The leads were secured with anchors in the fascia. The incision was irrigated and hemostasis obtained  Next, the left flank incision was opened and taken to the fascia and inferior dissection to allow for a large enough pocket for the placement of the battery. The incision was irrigated and hemostasis obtained. Next, a tunneler was used to pass the electrode leads from the lumbar incision to the left flank. There, the electrodes were attached to the pulse generator and impedances were found to be normal. The pulse generator was placed into the antibiotic pouch and then placed into the incision taking care to place the wires beneath the pulse generator.  A final fluoroscopic image was taken showing good placement ofpercutaneous leads.Vancomycin powder was placed into the incisions. Next, a combination of 0, and 2-0Vicryls were used to close the incisions with 3-0 nylon on the skin.  Sterile dressings were applied. The patient was returned to supine positionand the patient was seen to be moving all extremities symmetrically and was taken to PACU for recovery. The family was updated and all questions answered.   ESTIMATED  BLOOD LOSS: 30cc  SPECIMENS None  IMPLANT NEURO LEAD 75CM CPT VE - [id number]  Inventory Item: NEURO LEAD 75CM CPT VE Serial no.:  Model/Cat no.: [id number]  Implant name: NEURO LEAD 75CM CPT VE - [id number] Laterality:  Area: Spine Cervical  Manufacturer: MED[employer] NEUROMOD PAIN MGMT Date of Manufacture:    Action: Implanted Number Used: 1   Device Identifier:  Device Identifier Type:    NEURO LEAD 75CM CPT VE - [id number]  Inventory Item: NEURO LEAD 75CM CPT VE Serial no.:  Model/Cat no.: [id number]  Implant name: NEURO LEAD 75CM CPT VE - [id number] Laterality:  Area: Spine Cervical  Manufacturer: [employer] NEUROMOD PAIN MGMT Date of Manufacture:    Action: Implanted Number Used: 1   Device Identifier:  Device Identifier Type:    DEVICE Lucillie Garfinkel [id number]  Inventory Item: DEVICE IMPLANT NEUROSTIMINATOR Serial no.: [id number] Model/Cat no.: [id number]  Implant name: DEVICE Lucillie Garfinkel [id number] Laterality:  Area: Spine Cervical  Manufacturer: [employer]TR[employer] NEUROMOD PAIN MGMT Date of Manufacture:    Action: Implanted Number Used: 1   Device Identifier:  Device Identifier Type:    ENVELOPE ABSORB ANTIBACTERIAL - [id number]  Inventory Item: ENVELOPE ABSORB ANTIBACTERIAL Serial no.:  Model/Cat no.: [id number]  Implant name: ENVELOPE ABSORB ANTIBACTERIAL - [id number] Laterality:  Area: Spine Cervical  Manufacturer: [employer] [place] [employer] Date of Manufacture:    Action: Implanted Number Used: 1   Device Identifier:  Device Identifier Type:         I performed the case in its entiretywithassistance of Lonell Face, NP  Deetta Perla, [place]

## 2020-03-31 NOTE — Discharge Instructions (Signed)
NEUROSURGERY DISCHARGE INSTRUCTIONS  PLEASE HOLD YOUR BABY ASPIRIN FOR 7 DAYS POST-OPERATIVELY PLEASE WEAR SOFT CERVICAL COLLAR UNTIL YOUR POST OP VISIT  The following are instructions to help in your recovery once you have been discharged from the hospital. Even if you feel well, it is important that you follow these activity guidelines.  What to do after you leave the hospital:  Recommended diet:  Increase protein intake to promote wound healing. You may return to your usual diet. However, you may experience discomfort when swallowing in the first month after your surgery. This is normal. You may find that softer foods are more comfortable for you to swallow. Be sure to stay hydrated.   Recommended activity: No bending, lifting, or twisting ("BLT"). Avoid lifting objects heavier than 10 pounds (gallon milk jug). Where possible, avoid household activities that involve lifting, bending, reaching, pushing, or pulling such as laundry, vacuuming, grocery shopping, and childcare. Try to arrange for help from friends and family for these activities while you heal.   Increase physical activity slowly as tolerated. Taking short walks is encouraged, but avoid strenuous exercise. Do not jog, run, bicycle, lift weights, or participate in any other exercises unless specifically allowed by your doctor.   You should not drive until cleared by your doctor.   Until released by your doctor, you should not return to work or school. You should rest at home and let your body heal.   You may shower the day after your surgery. After showering, lightly dab your incision dry. Do not take a tub bath or go swimming until approved by your doctor at your follow-up appointment.   If you smoke, we strongly recommend that you quit. Smoking has been proven to interfere with normal bone healing and will dramatically reduce the success rate of your surgery. Please contact [facility] (800-QUIT-NOW) and use the resources at  www.[facility].com for assistance in stopping smoking.   Medications  Do not restart Aspirin until seven days after surgery  * Do not take anti-inflammatory medications for 3 days after surgery (naproxen [Aleve], ibuprofen [Advil, Motrin], celecoxib [Celebrex], etc.).   You may restart home medications.   Wound Care Instructions  If you have a dressing on your incision, remove it two days after your surgery. Keep your incision area clean and dry.   If you have staples or stitches on your incision, you should have a follow up scheduled for removal. If you do not have staples or stitches, you will have steri-strips (small pieces of surgical tape) or Dermabond glue. The steri-strips/glue should begin to peel away within about a week (it is fine if the steri-strips fall off before then). If the strips are still in place one week after your surgery, you may gently remove them.    Please Report any of the following: Should you experience any of the following, contact us immediately:   New numbness or weakness   Pain that is progressively getting worse, and is not relieved by your pain medication, muscle relaxers, rest, and warm compresses   Bleeding, redness, swelling, pain, or drainage from surgical incision   Chills or flu-like symptoms   Fever greater than 101.0 F (38.3 C)   Inability to eat, drink fluids, or take medications   Problems with bowel or bladder functions   Difficulty breathing or shortness of breath   Warmth, tenderness, or swelling in your calf    Additional Follow up appointments During office hours (Monday-Friday 9 am to 5 pm), please call your  physician at [phone] and ask for Abigail [name].   After hours and weekends, please call [phone] and an answering service will put you in touch with either Dr. Lacinda Axon or Dr. Izora Ribas.   For a life-threatening emergency, call [facility]   1) The drugs that you were  given will stay in your system until tomorrow so for the next 24 hours you should not:  A) Drive an automobile B) Make any legal decisions C) Drink any alcoholic beverage   2) You may resume regular meals tomorrow.  Today it is better to start with liquids and gradually work up to solid foods.  You may eat anything you prefer, but it is better to start with liquids, then soup and crackers, and gradually work up to solid foods.   3) Please notify your doctor immediately if you have any unusual bleeding, trouble breathing, redness and pain at the surgery site, drainage, fever, or pain not relieved by medication.    4) Additional Instructions:        Please contact your physician with any problems or Same Day Surgery at [phone], Monday through Friday 6 am to 4 pm, or [place] at [facility] number at [phone].

## 2020-03-31 NOTE — Interval H&P Note (Signed)
History and Physical Interval Note:  03/31/2020 8:12 AM  Abigail [name]  has presented today for surgery, with the diagnosis of g89.4 chronic pain.  The various methods of treatment have been discussed with the patient and family. After consideration of risks, benefits and other options for treatment, the patient has consented to  Procedure(s): INSERTION CERVICAL SPINAL STIMULATOR (N/A) PLACEMENT OF LEFT FLANK PULSE GENERATOR (Left) as a surgical intervention.  The patient's history has been reviewed, patient examined, no change in status, stable for surgery.  I have reviewed the patient's chart and labs.  Questions were answered to the patient's satisfaction.     Deetta Perla

## 2020-03-31 NOTE — Anesthesia Postprocedure Evaluation (Signed)
Anesthesia Post Note  Patient: Abigail [name]  Procedure(s) Performed: INSERTION CERVICAL SPINAL STIMULATOR (N/A ) PLACEMENT OF LEFT FLANK PULSE GENERATOR (Left )  Patient location during evaluation: PACU Anesthesia Type: General Level of consciousness: awake and alert Pain management: pain level controlled Vital Signs Assessment: post-procedure vital signs reviewed and stable Respiratory status: spontaneous breathing, nonlabored ventilation, respiratory function stable and patient connected to nasal cannula oxygen Cardiovascular status: blood pressure returned to baseline and stable Postop Assessment: no apparent nausea or vomiting Anesthetic complications: no   No complications documented.   Last Vitals:  Vitals:   03/31/20 1202 03/31/20 1217  BP: 124/90 117/83  Pulse: 77 60  Resp: 13 14  Temp:  (!) 36.3 C  SpO2: 100% (!) 81%    Last Pain:  Vitals:   03/31/20 1217  TempSrc:   PainSc: 4                  Arita Miss

## 2020-03-31 NOTE — H&P (Signed)
Abigail [name] is an 54 y.o. female.   Chief Complaint: Neck and arm pain HPI: [name] is here for evaluation of ongoing pain in the arms and neck for which she underwent a recent spinal cord stimulator trial in the cervical spine. She does state that she got significant relief of her pain, almost 100% in the neck and proximal arms. She states that the pain is back to a 9 out of 10 once the stimulator trial was removed. She does not report any other medical changes. She does not report any new numbness. She is here to discuss permanent stimulator placement.  She has had a thoracic spinal cord stimulator placed previously with good results.   Past Medical History:  Diagnosis Date  . Adrenal cortex insufficiency ([place])   . Anxiety   . Arthritis    arthritis-back, weakness in right leg  . Asthma   . Chronic kidney disease    STAGE iiiA  . Complication of anesthesia    migraine headache when come out of anesthesia  . Depression   . Family history of adverse reaction to anesthesia    mom n/v  . Headache(784.0)    occ. migraines, not frequent  . Hypertension   . Lumbar radiculopathy   . Myofascial pain syndrome, cervical   . Occasional tremors    takes Inderal for this  . PONV (postoperative nausea and vomiting)    states patch works  . Syncope     Past Surgical History:  Procedure Laterality Date  . ABDOMINAL HYSTERECTOMY  14 yrs ago  . ANTERIOR CERVICAL DECOMPRESSION/DISCECTOMY FUSION 4 LEVELS N/A 10/18/2018   Procedure: ANTERIOR CERVICAL DECOMPRESSION/DISCECTOMY FUSION C 2-5;  Surgeon: Meade Maw, MD;  Location: [facility] ORS;  Service: Neurosurgery;  Laterality: N/A;  . BACK SURGERY     x 3.  Plate in neck  . BREAST BIOPSY Left 04/10/2015   BENIGN BREAST TISSUE  . CHOLECYSTECTOMY    . EYE SURGERY     lasik  . HEMI-MICRODISCECTOMY LUMBAR LAMINECTOMY LEVEL 1 N/A 09/28/2013   Procedure: HEMI-MICRODISCECTOMY LUMBAR LAMINECTOMY L5-S1 LEFT;  Surgeon: Tobi Bastos, MD;   Location: [facility];  Service: Orthopedics;  Laterality: N/A;  . KNEE ARTHROSCOPY WITH LATERAL RELEASE Left 01/03/2020   Procedure: KNEE ARTHROSCOPY WITH LATERAL RELEASE FOR PATELLA SUBLUXATION;  Surgeon: Hessie Knows, MD;  Location: [facility];  Service: Orthopedics;  Laterality: Left;  . KNEE SURGERY Left    dr. Holley Raring performed procedure. dr. Rudene Christians then did an arthroscopy  . left foot bone spur surgery  10 yrs ago  . LUMBAR LAMINECTOMY/DECOMPRESSION MICRODISCECTOMY  07/13/2012   Procedure: LUMBAR LAMINECTOMY/DECOMPRESSION MICRODISCECTOMY 1 LEVEL;  Surgeon: Tobi Bastos, MD;  Location: [facility];  Service: Orthopedics;  Laterality: N/A;  Central Decompression Lumbar Laminectomy L5 - S1 (X-Ray)  . LUMBAR LAMINECTOMY/DECOMPRESSION MICRODISCECTOMY Right 03/07/2013   Procedure: LUMBAR DECOMPRESSIVE HEMI LAMINECTOMY AND  MICRODISCECTOMY  L5-S1 RIGHT ;  Surgeon: Tobi Bastos, MD;  Location: [facility];  Service: Orthopedics;  Laterality: Right;  . SPINAL CORD STIMULATOR INSERTION N/A 11/28/2019   Procedure: LUMBAR SPINAL CORD STIMULATOR INSERTION;  Surgeon: Meade Maw, MD;  Location: [facility];  Service: Neurosurgery;  Laterality: N/A;    Family History  Problem Relation Age of Onset  . Ataxia Neg Hx   . Chorea Neg Hx   . Dementia Neg Hx   . Mental retardation Neg Hx   . Migraines Neg Hx   . Multiple sclerosis Neg Hx   . Neurofibromatosis  Neg Hx   . Neuropathy Neg Hx   . Parkinsonism Neg Hx   . Seizures Neg Hx   . Stroke Neg Hx    Social History:  reports that she has been smoking cigarettes. She has a 10.50 pack-year smoking history. She has never used smokeless tobacco. She reports previous alcohol use. She reports current drug use. Frequency: 7.00 times per week. Drug: Marijuana.  Allergies: Not on File  Medications Prior to Admission  Medication Sig Dispense Refill  . acetaminophen (TYLENOL) 500 MG tablet Take 500 mg by mouth every 6 (six) hours as needed for moderate pain or  headache.     Marland Kitchen amLODipine (NORVASC) 5 MG tablet Take 5 mg by mouth daily.    Marland Kitchen atorvastatin (LIPITOR) 20 MG tablet Take 20 mg by mouth daily.     Marland Kitchen BREO ELLIPTA 100-25 MCG/INH AEPB Inhale 1 puff into the lungs every evening.    . celecoxib (CELEBREX) 200 MG capsule Take 200 mg by mouth 2 (two) times daily.    . Cholecalciferol (VITAMIN D-1000 MAX ST) 25 MCG (1000 UT) tablet Take 1,000 Units by mouth daily.    . clonazePAM (KLONOPIN) 0.5 MG tablet Take 0.25 mg by mouth 3 (three) times daily as needed for anxiety.     . DULoxetine (CYMBALTA) 60 MG capsule Take 60 mg by mouth 2 (two) times daily.    Marland Kitchen HYDROcodone-acetaminophen (NORCO/VICODIN) 5-325 MG tablet Take 1 tablet by mouth every 6 (six) hours as needed for pain.    . meclizine (ANTIVERT) 12.5 MG tablet Take 12.5 mg by mouth daily. TAKES DAILY IN THE AFTERNOON    . mirabegron ER (MYRBETRIQ) 25 MG TB24 tablet Take 25 mg by mouth daily. TAKES IN THE MORNING    . nortriptyline (PAMELOR) 50 MG capsule Take 100 mg by mouth at bedtime.     Marland Kitchen olmesartan (BENICAR) 20 MG tablet Take 20 mg by mouth daily.    . predniSONE (DELTASONE) 5 MG tablet Take 5 mg by mouth daily with breakfast.     . pregabalin (LYRICA) 75 MG capsule Take 75 mg by mouth 3 (three) times daily.    . propranolol (INDERAL) 20 MG tablet Take 20 mg by mouth 2 (two) times daily.     . rizatriptan (MAXALT-MLT) 10 MG disintegrating tablet Take 10 mg by mouth as needed for migraine.     Marland Kitchen tiZANidine (ZANAFLEX) 2 MG tablet Take 1 tablet (2 mg total) by mouth every 6 (six) hours as needed for muscle spasms. Morning, mid-afternoon, & evening. (Patient taking differently: Take 2 mg by mouth in the morning and at bedtime. ) 40 tablet 0  . topiramate (TOPAMAX) 25 MG tablet Take 50 mg by mouth at bedtime.    Marland Kitchen aspirin EC 81 MG tablet Take 81 mg by mouth daily.    Marland Kitchen BIOTIN PO Take 1 tablet by mouth daily at 12 noon.    Marland Kitchen oxyCODONE (ROXICODONE) 5 MG immediate release tablet Take 1 tablet (5 mg  total) by mouth every 4 (four) hours as needed for moderate pain, severe pain or breakthrough pain. (Patient not taking: Reported on 03/19/2020) 30 tablet 0    Results for orders placed or performed during the hospital encounter of 03/31/20 (from the past 48 hour(s))  Urine Drug Screen, Qualitative ([place] only)     Status: Abnormal   Collection Time: 03/31/20  7:28 AM  Result Value Ref Range   Tricyclic, Ur Screen POSITIVE (A) NONE DETECTED   Amphetamines, Ur  Screen NONE DETECTED NONE DETECTED   MDMA (Ecstasy)Ur Screen NONE DETECTED NONE DETECTED   Cocaine Metabolite,Ur [place] NONE DETECTED NONE DETECTED   Opiate, Ur Screen NONE DETECTED NONE DETECTED   Phencyclidine (PCP) Ur S NONE DETECTED NONE DETECTED   Cannabinoid 50 Ng, Ur  POSITIVE (A) NONE DETECTED   Barbiturates, Ur Screen NONE DETECTED NONE DETECTED   Benzodiazepine, Ur Scrn NONE DETECTED NONE DETECTED   Methadone Scn, Ur NONE DETECTED NONE DETECTED    Comment: (NOTE) Tricyclics + metabolites, urine    Cutoff 1000 ng/mL Amphetamines + metabolites, urine  Cutoff 1000 ng/mL MDMA (Ecstasy), urine              Cutoff 500 ng/mL Cocaine Metabolite, urine          Cutoff 300 ng/mL Opiate + metabolites, urine        Cutoff 300 ng/mL Phencyclidine (PCP), urine         Cutoff 25 ng/mL Cannabinoid, urine                 Cutoff 50 ng/mL Barbiturates + metabolites, urine  Cutoff 200 ng/mL Benzodiazepine, urine              Cutoff 200 ng/mL Methadone, urine                   Cutoff 300 ng/mL  The urine drug screen provides only a preliminary, unconfirmed analytical test result and should not be used for non-medical purposes. Clinical consideration and professional judgment should be applied to any positive drug screen result due to possible interfering substances. A more specific alternate chemical method must be used in order to obtain a confirmed analytical result. Gas chromatography / mass spectrometry (GC/MS) is the preferred confirm  atory method. Performed at [facility], [address] [place], [place] [id number]    No results found.  Review of Systems  General ROS: Negative Respiratory ROS: Negative Cardiovascular ROS: Negative Gastrointestinal ROS: Negative Genito-Urinary ROS: Negative Musculoskeletal ROS: Positive for neck pain Neurological ROS: Positive for bilateral arm pain Dermatological ROS: Negative   Blood pressure 106/76, pulse 78, temperature 98 F (36.7 C), temperature source Temporal, resp. rate 18, height 5\' 5"  (1.651 m), weight 76.7 kg, SpO2 98 %. Physical Exam  General appearance: Alert, cooperative, in no acute distress KF:[id number] rate and rhythm Pulm: Clear to auscultation Neck: Well-healed anterior cervical incision  Neurologic exam:  Mental status: alertness: alert, affect: normal Speech: fluent and clear Motor:strength symmetric 5/5 in bilateral upper and lower extremities Sensory: intact to light touch in all extremities Gait: normal   Imaging: MRI cervical spine: There is evidence of a previous C2-5 anterior cervical discectomy and fusion. There is some mild posterior ligament hypertrophy at C2-3 without any significant central stenosis throughout the cervical spine.   Assessment/Plan Plan for cervical SCS  Deetta Perla, MD 03/31/2020, 8:10 AM

## 2020-03-31 NOTE — Transfer of Care (Signed)
Immediate Anesthesia Transfer of Care Note  Patient: Abigail [name]  Procedure(s) Performed: INSERTION CERVICAL SPINAL STIMULATOR (N/A ) PLACEMENT OF LEFT FLANK PULSE GENERATOR (Left )  Patient Location: PACU  Anesthesia Type:General  Level of Consciousness: sedated  Airway & Oxygen Therapy: Patient Spontanous Breathing and Patient connected to face mask oxygen  Post-op Assessment: Report given to RN and Post -op Vital signs reviewed and stable  Post vital signs: Reviewed  Last Vitals:  Vitals Value Taken Time  BP    Temp    Pulse 87 03/31/20 1128  Resp    SpO2 98 % 03/31/20 1128  Vitals shown include unvalidated device data.  Last Pain:  Vitals:   03/31/20 0721  TempSrc: Temporal  PainSc: 9       Patients Stated Pain Goal: 0 (14/10/30 1314)  Complications: No complications documented.

## 2020-03-31 NOTE — Anesthesia Preprocedure Evaluation (Signed)
Anesthesia Evaluation  Patient identified by MRN, date of birth, ID band Patient awake    Reviewed: Allergy & Precautions, NPO status , Patient's Chart, lab work & pertinent test results  History of Anesthesia Complications (+) PONVNegative for: history of anesthetic complications  Airway Mallampati: III  TM Distance: >3 FB Neck ROM: Full    Dental no notable dental hx. (+) Teeth Intact, Dental Advisory Given   Pulmonary asthma , neg sleep apnea, neg COPD, Current SmokerPatient did not abstain from smoking.,    Pulmonary exam normal breath sounds clear to auscultation       Cardiovascular Exercise Tolerance: Good METShypertension, Pt. on medications (-) CAD and (-) Past MI (-) dysrhythmias  Rhythm:Regular Rate:Normal - Systolic murmurs    Neuro/Psych  Headaches, PSYCHIATRIC DISORDERS Anxiety Depression TIA   GI/Hepatic neg GERD  ,(+)     (-) substance abuse  ,   Endo/Other  neg diabetesAdrenal insufficiency  Renal/GU negative Renal ROS     Musculoskeletal  (+) Arthritis ,   Abdominal   Peds  Hematology   Anesthesia Other Findings Past Medical History: No date: Adrenal cortex insufficiency ([facility]) No date: Arthritis     Comment:  arthritis-back, weakness in right leg No date: Complication of anesthesia     Comment:  migraine headache when come out of anesthesia No date: Family history of adverse reaction to anesthesia     Comment:  mom n/v No date: Headache(784.0)     Comment:  occ. migraines, not frequent No date: Hypertension No date: PONV (postoperative nausea and vomiting)     Comment:  states patch works No date: Syncope  Reproductive/Obstetrics                             Anesthesia Physical  Anesthesia Plan  ASA: II  Anesthesia Plan: General   Post-op Pain Management:    Induction: Intravenous  PONV Risk Score and Plan: 4 or greater and Ondansetron, Dexamethasone,  Propofol infusion, TIVA, Midazolam and Scopolamine patch - Pre-op  Airway Management Planned: Oral ETT  Additional Equipment: None  Intra-op Plan:   Post-operative Plan: Extubation in OR  Informed Consent: I have reviewed the patients History and Physical, chart, labs and discussed the procedure including the risks, benefits and alternatives for the proposed anesthesia with the patient or authorized representative who has indicated his/her understanding and acceptance.     Dental advisory given  Plan Discussed with: CRNA and Surgeon  Anesthesia Plan Comments: (Discussed risks of anesthesia with patient, including PONV, sore throat, lip/dental damage. Rare risks discussed as well, such as cardiorespiratory sequelae. Patient understands. Discussed increased risk of respiratory issues perioperatively given smoking history.)        Anesthesia Quick Evaluation

## 2020-03-31 NOTE — Consult Note (Addendum)
Pharmacy Antibiotic Note  Abigail [name] is a 54 y.o. female admitted on 03/31/2020 for a scheduled procedure.  Pharmacy has been consulted for cefazolin dosing.  Plan: Administer cefazolin 2g IV x1 dose 30 mins prior to procedure     No data recorded.  Recent Labs  Lab 03/27/20 1120  WBC 14.8*  CREATININE 0.99    Estimated Creatinine Clearance: 68.9 mL/min (by C-G formula based on SCr of 0.99 mg/dL).    Not on File   Thank you for allowing pharmacy to be a part of this patient's care.  Durward Fortes  PharmD Student  03/31/2020 7:31 AM

## 2020-03-31 NOTE — Anesthesia Procedure Notes (Signed)
Procedure Name: Intubation Performed by: Rolla Plate, CRNA Pre-anesthesia Checklist: Patient identified, Patient being monitored, Timeout performed, Emergency Drugs available and Suction available Patient Re-evaluated:Patient Re-evaluated prior to induction Oxygen Delivery Method: Circle system utilized Preoxygenation: Pre-oxygenation with 100% oxygen Induction Type: IV induction Ventilation: Mask ventilation without difficulty Laryngoscope Size: 3 and McGraph Grade View: Grade I Tube type: Oral Tube size: 7.0 mm Number of attempts: 1 Airway Equipment and Method: Stylet,  Video-laryngoscopy,  LTA kit utilized and Bite block Placement Confirmation: ETT inserted through vocal cords under direct vision,  positive ETCO2 and breath sounds checked- equal and bilateral Secured at: 21 cm Tube secured with: Tape Dental Injury: Teeth and Oropharynx as per pre-operative assessment  Difficulty Due To: Difficult Airway-  due to neck instability, Difficulty was anticipated and Difficult Airway- due to limited oral opening Future Recommendations: Recommend- induction with short-acting agent, and alternative techniques readily available Comments: Gentle induction with neck and spine in neutral position. Soft bite blocks placed with lips lubricated

## 2020-03-31 NOTE — Discharge Summary (Addendum)
  Procedure: implantation of cervical SCS Procedure date: 03/31/2020 Diagnosis: chronic pain   History: Abigail [name] is s/p cervical SCS implantation POD0: Tolerated procedure well. Evaluated in post op recovery still disoriented from anesthesia but able to answer questions and obey commands.   Physical Exam: Vitals:   03/31/20 0721  BP: 106/76  Pulse: 78  Resp: 18  Temp: 98 F (36.7 C)  SpO2: 98%    General: Alert and oriented, lying in bed Strength:5/5 throughout  Sensation: intact and symmetric throughout  Skin: dressings clean dry intact  Data:  Recent Labs  Lab 03/27/20 1120  NA 137  K 4.2  CL 105  CO2 24  BUN 15  CREATININE 0.99  GLUCOSE 104*  CALCIUM 9.1   No results for input(s): AST, ALT, ALKPHOS in the last 168 hours.  Invalid input(s): TBILI   Recent Labs  Lab 03/27/20 1120  WBC 14.8*  HGB 15.5*  HCT 45.6  PLT 261   Recent Labs  Lab 03/27/20 1120  APTT 30  INR 1.0           Assessment/Plan:  Abigail [name] is POD0 s/p cervical SCS implantation. Once patient urinates, ambulates, and pain is controlled, she is acceptable for discharge home.    Lonell Face, NP Department of Neurosurgery

## 2020-04-01 ENCOUNTER — Encounter: Payer: Self-pay | Admitting: Neurosurgery

## 2020-05-28 DIAGNOSIS — E0789 Other specified disorders of thyroid: Secondary | ICD-10-CM | POA: Diagnosis not present

## 2020-06-03 DIAGNOSIS — R519 Headache, unspecified: Secondary | ICD-10-CM | POA: Diagnosis not present

## 2020-06-03 DIAGNOSIS — R42 Dizziness and giddiness: Secondary | ICD-10-CM | POA: Diagnosis not present

## 2020-06-03 DIAGNOSIS — R251 Tremor, unspecified: Secondary | ICD-10-CM | POA: Diagnosis not present

## 2020-06-04 ENCOUNTER — Other Ambulatory Visit: Payer: Self-pay | Admitting: Family Medicine

## 2020-06-04 DIAGNOSIS — Z1231 Encounter for screening mammogram for malignant neoplasm of breast: Secondary | ICD-10-CM

## 2020-06-04 DIAGNOSIS — E0789 Other specified disorders of thyroid: Secondary | ICD-10-CM | POA: Diagnosis not present

## 2020-06-04 DIAGNOSIS — E2740 Unspecified adrenocortical insufficiency: Secondary | ICD-10-CM | POA: Diagnosis not present

## 2020-06-24 ENCOUNTER — Other Ambulatory Visit: Payer: Self-pay

## 2020-06-24 ENCOUNTER — Ambulatory Visit
Admission: RE | Admit: 2020-06-24 | Discharge: 2020-06-24 | Disposition: A | Discharge status: Home / Self Care | Payer: Medicare HMO | Source: Ambulatory Visit | Attending: Family Medicine | Admitting: Family Medicine

## 2020-06-24 DIAGNOSIS — Z1231 Encounter for screening mammogram for malignant neoplasm of breast: Secondary | ICD-10-CM | POA: Diagnosis not present

## 2020-07-13 IMAGING — RF DG C-ARM 1-60 MIN
1 series · 6 of 6 positions shown · non-contrast
Comparison: None.

CLINICAL DATA: CERVICAL SPINE stimulator placement.

EXAM:
OPERATIVE CERVICAL TO SPINE 2 VIEW(S)

[Series 1: dg or · 0.14mm/px · 6 of 6 slices shown]
[im 1/6]
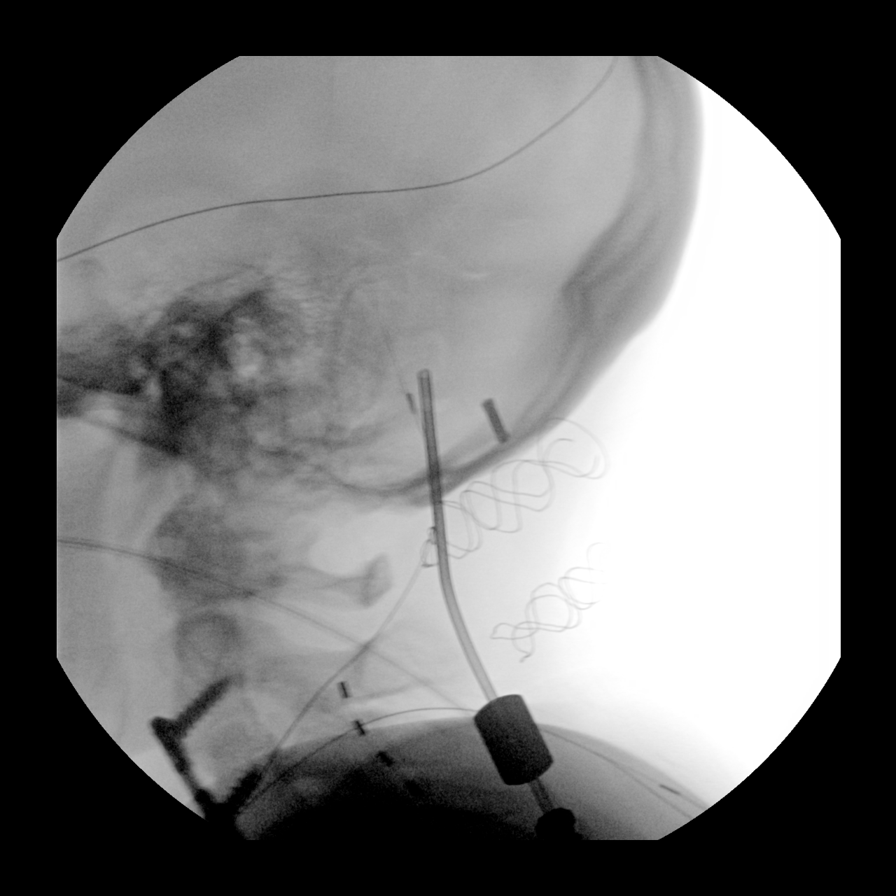
[im 2/6]
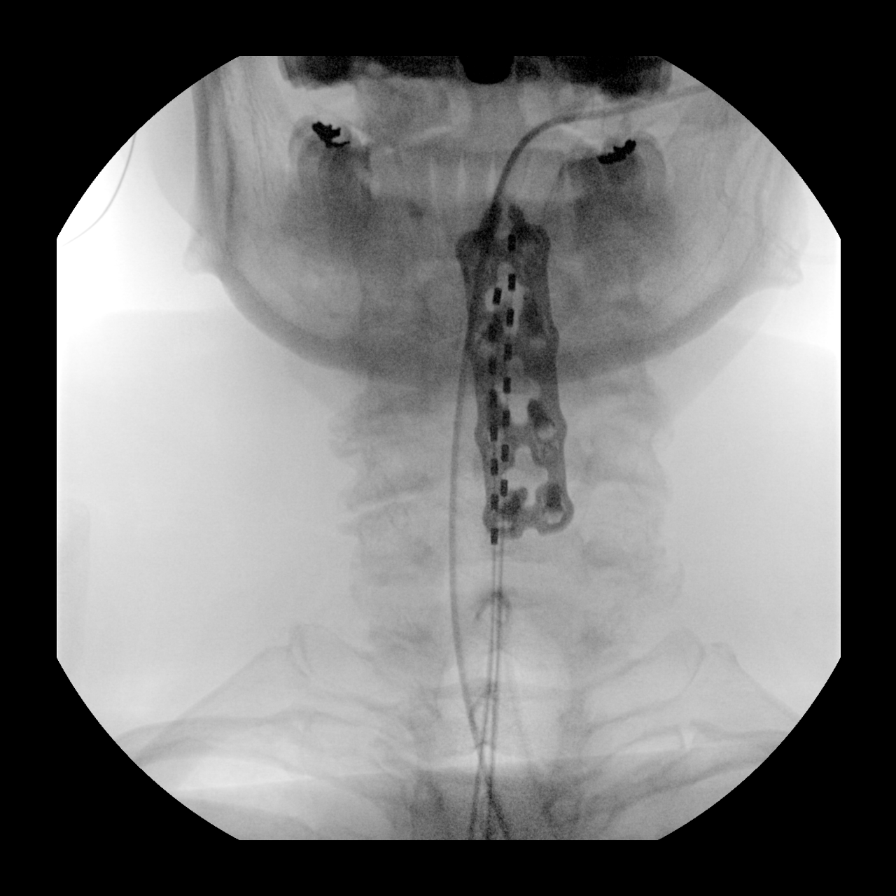
[im 3/6]
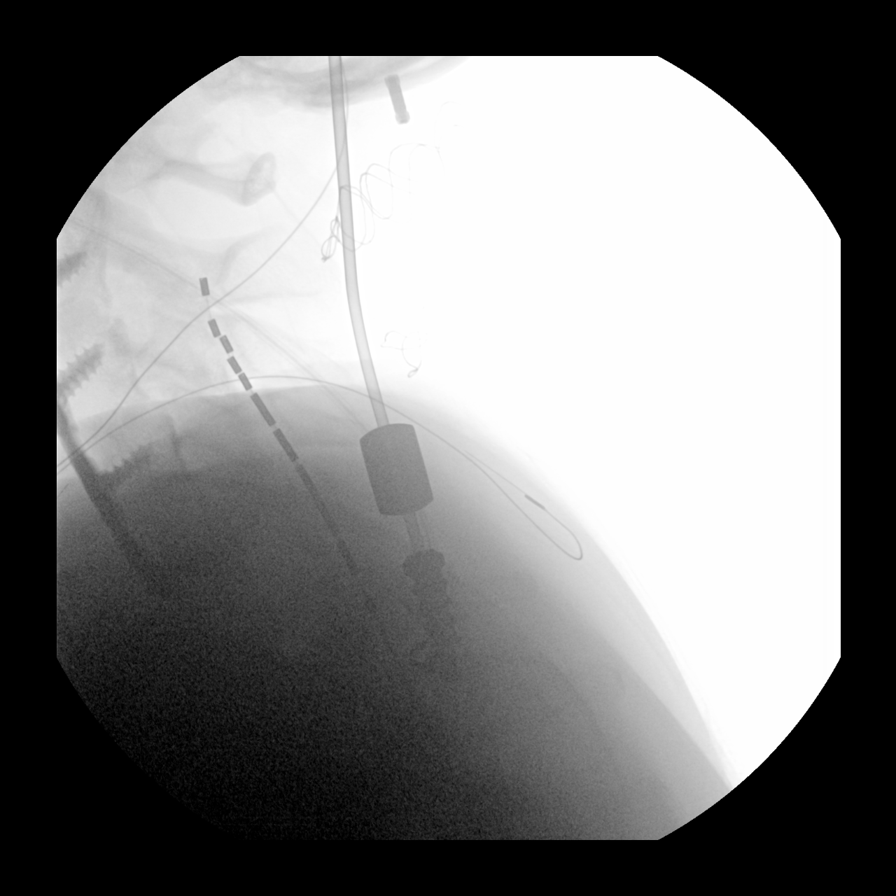
[im 4/6]
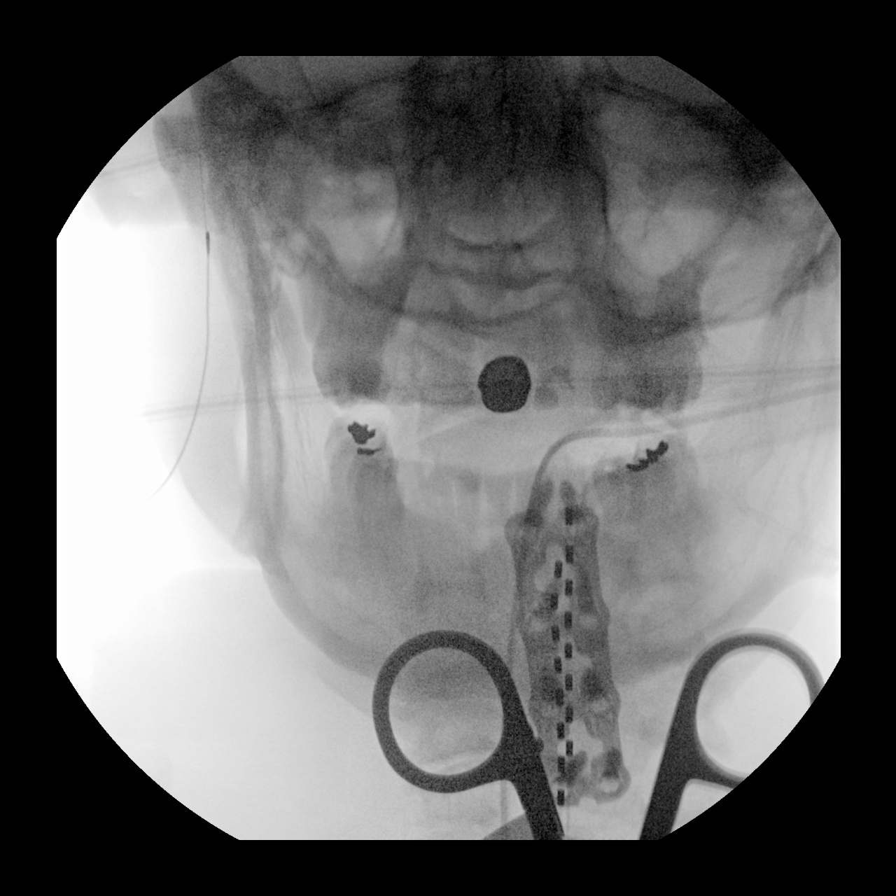
[im 5/6]
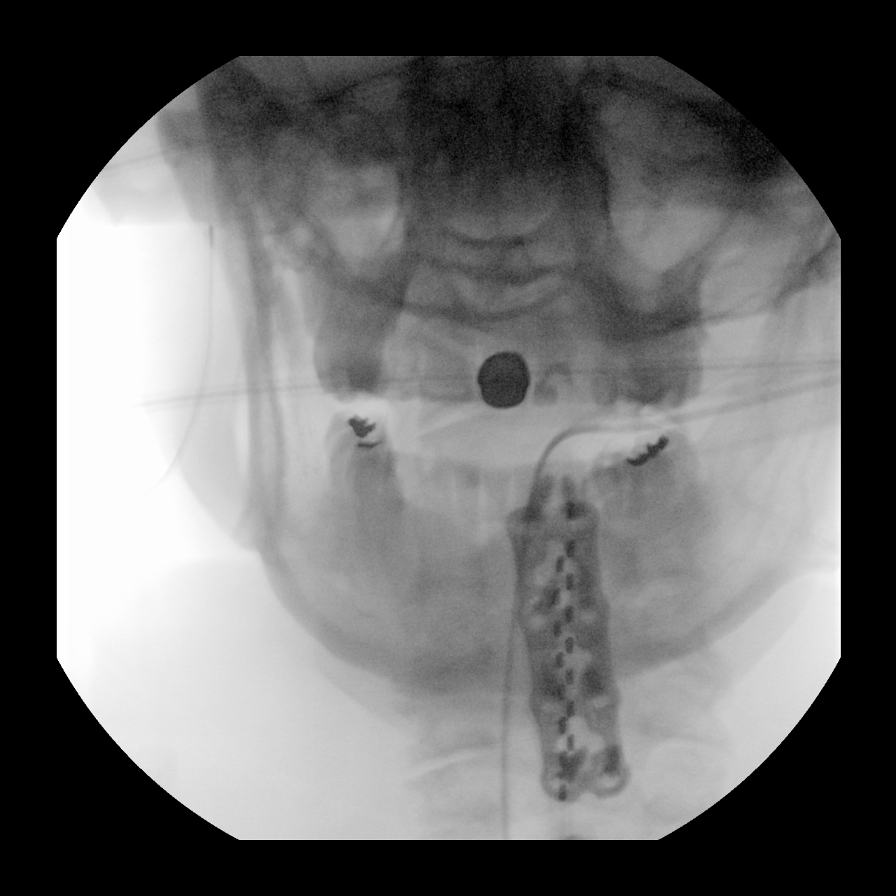
[im 6/6]
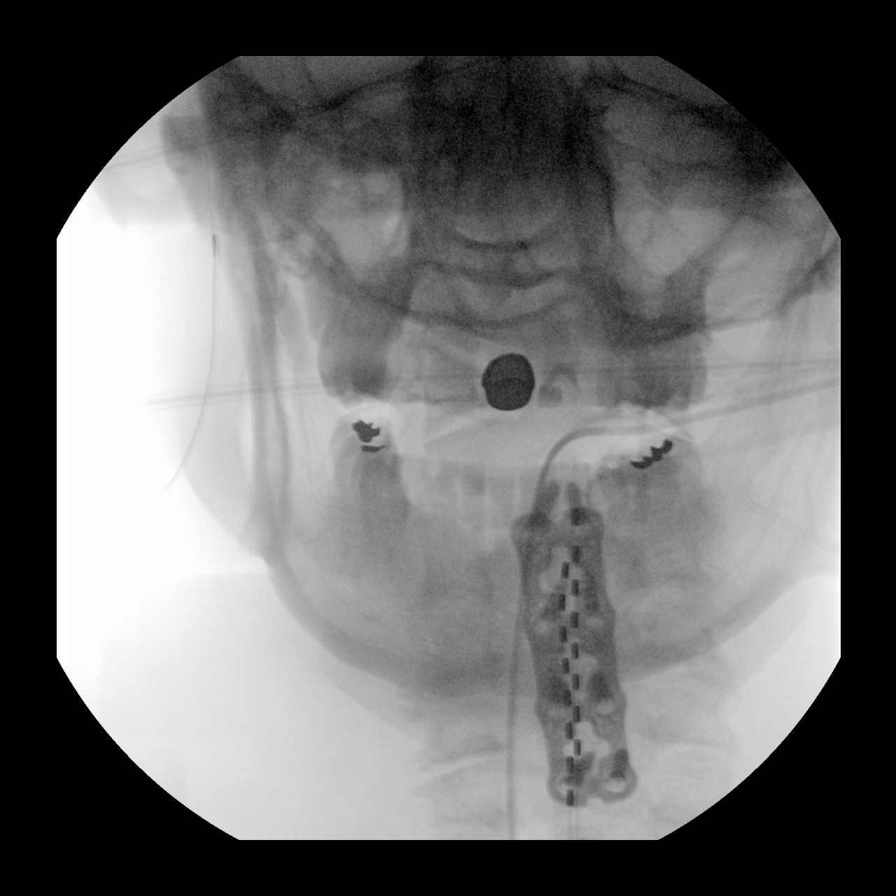

[6 of 6 positions shown; findings below may reference images not displayed]

FINDINGS: Six images from portable C-arm radiography obtained in the operating
room show dorsal column stimulator device placement with leads at
the C2 through C5 level where prior ACDF has been performed.
IMPRESSION: Status post dorsal spinal cord stimulator device placement from C2
through C5.

## 2020-07-21 DIAGNOSIS — M12811 Other specific arthropathies, not elsewhere classified, right shoulder: Secondary | ICD-10-CM | POA: Diagnosis not present

## 2020-07-21 DIAGNOSIS — M25511 Pain in right shoulder: Secondary | ICD-10-CM | POA: Diagnosis not present

## 2020-07-21 DIAGNOSIS — M75101 Unspecified rotator cuff tear or rupture of right shoulder, not specified as traumatic: Secondary | ICD-10-CM | POA: Diagnosis not present

## 2020-08-18 DIAGNOSIS — Z79899 Other long term (current) drug therapy: Secondary | ICD-10-CM | POA: Diagnosis not present

## 2020-08-18 DIAGNOSIS — E7800 Pure hypercholesterolemia, unspecified: Secondary | ICD-10-CM | POA: Diagnosis not present

## 2020-08-18 DIAGNOSIS — N1831 Chronic kidney disease, stage 3a: Secondary | ICD-10-CM | POA: Diagnosis not present

## 2020-08-25 DIAGNOSIS — Z0000 Encounter for general adult medical examination without abnormal findings: Secondary | ICD-10-CM | POA: Diagnosis not present

## 2020-08-25 DIAGNOSIS — E236 Other disorders of pituitary gland: Secondary | ICD-10-CM | POA: Diagnosis not present

## 2020-08-25 DIAGNOSIS — Z1331 Encounter for screening for depression: Secondary | ICD-10-CM | POA: Diagnosis not present

## 2020-08-25 DIAGNOSIS — E785 Hyperlipidemia, unspecified: Secondary | ICD-10-CM | POA: Diagnosis not present

## 2020-08-25 DIAGNOSIS — I129 Hypertensive chronic kidney disease with stage 1 through stage 4 chronic kidney disease, or unspecified chronic kidney disease: Secondary | ICD-10-CM | POA: Diagnosis not present

## 2020-08-25 DIAGNOSIS — Z79899 Other long term (current) drug therapy: Secondary | ICD-10-CM | POA: Diagnosis not present

## 2020-08-25 DIAGNOSIS — Z1211 Encounter for screening for malignant neoplasm of colon: Secondary | ICD-10-CM | POA: Diagnosis not present

## 2020-08-25 DIAGNOSIS — N183 Chronic kidney disease, stage 3 unspecified: Secondary | ICD-10-CM | POA: Diagnosis not present

## 2020-08-25 DIAGNOSIS — E271 Primary adrenocortical insufficiency: Secondary | ICD-10-CM | POA: Diagnosis not present

## 2020-08-29 ENCOUNTER — Encounter: Payer: Self-pay | Admitting: Student in an Organized Health Care Education/Training Program

## 2020-09-09 ENCOUNTER — Other Ambulatory Visit: Payer: Self-pay | Admitting: Orthopedic Surgery

## 2020-09-09 ENCOUNTER — Encounter: Payer: Self-pay | Admitting: Student in an Organized Health Care Education/Training Program

## 2020-09-09 DIAGNOSIS — M7581 Other shoulder lesions, right shoulder: Secondary | ICD-10-CM

## 2020-09-09 DIAGNOSIS — M12811 Other specific arthropathies, not elsewhere classified, right shoulder: Secondary | ICD-10-CM

## 2020-09-09 DIAGNOSIS — M75101 Unspecified rotator cuff tear or rupture of right shoulder, not specified as traumatic: Secondary | ICD-10-CM

## 2020-09-10 ENCOUNTER — Other Ambulatory Visit: Payer: Self-pay

## 2020-09-10 ENCOUNTER — Ambulatory Visit
Payer: Medicare HMO | Attending: Student in an Organized Health Care Education/Training Program | Admitting: Student in an Organized Health Care Education/Training Program

## 2020-09-10 DIAGNOSIS — G43709 Chronic migraine without aura, not intractable, without status migrainosus: Secondary | ICD-10-CM | POA: Diagnosis not present

## 2020-09-10 NOTE — Progress Notes (Signed)
Patient: Abigail [name]  Service Category: E/M  Provider: Gillis Santa, MD  DOB: 30-Jan-1966  DOS: 09/10/2020  Location: Office  MRN: [id number]  Setting: Ambulatory outpatient  Referring Provider: Derinda Late, MD  Type: Established Patient  Specialty: Interventional Pain Management  PCP: Abigail Late, MD  Location: Home  Delivery: TeleHealth     Virtual Encounter - Pain Management PROVIDER NOTE: Information contained herein reflects review and annotations entered in association with encounter. Interpretation of such information and data should be left to medically-trained personnel. Information provided to patient can be located elsewhere in the medical record under "Patient Instructions". Document created using STT-dictation technology, any transcriptional errors that may result from process are unintentional.    Contact & Pharmacy Preferred: [phone] Home: [phone] (home) Mobile: [phone] (mobile) E-mail: [email or web address]  [id number] [name] [place]Phone: [phone]: [phone]  Pre-screening  Abigail [name] "in-person" vs "virtual" encounter. She indicated preferring virtual for this encounter.   Reason COVID-19*  Social distancing based on CDC and AMA recommendations.   I contacted Abigail [name] 09/10/2020 via telephone.      I clearly identified myself as BGillis Santa MD. I verified that I was speaking with the correct person using two identifiers (Name: Abigail [name] and date of birth: [birth year].  Consent I sought verbal advanced consent from Abigail [name] virtual visit interactions. I informed Ms. HLivengoodof possible security and privacy concerns, risks, and limitations associated with providing "not-in-person" medical evaluation and management services. I also informed [name] the availability of "in-person" appointments. Finally, I informed her  that there would be a charge for the virtual visit and that she could be  personally, fully or partially, financially responsible for it. [name] understanding and agreed to proceed.   Historic Elements   Ms. WNATSHA GUIDRYis a 54y.o. year old, female patient evaluated today after our last contact on Visit date not found. Ms. HSumida has a past medical history of [name], Anxiety, Arthritis, Asthma, Chronic kidney disease, Complication of anesthesia, Depression, Family history of adverse reaction to anesthesia, Headache(784.0), Hypertension, Lumbar radiculopathy, Myofascial pain syndrome, cervical, Occasional tremors, PONV (postoperative nausea and vomiting), and Syncope. She also  has a past surgical history that includes left foot bone spur surgery (10 yrs ago); Abdominal hysterectomy (14 yrs ago); Lumbar laminectomy/decompression microdiscectomy (07/13/2012); Lumbar laminectomy/decompression microdiscectomy (Right, 03/07/2013); Hemi-microdiscectomy lumbar laminectomy level 1 (N/A, 09/28/2013); Cholecystectomy; Anterior cervical decompression/discectomy fusion 4 level (N/A, 10/18/2018); Knee surgery (Left); Spinal cord stimulator insertion (N/A, 11/28/2019); Breast biopsy (Left, 04/10/2015); Back surgery; Knee arthroscopy with lateral release (Left, 01/03/2020); Eye surgery; Spinal cord stimulator insertion (N/A, 03/31/2020); and Pulse generator implant (Left, 03/31/2020). [name] a current medication list which includes the following prescription(s): acetaminophen, amlodipine, atorvastatin, biotin, breo ellipta, cholecalciferol, clonazepam, duloxetine, meclizine, mirabegron er, nortriptyline, olmesartan, propranolol, rizatriptan, tizanidine, and topiramate. She  reports that she has been smoking cigarettes. She has a 10.50 pack-year smoking history. She has never used smokeless tobacco. She reports previous alcohol use. She reports current drug use. Frequency:  7.00 times per week. Drug: Marijuana. Ms. HSeifriedhas No Known Allergies.   HPI  Today, she is being contacted for new problems.   Abigail [name] of worsening migraines.  She was previously on Ajovy prescribed by neurology for her migraine management however insurance stopped covering it.  She is currently on Topamax, propranolol, nortriptyline,  Cymbalta along with Klonopin for overall pain management.  We discussed Botox therapy for refractory migraines.  Patient states that she will think about this further.  She states that she will discuss further with neurology and let us know if she would like to proceed with Botox therapy for migraine management.  Laboratory Chemistry Profile   Renal Lab Results  Component Value Date   BUN 15 03/27/2020   CREATININE 0.99 03/27/2020   BCR 14 12/26/2018   GFRAA >60 03/27/2020   GFRNONAA >60 03/27/2020     Hepatic Lab Results  Component Value Date   AST 13 12/26/2018   ALT 7 06/09/2014   ALBUMIN 4.4 12/26/2018   ALKPHOS 59 12/26/2018   LIPASE 91 03/21/2013     Electrolytes Lab Results  Component Value Date   NA 137 03/27/2020   K 4.2 03/27/2020   CL 105 03/27/2020   CALCIUM 9.1 03/27/2020   MG 2.1 12/26/2018     Bone Lab Results  Component Value Date   25OHVITD1 48 12/26/2018   25OHVITD2 2.7 12/26/2018   25OHVITD3 45 12/26/2018     Inflammation (CRP: Acute Phase) (ESR: Chronic Phase) Lab Results  Component Value Date   CRP 1 12/26/2018   ESRSEDRATE 11 12/26/2018       Note: Above Lab results reviewed.  Imaging  MM 3D SCREEN BREAST BILATERAL CLINICAL DATA:  Screening.  EXAM: DIGITAL SCREENING BILATERAL MAMMOGRAM WITH TOMO AND CAD  COMPARISON:  Previous exam(s).  ACR Breast Density Category b: There are scattered areas of fibroglandular density.  FINDINGS: There are no findings suspicious for malignancy. Images were processed with CAD.  IMPRESSION: No mammographic evidence of malignancy. A result letter of  this screening mammogram will be mailed directly to the patient.  RECOMMENDATION: Screening mammogram in one year. (Code:[id number])  BI-RADS CATEGORY  1: Negative.  Electronically Signed   By: Valentino Saxon MD   On: 06/25/2020 14:32  Assessment  The encounter diagnosis was Chronic migraine without aura without status migrainosus, not intractable.  Plan of Care   Discussed treatment options for migraines.  Patient is already on a beta-blocker, propanolol, she is also on nortriptyline, Topamax as well as a triptan.  We discussed Botox therapy for refractory migraines.  She states that she will discuss with neurology and let us know if she wants to proceed with Botox.  Follow-up plan:   Return if symptoms worsen or fail to improve.     s/p L GN RFA on 7/20-helpful and TPI 7/20; status post left hip injection in greater trochanteric bursa injection with orthopedics [facility]) not helpful.  History of L5-S1 laminectomies, status post caudal ESI as well as cervical TPI on 05/28/2019.  PRN left knee genicular radiofrequency ablation.  Repeat on 07/18/2019. Right Genicular RFA on 08/13/2019. MEDTRONIC SCS trial 10/29/19 successful, status post Medtronic implant with Dr. Lacinda Axon.        Recent Visits No visits were found meeting these conditions. Showing recent visits within past 90 days and meeting all other requirements Today's Visits Date Type Provider Dept  09/10/20 Telemedicine Abigail Santa, MD [facility]  Showing today's visits and meeting all other requirements Future Appointments No visits were found meeting these conditions. Showing future appointments within next 90 days and meeting all other requirements  I discussed the assessment and treatment plan with the patient. The patient was provided an opportunity to ask questions and all were answered. The patient agreed with the plan and demonstrated an understanding of the  instructions.  Patient advised to call back  or seek an in-person evaluation if the symptoms or condition worsens.  Duration of encounter: 9mnutes.  Note by: BGillis Santa MD Date: 09/10/2020; Time: 3:52 PM

## 2020-09-16 ENCOUNTER — Other Ambulatory Visit: Payer: Self-pay

## 2020-09-16 ENCOUNTER — Ambulatory Visit
Admission: RE | Admit: 2020-09-16 | Discharge: 2020-09-16 | Disposition: A | Discharge status: Home / Self Care | Payer: Medicare HMO | Source: Ambulatory Visit | Attending: Orthopedic Surgery | Admitting: Orthopedic Surgery

## 2020-09-16 DIAGNOSIS — M7582 Other shoulder lesions, left shoulder: Secondary | ICD-10-CM | POA: Insufficient documentation

## 2020-09-16 DIAGNOSIS — M7581 Other shoulder lesions, right shoulder: Secondary | ICD-10-CM | POA: Insufficient documentation

## 2020-09-16 DIAGNOSIS — M75101 Unspecified rotator cuff tear or rupture of right shoulder, not specified as traumatic: Secondary | ICD-10-CM | POA: Insufficient documentation

## 2020-09-16 DIAGNOSIS — M19011 Primary osteoarthritis, right shoulder: Secondary | ICD-10-CM | POA: Diagnosis not present

## 2020-09-16 DIAGNOSIS — M12811 Other specific arthropathies, not elsewhere classified, right shoulder: Secondary | ICD-10-CM

## 2020-09-18 DIAGNOSIS — Z1211 Encounter for screening for malignant neoplasm of colon: Secondary | ICD-10-CM | POA: Diagnosis not present

## 2020-09-26 LAB — COLOGUARD: COLOGUARD: NEGATIVE

## 2021-03-12 ENCOUNTER — Other Ambulatory Visit: Payer: Self-pay | Admitting: Orthopedic Surgery

## 2021-03-12 ENCOUNTER — Other Ambulatory Visit (HOSPITAL_COMMUNITY): Payer: Self-pay | Admitting: Orthopedic Surgery

## 2021-03-12 DIAGNOSIS — S8002XA Contusion of left knee, initial encounter: Secondary | ICD-10-CM

## 2021-03-12 DIAGNOSIS — M1712 Unilateral primary osteoarthritis, left knee: Secondary | ICD-10-CM

## 2021-03-25 ENCOUNTER — Other Ambulatory Visit: Payer: Self-pay

## 2021-03-25 ENCOUNTER — Ambulatory Visit
Admission: RE | Admit: 2021-03-25 | Discharge: 2021-03-25 | Disposition: A | Discharge status: Home / Self Care | Payer: Medicare Other | Source: Ambulatory Visit | Attending: Orthopedic Surgery | Admitting: Orthopedic Surgery

## 2021-03-25 DIAGNOSIS — S8002XA Contusion of left knee, initial encounter: Secondary | ICD-10-CM | POA: Diagnosis present

## 2021-03-25 DIAGNOSIS — M1712 Unilateral primary osteoarthritis, left knee: Secondary | ICD-10-CM | POA: Diagnosis present

## 2021-04-01 ENCOUNTER — Other Ambulatory Visit: Payer: Self-pay | Admitting: Orthopedic Surgery

## 2021-04-01 DIAGNOSIS — M7062 Trochanteric bursitis, left hip: Secondary | ICD-10-CM

## 2021-04-14 ENCOUNTER — Other Ambulatory Visit: Payer: Self-pay

## 2021-04-14 ENCOUNTER — Ambulatory Visit
Admission: RE | Admit: 2021-04-14 | Discharge: 2021-04-14 | Disposition: A | Discharge status: Home / Self Care | Payer: Medicare Other | Source: Ambulatory Visit | Attending: Orthopedic Surgery | Admitting: Orthopedic Surgery

## 2021-04-14 DIAGNOSIS — M7062 Trochanteric bursitis, left hip: Secondary | ICD-10-CM | POA: Diagnosis present

## 2021-05-06 ENCOUNTER — Other Ambulatory Visit: Payer: Self-pay | Admitting: Orthopedic Surgery

## 2021-05-08 ENCOUNTER — Other Ambulatory Visit: Payer: Self-pay

## 2021-05-08 ENCOUNTER — Encounter: Payer: Self-pay | Admitting: Urgent Care

## 2021-05-08 ENCOUNTER — Encounter
Admission: RE | Admit: 2021-05-08 | Discharge: 2021-05-08 | Disposition: A | Discharge status: Home / Self Care | Payer: Medicare Other | Source: Ambulatory Visit | Attending: Orthopedic Surgery | Admitting: Orthopedic Surgery

## 2021-05-08 DIAGNOSIS — Z01810 Encounter for preprocedural cardiovascular examination: Secondary | ICD-10-CM | POA: Insufficient documentation

## 2021-05-08 DIAGNOSIS — I10 Essential (primary) hypertension: Secondary | ICD-10-CM | POA: Insufficient documentation

## 2021-05-08 HISTORY — DX: Age-related osteoporosis without current pathological fracture: M81.0

## 2021-05-08 HISTORY — DX: Fibromyalgia: M79.7

## 2021-05-08 HISTORY — DX: Unspecified osteoarthritis, unspecified site: M19.90

## 2021-05-08 HISTORY — DX: Hyperlipidemia, unspecified: E78.5

## 2021-05-08 NOTE — Patient Instructions (Addendum)
Your procedure is scheduled on: Tuesday, August 2 Report to the Registration Desk on the 1st floor of the [facility]. To find out your arrival time, please call [phone] between 1PM - 3PM on: Monday, August 1  REMEMBER: Instructions that are not followed completely may result in serious medical risk, up to and including death; or upon the discretion of your surgeon and anesthesiologist your surgery may need to be rescheduled.  Do not eat food after midnight the night before surgery.  No gum chewing, lozengers or hard candies.  You may however, drink CLEAR liquids up to 2 hours before you are scheduled to arrive for your surgery. Do not drink anything within 2 hours of your scheduled arrival time.  Clear liquids include: - water  - apple juice without pulp - gatorade (not RED, PURPLE, OR BLUE) - black coffee or tea (Do NOT add milk or creamers to the coffee or tea) Do NOT drink anything that is not on this list.  In addition, your doctor has ordered for you to drink the provided  Ensure Pre-Surgery Clear Carbohydrate Drink  Drinking this carbohydrate drink up to two hours before surgery helps to reduce insulin resistance and improve patient outcomes. Please complete drinking 2 hours prior to scheduled arrival time.  TAKE THESE MEDICATIONS THE MORNING OF SURGERY WITH A SIP OF WATER:  Amlodipine Atorvastatin Duloxetine Propranolol Topiramate (topamax)  One week prior to surgery: starting July 26 Stop Anti-inflammatories (NSAIDS) such as Advil, Aleve, Ibuprofen, Motrin, Naproxen, Naprosyn and Aspirin based products such as Excedrin, Goodys Powder, BC Powder. Stop ANY OVER THE COUNTER supplements until after surgery. (Vitamin D) You may however, continue to take Tylenol if needed for pain up until the day of surgery.  No Alcohol for 24 hours before or after surgery.  No Smoking including e-cigarettes for 24 hours prior to surgery.  No chewable tobacco products for at least 6  hours prior to surgery.  No nicotine patches on the day of surgery.  Do not use any "recreational" drugs for at least a week prior to your surgery.  Please be advised that the combination of cocaine and anesthesia may have negative outcomes, up to and including death. If you test positive for cocaine, your surgery will be cancelled.  On the morning of surgery brush your teeth with toothpaste and water, you may rinse your mouth with mouthwash if you wish. Do not swallow any toothpaste or mouthwash.  Do not wear jewelry, make-up, hairpins, clips or nail polish.  Do not wear lotions, powders, or perfumes.   Do not shave body from the neck down 48 hours prior to surgery just in case you cut yourself which could leave a site for infection.  Also, freshly shaved skin may become irritated if using the CHG soap.  Contact lenses, hearing aids and dentures may not be worn into surgery.  Do not bring valuables to the hospital. [facility] is not responsible for any missing/lost belongings or valuables.   Use CHG Soap as directed on instruction sheet.  Notify your doctor if there is any change in your medical condition (cold, fever, infection).  Wear comfortable clothing (specific to your surgery type) to the hospital.  After surgery, you can help prevent lung complications by doing breathing exercises.  Take deep breaths and cough every 1-2 hours. Your doctor may order a device called an Incentive Spirometer to help you take deep breaths.  If you are being discharged the day of surgery, you will not  be allowed to drive home. You will need a responsible adult (18 years or older) to drive you home and stay with you that night.   If you are taking public transportation, you will need to have a responsible adult (18 years or older) with you. Please confirm with your physician that it is acceptable to use public transportation.   Please call the [facility]. at [phone] if  you have any questions about these instructions.  Surgery Visitation Policy:  Patients undergoing a surgery or procedure may have one family member or support person with them as long as that person is not COVID-19 positive or experiencing its symptoms.  That person may remain in the waiting area during the procedure.

## 2021-05-11 MED ORDER — ORAL CARE MOUTH RINSE: 15.0000 mL | Freq: Once | OROMUCOSAL | Status: AC

## 2021-05-11 MED ORDER — CEFAZOLIN SODIUM-DEXTROSE 2-4 GM/100ML-% IV SOLN
2.0000 g | INTRAVENOUS | Status: AC
  Administered 2021-05-12: 2 g via INTRAVENOUS

## 2021-05-11 MED ORDER — APREPITANT 40 MG PO CAPS: 40.0000 mg | ORAL_CAPSULE | Freq: Once | ORAL | Status: AC

## 2021-05-11 MED ORDER — LACTATED RINGERS IV SOLN: INTRAVENOUS | Status: DC

## 2021-05-11 MED ORDER — FAMOTIDINE 20 MG PO TABS: 20.0000 mg | ORAL_TABLET | Freq: Once | ORAL | Status: AC

## 2021-05-11 MED ORDER — METHYLPREDNISOLONE SODIUM SUCC 125 MG IJ SOLR: 50.0000 mg | Freq: Once | INTRAMUSCULAR | Status: AC

## 2021-05-11 MED ORDER — CHLORHEXIDINE GLUCONATE 0.12 % MT SOLN: 15.0000 mL | Freq: Once | OROMUCOSAL | Status: AC

## 2021-05-12 ENCOUNTER — Ambulatory Visit: Payer: Medicare Other | Admitting: Certified Registered Nurse Anesthetist

## 2021-05-12 ENCOUNTER — Ambulatory Visit
Admission: RE | Admit: 2021-05-12 | Discharge: 2021-05-12 | Disposition: A | Discharge status: Home / Self Care | Payer: Medicare Other | Attending: Orthopedic Surgery | Admitting: Orthopedic Surgery

## 2021-05-12 ENCOUNTER — Encounter
Admission: RE | Disposition: A | Discharge status: Home / Self Care | Payer: Self-pay | Source: Home / Self Care | Attending: Orthopedic Surgery

## 2021-05-12 ENCOUNTER — Ambulatory Visit: Payer: Medicare Other

## 2021-05-12 ENCOUNTER — Other Ambulatory Visit: Payer: Self-pay

## 2021-05-12 ENCOUNTER — Encounter: Payer: Self-pay | Admitting: Orthopedic Surgery

## 2021-05-12 DIAGNOSIS — Z842 Family history of other diseases of the genitourinary system: Secondary | ICD-10-CM | POA: Insufficient documentation

## 2021-05-12 DIAGNOSIS — Z8249 Family history of ischemic heart disease and other diseases of the circulatory system: Secondary | ICD-10-CM | POA: Diagnosis not present

## 2021-05-12 DIAGNOSIS — Z7982 Long term (current) use of aspirin: Secondary | ICD-10-CM | POA: Insufficient documentation

## 2021-05-12 DIAGNOSIS — M797 Fibromyalgia: Secondary | ICD-10-CM | POA: Diagnosis not present

## 2021-05-12 DIAGNOSIS — M7632 Iliotibial band syndrome, left leg: Secondary | ICD-10-CM | POA: Insufficient documentation

## 2021-05-12 DIAGNOSIS — Z8262 Family history of osteoporosis: Secondary | ICD-10-CM | POA: Diagnosis not present

## 2021-05-12 DIAGNOSIS — Z823 Family history of stroke: Secondary | ICD-10-CM | POA: Diagnosis not present

## 2021-05-12 DIAGNOSIS — Z79899 Other long term (current) drug therapy: Secondary | ICD-10-CM | POA: Insufficient documentation

## 2021-05-12 DIAGNOSIS — Z8261 Family history of arthritis: Secondary | ICD-10-CM | POA: Diagnosis not present

## 2021-05-12 DIAGNOSIS — F17210 Nicotine dependence, cigarettes, uncomplicated: Secondary | ICD-10-CM | POA: Insufficient documentation

## 2021-05-12 DIAGNOSIS — Z419 Encounter for procedure for purposes other than remedying health state, unspecified: Secondary | ICD-10-CM

## 2021-05-12 DIAGNOSIS — M7062 Trochanteric bursitis, left hip: Secondary | ICD-10-CM | POA: Diagnosis not present

## 2021-05-12 HISTORY — PX: EXCISION/RELEASE BURSA HIP: SHX5014

## 2021-05-12 LAB — URINE DRUG SCREEN, QUALITATIVE (ARMC ONLY)
Amphetamines, Ur Screen: NOT DETECTED
Barbiturates, Ur Screen: NOT DETECTED
Benzodiazepine, Ur Scrn: NOT DETECTED
Cannabinoid 50 Ng, Ur ~~LOC~~: POSITIVE — AB
Cocaine Metabolite,Ur ~~LOC~~: NOT DETECTED
MDMA (Ecstasy)Ur Screen: NOT DETECTED
Methadone Scn, Ur: NOT DETECTED
Opiate, Ur Screen: NOT DETECTED
Phencyclidine (PCP) Ur S: NOT DETECTED
Tricyclic, Ur Screen: POSITIVE — AB

## 2021-05-12 SURGERY — RELEASE, BURSA, TROCHANTERIC
Anesthesia: General | Site: Hip | Laterality: Left

## 2021-05-12 MED ORDER — ACETAMINOPHEN 10 MG/ML IV SOLN
INTRAVENOUS | Status: DC | PRN
  Administered 2021-05-12: 1000 mg via INTRAVENOUS

## 2021-05-12 MED ORDER — LACTATED RINGERS IV BOLUS
500.0000 mL | Freq: Once | INTRAVENOUS | Status: AC
  Administered 2021-05-12: 500 mL via INTRAVENOUS

## 2021-05-12 MED ORDER — DEXAMETHASONE SODIUM PHOSPHATE 10 MG/ML IJ SOLN
INTRAMUSCULAR | Status: DC | PRN
  Administered 2021-05-12: 10 mg via INTRAVENOUS

## 2021-05-12 MED ORDER — MIDAZOLAM HCL 2 MG/2ML IJ SOLN
INTRAMUSCULAR | Status: AC
  Filled 2021-05-12: qty 2

## 2021-05-12 MED ORDER — MIDAZOLAM HCL 2 MG/2ML IJ SOLN
INTRAMUSCULAR | Status: DC | PRN
Start: 2021-05-12 — End: 2021-05-12
  Administered 2021-05-12: 2 mg via INTRAVENOUS

## 2021-05-12 MED ORDER — OXYCODONE HCL 5 MG PO TABS: 5.0000 mg | ORAL_TABLET | Freq: Once | ORAL | Status: DC | PRN

## 2021-05-12 MED ORDER — METHYLPREDNISOLONE SODIUM SUCC 125 MG IJ SOLR
INTRAMUSCULAR | Status: AC
  Administered 2021-05-12: 50 mg via INTRAVENOUS
  Filled 2021-05-12: qty 2

## 2021-05-12 MED ORDER — ONDANSETRON 4 MG PO TBDP: 4.0000 mg | ORAL_TABLET | Freq: Three times a day (TID) | ORAL | 0 refills | Status: AC | PRN

## 2021-05-12 MED ORDER — PHENYLEPHRINE HCL (PRESSORS) 10 MG/ML IV SOLN
INTRAVENOUS | Status: DC | PRN
  Administered 2021-05-12 (×2): 100 ug via INTRAVENOUS
  Administered 2021-05-12: 50 ug via INTRAVENOUS
  Administered 2021-05-12 (×5): 100 ug via INTRAVENOUS

## 2021-05-12 MED ORDER — ROCURONIUM BROMIDE 100 MG/10ML IV SOLN
INTRAVENOUS | Status: DC | PRN
  Administered 2021-05-12: 10 mg via INTRAVENOUS
  Administered 2021-05-12: 40 mg via INTRAVENOUS

## 2021-05-12 MED ORDER — ACETAMINOPHEN 500 MG PO TABS: 1000.0000 mg | ORAL_TABLET | Freq: Three times a day (TID) | ORAL | 2 refills | Status: AC

## 2021-05-12 MED ORDER — SUGAMMADEX SODIUM 200 MG/2ML IV SOLN
INTRAVENOUS | Status: DC | PRN
  Administered 2021-05-12: 150 mg via INTRAVENOUS

## 2021-05-12 MED ORDER — PROPOFOL 10 MG/ML IV BOLUS
INTRAVENOUS | Status: DC | PRN
  Administered 2021-05-12: 120 mg via INTRAVENOUS

## 2021-05-12 MED ORDER — PROPOFOL 10 MG/ML IV BOLUS
INTRAVENOUS | Status: AC
  Filled 2021-05-12: qty 20

## 2021-05-12 MED ORDER — ACETAMINOPHEN 10 MG/ML IV SOLN: 1000.0000 mg | Freq: Once | INTRAVENOUS | Status: DC | PRN

## 2021-05-12 MED ORDER — SCOPOLAMINE 1 MG/3DAYS TD PT72: 1.0000 | MEDICATED_PATCH | TRANSDERMAL | Status: DC

## 2021-05-12 MED ORDER — FENTANYL CITRATE (PF) 100 MCG/2ML IJ SOLN
INTRAMUSCULAR | Status: DC | PRN
  Administered 2021-05-12 (×2): 50 ug via INTRAVENOUS

## 2021-05-12 MED ORDER — BUPIVACAINE LIPOSOME 1.3 % IJ SUSP
INTRAMUSCULAR | Status: DC | PRN
  Administered 2021-05-12: 20 mL

## 2021-05-12 MED ORDER — FENTANYL CITRATE (PF) 100 MCG/2ML IJ SOLN
25.0000 ug | INTRAMUSCULAR | Status: DC | PRN
  Administered 2021-05-12 (×2): 50 ug via INTRAVENOUS

## 2021-05-12 MED ORDER — LIDOCAINE HCL (CARDIAC) PF 100 MG/5ML IV SOSY
PREFILLED_SYRINGE | INTRAVENOUS | Status: DC | PRN
  Administered 2021-05-12: 100 mg via INTRAVENOUS

## 2021-05-12 MED ORDER — CEFAZOLIN SODIUM-DEXTROSE 2-4 GM/100ML-% IV SOLN
INTRAVENOUS | Status: AC
  Filled 2021-05-12: qty 100

## 2021-05-12 MED ORDER — APREPITANT 40 MG PO CAPS
ORAL_CAPSULE | ORAL | Status: AC
  Administered 2021-05-12: 40 mg via ORAL
  Filled 2021-05-12: qty 1

## 2021-05-12 MED ORDER — BUPIVACAINE LIPOSOME 1.3 % IJ SUSP
INTRAMUSCULAR | Status: AC
  Filled 2021-05-12: qty 20

## 2021-05-12 MED ORDER — ONDANSETRON HCL 4 MG/2ML IJ SOLN
INTRAMUSCULAR | Status: DC | PRN
  Administered 2021-05-12: 4 mg via INTRAVENOUS

## 2021-05-12 MED ORDER — FENTANYL CITRATE (PF) 100 MCG/2ML IJ SOLN
INTRAMUSCULAR | Status: AC
  Filled 2021-05-12: qty 2

## 2021-05-12 MED ORDER — PROMETHAZINE HCL 25 MG/ML IJ SOLN
6.2500 mg | INTRAMUSCULAR | Status: DC | PRN
Start: 2021-05-12 — End: 2021-05-12

## 2021-05-12 MED ORDER — CHLORHEXIDINE GLUCONATE 0.12 % MT SOLN
OROMUCOSAL | Status: AC
  Administered 2021-05-12: 15 mL via OROMUCOSAL
  Filled 2021-05-12: qty 15

## 2021-05-12 MED ORDER — ASPIRIN EC 325 MG PO TBEC: 325.0000 mg | DELAYED_RELEASE_TABLET | Freq: Every day | ORAL | 0 refills | Status: AC

## 2021-05-12 MED ORDER — SODIUM CHLORIDE 0.9 % IV SOLN
INTRAVENOUS | Status: DC | PRN
  Administered 2021-05-12: 20 ug/min via INTRAVENOUS

## 2021-05-12 MED ORDER — FAMOTIDINE 20 MG PO TABS
ORAL_TABLET | ORAL | Status: AC
  Administered 2021-05-12: 20 mg via ORAL
  Filled 2021-05-12: qty 1

## 2021-05-12 MED ORDER — HYDROCODONE-ACETAMINOPHEN 5-325 MG PO TABS: 1.0000 | ORAL_TABLET | ORAL | 0 refills | Status: AC | PRN

## 2021-05-12 MED ORDER — EPINEPHRINE PF 1 MG/ML IJ SOLN
INTRAMUSCULAR | Status: AC
  Filled 2021-05-12: qty 4

## 2021-05-12 MED ORDER — LACTATED RINGERS IR SOLN
Status: DC | PRN
  Administered 2021-05-12 (×2): 3000 mL

## 2021-05-12 MED ORDER — DROPERIDOL 2.5 MG/ML IJ SOLN
0.6250 mg | Freq: Once | INTRAMUSCULAR | Status: DC | PRN
  Filled 2021-05-12: qty 2

## 2021-05-12 MED ORDER — OXYCODONE HCL 5 MG/5ML PO SOLN
5.0000 mg | Freq: Once | ORAL | Status: DC | PRN
Start: 2021-05-12 — End: 2021-05-12

## 2021-05-12 MED ORDER — LACTATED RINGERS IV SOLN
INTRAVENOUS | Status: DC | PRN
  Administered 2021-05-12 (×4): 3000 mL

## 2021-05-12 MED ORDER — SCOPOLAMINE 1 MG/3DAYS TD PT72
MEDICATED_PATCH | TRANSDERMAL | Status: AC
  Administered 2021-05-12: 1.5 mg via TRANSDERMAL
  Filled 2021-05-12: qty 1

## 2021-05-12 MED ORDER — HYDROCODONE-ACETAMINOPHEN 5-325 MG PO TABS
ORAL_TABLET | ORAL | Status: AC
  Filled 2021-05-12: qty 1

## 2021-05-12 MED ORDER — HYDROCODONE-ACETAMINOPHEN 5-325 MG PO TABS
1.0000 | ORAL_TABLET | Freq: Four times a day (QID) | ORAL | Status: DC | PRN
  Administered 2021-05-12: 1 via ORAL

## 2021-05-12 MED ORDER — ACETAMINOPHEN 10 MG/ML IV SOLN
INTRAVENOUS | Status: AC
  Filled 2021-05-12: qty 100

## 2021-05-12 MED ORDER — BUPIVACAINE HCL (PF) 0.5 % IJ SOLN
INTRAMUSCULAR | Status: DC | PRN
  Administered 2021-05-12: 10 mL

## 2021-05-12 MED ORDER — BUPIVACAINE HCL (PF) 0.5 % IJ SOLN
INTRAMUSCULAR | Status: AC
  Filled 2021-05-12: qty 30

## 2021-05-12 SURGICAL SUPPLY — 62 items
ADAPTER IRRIG TUBE 2 SPIKE SOL (ADAPTER) ×4 IMPLANT
ADPR TBG 2 SPK PMP STRL ASCP (ADAPTER) ×2
APL PRP STRL LF DISP 70% ISPRP (MISCELLANEOUS) ×1
BIT DRILL SS CINCHLOCK (BIT) IMPLANT
BLADE SAMURAI STR FULL RADIUS (BLADE) IMPLANT
BLADE SHAVER 4.5X180 LONG (BLADE) IMPLANT
BLADE SURG SZ11 CARB STEEL (BLADE) IMPLANT
BNDG ADH 1X3 SHEER STRL LF (GAUZE/BANDAGES/DRESSINGS) ×10 IMPLANT
BNDG ADH THN 3X1 STRL LF (GAUZE/BANDAGES/DRESSINGS) ×5
BUR 4.0 ROUND XL DIAMOND (BUR) IMPLANT
BUR 5.5 ROUND LONG FS 8 FLUTE (BUR) IMPLANT
BUR ABRADER 4X18 EXT PURPLE (BURR) IMPLANT
BURR ABRADER 4X18 EXT PURPLE (BURR)
CANNULA 8 789 TRANSPORT (CANNULA) ×2 IMPLANT
CANNULA OBTURATOR FLOWPORT (CANNULA) ×2 IMPLANT
CANNULA PARTIAL THREAD 2X7 (CANNULA) ×2 IMPLANT
CHLORAPREP W/TINT 26 (MISCELLANEOUS) ×2 IMPLANT
CUTTER AGGRESSIVE PLUS 4D 180L (CUTTER) IMPLANT
DEVICE SUCT BLK HOLE OR FLOOR (MISCELLANEOUS) ×2 IMPLANT
DRAPE 3/4 80X56 (DRAPES) IMPLANT
DRAPE C-ARM 42X72 X-RAY (DRAPES) ×2 IMPLANT
DRAPE IMP U-DRAPE 54X76 (DRAPES) ×2 IMPLANT
DRAPE INCISE IOBAN 85X60 (DRAPES) ×2 IMPLANT
DRAPE SURG 17X11 SM STRL (DRAPES) ×2 IMPLANT
GAUZE SPONGE 4X4 12PLY STRL (GAUZE/BANDAGES/DRESSINGS) IMPLANT
GLOVE SRG 8 PF TXTR STRL LF DI (GLOVE) ×1 IMPLANT
GLOVE SURG ENC MOIS LTX SZ7.5 (GLOVE) IMPLANT
GLOVE SURG ORTHO LTX SZ8 (GLOVE) ×4 IMPLANT
GLOVE SURG UNDER LTX SZ8 (GLOVE) IMPLANT
GLOVE SURG UNDER POLY LF SZ8 (GLOVE) ×2
GOWN STRL REUS W/ TWL LRG LVL3 (GOWN DISPOSABLE) ×1 IMPLANT
GOWN STRL REUS W/ TWL XL LVL3 (GOWN DISPOSABLE) ×1 IMPLANT
GOWN STRL REUS W/TWL LRG LVL3 (GOWN DISPOSABLE) ×2
GOWN STRL REUS W/TWL XL LVL3 (GOWN DISPOSABLE) ×2
IV LACTATED RINGER IRRG 3000ML (IV SOLUTION) ×20
IV LR IRRIG 3000ML ARTHROMATIC (IV SOLUTION) ×10 IMPLANT
KIT PATIENT POSITION MEDIUM (KITS) IMPLANT
KIT PORTAL ENTRY HIP ACCESS (KITS) IMPLANT
MANIFOLD NEPTUNE II (INSTRUMENTS) ×4 IMPLANT
MAT ABSORB  FLUID 56X50 GRAY (MISCELLANEOUS) ×2
MAT ABSORB FLUID 56X50 GRAY (MISCELLANEOUS) ×1 IMPLANT
NDL SAFETY ECLIPSE 18X1.5 (NEEDLE) ×1 IMPLANT
NEEDLE HYPO 18GX1.5 SHARP (NEEDLE) ×2
NEEDLE INJECTOR II CARTRIDGE (MISCELLANEOUS) IMPLANT
PACK ARTHROSCOPY KNEE (MISCELLANEOUS) ×2 IMPLANT
PAD ABD DERMACEA PRESS 5X9 (GAUZE/BANDAGES/DRESSINGS) ×2 IMPLANT
PAD ARMBOARD 7.5X6 YLW CONV (MISCELLANEOUS) ×2 IMPLANT
PASSER SUT 1.5D CRESCENT (INSTRUMENTS) IMPLANT
PASSER SUT 70D UP ANGLED (INSTRUMENTS) IMPLANT
SERFAS 50-S SWEEP XL (INSTRUMENTS)
SHAVER BLADE INCISOR PLUS 4.5 (BLADE) ×2 IMPLANT
SPONGE T-LAP 18X18 ~~LOC~~+RFID (SPONGE) ×2 IMPLANT
SUT ETHILON 3-0 FS-10 30 BLK (SUTURE) ×4
SUT VIC AB 2-0 CT2 27 (SUTURE) ×4 IMPLANT
SUT ZIPLINE SZ2 BLK (SUTURE) IMPLANT
SUT ZIPLINE SZ2 GREEN (SUTURE) IMPLANT
SUTURE EHLN 3-0 FS-10 30 BLK (SUTURE) ×2 IMPLANT
TRAY FOLEY SLVR 16FR LF STAT (SET/KITS/TRAYS/PACK) IMPLANT
TUBING INFLOW SET DBFLO PUMP (TUBING) ×2 IMPLANT
TUBING OUTFLOW SET DBLFO PUMP (TUBING) ×2 IMPLANT
WAND COBLATION FLOW 50 (SURGICAL WAND) ×2 IMPLANT
WAND SERFAS 50-S SWEEP XL (INSTRUMENTS) IMPLANT

## 2021-05-12 NOTE — Anesthesia Procedure Notes (Signed)
Procedure Name: Intubation Date/Time: 05/12/2021 12:14 PM Performed by: Lily Peer, Chelse Matas, CRNA Pre-anesthesia Checklist: Patient identified, Emergency Drugs available, Suction available and Patient being monitored Patient Re-evaluated:Patient Re-evaluated prior to induction Oxygen Delivery Method: Circle system utilized Preoxygenation: Pre-oxygenation with 100% oxygen Induction Type: IV induction Ventilation: Mask ventilation without difficulty Laryngoscope Size: McGraph and 3 Grade View: Grade I Tube type: Oral Number of attempts: 1 Airway Equipment and Method: Stylet Placement Confirmation: ETT inserted through vocal cords under direct vision, positive ETCO2 and breath sounds checked- equal and bilateral Secured at: 21 cm Tube secured with: Tape Dental Injury: Teeth and Oropharynx as per pre-operative assessment

## 2021-05-12 NOTE — Transfer of Care (Signed)
Immediate Anesthesia Transfer of Care Note  Patient: Abigail [name]  Procedure(s) Performed: Left hip endoscopic trochanteric bursectomy and IT band release (Left: Hip)  Patient Location: PACU  Anesthesia Type:General  Level of Consciousness: drowsy  Airway & Oxygen Therapy: Patient Spontanous Breathing and Patient connected to face mask oxygen  Post-op Assessment: Report given to RN and Post -op Vital signs reviewed and stable  Post vital signs: Reviewed and stable  Last Vitals:  Vitals Value Taken Time  BP 101/61 05/12/21 1413  Temp 36.9 C 05/12/21 1413  Pulse 76 05/12/21 1414  Resp 18 05/12/21 1414  SpO2 100 % 05/12/21 1414  Vitals shown include unvalidated device data.  Last Pain:  Vitals:   05/12/21 1014  PainSc: 8          Complications: No notable events documented.

## 2021-05-12 NOTE — Discharge Instructions (Addendum)
Open Hip Surgery   Post-Op Instructions   1. Bracing or crutches: Crutches or walker will be provided at the time of discharge from the surgery center. Must use brace for the first 2 weeks at all times except hygiene.    2. Ice: You may be provided with a device [facility]) that allows you to ice the affected area effectively. Otherwise you can ice manually.    3. Driving:  Plan on not driving for at least one week. Please note that you are advised NOT to drive while taking narcotic pain medications as you may be impaired and unsafe to drive.   4. Activity: Ankle pumps several times an hour while awake to prevent blood clots. Weight bearing: as tolerated. Use crutches or walker for at least 2 weeks. Avoid standing more than 5 minutes (consecutively) for the first week. No exercise involving the hip until cleared by the surgeon or physical therapist.    5. Medications:  - You have been provided a prescription for narcotic pain medicine. After surgery, take 1-2 narcotic tablets every 4 hours if needed for severe pain.  - A prescription for anti-nausea medication will be provided in case the narcotic medicine causes nausea - take 1 tablet every 6 hours only if nauseated.  - Can resume home Celebrex OR take ibuprofen '800mg'$  three times daily with food. Do NOT take both.  - Take enteric coated aspirin 325 mg once daily for 2 weeks to prevent blood clots.  - Take '1000mg'$  acetaminophen (Tylenol) 3 times daily until having no pain. Maximum safe dose of acetaminophen is '3000mg'$  daily. Please ensure that you do not go over this amount, especially if your narcotic medication has acetaminophen also.     If you are taking prescription medication for anxiety, depression, insomnia, muscle spasm, chronic pain, or for attention deficit disorder you are advised that you are at a higher risk of adverse effects with use of narcotics post-op, including narcotic addiction/dependence, depressed breathing, death. If you  use non-prescribed substances: alcohol, marijuana, cocaine, heroin, methamphetamines, etc., you are at a higher risk of adverse effects with use of narcotics post-op, including narcotic addiction/dependence, depressed breathing, death. You are advised that taking > 50 morphine milligram equivalents (MME) of narcotic pain medication per day results in twice the risk of overdose or death. For your prescription provided: oxycodone 5 mg - taking more than 6 tablets per day. Be advised that we will prescribe narcotics short-term, for acute post-operative pain only.  6. Bandages: The physical therapist should change the bandages at the first post-op appointment. If needed, the dressing supplies have been provided to you.   7. Physical Therapy: 1-2 times per week for the first 6 weeks. Therapy typically starts on post operative Day 3 or 4. You have been provided an order for physical therapy. The therapist will provide home exercises.   8. 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 May do light duty/desk job in approximately 1-2 weeks when off of narcotics, pain is well-controlled.   9. Post-Op Appointments: Your first post-op appointment will be with Dr. Posey Pronto in approximately 2 weeks time.    If you find that they have not been scheduled please call the Orthopaedic Appointment front desk at [phone].  AMBULATORY SURGERY  DISCHARGE INSTRUCTIONS   The drugs that you were given will stay in your system until tomorrow so for the next 24 hours you should not:  Drive an automobile  Make any legal decisions Drink any alcoholic beverage   You may resume regular meals tomorrow.  Today it is better to start with liquids and gradually work up to solid foods.  You may eat anything you prefer, but it is better to start with liquids, then soup and crackers, and gradually work up to solid foods.   Please notify your doctor immediately if you have any unusual  bleeding, trouble breathing, redness and pain at the surgery site, drainage, fever, or pain not relieved by medication.    Additional Instructions:        Please contact your physician with any problems or Same Day Surgery at [phone], Monday through Friday 6 am to 4 pm, or [place] at [facility] number at [phone].

## 2021-05-12 NOTE — Progress Notes (Signed)
Informed Dr. Bertell Maria BP 92/66 with map 76.  Dr. Bertell Maria stated "OK" to d/c home.  Pt NAD.

## 2021-05-12 NOTE — H&P (Signed)
Paper H&P to be scanned into permanent record. H&P reviewed. No significant changes noted.  

## 2021-05-12 NOTE — Anesthesia Preprocedure Evaluation (Addendum)
Anesthesia Evaluation  Patient identified by MRN, date of birth, ID band Patient awake    Reviewed: Allergy & Precautions, NPO status , Patient's Chart, lab work & pertinent test results  History of Anesthesia Complications (+) PONVNegative for: history of anesthetic complications  Airway Mallampati: II  TM Distance: >3 FB Neck ROM: Full    Dental no notable dental hx. (+) Teeth Intact, Dental Advisory Given   Pulmonary shortness of breath and with exertion, neg sleep apnea, neg COPD, Patient abstained from smoking., former smoker (Quit 2 weeks ago),    Pulmonary exam normal breath sounds clear to auscultation       Cardiovascular Exercise Tolerance: Poor METS: < 3 Mets hypertension, Pt. on medications (-) CAD and (-) Past MI (-) dysrhythmias  Rhythm:Regular Rate:Normal + Systolic murmurs    Neuro/Psych  Headaches, PSYCHIATRIC DISORDERS Anxiety Depression TIA Neuromuscular disease (Myofascial pain syndrome, cervical with spinal cord stimulator)    GI/Hepatic neg GERD  ,(+)     (-) substance abuse  ,   Endo/Other  neg diabetesAdrenal cortex insufficiency (HCC)  Renal/GU negative Renal ROS     Musculoskeletal  (+) Arthritis , Osteoarthritis,  Fibromyalgia -Trochanteric bursitis of left hip   Abdominal Normal abdominal exam  (+)   Peds  Hematology   Anesthesia Other Findings   Past Medical History: No date: Adrenal cortex insufficiency (HCC) No date: Arthritis     Comment:  arthritis-back, weakness in right leg No date: Complication of anesthesia     Comment:  migraine headache when come out of anesthesia No date: Family history of adverse reaction to anesthesia     Comment:  mom n/v No date: Headache(784.0)     Comment:  occ. migraines, not frequent No date: Hypertension No date: PONV (postoperative nausea and vomiting)     Comment:  states patch works No date: Syncope  Reproductive/Obstetrics                            Anesthesia Physical  Anesthesia Plan  ASA: 3  Anesthesia Plan: General   Post-op Pain Management:    Induction: Intravenous  PONV Risk Score and Plan: 4 or greater and Ondansetron, Dexamethasone, Propofol infusion, TIVA, Midazolam, Scopolamine patch - Pre-op and Aprepitant  Airway Management Planned: Oral ETT  Additional Equipment: None  Intra-op Plan:   Post-operative Plan: Extubation in OR  Informed Consent: I have reviewed the patients History and Physical, chart, labs and discussed the procedure including the risks, benefits and alternatives for the proposed anesthesia with the patient or authorized representative who has indicated his/her understanding and acceptance.     Dental advisory given  Plan Discussed with: CRNA, Surgeon and Anesthesiologist  Anesthesia Plan Comments:        Anesthesia Quick Evaluation

## 2021-05-12 NOTE — Progress Notes (Signed)
Pt sitting in stretcher, NAD, VSS.  Rates pain 5/10.  Admin PO pain meds.  See mar.

## 2021-05-12 NOTE — Op Note (Addendum)
DATE OF SURGERY:  05/12/2021   PREOPERATIVE DIAGNOSIS:  1. Left hip trochanteric bursitis 2. Left hip iliotibial band tightness   POSTOPERATIVE DIAGNOSIS:  1. Left hip trochanteric bursitis 2. Left hip iliotibial band tightness   PROCEDURE:  1. Left hip endoscopic trochanteric bursectomy 2. Left hip endoscopic iliotibial band release  SURGEON: Cato Mulligan, MD   EBL: 5cc  ANESTHESIA: Gen  IMPLANTS: none  INDICATION(S): The patient is a 55 y.o. female who presents with persistent lateral sided hip pain with over 1 year of duration. The MRI revealed significant trochanteric bursitis without significant hip abductor tearing.  The patient has failed all non-operative care to date including multiple corticosteroid injections, physical therapy/exercises, medications, and activity modification.  Please see the preoperative notes for further detail.   The patient elected to undergo the above mentioned procedure after detailed explanation of the expected outcomes and recovery path and after discussion of risks, benefits, and alternatives to surgery  OPERATIVE FINDINGS:  Significant trochanteric bursitis. Intact gluteus medius tendon insertion.  OPERATIVE REPORT:   The Abigail [name] was brought to the operating room and underwent anesthesia. The patient was placed in a supine fashion on the Hana table.  The operative extremity was flexed approximately 10 degrees and abducted approximately 30 degrees.  The foot was internally rotated. All bony prominences were padded.  Appropriate IV antibiotics were administered.  The patient was prepped and draped in a sterile fashion.  Time-out was performed and landmarks were identified with fluoroscopic assistance.  Needle localization with fluoroscopy was used to make an anterior portal.  This was made approximately 5 cm anterior to the standard anterolateral portal at the level of the tip of the greater trochanter.  The blunt trocar was inserted deep to  the IT band and along the greater trochanter.  The peritrochanteric space was opened with a blunt trocar.  Appropriate positioning was confirmed with fluoroscopy.  A 70 degree knee arthroscope was used for this procedure, and it was inserted.  Next a distal anterolateral portal was established approximately 7 cm distal to the anterior portal just anterior to the IT band and greater trochanter. This was also done under needle localization to ensure appropriate trajectory.  A switching stick was inserted and appropriate positioning was confirmed with fluoroscopy.  A cannula was placed over the switching stick.  A shaver was introduced.  Using combination of oscillating shaver and electrocautery wand, the significant amount of trochanteric bursa was excised.  After excision and debridement of the bursa, the gluteal sling, vastus lateralis, and gluteus medius/minimus insertion were well visualized.  There was no significant gluteus medius tear upon probing the insertion.  The leg was then placed in an adducted position.  The most prominent portion of the greater trochanter was adjacent to the IT band.  The IT band was then released via a cruciate type incision using an ArthroCare wand to reduce friction and irritation in this region of prominence.  Arthroscopic fluid was then evacuated from the joint.    Local anesthetic was injected.  Portal sites were closed with 2-0 Vicryl and 3-0 nylon sutures.  Sterile dressing was applied. The patient was awakened from anesthesia without difficulty and transferred to PACU in stable condition.    POSTOPERATIVE PLAN:  Weightbearing as tolerated postoperatively.  The patient should use walker/crutches for 1-2 weeks.  ASA 325 mg daily x2 weeks for DVT prophylaxis.  Follow-up with me in approximately 2 weeks for postoperative visit.

## 2021-05-13 ENCOUNTER — Encounter: Payer: Self-pay | Admitting: Orthopedic Surgery

## 2021-05-13 NOTE — Anesthesia Postprocedure Evaluation (Signed)
Anesthesia Post Note  Patient: Abigail [name]  Procedure(s) Performed: Left hip endoscopic trochanteric bursectomy and IT band release (Left: Hip)  Patient location during evaluation: PACU Anesthesia Type: General Level of consciousness: awake and alert Pain management: pain level controlled Vital Signs Assessment: post-procedure vital signs reviewed and stable Respiratory status: spontaneous breathing, nonlabored ventilation and respiratory function stable Cardiovascular status: blood pressure returned to baseline and stable Postop Assessment: no apparent nausea or vomiting Anesthetic complications: no   No notable events documented.   Last Vitals:  Vitals:   05/12/21 1551 05/12/21 1603  BP:  123/81  Pulse: 76 85  Resp: 18 18  Temp: (!) 36.2 C 36.5 C  SpO2: 95% 98%    Last Pain:  Vitals:   05/13/21 0917  TempSrc:   PainSc: [place]

## 2021-05-18 ENCOUNTER — Other Ambulatory Visit: Payer: Self-pay

## 2021-05-18 ENCOUNTER — Encounter: Payer: Self-pay | Admitting: Intensive Care

## 2021-05-18 ENCOUNTER — Emergency Department
Admission: EM | Admit: 2021-05-18 | Discharge: 2021-05-18 | Disposition: A | Discharge status: Home / Self Care | Payer: Medicare Other | Attending: Student in an Organized Health Care Education/Training Program | Admitting: Student in an Organized Health Care Education/Training Program

## 2021-05-18 DIAGNOSIS — Z7982 Long term (current) use of aspirin: Secondary | ICD-10-CM | POA: Diagnosis not present

## 2021-05-18 DIAGNOSIS — J45909 Unspecified asthma, uncomplicated: Secondary | ICD-10-CM | POA: Diagnosis not present

## 2021-05-18 DIAGNOSIS — R040 Epistaxis: Secondary | ICD-10-CM | POA: Insufficient documentation

## 2021-05-18 DIAGNOSIS — I129 Hypertensive chronic kidney disease with stage 1 through stage 4 chronic kidney disease, or unspecified chronic kidney disease: Secondary | ICD-10-CM | POA: Insufficient documentation

## 2021-05-18 DIAGNOSIS — N1831 Chronic kidney disease, stage 3a: Secondary | ICD-10-CM | POA: Diagnosis not present

## 2021-05-18 DIAGNOSIS — Z79899 Other long term (current) drug therapy: Secondary | ICD-10-CM | POA: Insufficient documentation

## 2021-05-18 DIAGNOSIS — Z87891 Personal history of nicotine dependence: Secondary | ICD-10-CM | POA: Diagnosis not present

## 2021-05-18 LAB — CBC WITH DIFFERENTIAL/PLATELET
Abs Immature Granulocytes: 0.15 10*3/uL — ABNORMAL HIGH (ref 0.00–0.07)
Basophils Absolute: 0.1 10*3/uL (ref 0.0–0.1)
Basophils Relative: 1 %
Eosinophils Absolute: 0.2 10*3/uL (ref 0.0–0.5)
Eosinophils Relative: 1 %
HCT: 41.1 % (ref 36.0–46.0)
Hemoglobin: 13.7 g/dL (ref 12.0–15.0)
Immature Granulocytes: 1 %
Lymphocytes Relative: 36 %
Lymphs Abs: 4.6 10*3/uL — ABNORMAL HIGH (ref 0.7–4.0)
MCH: 30.4 pg (ref 26.0–34.0)
MCHC: 33.3 g/dL (ref 30.0–36.0)
MCV: 91.1 fL (ref 80.0–100.0)
Monocytes Absolute: 0.8 10*3/uL (ref 0.1–1.0)
Monocytes Relative: 6 %
Neutro Abs: 6.9 10*3/uL (ref 1.7–7.7)
Neutrophils Relative %: 55 %
Platelets: 297 10*3/uL (ref 150–400)
RBC: 4.51 MIL/uL (ref 3.87–5.11)
RDW: 12.3 % (ref 11.5–15.5)
WBC: 12.6 10*3/uL — ABNORMAL HIGH (ref 4.0–10.5)
nRBC: 0 % (ref 0.0–0.2)

## 2021-05-18 LAB — COMPREHENSIVE METABOLIC PANEL
ALT: 51 U/L — ABNORMAL HIGH (ref 0–44)
AST: 25 U/L (ref 15–41)
Albumin: 3.7 g/dL (ref 3.5–5.0)
Alkaline Phosphatase: 81 U/L (ref 38–126)
Anion gap: 5 (ref 5–15)
BUN: 16 mg/dL (ref 6–20)
CO2: 26 mmol/L (ref 22–32)
Calcium: 9.3 mg/dL (ref 8.9–10.3)
Chloride: 106 mmol/L (ref 98–111)
Creatinine, Ser: 1.05 mg/dL — ABNORMAL HIGH (ref 0.44–1.00)
GFR, Estimated: >60 mL/min (ref >60)
Glucose, Bld: 116 mg/dL — ABNORMAL HIGH (ref 70–99)
Potassium: 4.1 mmol/L (ref 3.5–5.1)
Sodium: 137 mmol/L (ref 135–145)
Total Bilirubin: 0.7 mg/dL (ref 0.3–1.2)
Total Protein: 6.8 g/dL (ref 6.5–8.1)

## 2021-05-18 NOTE — ED Triage Notes (Addendum)
Patient presents with nose bleed since Friday. Patient had left hip surgery last Tuesday 05/12/21

## 2021-05-18 NOTE — ED Provider Notes (Signed)
[facility]-EMERGENCY DEPARTMENT  ____________________________________________  Time seen: Approximately 8:56 PM  I have reviewed the triage vital signs and the nursing notes.   HISTORY  Chief Complaint Epistaxis   Historian Patient     HPI Abigail [name] is a 55 y.o. female presents to the emergency department with resolved epistaxis.  Patient reports that she has had epistaxis intermittently since Friday when she had an endoscopic procedure for trochanteric bursectomy.  Patient states that she was taking aspirin but has since stopped medication.  She reports that she can stop her nosebleeds on her own with ice and nasal clipping.  She denies use of nasal sprays or other new medications.  Denies a history of nosebleeds in the past.  No traumas to the face.  No other alleviating measures have been attempted.   Past Medical History:  Diagnosis Date   Adrenal cortex insufficiency (HCC)    Anxiety    Arthritis    arthritis-back, weakness in right leg   Asthma    Chronic kidney disease    STAGE iiiA   Complication of anesthesia    migraine headache when come out of anesthesia   Depression    Family history of adverse reaction to anesthesia    mom n/v   Fibromyalgia    Headache(784.0)    occ. migraines, not frequent   Hyperlipidemia    Hypertension    Lumbar radiculopathy    Myofascial pain syndrome, cervical    Occasional tremors    takes Inderal for this   Osteoarthritis    Osteoporosis    PONV (postoperative nausea and vomiting)    states patch works   Syncope      Immunizations up to date:  Yes.     Past Medical History:  Diagnosis Date   Adrenal cortex insufficiency (HCC)    Anxiety    Arthritis    arthritis-back, weakness in right leg   Asthma    Chronic kidney disease    STAGE iiiA   Complication of anesthesia    migraine headache when come out of anesthesia   Depression    Family history of adverse reaction to anesthesia    mom n/v    Fibromyalgia    Headache(784.0)    occ. migraines, not frequent   Hyperlipidemia    Hypertension    Lumbar radiculopathy    Myofascial pain syndrome, cervical    Occasional tremors    takes Inderal for this   Osteoarthritis    Osteoporosis    PONV (postoperative nausea and vomiting)    states patch works   Syncope     Patient Active Problem List   Diagnosis Date Noted   Postlaminectomy syndrome, lumbar (Bilateral L5/S1 [place]) 08/29/2019   Myofascial pain 08/29/2019   Primary osteoarthritis of left knee 08/29/2019   Primary osteoarthritis of right knee 06/11/2019   Marijuana use 01/01/2019   Chronic pain of right knee 12/26/2018   Chronic pain syndrome 12/26/2018   Long term prescription benzodiazepine use 12/26/2018   Opiate use 12/26/2018   Pharmacologic therapy 12/26/2018   Disorder of skeletal system 12/26/2018   Problems influencing health status 12/26/2018   Cervical radiculopathy 10/18/2018   Nicotine use disorder 09/13/2018   Neck pain, chronic 06/19/2018   Adrenal insufficiency ([place]) 05/25/2018   Lightheadedness 02/08/2018   Enlarged pituitary gland ([place]) 01/11/2018   Pain of left hip joint 12/27/2017   Headache disorder 10/18/2017   Carpal tunnel syndrome, bilateral upper limbs 10/10/2017   Chronic bilateral low back  pain without sciatica 09/28/2017   Mixed incontinence 09/28/2017   Weakness 09/22/2017   Chronic daily headache 09/14/2017   Sleeping difficulty 09/14/2017   Snoring 09/14/2017   Osteoarthritis of multiple joints 03/11/2016   Depression 02/12/2016   Benign essential hypertension 02/12/2016   Insomnia 02/12/2016   Pure hypercholesterolemia 02/12/2016   TIA (transient ischemic attack) 06/02/2014   Hypertension 06/02/2014   Decreased sensation of foot 06/02/2014   Herniated lumbar intervertebral disc 09/28/2013   Recurrent herniation of lumbar disc 09/27/2013   Spinal stenosis, lumbar region, with neurogenic claudication 03/07/2013   Chronic  radicular lumbar pain 03/07/2013   Intervertebral disc disorder with myelopathy, lumbar region 07/13/2012   Spinal stenosis, lumbar 07/13/2012    Past Surgical History:  Procedure Laterality Date   ABDOMINAL HYSTERECTOMY  14 yrs ago   ANTERIOR CERVICAL DECOMPRESSION/DISCECTOMY FUSION 4 LEVELS N/A 10/18/2018   Procedure: ANTERIOR CERVICAL DECOMPRESSION/DISCECTOMY FUSION C 2-5;  Surgeon: Meade Maw, MD;  Location: [facility] ORS;  Service: Neurosurgery;  Laterality: N/A;   BACK SURGERY     x 3.  Plate in neck   BREAST BIOPSY Left 04/10/2015   BENIGN BREAST TISSUE   CARPAL TUNNEL RELEASE Left 03/22/2018   CARPAL TUNNEL RELEASE Right 02/08/2018   CHOLECYSTECTOMY     EXCISION/RELEASE BURSA HIP Left 05/12/2021   Procedure: Left hip endoscopic trochanteric bursectomy and IT band release;  Surgeon: Leim Fabry, MD;  Location: [facility];  Service: Orthopedics;  Laterality: Left;   EYE SURGERY     lasik   HEMI-MICRODISCECTOMY LUMBAR LAMINECTOMY LEVEL 1 N/A 09/28/2013   Procedure: HEMI-MICRODISCECTOMY LUMBAR LAMINECTOMY L5-S1 LEFT;  Surgeon: Tobi Bastos, MD;  Location: [facility];  Service: Orthopedics;  Laterality: N/A;   KNEE ARTHROSCOPY WITH LATERAL RELEASE Left 01/03/2020   Procedure: KNEE ARTHROSCOPY WITH LATERAL RELEASE FOR PATELLA SUBLUXATION;  Surgeon: Hessie Knows, MD;  Location: [facility];  Service: Orthopedics;  Laterality: Left;   KNEE SURGERY Left    dr. Holley Raring performed procedure. dr. Rudene Christians then did an arthroscopy   left foot bone spur surgery  10 yrs ago   LUMBAR LAMINECTOMY/DECOMPRESSION MICRODISCECTOMY  07/13/2012   Procedure: LUMBAR LAMINECTOMY/DECOMPRESSION MICRODISCECTOMY 1 LEVEL;  Surgeon: Tobi Bastos, MD;  Location: [facility];  Service: Orthopedics;  Laterality: N/A;  Central Decompression Lumbar Laminectomy L5 - S1 (X-Ray)   LUMBAR LAMINECTOMY/DECOMPRESSION MICRODISCECTOMY Right 03/07/2013   Procedure: LUMBAR DECOMPRESSIVE HEMI LAMINECTOMY AND  MICRODISCECTOMY  L5-S1  RIGHT ;  Surgeon: Tobi Bastos, MD;  Location: [facility];  Service: Orthopedics;  Laterality: Right;   PULSE GENERATOR IMPLANT Left 03/31/2020   Procedure: PLACEMENT OF LEFT FLANK PULSE GENERATOR;  Surgeon: Deetta Perla, MD;  Location: [facility];  Service: Neurosurgery;  Laterality: Left;   SPINAL CORD STIMULATOR INSERTION N/A 11/28/2019   Procedure: LUMBAR SPINAL CORD STIMULATOR INSERTION;  Surgeon: Meade Maw, MD;  Location: [facility];  Service: Neurosurgery;  Laterality: N/A;   SPINAL CORD STIMULATOR INSERTION N/A 03/31/2020   Procedure: INSERTION CERVICAL SPINAL STIMULATOR;  Surgeon: Deetta Perla, MD;  Location: [facility];  Service: Neurosurgery;  Laterality: N/A;    Prior to Admission medications   Medication Sig Start Date End Date Taking? Authorizing Provider  acetaminophen (TYLENOL) 500 MG tablet Take 2 tablets (1,000 mg total) by mouth every 8 (eight) hours. 05/12/21 05/12/22  Leim Fabry, MD  amLODipine (NORVASC) 5 MG tablet Take 5 mg by mouth in the morning.    [provider]  aspirin EC 325 MG tablet Take 1 tablet (325 mg  total) by mouth daily for 14 days. 05/12/21 05/26/21  Leim Fabry, MD  atorvastatin (LIPITOR) 20 MG tablet Take 20 mg by mouth in the morning.    [provider]  Biotin 1000 MCG tablet Take 1,000 mcg by mouth in the morning.    [provider]  celecoxib (CELEBREX) 200 MG capsule Take 200 mg by mouth 2 (two) times daily. 04/27/21   [provider]  Cholecalciferol 25 MCG (1000 UT) tablet Take 1,000 Units by mouth in the morning.    [provider]  clonazePAM (KLONOPIN) 0.5 MG tablet Take 0.25 mg by mouth 3 (three) times daily as needed for anxiety.  06/28/19   [provider]  DULoxetine (CYMBALTA) 60 MG capsule Take 60 mg by mouth 2 (two) times daily. 09/09/16   [provider]  HYDROcodone-acetaminophen (NORCO) 5-325 MG tablet Take 1-2 tablets by mouth every 4 (four) hours as needed for moderate pain  or severe pain. 05/12/21   Leim Fabry, MD  meclizine (ANTIVERT) 12.5 MG tablet Take 12.5 mg by mouth in the morning.    [provider]  nortriptyline (PAMELOR) 50 MG capsule Take 100 mg by mouth at bedtime.  06/28/19   [provider]  olmesartan (BENICAR) 20 MG tablet Take 20 mg by mouth in the morning. 02/29/20   [provider]  ondansetron (ZOFRAN ODT) 4 MG disintegrating tablet Take 1 tablet (4 mg total) by mouth every 8 (eight) hours as needed for nausea or vomiting. 05/12/21   Leim Fabry, MD  predniSONE (DELTASONE) 1 MG tablet Take 1 mg by mouth in the morning.    [provider]  propranolol (INDERAL) 20 MG tablet Take 20 mg by mouth 2 (two) times daily.  11/07/19   [provider]  topiramate (TOPAMAX) 50 MG tablet Take 50-100 mg by mouth See admin instructions. Take 1 tablet (50 mg) by mouth in the morning & take 2 tablets (100 mg) by mouth at night. 02/11/21   [provider]    Allergies Patient has no known allergies.  Family History  Problem Relation Age of Onset   Osteoporosis Mother    Heart disease Father    Hypertension Father    Cystic fibrosis Brother    Ataxia Neg Hx    [place]orea Neg Hx    Dementia Neg Hx    Mental retardation Neg Hx    Migraines Neg Hx    Multiple sclerosis Neg Hx    Neurofibromatosis Neg Hx    Neuropathy Neg Hx    Parkinsonism Neg Hx    Seizures Neg Hx    Stroke Neg Hx     Social History Social History   Tobacco Use   Smoking status: Former    Packs/day: 0.50    Years: 21.00    Pack years: 10.50    Types: Cigarettes    Quit date: 04/29/2021    Years since quitting: 0.0   Smokeless tobacco: Never   Tobacco comments:    pt smokes marijuana everyday  Vaping Use   Vaping Use: Never used  Substance Use Topics   Alcohol use: Not Currently   Drug use: Yes    Frequency: 7.0 times per week    Types: Marijuana    Comment: States she uses everyday for pain     Review of Systems   Constitutional: No fever/chills Eyes:  No discharge ENT: Patient has epistaxis.  Respiratory: no cough. No SOB/ use of accessory muscles to breath Gastrointestinal:  No nausea, no vomiting.  No diarrhea.  No constipation. Musculoskeletal: Negative for musculoskeletal pain.* Skin: Negative for rash, abrasions, lacerations, ecchymosis.   ____________________________________________   PHYSICAL EXAM:  VITAL SIGNS: ED Triage Vitals  Enc Vitals Group     BP 05/18/21 1716 111/74     Pulse Rate 05/18/21 1716 88     Resp 05/18/21 1716 18     Temp 05/18/21 1716 97.7 F (36.5 C)     Temp Source 05/18/21 1716 Oral     SpO2 05/18/21 1716 96 %     Weight 05/18/21 1807 145 lb (65.8 kg)     Height 05/18/21 1807 '5\' 5"'$  (1.651 m)     Head Circumference --      Peak Flow --      Pain Score 05/18/21 1806 8     Pain Loc --      Pain Edu? --      Excl. in [place]? --      Constitutional: Alert and oriented. Well appearing and in no acute distress. Eyes: Conjunctivae are normal. PERRL. EOMI. Head: Atraumatic. ENT:      Nose: No congestion/rhinnorhea.  No active epistaxis.      Mouth/Throat: Mucous membranes are moist.  Neck: No stridor.  No cervical spine tenderness to palpation. Cardiovascular: Normal rate, regular rhythm. Normal S1 and S2.  Good peripheral circulation. Respiratory: Normal respiratory effort without tachypnea or retractions. Lungs CTAB. Good air entry to the bases with no decreased or absent breath sounds Gastrointestinal: Bowel sounds x 4 quadrants. Soft and nontender to palpation. No guarding or rigidity. No distention. Musculoskeletal: Full range of motion to all extremities. No obvious deformities noted Neurologic:  Normal for age. No gross focal neurologic deficits are appreciated.  Skin:  Skin is warm, dry and intact. No rash noted. Psychiatric: Mood and affect are normal for age. Speech and behavior are normal.   ____________________________________________    LABS (all labs ordered are listed, but only abnormal results are displayed)  Labs Reviewed  CBC WITH DIFFERENTIAL/PLATELET - Abnormal; Notable for the following components:      Result Value   WBC 12.6 (*)    Lymphs Abs 4.6 (*)    Abs Immature Granulocytes 0.15 (*)    All other components within normal limits  COMPREHENSIVE METABOLIC PANEL - Abnormal; Notable for the following components:   Glucose, Bld 116 (*)    Creatinine, Ser 1.05 (*)    ALT 51 (*)    All other components within normal limits   ____________________________________________  EKG   ____________________________________________  RADIOLOGY   No results found.  ____________________________________________    PROCEDURES  Procedure(s) performed:     Procedures     Medications - No data to display   ____________________________________________   INITIAL IMPRESSION / ASSESSMENT AND PLAN / ED COURSE  Pertinent labs & imaging results that were available during my care of the patient were reviewed by me and considered in my medical decision making (see chart for details).    Assessment and plan Epistaxis:  55 year old female presents to the emergency department with seemingly resolved epistaxis. Exam vital signs are reassuring at triage.  On physical exam, patient had no active bleeding and was resting comfortably.  H&H were reassuring on CBC with no thrombocytopenia.  Patient education regarding nasal clipping and the use of Afrin at home were given during this emergency department encounter.  Return precautions were given to return with new or worsening symptoms.      ____________________________________________  FINAL CLINICAL IMPRESSION(S) / ED DIAGNOSES  Final diagnoses:  Epistaxis      NEW MEDICATIONS STARTED DURING THIS VISIT:  ED Discharge Orders     None           This chart was dictated using voice recognition software/Dragon. Despite best efforts to proofread,  errors can occur which can change the meaning. Any change was purely unintentional.     Karren Cobble 05/18/21 2101    Merlyn Lot, MD 05/18/21 2158

## 2021-06-11 ENCOUNTER — Other Ambulatory Visit: Payer: Self-pay | Admitting: Family Medicine

## 2021-06-11 DIAGNOSIS — Z1231 Encounter for screening mammogram for malignant neoplasm of breast: Secondary | ICD-10-CM

## 2021-06-26 ENCOUNTER — Other Ambulatory Visit: Payer: Self-pay

## 2021-06-26 ENCOUNTER — Ambulatory Visit
Admission: RE | Admit: 2021-06-26 | Discharge: 2021-06-26 | Disposition: A | Discharge status: Home / Self Care | Payer: Medicare Other | Source: Ambulatory Visit | Attending: Family Medicine | Admitting: Family Medicine

## 2021-06-26 DIAGNOSIS — Z1231 Encounter for screening mammogram for malignant neoplasm of breast: Secondary | ICD-10-CM | POA: Insufficient documentation

## 2021-07-31 ENCOUNTER — Other Ambulatory Visit: Payer: Self-pay | Admitting: Orthopedic Surgery

## 2021-07-31 ENCOUNTER — Other Ambulatory Visit (HOSPITAL_COMMUNITY): Payer: Self-pay | Admitting: Orthopedic Surgery

## 2021-07-31 DIAGNOSIS — M5432 Sciatica, left side: Secondary | ICD-10-CM

## 2021-08-12 ENCOUNTER — Other Ambulatory Visit: Payer: Self-pay

## 2021-08-12 ENCOUNTER — Ambulatory Visit
Admission: RE | Admit: 2021-08-12 | Discharge: 2021-08-12 | Disposition: A | Discharge status: Home / Self Care | Payer: Medicare Other | Source: Ambulatory Visit | Attending: Orthopedic Surgery | Admitting: Orthopedic Surgery

## 2021-08-12 DIAGNOSIS — M5432 Sciatica, left side: Secondary | ICD-10-CM | POA: Diagnosis not present

## 2021-09-01 ENCOUNTER — Ambulatory Visit
Payer: Medicare Other | Attending: Student in an Organized Health Care Education/Training Program | Admitting: Student in an Organized Health Care Education/Training Program

## 2021-09-01 ENCOUNTER — Other Ambulatory Visit: Payer: Self-pay

## 2021-09-01 ENCOUNTER — Encounter: Payer: Self-pay | Admitting: Student in an Organized Health Care Education/Training Program

## 2021-09-01 VITALS — BP 95/84 | HR 89 | Temp 97.2°F | Resp 16 | Ht 64.0 in | Wt 165.0 lb

## 2021-09-01 DIAGNOSIS — M48061 Spinal stenosis, lumbar region without neurogenic claudication: Secondary | ICD-10-CM

## 2021-09-01 DIAGNOSIS — M961 Postlaminectomy syndrome, not elsewhere classified: Secondary | ICD-10-CM | POA: Diagnosis not present

## 2021-09-01 DIAGNOSIS — G8929 Other chronic pain: Secondary | ICD-10-CM

## 2021-09-01 DIAGNOSIS — M5416 Radiculopathy, lumbar region: Secondary | ICD-10-CM | POA: Diagnosis not present

## 2021-09-01 NOTE — Progress Notes (Signed)
Safety precautions to be maintained throughout the outpatient stay will include: orient to surroundings, keep bed in low position, maintain call bell within reach at all times, provide assistance with transfer out of bed and ambulation.  

## 2021-09-01 NOTE — Patient Instructions (Signed)
Pre procedure instructions given for po valium.  NPO x 8 hours, bring a driver.  Selective Nerve Root Block Patient Information  Description: Specific nerve roots exit the spinal canal and these nerves can be compressed and inflamed by a bulging disc and bone spurs.  By injecting steroids on the nerve root, we can potentially decrease the inflammation surrounding these nerves, which often leads to decreased pain.  Also, by injecting local anesthesia on the nerve root, this can provide [place] helpful information to give to your referring doctor if it decreases your pain.  Selective nerve root blocks can be done along the spine from the neck to the low back depending on the location of your pain.   After numbing the skin with local anesthesia, a small needle is passed to the nerve root and the position of the needle is verified using x-ray pictures.  After the needle is in correct position, we then deposit the medication.  You may experience a pressure sensation while this is being done.  The entire block usually lasts less than 15 minutes.  Conditions that may be treated with selective nerve root blocks: Low back and leg pain Spinal stenosis Diagnostic block prior to potential surgery Neck and arm pain Post laminectomy syndrome  Preparation for the injection:  Do not eat any solid food or dairy products within 8 hours of your appointment. You may drink clear liquids up to 3 hours before an appointment.  Clear liquids include water, black coffee, juice or soda.  No milk or cream please. You may take your regular medications, including pain medications, with a sip of water before your appointment.  Diabetics should hold regular insulin (if taken separately) and take 1/2 normal NPH dose the morning of the procedure.  Carry some sugar containing items with you to your appointment. A driver must accompany you and be prepared to drive you home after your procedure. Bring all your current medications with  you. An IV may be inserted and sedation may be given at the discretion of the physician. A blood pressure cuff, EKG, and other monitors will often be applied during the procedure.  Some patients may need to have extra oxygen administered for a short period. You will be asked to provide medical information, including allergies, prior to the procedure.  We must know immediately if you are taking blood  Thinners (like Coumadin) or if you are allergic to IV iodine contrast (dye).  Possible side-effects: All are usually temporary Bleeding from needle site Light headedness Numbness and tingling Decreased blood pressure Weakness in arms/legs Pressure sensation in back/neck Pain at injection site (several days)  Possible complications: All are extremely rare Infection Nerve injury Spinal headache (a headache wore with upright position)  Call if you experience: Fever/chills associated with headache or increased back/neck pain Headache worsened by an upright position New onset weakness or numbness of an extremity below the injection site Hives or difficulty breathing (go to the emergency room) Inflammation or drainage at the injection site(s) Severe back/neck pain greater than usual New symptoms which are concerning to you  Please note:  Although the local anesthetic injected can often make your back or neck feel good for several hours after the injection the pain will likely return.  It takes 3-5 days for steroids to work on the nerve root. You may not notice any pain relief for at least one week.  If effective, we will often do a series of 3 injections spaced 3-6 weeks apart to  maximally decrease your pain.    If you have any questions, please call [phone] [facility]

## 2021-09-01 NOTE — Progress Notes (Signed)
Patient: Abigail [name]  Service Category: E/M  Provider: Gillis Santa, MD  DOB: 30-Dec-1965  DOS: 09/01/2021  Referring Provider: Meade Maw, MD  MRN: [id number]  Setting: Ambulatory outpatient  PCP: Derinda Late, MD  Type: New Patient  Specialty: Interventional Pain Management    Location: Office  Delivery: Face-to-face     Primary Reason(s) for Visit: Encounter for initial evaluation of one or more chronic problems (new to examiner) potentially causing chronic pain, and posing a threat to normal musculoskeletal function. (Level of risk: High) CC: Hip Pain (left) (NEW PROBLEM, last visit > 1 year ago)  HPI  Abigail [name] is a 55 y.o. year old, female patient, who comes for the first time to our practice referred by Meade Maw, MD for our initial evaluation of her chronic pain. She has Intervertebral disc disorder with myelopathy, lumbar region; Spinal stenosis, lumbar; Spinal stenosis, lumbar region, with neurogenic claudication; Chronic radicular lumbar pain; Recurrent herniation of lumbar disc; Herniated lumbar intervertebral disc; TIA (transient ischemic attack); Hypertension; Decreased sensation of foot; Cervical radiculopathy; Adrenal insufficiency ([place]); Depression; Benign essential hypertension; Carpal tunnel syndrome, bilateral upper limbs; Chronic bilateral low back pain without sciatica; Chronic daily headache; Enlarged pituitary gland ([place]); Headache disorder; Insomnia; Lightheadedness; Mixed incontinence; Neck pain, chronic; Nicotine use disorder; Osteoarthritis of multiple joints; Pain of left hip joint; Pure hypercholesterolemia; Sleeping difficulty; Snoring; Weakness; Chronic pain of right knee; Chronic pain syndrome; Long term prescription benzodiazepine use; Opiate use; Pharmacologic therapy; Disorder of skeletal system; Problems influencing health status; Marijuana use; Primary osteoarthritis of right knee; Postlaminectomy syndrome, lumbar (Bilateral L5/S1 [place]);  Myofascial pain; and Primary osteoarthritis of left knee on their problem list. Today she comes in for evaluation of her Hip Pain (left)  Pain Assessment: Location: Left Hip Radiating: to left foot Onset: More than a month ago Duration: Chronic pain Quality: Pins and needles Severity: 5 /10 (subjective, self-reported pain score)  Effect on ADL: limits daily activities Timing: Constant Modifying factors:   BP: 95/84  HR: 89  Onset and Duration: Gradual and Present longer than 3 months Cause of pain: Surgery Severity: Getting worse, NAS-11 at its worse: 10/10, NAS-11 at its best: 3/10, NAS-11 now: 4/10, and NAS-11 on the average: 3/10 Timing: Night and Not influenced by the time of the day Aggravating Factors:  worse at night Alleviating Factors:  nothing Associated Problems: Pain that wakes patient up and Pain that does not allow patient to sleep Quality of Pain: Aching Previous Examinations or Tests: MRI scan, X-rays, and Neurosurgical evaluation Previous Treatments: Spinal cord stimulator  Anjulie Dipierro last saw me over 1 year ago.  She presents today with a new pain complaint of left low back and radiating leg pain.  She is being referred for a fast-track left L5 selective nerve root block per Dr. Cari Caraway.  Her last visit with me involved a spinal cord stimulator trial in her thoracolumbar region which was successful and she went on to have a permanent implant.  Subsequently she went on to have a cervical spinal cord stimulator implant as well.  She has both of these in place and they help manage her pain to a certain extent.  Of note she is status post left hip iliotibial band release and left hip trochanteric bursectomy on 05/12/2021.  She states that this helped out with her focal left hip pain but over the last couple of months she has been having increased pain from her buttock running down her left leg to the bottom of  her foot.  Meds   Current Outpatient Medications:     acetaminophen (TYLENOL) 500 MG tablet, Take 2 tablets (1,000 mg total) by mouth every 8 (eight) hours., Disp: 90 tablet, Rfl: 2   amLODipine (NORVASC) 5 MG tablet, Take 5 mg by mouth in the morning., Disp: , Rfl:    atorvastatin (LIPITOR) 20 MG tablet, Take 20 mg by mouth in the morning., Disp: , Rfl:    Biotin 1000 MCG tablet, Take 1,000 mcg by mouth in the morning., Disp: , Rfl:    celecoxib (CELEBREX) 200 MG capsule, Take 200 mg by mouth 2 (two) times daily., Disp: , Rfl:    Cholecalciferol 25 MCG (1000 UT) tablet, Take 1,000 Units by mouth in the morning., Disp: , Rfl:    clonazePAM (KLONOPIN) 0.5 MG tablet, Take 0.25 mg by mouth 3 (three) times daily as needed for anxiety. , Disp: , Rfl:    DULoxetine (CYMBALTA) 60 MG capsule, Take 60 mg by mouth 2 (two) times daily., Disp: , Rfl:    meclizine (ANTIVERT) 12.5 MG tablet, Take 12.5 mg by mouth in the morning., Disp: , Rfl:    nortriptyline (PAMELOR) 50 MG capsule, Take 100 mg by mouth at bedtime. , Disp: , Rfl:    olmesartan (BENICAR) 20 MG tablet, Take 20 mg by mouth in the morning., Disp: , Rfl:    ondansetron (ZOFRAN ODT) 4 MG disintegrating tablet, Take 1 tablet (4 mg total) by mouth every 8 (eight) hours as needed for nausea or vomiting., Disp: 20 tablet, Rfl: 0   predniSONE (DELTASONE) 1 MG tablet, Take 1 mg by mouth in the morning., Disp: , Rfl:    propranolol (INDERAL) 20 MG tablet, Take 20 mg by mouth 2 (two) times daily. , Disp: , Rfl:    topiramate (TOPAMAX) 50 MG tablet, Take 50-100 mg by mouth See admin instructions. Take 1 tablet (50 mg) by mouth in the morning & take 2 tablets (100 mg) by mouth at night., Disp: , Rfl:    HYDROcodone-acetaminophen (NORCO) 5-325 MG tablet, Take 1-2 tablets by mouth every 4 (four) hours as needed for moderate pain or severe pain. (Patient not taking: Reported on 09/01/2021), Disp: 10 tablet, Rfl: 0  Imaging Review  Cervical Imaging: Cervical MR wo contrast: Results for orders placed during the  hospital encounter of 11/19/19  MR CERVICAL SPINE WO CONTRAST  Narrative CLINICAL DATA:  Pain across both shoulders into the upper arms.  EXAM: MRI CERVICAL SPINE WITHOUT CONTRAST  TECHNIQUE: Multiplanar, multisequence MR imaging of the cervical spine was performed. No intravenous contrast was administered.  COMPARISON:  08/04/2013  FINDINGS: Alignment: Slight anterolisthesis at C7-T1.  Vertebrae: No fracture, evidence of discitis, or bone lesion. Interval C2-C5 ACDF with probable solid arthrodesis.  Cord: Normal signal and morphology.  Posterior Fossa, vertebral arteries, paraspinal tissues: Negative  Disc levels:  C1-2: Increased retro dental ligamentous thickening, likely adjacent level manifestation. No cervicomedullary impingement.  C2-3: ACDF.  No impingement  C3-4: Facet osteoarthritis. ACDF with probable arthrodesis. No visible impingement  C4-5: Facet osteoarthritis. ACDF with probable solid arthrodesis. No visible impingement  C5-6: Facet osteoarthritis with prominent spurring asymmetric to the left. Minor disc bulging. No neural impingement-left foramen patency is best demonstrated on axial gradient images.  C6-7: Facet osteoarthritis with spurring that is moderate. Slight anterolisthesis is present. No herniation or impingement  C7-T1:Mild facet spurring  IMPRESSION: 1. Diffuse cervical facet osteoarthritis mild anterolisthesis at C6-7. 2. ACDF from C2-C5 with probable solid arthrodesis. 3. After fusion  there is increased retro dental ligamentous thickening but no cervicomedullary impingement. 4. Diffusely patent spinal canal and foramina.   Electronically Signed By: Monte Fantasia M.D. On: 11/20/2019 04:54  Cervical MR wo contrast: No valid procedures specified. Cervical MR w/wo contrast: No results found for this or any previous visit.  Cervical MR w contrast: Results for orders placed in visit on 09/19/13  MR Cervical Spine W  Contrast  Cervical CT wo contrast: No results found for this or any previous visit.  Cervical CT w/wo contrast: No results found for this or any previous visit.  Cervical CT w/wo contrast: No results found for this or any previous visit.  Cervical CT w contrast: No results found for this or any previous visit.  Cervical CT outside: No results found for this or any previous visit.  Cervical DG 1 view: No results found for this or any previous visit.  Cervical DG 2-3 views: Results for orders placed during the hospital encounter of 03/31/20  DG Cervical Spine 2-3 Views  Narrative CLINICAL DATA:  CERVICAL SPINE stimulator placement.  EXAM: OPERATIVE CERVICAL TO SPINE 2 VIEW(S)  COMPARISON:  None.  FINDINGS: Six images from portable C-arm radiography obtained in the operating room show dorsal column stimulator device placement with leads at the C2 through C5 level where prior ACDF has been performed.  IMPRESSION: Status post dorsal spinal cord stimulator device placement from C2 through C5.   Electronically Signed By: Kerby Moors M.D. On: 03/31/2020 11:12  Cervical DG F/E views: No results found for this or any previous visit.  Cervical DG 2-3 clearing views: No results found for this or any previous visit.  Cervical DG Bending/F/E views: No results found for this or any previous visit.  Cervical DG complete: Results for orders placed during the hospital encounter of 06/06/14  DG Cervical Spine Complete  Narrative CLINICAL DATA:  Right-sided neck pain radiating to the shoulder arm for several weeks. Syncope with fall tonight. Increasing neck pain.  EXAM: CERVICAL SPINE  4+ VIEWS  COMPARISON:  MRI cervical spine 08/04/2013  FINDINGS: Slight anterior subluxation of C3 on C4 was present on previous MRI. Otherwise normal alignment of the cervical spine and facet joints. Degenerative changes throughout the facet joints. C1-2 articulation appears intact.  Intervertebral disc space heights are preserved. No vertebral compression deformities. No prevertebral soft tissue swelling. No focal bone lesion or bone destruction.  IMPRESSION: Slight anterior subluxation of C3 on C4 has been present on previous studies and is likely degenerative. Degenerative changes throughout the facet joints. No acute changes demonstrated.   Electronically Signed By: Lucienne Capers M.D. On: 06/06/2014 23:51    Narrative CLINICAL DATA:  Right shoulder pain for 1.5 months with numbness and tingling. No acute injury or prior relevant surgery.  EXAM: MRI OF THE RIGHT SHOULDER WITHOUT CONTRAST  TECHNIQUE: Multiplanar, multisequence MR imaging of the shoulder was performed. No intravenous contrast was administered.  COMPARISON:  None.  FINDINGS: Despite efforts by the technologist and patient, mild motion artifact is present on today's exam and could not be eliminated. This reduces exam sensitivity and specificity. Some sequences were repeated.  Rotator cuff: Subscapularis tendinosis without evidence of tear. The supraspinatus, infraspinatus and teres minor tendons appear normal.  Muscles:  No focal muscular atrophy or edema.  Biceps long head: Intact and normally positioned. Possible mild tendinosis of the intra-articular portion.  Acromioclavicular Joint: The acromion is type 1. There are mild acromioclavicular degenerative changes. No significant fluid is present in  the subacromial - subdeltoid bursa.  Glenohumeral Joint: No significant shoulder joint effusion or glenohumeral arthropathy.  Labrum:  No evidence of labral tear or paralabral cyst.  Bones: No acute or significant extra-articular osseous findings.  Other: No significant soft tissue findings.  IMPRESSION: 1. Subscapularis tendinosis without evidence of tear. 2. Possible mild tendinosis of the intra-articular portion of the biceps tendon. 3. The labrum and additional  components of the rotator cuff appear normal. 4. Mild acromioclavicular degenerative changes.   Electronically Signed By: Richardean Sale M.D. On: 09/17/2020 13:59   Thoracic Imaging: Thoracic MR wo contrast: Results for orders placed during the hospital encounter of 11/19/19  MR THORACIC SPINE WO CONTRAST  Narrative CLINICAL DATA:  Bilateral arm pain for several years, worsening  EXAM: MRI THORACIC SPINE WITHOUT CONTRAST  TECHNIQUE: Multiplanar, multisequence MR imaging of the thoracic spine was performed. No intravenous contrast was administered.  COMPARISON:  None.  FINDINGS: Alignment:  Physiologic.  Vertebrae: No fracture, evidence of discitis, or bone lesion.  Cord:  Normal signal and morphology.  Paraspinal and other soft tissues: Negative.  Disc levels:  Shallow central or right paracentral disc protrusion at T5-6, T8-9, and T9-10. Mild ventral cord scalloping at the level of the T5-6 herniation, but no out right cord compression - circumferential CSF remains preserved. No significant facet spurring. The foramina are diffusely patent  IMPRESSION: 1. Small disc protrusions at T5-6, T8-9, and T9-10 with mild ventral cord scalloping at T5-6. 2. Diffusely patent foramina.   Electronically Signed By: Monte Fantasia M.D. On: 11/20/2019 05:08   Narrative CLINICAL DATA:  Placement of DORSAL spinal cord stimulating device in this patient with chronic pain syndrome.  EXAM: OPERATIVE THORACIC SPINE 1 VIEW  COMPARISON:  MRI thoracic spine 11/19/2019.  FINDINGS: DORSAL spinal cord stimulating device with the leads centered at the T8 level.  IMPRESSION: DORSAL spinal cord stimulating device with leads centered at the T8 level.   Electronically Signed By: Evangeline Dakin M.D. On: 11/28/2019 14:36   Narrative CLINICAL DATA:  Left hip, knee and foot pain since August 2nd after hip surgery. Patient reports previous lumbar surgery in  2013.  EXAM: MRI LUMBAR SPINE WITHOUT CONTRAST  TECHNIQUE: Multiplanar, multisequence MR imaging of the lumbar spine was performed. No intravenous contrast was administered.  COMPARISON:  MR lumbar 12/10/2018; X-ray lumbar 05/02/2015.  FINDINGS: Segmentation:  Standard.  Alignment:  2 mm retrolisthesis of L4 on L5 and L5 on S1.  Vertebrae: No acute fracture, evidence of discitis, or aggressive bone lesion.  Conus medullaris and cauda equina: Conus extends to the L1 level. Conus and cauda equina appear normal.  Paraspinal and other soft tissues: No acute paraspinal abnormality.  Disc levels:  Disc spaces: Degenerative disease with disc height loss at L5-S1.  T12-L1: No significant disc bulge. No neural foraminal stenosis. No central canal stenosis.  L1-L2: No significant disc bulge. No neural foraminal stenosis. No central canal stenosis.  L2-L3: No significant disc bulge. No neural foraminal stenosis. No central canal stenosis.  L3-L4: No significant disc bulge. No neural foraminal stenosis. No central canal stenosis.  L4-L5: Broad-based disc bulge. No foraminal or central canal stenosis. Mild bilateral facet arthropathy.  L5-S1: Broad-based disc osteophyte complex. Mild bilateral facet arthropathy. Mild right and moderate left foraminal stenosis. Prior laminectomy.  IMPRESSION: 1. Stable lower lumbar spine spondylosis as described above. 2.  No acute osseous injury of the lumbar spine.   Electronically Signed By: Kathreen Devoid M.D. On: 08/13/2021 15:34 DG Lumbar  Spine 2-3 Views  Narrative CLINICAL DATA:  Lumbar disc protrusion.  EXAM: LUMBAR SPINE - 2-3 VIEW  COMPARISON:  MRI dated 09/24/2013  FINDINGS: There is marked narrowing of the L5-S1 disc space. The remainder of the lumbar spine is normal.  IMPRESSION: Degenerative disc disease at L5-S1. Vertebra are labeled for intraoperative comparison.   Electronically Signed By: Rozetta Nunnery  M.D. On: 09/27/2013 14:14  Narrative CLINICAL DATA:  Chronic bilateral hip pain.  EXAM: MR OF THE LEFT HIP WITHOUT CONTRAST  MR OF THE RIGHT HIP WITHOUT CONTRAST  TECHNIQUE: Multiplanar, multisequence MR imaging was performed. No intravenous contrast was administered.  COMPARISON:  CT abdomen pelvis dated March 21, 2013.  FINDINGS: Bones: There is no evidence of acute fracture, dislocation or avascular necrosis. The visualized bony pelvis appears normal. The visualized sacroiliac joints and symphysis pubis appear normal. Degenerative disc disease at L5-S1.  Articular cartilage and labrum  Articular cartilage: No focal chondral defect or subchondral signal abnormality identified.  Labrum: Partial tear of the left anterior superior labrum. The right labrum is intact.  Joint or bursal effusion  Joint effusion: No significant hip joint effusion.  Bursae: No focal periarticular fluid collection.  Muscles and tendons  Muscles and tendons: The visualized gluteus, hamstring and iliopsoas tendons appear normal. No muscle edema or atrophy.  Other findings  Miscellaneous: Prior hysterectomy. The visualized internal pelvic contents otherwise appear unremarkable.  IMPRESSION: 1. Partial tear of the left anterior superior labrum. 2. No acute osseous abnormality or significant degenerative changes.   Electronically Signed By: Titus Dubin M.D. On: 06/20/2018 16:25  Hip-L MR wo contrast: Results for orders placed during the hospital encounter of 04/14/21  MR HIP LEFT WO CONTRAST  Narrative CLINICAL DATA:  Chronic left hip pain.  Recent fall 2 months ago.  EXAM: MR OF THE LEFT HIP WITHOUT CONTRAST  TECHNIQUE: Multiplanar, multisequence MR imaging was performed. No intravenous contrast was administered.  COMPARISON:  MRI of the bilateral hips dated June 20, 2018.  FINDINGS: Bones: There is no evidence of acute fracture, dislocation or avascular necrosis.  No focal bone lesion. The visualized sacroiliac joints and symphysis pubis appear normal. Similar degenerative disc disease at L5-S1.  Articular cartilage and labrum  Articular cartilage: No focal chondral defect or subchondral signal abnormality identified.  Labrum: Left anterior superior labral tear again noted. No paralabral abnormality.  Joint or bursal effusion  Joint effusion: No significant hip joint effusion.  Bursae: No focal periarticular fluid collection.  Muscles and tendons  Muscles and tendons: The visualized gluteus, hamstring and iliopsoas tendons appear normal. No muscle edema or atrophy.  Other findings  Miscellaneous: Prior hysterectomy. The visualized internal pelvic contents appear unremarkable.  IMPRESSION: 1. Unchanged left anterior superior labral tear. 2. No acute osseous abnormality or significant degenerative changes.   Electronically Signed By: Titus Dubin M.D. On: 04/14/2021 20:45  Narrative CLINICAL DATA:  Chronic left knee pain. History of prior arthroscopy in March 2021. Recent fall a few weeks ago.  EXAM: MRI OF THE LEFT KNEE WITHOUT CONTRAST  TECHNIQUE: Multiplanar, multisequence MR imaging of the knee was performed. No intravenous contrast was administered.  COMPARISON:  MRI left knee dated December 07, 2018.  FINDINGS: MENISCI  Medial meniscus:  Intact.  Lateral meniscus:  Intact.  LIGAMENTS  Cruciates:  Intact ACL and PCL.  Collaterals: Medial collateral ligament is intact. Lateral collateral ligament complex is intact.  CARTILAGE  Patellofemoral: Similar near full-thickness cartilage loss and fissuring over the medial patellar facet  with trace subchondral marrow edema.  Medial:  No chondral defect.  Lateral:  No chondral defect.  Joint: Small joint effusion. Similar edema in the superolateral aspect of Hoffa's fat.  Popliteal Fossa:  No Baker cyst. Intact popliteus tendon.  Extensor Mechanism:  Intact quadriceps tendon and patellar tendon. Intact medial and lateral patellar retinaculum. Intact MPFL.  Bones: No focal marrow signal abnormality. No fracture or dislocation.  Other: None.  IMPRESSION: 1. No meniscal or ligamentous injury. 2. Similar edema in the superolateral aspect of Hoffa's fat pad, which can be seen in the setting of patellar tendon-lateral femoral condyle friction syndrome. 3. Similar near full-thickness cartilage loss and fissuring over the medial patellar facet.   Electronically Signed By: Titus Dubin M.D. On: 03/25/2021 16:16  Complexity Note: Imaging results reviewed. Results shared with Abigail [name], using Layman's terms.                         ROS  Cardiovascular: High blood pressure Pulmonary or Respiratory: No reported pulmonary signs or symptoms such as wheezing and difficulty taking a deep full breath (Asthma), difficulty blowing air out (Emphysema), coughing up mucus (Bronchitis), persistent dry cough, or temporary stoppage of breathing during sleep Neurological: No reported neurological signs or symptoms such as seizures, abnormal skin sensations, urinary and/or fecal incontinence, being born with an abnormal open spine and/or a tethered spinal cord Psychological-Psychiatric: Anxiousness Gastrointestinal: No reported gastrointestinal signs or symptoms such as vomiting or evacuating blood, reflux, heartburn, alternating episodes of diarrhea and constipation, inflamed or scarred liver, or pancreas or irrregular and/or infrequent bowel movements Genitourinary: No reported renal or genitourinary signs or symptoms such as difficulty voiding or producing urine, peeing blood, non-functioning kidney, kidney stones, difficulty emptying the bladder, difficulty controlling the flow of urine, or chronic kidney disease Hematological: No reported hematological signs or symptoms such as prolonged bleeding, low or poor functioning platelets, bruising or  bleeding easily, hereditary bleeding problems, low energy levels due to low hemoglobin or being anemic Endocrine: No reported endocrine signs or symptoms such as high or low blood sugar, rapid heart rate due to high thyroid levels, obesity or weight gain due to slow thyroid or thyroid disease Rheumatologic: Joint aches and or swelling due to excess weight (Osteoarthritis) Musculoskeletal: Negative for myasthenia gravis, muscular dystrophy, multiple sclerosis or malignant hyperthermia Work History: Disabled  Allergies  Abigail [name] has No Known Allergies.  Laboratory Chemistry Profile   Renal Lab Results  Component Value Date   BUN 16 05/18/2021   CREATININE 1.05 (H) 05/18/2021   BCR 14 12/26/2018   GFRAA >60 03/27/2020   GFRNONAA >60 05/18/2021   PROTEINUR NEGATIVE 03/27/2020     Electrolytes Lab Results  Component Value Date   NA 137 05/18/2021   K 4.1 05/18/2021   CL 106 05/18/2021   CALCIUM 9.3 05/18/2021   MG 2.1 12/26/2018     Hepatic Lab Results  Component Value Date   AST 25 05/18/2021   ALT 51 (H) 05/18/2021   ALBUMIN 3.7 05/18/2021   ALKPHOS 81 05/18/2021   LIPASE 91 03/21/2013     ID Lab Results  Component Value Date   SARSCOV2NAA NEGATIVE 03/27/2020   STAPHAUREUS POSITIVE (A) 03/27/2020   MRSAPCR NEGATIVE 03/27/2020     Bone Lab Results  Component Value Date   25OHVITD1 48 12/26/2018   25OHVITD2 2.7 12/26/2018   25OHVITD3 45 12/26/2018     Endocrine Lab Results  Component Value Date   GLUCOSE  116 (H) 05/18/2021   GLUCOSEU NEGATIVE 03/27/2020   TSH 2.317 09/08/2017     Neuropathy Lab Results  Component Value Date   VITAMINB12 393 12/26/2018     CNS No results found for: COLORCSF, APPEARCSF, RBCCOUNTCSF, WBCCSF, POLYSCSF, LYMPHSCSF, EOSCSF, PROTEINCSF, GLUCCSF, JCVIRUS, CSFOLI, IGGCSF, LABACHR, ACETBL, LABACHR, ACETBL   Inflammation (CRP: Acute  ESR: Chronic) Lab Results  Component Value Date   CRP 1 12/26/2018   ESRSEDRATE 11  12/26/2018     Rheumatology Lab Results  Component Value Date   RF <10 08/29/2013   ANA NEG 08/29/2013     Coagulation Lab Results  Component Value Date   INR 1.0 03/27/2020   LABPROT 12.4 03/27/2020   APTT 30 03/27/2020   PLT 297 05/18/2021     Cardiovascular Lab Results  Component Value Date   CKTOTAL 102 03/21/2013   TROPONINI <0.03 02/01/2015   HGB 13.7 05/18/2021   HCT 41.1 05/18/2021     Screening Lab Results  Component Value Date   SARSCOV2NAA NEGATIVE 03/27/2020   COVIDSOURCE NASOPHARYNGEAL 03/16/2019   STAPHAUREUS POSITIVE (A) 03/27/2020   MRSAPCR NEGATIVE 03/27/2020     Cancer No results found for: CEA, CA125, LABCA2   Allergens No results found for: ALMOND, APPLE, ASPARAGUS, AVOCADO, BANANA, BARLEY, BASIL, BAYLEAF, GREENBEAN, LIMABEAN, WHITEBEAN, BEEFIGE, REDBEET, BLUEBERRY, BROCCOLI, CABBAGE, MELON, CARROT, CASEIN, CASHEWNUT, CAULIFLOWER, CELERY     Note: Lab results reviewed.  [place]  Drug: Abigail [name]  reports current drug use. Frequency: 7.00 times per week. Drug: Marijuana. Alcohol:  reports that she does not currently use alcohol. Tobacco:  reports that she quit smoking about 4 months ago. Her smoking use included cigarettes. She has a 10.50 pack-year smoking history. She has never used smokeless tobacco. Medical:  has a past medical history of Adrenal cortex insufficiency ([place]), Anxiety, Arthritis, Asthma, Chronic kidney disease, Complication of anesthesia, Depression, Family history of adverse reaction to anesthesia, Fibromyalgia, Headache(784.0), Hyperlipidemia, Hypertension, Lumbar radiculopathy, Myofascial pain syndrome, cervical, Occasional tremors, Osteoarthritis, Osteoporosis, PONV (postoperative nausea and vomiting), and Syncope. Family: family history includes Cystic fibrosis in her brother; Heart disease in her father; Hypertension in her father; Osteoporosis in her mother.  Past Surgical History:  Procedure Laterality Date   ABDOMINAL  HYSTERECTOMY  14 yrs ago   ANTERIOR CERVICAL DECOMPRESSION/DISCECTOMY FUSION 4 LEVELS N/A 10/18/2018   Procedure: ANTERIOR CERVICAL DECOMPRESSION/DISCECTOMY FUSION C 2-5;  Surgeon: Meade Maw, MD;  Location: [facility];  Service: Neurosurgery;  Laterality: N/A;   BACK SURGERY     x 3.  Plate in neck   BREAST BIOPSY Left 04/10/2015   BENIGN BREAST TISSUE   CARPAL TUNNEL RELEASE Left 03/22/2018   CARPAL TUNNEL RELEASE Right 02/08/2018   CHOLECYSTECTOMY     EXCISION/RELEASE BURSA HIP Left 05/12/2021   Procedure: Left hip endoscopic trochanteric bursectomy and IT band release;  Surgeon: Leim Fabry, MD;  Location: [facility];  Service: Orthopedics;  Laterality: Left;   EYE SURGERY     lasik   HEMI-MICRODISCECTOMY LUMBAR LAMINECTOMY LEVEL 1 N/A 09/28/2013   Procedure: HEMI-MICRODISCECTOMY LUMBAR LAMINECTOMY L5-S1 LEFT;  Surgeon: Tobi Bastos, MD;  Location: [facility];  Service: Orthopedics;  Laterality: N/A;   KNEE ARTHROSCOPY WITH LATERAL RELEASE Left 01/03/2020   Procedure: KNEE ARTHROSCOPY WITH LATERAL RELEASE FOR PATELLA SUBLUXATION;  Surgeon: Hessie Knows, MD;  Location: [facility];  Service: Orthopedics;  Laterality: Left;   KNEE SURGERY Left    dr. Holley Raring performed procedure. dr. Rudene Christians then did an arthroscopy   left foot  bone spur surgery  10 yrs ago   LUMBAR LAMINECTOMY/DECOMPRESSION MICRODISCECTOMY  07/13/2012   Procedure: LUMBAR LAMINECTOMY/DECOMPRESSION MICRODISCECTOMY 1 LEVEL;  Surgeon: Tobi Bastos, MD;  Location: [facility];  Service: Orthopedics;  Laterality: N/A;  Central Decompression Lumbar Laminectomy L5 - S1 (X-Ray)   LUMBAR LAMINECTOMY/DECOMPRESSION MICRODISCECTOMY Right 03/07/2013   Procedure: LUMBAR DECOMPRESSIVE HEMI LAMINECTOMY AND  MICRODISCECTOMY  L5-S1 RIGHT ;  Surgeon: Tobi Bastos, MD;  Location: [facility];  Service: Orthopedics;  Laterality: Right;   PULSE GENERATOR IMPLANT Left 03/31/2020   Procedure: PLACEMENT OF LEFT FLANK PULSE GENERATOR;  Surgeon: Deetta Perla, MD;  Location: [facility];  Service: Neurosurgery;  Laterality: Left;   SPINAL CORD STIMULATOR INSERTION N/A 11/28/2019   Procedure: LUMBAR SPINAL CORD STIMULATOR INSERTION;  Surgeon: Meade Maw, MD;  Location: [facility];  Service: Neurosurgery;  Laterality: N/A;   SPINAL CORD STIMULATOR INSERTION N/A 03/31/2020   Procedure: INSERTION CERVICAL SPINAL STIMULATOR;  Surgeon: Deetta Perla, MD;  Location: [facility];  Service: Neurosurgery;  Laterality: N/A;   Active Ambulatory Problems    Diagnosis Date Noted   Intervertebral disc disorder with myelopathy, lumbar region 07/13/2012   Spinal stenosis, lumbar 07/13/2012   Spinal stenosis, lumbar region, with neurogenic claudication 03/07/2013   Chronic radicular lumbar pain 03/07/2013   Recurrent herniation of lumbar disc 09/27/2013   Herniated lumbar intervertebral disc 09/28/2013   TIA (transient ischemic attack) 06/02/2014   Hypertension 06/02/2014   Decreased sensation of foot 06/02/2014   Cervical radiculopathy 10/18/2018   Adrenal insufficiency ([place]) 05/25/2018   Depression 02/12/2016   Benign essential hypertension 02/12/2016   Carpal tunnel syndrome, bilateral upper limbs 10/10/2017   Chronic bilateral low back pain without sciatica 09/28/2017   Chronic daily headache 09/14/2017   Enlarged pituitary gland ([place]) 01/11/2018   Headache disorder 10/18/2017   Insomnia 02/12/2016   Lightheadedness 02/08/2018   Mixed incontinence 09/28/2017   Neck pain, chronic 06/19/2018   Nicotine use disorder 09/13/2018   Osteoarthritis of multiple joints 03/11/2016   Pain of left hip joint 12/27/2017   Pure hypercholesterolemia 02/12/2016   Sleeping difficulty 09/14/2017   Snoring 09/14/2017   Weakness 09/22/2017   Chronic pain of right knee 12/26/2018   Chronic pain syndrome 12/26/2018   Long term prescription benzodiazepine use 12/26/2018   Opiate use 12/26/2018   Pharmacologic therapy 12/26/2018   Disorder of skeletal system  12/26/2018   Problems influencing health status 12/26/2018   Marijuana use 01/01/2019   Primary osteoarthritis of right knee 06/11/2019   Postlaminectomy syndrome, lumbar (Bilateral L5/S1 [place]ami) 08/29/2019   Myofascial pain 08/29/2019   Primary osteoarthritis of left knee 08/29/2019   Resolved Ambulatory Problems    Diagnosis Date Noted   No Resolved Ambulatory Problems   Past Medical History:  Diagnosis Date   Adrenal cortex insufficiency ([facility])    Anxiety    Arthritis    Asthma    Chronic kidney disease    Complication of anesthesia    Family history of adverse reaction to anesthesia    Fibromyalgia    Headache(784.0)    Hyperlipidemia    Lumbar radiculopathy    Myofascial pain syndrome, cervical    Occasional tremors    Osteoarthritis    Osteoporosis    PONV (postoperative nausea and vomiting)    Syncope    Constitutional Exam  General appearance: Well nourished, well developed, and well hydrated. In no apparent acute distress Vitals:   09/01/21 1101  BP: 95/84  Pulse: 89  Resp: 16  Temp: (!) 97.2 F (36.2 C)  TempSrc: Temporal  SpO2: 97%  Weight: 165 lb (74.8 kg)  Height: 5' 4"  (1.626 m)   BMI Assessment: Estimated body mass index is 28.32 kg/m as calculated from the following:   Height as of this encounter: 5' 4"  (1.626 m).   Weight as of this encounter: 165 lb (74.8 kg).  BMI interpretation table: BMI level Category Range association with higher incidence of chronic pain  <18 kg/m2 Underweight   18.5-24.9 kg/m2 Ideal body weight   25-29.9 kg/m2 Overweight Increased incidence by 20%  30-34.9 kg/m2 Obese (Class I) Increased incidence by 68%  35-39.9 kg/m2 Severe obesity (Class II) Increased incidence by 136%  >40 kg/m2 Extreme obesity (Class III) Increased incidence by 254%   Patient's current BMI Ideal Body weight  Body mass index is 28.32 kg/m. Ideal body weight: 54.7 kg (120 lb 9.5 oz) Adjusted ideal body weight: 62.8 kg (138 lb 5.7 oz)   BMI  Readings from Last 4 Encounters:  09/01/21 28.32 kg/m  05/18/21 24.13 kg/m  05/08/21 24.13 kg/m  03/31/20 28.12 kg/m   Wt Readings from Last 4 Encounters:  09/01/21 165 lb (74.8 kg)  05/18/21 145 lb (65.8 kg)  05/08/21 145 lb (65.8 kg)  03/31/20 169 lb (76.7 kg)    Psych/Mental status: Alert, oriented x 3 (person, place, & time)       Eyes: PERLA Respiratory: No evidence of acute respiratory distress  Lumbar Spine Area Exam  Skin & Axial Inspection: Well healed scar from previous spine surgery detected Alignment: Symmetrical Functional ROM: Pain restricted ROM affecting primarily the left Stability: No instability detected Muscle Tone/Strength: Functionally intact. No obvious neuro-muscular anomalies detected. Sensory (Neurological): Dermatomal pain pattern left L5  Gait & Posture Assessment  Ambulation: Unassisted Gait: Relatively normal for age and body habitus Posture: WNL  Lower Extremity Exam    Side: Right lower extremity  Side: Left lower extremity  Stability: No instability observed          Stability: No instability observed          Skin & Extremity Inspection: Skin color, temperature, and hair growth are WNL. No peripheral edema or cyanosis. No masses, redness, swelling, asymmetry, or associated skin lesions. No contractures.  Skin & Extremity Inspection: Skin color, temperature, and hair growth are WNL. No peripheral edema or cyanosis. No masses, redness, swelling, asymmetry, or associated skin lesions. No contractures.  Functional ROM: Unrestricted ROM                  Functional ROM: Pain restricted ROM for all joints of the lower extremity Limited SLR (straight leg raise)  Muscle Tone/Strength: Functionally intact. No obvious neuro-muscular anomalies detected.  Muscle Tone/Strength: Functionally intact. No obvious neuro-muscular anomalies detected.  Sensory (Neurological): Unimpaired        Sensory (Neurological): Dermatomal pain pattern left L5         DTR: Patellar: deferred today Achilles: deferred today Plantar: deferred today  DTR: Patellar: deferred today Achilles: deferred today Plantar: deferred today  Palpation: No palpable anomalies  Palpation: No palpable anomalies    Assessment  Primary Diagnosis & Pertinent Problem List: The primary encounter diagnosis was Lumbar foraminal stenosis (left L5). Diagnoses of Postlaminectomy syndrome, lumbar (Bilateral L5/S1 [place]), Lumbar radiculopathy, and Chronic radicular lumbar pain were also pertinent to this visit.  Visit Diagnosis (New problems to examiner): 1. Lumbar foraminal stenosis (left L5)   2. Postlaminectomy syndrome, lumbar (Bilateral L5/S1 Lami)   3. Lumbar radiculopathy  4. Chronic radicular lumbar pain    Plan of Care     Procedure Orders         SELECTIVE NERVE ROOT     Orders Placed This Encounter  Procedures   SELECTIVE NERVE ROOT    Standing Status:   Future    Standing Expiration Date:   10/01/2021    Scheduling Instructions:     Side: LEFT L5     Level: L5     Sedation: PO Valium     Timeframe: ASAA    Order Specific Question:   Where will this procedure be performed?    Answer:   [facility]     Provider-requested follow-up: Return in about 2 weeks (around 09/15/2021) for Left L5 SNRB, minimal sedation (PO Valium). I spent a total of 40 minutes reviewing chart data, face-to-face evaluation with the patient, counseling and coordination of care as detailed above.   No future appointments.  Note by: Gillis Santa, MD Date: 09/01/2021; Time: 11:20 AM

## 2021-09-08 ENCOUNTER — Telehealth: Payer: Self-pay

## 2021-09-08 NOTE — Telephone Encounter (Signed)
I called to schedule her procedure for Monday and she wants a nurse to call her, she said she was not given instructions and she knows nothing about a valium.

## 2021-09-08 NOTE — Telephone Encounter (Signed)
Patient called wanting instructions for procedure and valium for sedation.  Patient was instructed not to eat x 6 hours and clear liquids 3 hours prior to procedure.  She also asked about taking medications.  She does take BP medicine, I told her to go ahead and take that and wait for the rest.  She is not on any blood thinners.  Patient verbalizes u/o information. She does know to please have a driver.

## 2021-09-14 ENCOUNTER — Encounter: Payer: Self-pay | Admitting: Student in an Organized Health Care Education/Training Program

## 2021-09-14 ENCOUNTER — Ambulatory Visit (HOSPITAL_BASED_OUTPATIENT_CLINIC_OR_DEPARTMENT_OTHER): Payer: Medicare Other | Admitting: Student in an Organized Health Care Education/Training Program

## 2021-09-14 ENCOUNTER — Ambulatory Visit
Admission: RE | Admit: 2021-09-14 | Discharge: 2021-09-14 | Disposition: A | Discharge status: Home / Self Care | Payer: Medicare Other | Source: Ambulatory Visit | Attending: Student in an Organized Health Care Education/Training Program | Admitting: Student in an Organized Health Care Education/Training Program

## 2021-09-14 ENCOUNTER — Other Ambulatory Visit: Payer: Self-pay

## 2021-09-14 DIAGNOSIS — M5416 Radiculopathy, lumbar region: Secondary | ICD-10-CM

## 2021-09-14 DIAGNOSIS — G8929 Other chronic pain: Secondary | ICD-10-CM | POA: Diagnosis present

## 2021-09-14 DIAGNOSIS — M48061 Spinal stenosis, lumbar region without neurogenic claudication: Secondary | ICD-10-CM | POA: Diagnosis present

## 2021-09-14 MED ORDER — SODIUM CHLORIDE 0.9% FLUSH
1.0000 mL | Freq: Once | INTRAVENOUS | Status: AC
  Administered 2021-09-14: 10 mL

## 2021-09-14 MED ORDER — SODIUM CHLORIDE (PF) 0.9 % IJ SOLN
INTRAMUSCULAR | Status: AC
  Filled 2021-09-14: qty 10

## 2021-09-14 MED ORDER — IOHEXOL 180 MG/ML  SOLN
10.0000 mL | Freq: Once | INTRAMUSCULAR | Status: AC
  Administered 2021-09-14: 10 mL via EPIDURAL

## 2021-09-14 MED ORDER — ROPIVACAINE HCL 2 MG/ML IJ SOLN
INTRAMUSCULAR | Status: AC
  Filled 2021-09-14: qty 20

## 2021-09-14 MED ORDER — DEXAMETHASONE SODIUM PHOSPHATE 10 MG/ML IJ SOLN
10.0000 mg | Freq: Once | INTRAMUSCULAR | Status: AC
  Administered 2021-09-14: 10 mg
  Filled 2021-09-14: qty 1

## 2021-09-14 MED ORDER — DIAZEPAM 5 MG PO TABS
ORAL_TABLET | ORAL | Status: AC
  Filled 2021-09-14: qty 1

## 2021-09-14 MED ORDER — ROPIVACAINE HCL 2 MG/ML IJ SOLN
1.0000 mL | Freq: Once | INTRAMUSCULAR | Status: AC
  Administered 2021-09-14: 20 mL via EPIDURAL

## 2021-09-14 MED ORDER — LIDOCAINE HCL 2 % IJ SOLN
20.0000 mL | Freq: Once | INTRAMUSCULAR | Status: AC
  Administered 2021-09-14: 400 mg
  Filled 2021-09-14: qty 20

## 2021-09-14 MED ORDER — DIAZEPAM 5 MG PO TABS
5.0000 mg | ORAL_TABLET | ORAL | Status: AC
  Administered 2021-09-14: 5 mg via ORAL

## 2021-09-14 NOTE — Progress Notes (Signed)
Safety precautions to be maintained throughout the outpatient stay will include: orient to surroundings, keep bed in low position, maintain call bell within reach at all times, provide assistance with transfer out of bed and ambulation.  

## 2021-09-14 NOTE — Progress Notes (Signed)
PROVIDER NOTE: Information contained herein reflects review and annotations entered in association with encounter. Interpretation of such information and data should be left to medically-trained personnel. Information provided to patient can be located elsewhere in the medical record under "Patient Instructions". Document created using STT-dictation technology, any transcriptional errors that may result from process are unintentional.    Patient: Abigail [name]  Service Category: Procedure Provider: Gillis Santa, MD DOB: 01-25-1966 DOS: 09/14/2021 Location: [facility] MRN: [id number] Setting: Ambulatory - outpatient Referring Provider: Gillis Santa, MD Type: Established Patient Specialty: Interventional Pain Management PCP: Derinda Late, MD  Primary Reason for Visit: Interventional Pain Management Treatment. CC: Back Pain (lower)    Procedure:          Anesthesia, Analgesia, Anxiolysis:  Type: Diagnostic Selective Nerve Root Block  #1  Region: Posterolateral Lumbosacral Level: L5 Level(s) Laterality: Left       Anesthesia: Local (1-2% Lidocaine)  Anxiolysis: Oral  Sedation: Minimal  Guidance: Fluoroscopy           Position: Prone   Indications: 1. Lumbar foraminal stenosis (left L5)   2. Lumbar radiculopathy   3. Chronic radicular lumbar pain    Pain Score: Pre-procedure: 7 /10 Post-procedure: 6 /10     Pre-op H&P Assessment:  Ms. Arca is a 55 y.o. (year old), female patient, seen today for interventional treatment. She  has a past surgical history that includes left foot bone spur surgery (10 yrs ago); Abdominal hysterectomy (14 yrs ago); Lumbar laminectomy/decompression microdiscectomy (07/13/2012); Lumbar laminectomy/decompression microdiscectomy (Right, 03/07/2013); Hemi-microdiscectomy lumbar laminectomy level 1 (N/A, 09/28/2013); Cholecystectomy; Anterior cervical decompression/discectomy fusion 4 level (N/A, 10/18/2018); Knee surgery (Left); Spinal  cord stimulator insertion (N/A, 11/28/2019); Breast biopsy (Left, 04/10/2015); Back surgery; Knee arthroscopy with lateral release (Left, 01/03/2020); Eye surgery; Spinal cord stimulator insertion (N/A, 03/31/2020); Pulse generator implant (Left, 03/31/2020); Carpal tunnel release (Left, 03/22/2018); Carpal tunnel release (Right, 02/08/2018); and Excision/release bursa hip (Left, 05/12/2021). Ms. Hritz has a current medication list which includes the following prescription(s): acetaminophen, amlodipine, atorvastatin, biotin, celecoxib, cholecalciferol, clonazepam, duloxetine, meclizine, nortriptyline, olmesartan, ondansetron, propranolol, topiramate, hydrocodone-acetaminophen, and prednisone. Her primarily concern today is the Back Pain (lower)  Initial Vital Signs:  Pulse/HCG Rate: 85ECG Heart Rate: 79 Temp:  (!) 97.5 F (36.4 C) Resp: 16 BP: 135/83 SpO2: 93 %  BMI: Estimated body mass index is 25.79 kg/m as calculated from the following:   Height as of this encounter: 5\' 5"  (1.651 m).   Weight as of this encounter: 155 lb (70.3 kg).  Risk Assessment: Allergies: Reviewed. She has No Known Allergies.  Allergy Precautions: None required Coagulopathies: Reviewed. None identified.  Blood-thinner therapy: None at this time Active Infection(s): Reviewed. None identified. Ms. Laton is afebrile  Site Confirmation: Ms. Noyes was asked to confirm the procedure and laterality before marking the site Procedure checklist: Completed Consent: Before the procedure and under the influence of no sedative(s), amnesic(s), or anxiolytics, the patient was informed of the treatment options, risks and possible complications. To fulfill our ethical and legal obligations, as recommended by the American Medical Association's Code of Ethics, I have informed the patient of my clinical impression; the nature and purpose of the treatment or procedure; the risks, benefits, and possible complications of the  intervention; the alternatives, including doing nothing; the risk(s) and benefit(s) of the alternative treatment(s) or procedure(s); and the risk(s) and benefit(s) of doing nothing. The patient was provided information about the general risks and possible complications associated with the procedure. These may  include, but are not limited to: failure to achieve desired goals, infection, bleeding, organ or nerve damage, allergic reactions, paralysis, and death. In addition, the patient was informed of those risks and complications associated to Spine-related procedures, such as failure to decrease pain; infection (i.e.: Meningitis, epidural or intraspinal abscess); bleeding (i.e.: epidural hematoma, subarachnoid hemorrhage, or any other type of intraspinal or peri-dural bleeding); organ or nerve damage (i.e.: Any type of peripheral nerve, nerve root, or spinal cord injury) with subsequent damage to sensory, motor, and/or autonomic systems, resulting in permanent pain, numbness, and/or weakness of one or several areas of the body; allergic reactions; (i.e.: anaphylactic reaction); and/or death. Furthermore, the patient was informed of those risks and complications associated with the medications. These include, but are not limited to: allergic reactions (i.e.: anaphylactic or anaphylactoid reaction(s)); adrenal axis suppression; blood sugar elevation that in diabetics may result in ketoacidosis or comma; water retention that in patients with history of congestive heart failure may result in shortness of breath, pulmonary edema, and decompensation with resultant heart failure; weight gain; swelling or edema; medication-induced neural toxicity; particulate matter embolism and blood vessel occlusion with resultant organ, and/or nervous system infarction; and/or aseptic necrosis of one or more joints. Finally, the patient was informed that Medicine is not an exact science; therefore, there is also the possibility of  unforeseen or unpredictable risks and/or possible complications that may result in a catastrophic outcome. The patient indicated having understood very clearly. We have given the patient no guarantees and we have made no promises. Enough time was given to the patient to ask questions, all of which were answered to the patient's satisfaction. Ms. Maurin has indicated that she wanted to continue with the procedure. Attestation: I, the ordering provider, attest that I have discussed with the patient the benefits, risks, side-effects, alternatives, likelihood of achieving goals, and potential problems during recovery for the procedure that I have provided informed consent. Date  Time: 09/14/2021 10:38 AM  Pre-Procedure Preparation:  Monitoring: As per clinic protocol. Respiration, ETCO2, SpO2, BP, heart rate and rhythm monitor placed and checked for adequate function Safety Precautions: Patient was assessed for positional comfort and pressure points before starting the procedure. Time-out: I initiated and conducted the "Time-out" before starting the procedure, as per protocol. The patient was asked to participate by confirming the accuracy of the "Time Out" information. Verification of the correct person, site, and procedure were performed and confirmed by me, the nursing staff, and the patient. "Time-out" conducted as per Joint Commission's Universal Protocol (UP.01.01.01). Time: 1135  Description of Procedure:          Target Area: The intersection of a line running parallel to the transverse plane over the lower border of the transverse process, and a line parallel to the sagittal plane and running over the lateral edge of the vertebral body's posterior lamina. This point is located next to the inferior and lateral edge of the pedicle's base.  Approach: Paravertebral approach with a slight lateral anteroposterior orientation. Area Prepped: Entire Posterior Lumbosacral Area DuraPrep (Iodine Povacrylex  [0.7% available iodine] and Isopropyl Alcohol, 74% w/w) Safety Precautions: Aspiration looking for blood return was conducted prior to all injections. At no point did we inject any substances, as a needle was being advanced. No attempts were made at seeking any paresthesias. Safe injection practices and needle disposal techniques used. Medications properly checked for expiration dates. SDV (single dose vial) medications used. Description of the Procedure: Protocol guidelines were followed. The patient was placed  in position over the procedure table. The target area was identified and the area prepped in the usual manner. Skin & deeper tissues infiltrated with local anesthetic. Appropriate amount of time allowed to pass for local anesthetics to take effect. The procedure needles were then advanced to the target area. The needle is inserted in such a way as to contact the posterior vertebral lamina. It is then walked over to the edge up to the point when it clears it. At this point is is advanced under live fluoroscopy on the lateral projection, making sure to stop as soon as the tip clears the anterior border of the lamina. Proper needle placement secured. Negative aspiration confirmed. Solution injected in intermittent fashion, asking for sharp paresthesias, or systemic symptoms every 0.1cc of injectate, until a total of 0.5 mL is injected. The needles were then removed and the area cleansed, making sure to leave some of the prepping solution back to take advantage of its long term bactericidal properties.  Vitals:   09/14/21 1052 09/14/21 1136 09/14/21 1139  BP: 135/83 (!) 147/90 138/87  Pulse: 85    Resp: 16 17 14   Temp: (!) 97.5 F (36.4 C)    TempSrc: Temporal    SpO2: 93% 97% 98%  Weight: 155 lb (70.3 kg)    Height: 5\' 5"  (1.651 m)      Start Time: 1135 hrs. End Time: 1139 hrs.  Materials:  Needle(s) Type: Spinal Needle Gauge: 22G Length: 3.5-in Medication(s): Please see orders for  medications and dosing details. 3 cc solution made of 1cc of preservative-free saline, 1cc of 0.2% ropivacaine, 1 cc of Decadron 10 mg/cc.  Imaging Guidance (Spinal):          Type of Imaging Technique: Fluoroscopy Guidance (Spinal) Indication(s): Assistance in needle guidance and placement for procedures requiring needle placement in or near specific anatomical locations not easily accessible without such assistance. Exposure Time: Please see nurses notes. Contrast: Before injecting any contrast, we confirmed that the patient did not have an allergy to iodine, shellfish, or radiological contrast. Once satisfactory needle placement was completed at the desired level, radiological contrast was injected. Contrast injected under live fluoroscopy. No contrast complications. See chart for type and volume of contrast used. Fluoroscopic Guidance: I was personally present during the use of fluoroscopy. "Tunnel Vision Technique" used to obtain the best possible view of the target area. Parallax error corrected before commencing the procedure. "Direction-depth-direction" technique used to introduce the needle under continuous pulsed fluoroscopy. Once target was reached, antero-posterior, oblique, and lateral fluoroscopic projection used confirm needle placement in all planes. Images permanently stored in EMR. Interpretation: I personally interpreted the imaging intraoperatively. Adequate needle placement confirmed in multiple planes. Appropriate spread of contrast into desired area was observed. No evidence of afferent or efferent intravascular uptake. No intrathecal or subarachnoid spread observed. Permanent images saved into the patient's record.    Post-operative Assessment:  Post-procedure Vital Signs:  Pulse/HCG Rate: 8578 Temp:  (!) 97.5 F (36.4 C) Resp: 14 BP: 138/87 SpO2: 98 %  EBL: None  Complications: No immediate post-treatment complications observed by team, or reported by  patient.  Note: The patient tolerated the entire procedure well. A repeat set of vitals were taken after the procedure and the patient was kept under observation following institutional policy, for this type of procedure. Post-procedural neurological assessment was performed, showing return to baseline, prior to discharge. The patient was provided with post-procedure discharge instructions, including a section on how to identify potential problems. Should  any problems arise concerning this procedure, the patient was given instructions to immediately contact us, at any time, without hesitation. In any case, we plan to contact the patient by telephone for a follow-up status report regarding this interventional procedure.  Comments:  No additional relevant information.  5 out of 5 strength bilateral lower extremity: Plantar flexion, dorsiflexion, knee flexion, knee extension.   Plan of Care  Orders:  Orders Placed This Encounter  Procedures   DG [facility] C-ARM 1-60 MIN NO REPORT    Intraoperative interpretation by procedural physician at [place].    Standing Status:   Standing    Number of Occurrences:   1    Order Specific Question:   Reason for exam:    Answer:   Assistance in needle guidance and placement for procedures requiring needle placement in or near specific anatomical locations not easily accessible without such assistance.    Medications ordered for procedure: Meds ordered this encounter  Medications   iohexol (OMNIPAQUE) 180 MG/ML injection 10 mL    Must be Myelogram-compatible. If not available, you may substitute with a water-soluble, non-ionic, hypoallergenic, myelogram-compatible radiological contrast medium.   lidocaine (XYLOCAINE) 2 % (with pres) injection 400 mg   diazepam (VALIUM) tablet 5 mg    Make sure Flumazenil is available in the pyxis when using this medication. If oversedation occurs, administer 0.2 mg IV over 15 sec. If after 45 sec no  response, administer 0.2 mg again over 1 min; may repeat at 1 min intervals; not to exceed 4 doses (1 mg)   dexamethasone (DECADRON) injection 10 mg   ropivacaine (PF) 2 mg/mL (0.2%) (NAROPIN) injection 1 mL   sodium chloride flush (NS) 0.9 % injection 1 mL    Medications administered: We administered iohexol, lidocaine, diazepam, dexamethasone, ropivacaine (PF) 2 mg/mL (0.2%), and sodium chloride flush.  See the medical record for exact dosing, route, and time of administration.  Follow-up plan:   Return in about 29 days (around 10/13/2021) for Post Procedure Evaluation, virtual.     Recent Visits Date Type Provider Dept  09/01/21 Office Visit Gillis Santa, MD [facility]  Showing recent visits within past 90 days and meeting all other requirements Today's Visits Date Type Provider Dept  09/14/21 Procedure visit Gillis Santa, MD [facility]  Showing today's visits and meeting all other requirements Future Appointments No visits were found meeting these conditions. Showing future appointments within next 90 days and meeting all other requirements Disposition: Discharge home  Discharge (Date  Time): 09/14/2021; 1145 hrs.   Primary Care Physician: Derinda Late, MD Location: [facility] Note by: Gillis Santa, MD Date: 09/14/2021; Time: 11:46 AM  Disclaimer:  Medicine is not an exact science. The only guarantee in medicine is that nothing is guaranteed. It is important to note that the decision to proceed with this intervention was based on the information collected from the patient. The Data and conclusions were drawn from the patient's questionnaire, the interview, and the physical examination. Because the information was provided in large part by the patient, it cannot be guaranteed that it has not been purposely or unconsciously manipulated. Every effort has been made to obtain as much relevant data as possible for this evaluation.  It is important to note that the conclusions that lead to this procedure are derived in large part from the available data. Always take into account that the treatment will also be dependent on availability of resources and existing treatment  guidelines, considered by other Pain Management Practitioners as being common knowledge and practice, at the time of the intervention. For Medico-Legal purposes, it is also important to point out that variation in procedural techniques and pharmacological choices are the acceptable norm. The indications, contraindications, technique, and results of the above procedure should only be interpreted and judged by a Board-Certified Interventional Pain Specialist with extensive familiarity and expertise in the same exact procedure and technique.

## 2021-09-14 NOTE — Patient Instructions (Signed)

## 2021-09-15 ENCOUNTER — Telehealth: Payer: Self-pay

## 2021-09-15 NOTE — Telephone Encounter (Signed)
Called PP , Denies any needs at this time. Instructed to call if needed.

## 2021-10-13 ENCOUNTER — Encounter: Payer: Self-pay | Admitting: Student in an Organized Health Care Education/Training Program

## 2021-10-13 NOTE — Telephone Encounter (Signed)
Thank you so much. I printed this out and gave to the unit clerk to change in computer

## 2021-10-14 ENCOUNTER — Other Ambulatory Visit: Payer: Self-pay

## 2021-10-14 ENCOUNTER — Ambulatory Visit
Payer: Medicare HMO | Attending: Student in an Organized Health Care Education/Training Program | Admitting: Student in an Organized Health Care Education/Training Program

## 2021-10-14 DIAGNOSIS — M961 Postlaminectomy syndrome, not elsewhere classified: Secondary | ICD-10-CM | POA: Diagnosis not present

## 2021-10-14 DIAGNOSIS — M48061 Spinal stenosis, lumbar region without neurogenic claudication: Secondary | ICD-10-CM

## 2021-10-14 DIAGNOSIS — G8929 Other chronic pain: Secondary | ICD-10-CM

## 2021-10-14 DIAGNOSIS — M5416 Radiculopathy, lumbar region: Secondary | ICD-10-CM | POA: Diagnosis not present

## 2021-10-14 NOTE — Progress Notes (Signed)
Patient: Abigail [name]  Service Category: E/M  Provider: Gillis Santa, MD  DOB: 12-May-1966  DOS: 10/14/2021  Location: Office  MRN: [id number]  Setting: Ambulatory outpatient  Referring Provider: Derinda Late, MD  Type: Established Patient  Specialty: Interventional Pain Management  PCP: Derinda Late, MD  Location: Home  Delivery: TeleHealth     Virtual Encounter - Pain Management PROVIDER NOTE: Information contained herein reflects review and annotations entered in association with encounter. Interpretation of such information and data should be left to medically-trained personnel. Information provided to patient can be located elsewhere in the medical record under "Patient Instructions". Document created using STT-dictation technology, any transcriptional errors that may result from process are unintentional.    Contact & Pharmacy Preferred: [phone] Home: [phone] (home) Mobile: [phone] (mobile) E-mail: [email or web address]  [id number]Phone: [phone]: [phone]  Pre-screening  Ms. HBariloffered "in-person" vs "virtual" encounter. She indicated preferring virtual for this encounter.   Reason COVID-19*   Social distancing based on CDC and AMA recommendations.   I contacted WJoseph Pierinion 10/14/2021 via telephone.      I clearly identified myself as BGillis Santa MD. I verified that I was speaking with the correct person using two identifiers (Name: WLYNDALL BELLOT and date of birth: 930-Jun-1967.  Consent I sought verbal advanced consent from WJoseph Pierinifor virtual visit interactions. I informed Ms. HDsouzaof possible security and privacy concerns, risks, and limitations associated with providing "not-in-person" medical evaluation and management services. I also informed Ms. HBergdollof the availability of "in-person" appointments. Finally, I informed her  that there would be a charge for the virtual visit and that she could be  personally, fully or partially, financially responsible for it. Ms. HMarxenexpressed understanding and agreed to proceed.   Historic Elements   Ms. WCORLENE SABIAis a 56y.o. year old, female patient evaluated today after our last contact on 09/14/2021. Ms. HRound has a past medical history of Adrenal cortex insufficiency ([place], Anxiety, Arthritis, Asthma, Chronic kidney disease, Complication of anesthesia, Depression, Family history of adverse reaction to anesthesia, Fibromyalgia, Headache(784.0), Hyperlipidemia, Hypertension, Lumbar radiculopathy, Myofascial pain syndrome, cervical, Occasional tremors, Osteoarthritis, Osteoporosis, PONV (postoperative nausea and vomiting), and Syncope. She also  has a past surgical history that includes left foot bone spur surgery (10 yrs ago); Abdominal hysterectomy (14 yrs ago); Lumbar laminectomy/decompression microdiscectomy (07/13/2012); Lumbar laminectomy/decompression microdiscectomy (Right, 03/07/2013); Hemi-microdiscectomy lumbar laminectomy level 1 (N/A, 09/28/2013); Cholecystectomy; Anterior cervical decompression/discectomy fusion 4 level (N/A, 10/18/2018); Knee surgery (Left); Spinal cord stimulator insertion (N/A, 11/28/2019); Breast biopsy (Left, 04/10/2015); Back surgery; Knee arthroscopy with lateral release (Left, 01/03/2020); Eye surgery; Spinal cord stimulator insertion (N/A, 03/31/2020); Pulse generator implant (Left, 03/31/2020); Carpal tunnel release (Left, 03/22/2018); Carpal tunnel release (Right, 02/08/2018); and Excision/release bursa hip (Left, 05/12/2021). Ms. HBryanthas a current medication list which includes the following prescription(s): acetaminophen, amlodipine, atorvastatin, biotin, celecoxib, cholecalciferol, clonazepam, duloxetine, meclizine, nortriptyline, olmesartan, ondansetron, propranolol, topiramate, hydrocodone-acetaminophen, and prednisone. She  reports  that she quit smoking about 5 months ago. Her smoking use included cigarettes. She has a 10.50 pack-year smoking history. She has never used smokeless tobacco. She reports that she does not currently use alcohol. She reports current drug use. Frequency: 7.00 times per week. Drug: Marijuana. Ms. HLinarezhas No Known Allergies.   HPI  Today, she is being contacted for a post-procedure assessment.   Post-procedure evaluation  Procedure:          Anesthesia, Analgesia, Anxiolysis:  Type: Diagnostic Selective Nerve Root Block  #1  Region: Posterolateral Lumbosacral Level: L5 Level(s) Laterality: Left       Anesthesia: Local (1-2% Lidocaine)  Anxiolysis: Oral  Sedation: Minimal  Guidance: Fluoroscopy           Position: Prone   Indications: 1. Lumbar foraminal stenosis (left L5)   2. Lumbar radiculopathy   3. Chronic radicular lumbar pain    Pain Score: Pre-procedure: 7 /10 Post-procedure: 6 /10      Effectiveness:  Initial hour after procedure: 100 %  Subsequent 4-6 hours post-procedure: 50 % (2 hours)  Analgesia past initial 6 hours: 0 %  Ongoing improvement:  Analgesic:  0% Function: No improvement ROM: No improvement   Laboratory Chemistry Profile   Renal Lab Results  Component Value Date   BUN 16 05/18/2021   CREATININE 1.05 (H) 05/18/2021   BCR 14 12/26/2018   GFRAA >60 03/27/2020   GFRNONAA >60 05/18/2021    Hepatic Lab Results  Component Value Date   AST 25 05/18/2021   ALT 51 (H) 05/18/2021   ALBUMIN 3.7 05/18/2021   ALKPHOS 81 05/18/2021   LIPASE 91 03/21/2013    Electrolytes Lab Results  Component Value Date   NA 137 05/18/2021   K 4.1 05/18/2021   CL 106 05/18/2021   CALCIUM 9.3 05/18/2021   MG 2.1 12/26/2018    Bone Lab Results  Component Value Date   25OHVITD1 48 12/26/2018   25OHVITD2 2.7 12/26/2018   25OHVITD3 45 12/26/2018    Inflammation (CRP: Acute Phase) (ESR: Chronic Phase) Lab Results  Component Value Date   CRP 1  12/26/2018   ESRSEDRATE 11 12/26/2018         Note: Above Lab results reviewed.  Assessment  The primary encounter diagnosis was Lumbar foraminal stenosis (left L5). Diagnoses of Lumbar radiculopathy, Chronic radicular lumbar pain, and Postlaminectomy syndrome, lumbar (Bilateral L5/S1 L[place]) were also pertinent to this visit.  Plan of Care    Unfortunately, no benefit with left L5 selective nerve root block. Patient has a spinal cord stimulator in place which I encouraged her to continue. Continue medication management with PCP.  Patient not a candidate for chronic opioid therapy given regular THC use.  Follow-up plan:   No follow-ups on file.     s/p L GN RFA on 7/20-helpful and TPI 7/20; status post left hip injection in greater trochanteric bursa injection with orthopedics [facility]) not helpful.  History of L5-S1 laminectomies, status post caudal ESI as well as cervical TPI on 05/28/2019.  PRN left knee genicular radiofrequency ablation.  Repeat on 07/18/2019. Right Genicular RFA on 08/13/2019. MEDTRONIC SCS trial 10/29/19 successful, status post Medtronic implant with Dr. Lacinda Axon.         Recent Visits Date Type Provider Dept  09/14/21 Procedure visit Gillis Santa, MD [facility]  09/01/21 Office Visit Gillis Santa, MD [facility]  Showing recent visits within past 90 days and meeting all other requirements Today's Visits Date Type Provider Dept  10/14/21 Office Visit Gillis Santa, MD [facility]  Showing today's visits and meeting all other requirements Future Appointments No visits were found meeting these conditions. Showing future appointments within next 90 days and meeting all other requirements  I discussed the assessment and treatment plan with the patient. The patient was provided an opportunity to ask questions and all were answered. The patient agreed with the  plan and demonstrated an understanding of the instructions.  Patient  advised to call back or seek an in-person evaluation if the symptoms or condition worsens.  Duration of encounter: 32mnutes.  Note by: BGillis Santa MD Date: 10/14/2021; Time: 1:32 PM

## 2021-11-04 DIAGNOSIS — R42 Dizziness and giddiness: Secondary | ICD-10-CM | POA: Diagnosis not present

## 2021-11-04 DIAGNOSIS — M79605 Pain in left leg: Secondary | ICD-10-CM | POA: Diagnosis not present

## 2021-11-04 DIAGNOSIS — R519 Headache, unspecified: Secondary | ICD-10-CM | POA: Diagnosis not present

## 2021-11-04 DIAGNOSIS — R531 Weakness: Secondary | ICD-10-CM | POA: Diagnosis not present

## 2021-11-04 DIAGNOSIS — H538 Other visual disturbances: Secondary | ICD-10-CM | POA: Diagnosis not present

## 2021-11-04 DIAGNOSIS — R251 Tremor, unspecified: Secondary | ICD-10-CM | POA: Diagnosis not present

## 2021-11-18 DIAGNOSIS — E2740 Unspecified adrenocortical insufficiency: Secondary | ICD-10-CM | POA: Diagnosis not present

## 2021-11-18 DIAGNOSIS — E0789 Other specified disorders of thyroid: Secondary | ICD-10-CM | POA: Diagnosis not present

## 2021-11-30 ENCOUNTER — Encounter: Payer: Self-pay | Admitting: Student in an Organized Health Care Education/Training Program

## 2021-11-30 DIAGNOSIS — R682 Dry mouth, unspecified: Secondary | ICD-10-CM | POA: Diagnosis not present

## 2021-11-30 DIAGNOSIS — M797 Fibromyalgia: Secondary | ICD-10-CM | POA: Diagnosis not present

## 2021-11-30 DIAGNOSIS — H04123 Dry eye syndrome of bilateral lacrimal glands: Secondary | ICD-10-CM | POA: Diagnosis not present

## 2021-11-30 DIAGNOSIS — M2550 Pain in unspecified joint: Secondary | ICD-10-CM | POA: Diagnosis not present

## 2021-12-02 DIAGNOSIS — H04123 Dry eye syndrome of bilateral lacrimal glands: Secondary | ICD-10-CM | POA: Diagnosis not present

## 2021-12-02 DIAGNOSIS — R682 Dry mouth, unspecified: Secondary | ICD-10-CM | POA: Diagnosis not present

## 2021-12-02 DIAGNOSIS — E2740 Unspecified adrenocortical insufficiency: Secondary | ICD-10-CM | POA: Diagnosis not present

## 2021-12-02 DIAGNOSIS — E0789 Other specified disorders of thyroid: Secondary | ICD-10-CM | POA: Diagnosis not present

## 2021-12-02 DIAGNOSIS — M797 Fibromyalgia: Secondary | ICD-10-CM | POA: Diagnosis not present

## 2022-03-02 DIAGNOSIS — E7800 Pure hypercholesterolemia, unspecified: Secondary | ICD-10-CM | POA: Diagnosis not present

## 2022-03-02 DIAGNOSIS — Z79899 Other long term (current) drug therapy: Secondary | ICD-10-CM | POA: Diagnosis not present

## 2022-03-02 DIAGNOSIS — N1831 Chronic kidney disease, stage 3a: Secondary | ICD-10-CM | POA: Diagnosis not present

## 2022-03-09 DIAGNOSIS — E785 Hyperlipidemia, unspecified: Secondary | ICD-10-CM | POA: Diagnosis not present

## 2022-03-09 DIAGNOSIS — E2740 Unspecified adrenocortical insufficiency: Secondary | ICD-10-CM | POA: Diagnosis not present

## 2022-03-09 DIAGNOSIS — Z79899 Other long term (current) drug therapy: Secondary | ICD-10-CM | POA: Diagnosis not present

## 2022-03-09 DIAGNOSIS — I129 Hypertensive chronic kidney disease with stage 1 through stage 4 chronic kidney disease, or unspecified chronic kidney disease: Secondary | ICD-10-CM | POA: Diagnosis not present

## 2022-03-09 DIAGNOSIS — N189 Chronic kidney disease, unspecified: Secondary | ICD-10-CM | POA: Diagnosis not present

## 2022-03-10 DIAGNOSIS — R42 Dizziness and giddiness: Secondary | ICD-10-CM | POA: Diagnosis not present

## 2022-03-10 DIAGNOSIS — R531 Weakness: Secondary | ICD-10-CM | POA: Diagnosis not present

## 2022-03-10 DIAGNOSIS — R519 Headache, unspecified: Secondary | ICD-10-CM | POA: Diagnosis not present

## 2022-03-10 DIAGNOSIS — H538 Other visual disturbances: Secondary | ICD-10-CM | POA: Diagnosis not present

## 2022-03-10 DIAGNOSIS — R2689 Other abnormalities of gait and mobility: Secondary | ICD-10-CM | POA: Diagnosis not present

## 2022-03-10 DIAGNOSIS — R251 Tremor, unspecified: Secondary | ICD-10-CM | POA: Diagnosis not present

## 2022-03-10 DIAGNOSIS — G629 Polyneuropathy, unspecified: Secondary | ICD-10-CM | POA: Diagnosis not present

## 2022-04-21 DIAGNOSIS — E2740 Unspecified adrenocortical insufficiency: Secondary | ICD-10-CM | POA: Diagnosis not present

## 2022-04-21 DIAGNOSIS — E0789 Other specified disorders of thyroid: Secondary | ICD-10-CM | POA: Diagnosis not present

## 2022-05-06 DIAGNOSIS — E0789 Other specified disorders of thyroid: Secondary | ICD-10-CM | POA: Diagnosis not present

## 2022-05-06 DIAGNOSIS — E2740 Unspecified adrenocortical insufficiency: Secondary | ICD-10-CM | POA: Diagnosis not present

## 2022-06-28 ENCOUNTER — Other Ambulatory Visit: Payer: Self-pay | Admitting: Family Medicine

## 2022-06-28 DIAGNOSIS — Z1231 Encounter for screening mammogram for malignant neoplasm of breast: Secondary | ICD-10-CM

## 2022-07-01 ENCOUNTER — Ambulatory Visit
Admission: RE | Admit: 2022-07-01 | Discharge: 2022-07-01 | Disposition: A | Discharge status: Home / Self Care | Payer: Medicare HMO | Source: Ambulatory Visit | Attending: Family Medicine | Admitting: Family Medicine

## 2022-07-01 DIAGNOSIS — Z1231 Encounter for screening mammogram for malignant neoplasm of breast: Secondary | ICD-10-CM | POA: Diagnosis not present

## 2022-07-08 DIAGNOSIS — H538 Other visual disturbances: Secondary | ICD-10-CM | POA: Diagnosis not present

## 2022-07-08 DIAGNOSIS — R200 Anesthesia of skin: Secondary | ICD-10-CM | POA: Diagnosis not present

## 2022-07-08 DIAGNOSIS — R519 Headache, unspecified: Secondary | ICD-10-CM | POA: Diagnosis not present

## 2022-07-08 DIAGNOSIS — R42 Dizziness and giddiness: Secondary | ICD-10-CM | POA: Diagnosis not present

## 2022-07-08 DIAGNOSIS — G629 Polyneuropathy, unspecified: Secondary | ICD-10-CM | POA: Diagnosis not present

## 2022-07-08 DIAGNOSIS — R251 Tremor, unspecified: Secondary | ICD-10-CM | POA: Diagnosis not present

## 2022-07-08 DIAGNOSIS — R2689 Other abnormalities of gait and mobility: Secondary | ICD-10-CM | POA: Diagnosis not present

## 2022-07-08 DIAGNOSIS — H532 Diplopia: Secondary | ICD-10-CM | POA: Diagnosis not present

## 2022-07-08 DIAGNOSIS — R531 Weakness: Secondary | ICD-10-CM | POA: Diagnosis not present

## 2022-07-09 ENCOUNTER — Emergency Department
Admission: EM | Admit: 2022-07-09 | Discharge: 2022-07-09 | Disposition: A | Discharge status: Home / Self Care | Payer: Medicare HMO | Attending: Student in an Organized Health Care Education/Training Program | Admitting: Student in an Organized Health Care Education/Training Program

## 2022-07-09 ENCOUNTER — Other Ambulatory Visit: Payer: Self-pay

## 2022-07-09 ENCOUNTER — Emergency Department: Payer: Medicare HMO

## 2022-07-09 DIAGNOSIS — R630 Anorexia: Secondary | ICD-10-CM | POA: Diagnosis not present

## 2022-07-09 DIAGNOSIS — R531 Weakness: Secondary | ICD-10-CM | POA: Insufficient documentation

## 2022-07-09 DIAGNOSIS — E860 Dehydration: Secondary | ICD-10-CM | POA: Insufficient documentation

## 2022-07-09 DIAGNOSIS — J45909 Unspecified asthma, uncomplicated: Secondary | ICD-10-CM | POA: Diagnosis not present

## 2022-07-09 DIAGNOSIS — R5383 Other fatigue: Secondary | ICD-10-CM | POA: Insufficient documentation

## 2022-07-09 DIAGNOSIS — N189 Chronic kidney disease, unspecified: Secondary | ICD-10-CM | POA: Diagnosis not present

## 2022-07-09 DIAGNOSIS — Z20822 Contact with and (suspected) exposure to covid-19: Secondary | ICD-10-CM | POA: Diagnosis not present

## 2022-07-09 DIAGNOSIS — I129 Hypertensive chronic kidney disease with stage 1 through stage 4 chronic kidney disease, or unspecified chronic kidney disease: Secondary | ICD-10-CM | POA: Diagnosis not present

## 2022-07-09 LAB — CBC
HCT: 44.9 % (ref 36.0–46.0)
Hemoglobin: 14.8 g/dL (ref 12.0–15.0)
MCH: 29.8 pg (ref 26.0–34.0)
MCHC: 33 g/dL (ref 30.0–36.0)
MCV: 90.5 fL (ref 80.0–100.0)
Platelets: 253 10*3/uL (ref 150–400)
RBC: 4.96 MIL/uL (ref 3.87–5.11)
RDW: 12.1 % (ref 11.5–15.5)
WBC: 9.5 10*3/uL (ref 4.0–10.5)
nRBC: 0 % (ref 0.0–0.2)

## 2022-07-09 LAB — BASIC METABOLIC PANEL
Anion gap: 6 (ref 5–15)
BUN: 18 mg/dL (ref 6–20)
CO2: 23 mmol/L (ref 22–32)
Calcium: 9.2 mg/dL (ref 8.9–10.3)
Chloride: 108 mmol/L (ref 98–111)
Creatinine, Ser: 1.02 mg/dL — ABNORMAL HIGH (ref 0.44–1.00)
GFR, Estimated: >60 mL/min (ref >60)
Glucose, Bld: 160 mg/dL — ABNORMAL HIGH (ref 70–99)
Potassium: 3.3 mmol/L — ABNORMAL LOW (ref 3.5–5.1)
Sodium: 137 mmol/L (ref 135–145)

## 2022-07-09 LAB — URINALYSIS, ROUTINE W REFLEX MICROSCOPIC
Bacteria, UA: NONE SEEN
Bilirubin Urine: NEGATIVE
Glucose, UA: NEGATIVE mg/dL
Hgb urine dipstick: NEGATIVE
Ketones, ur: NEGATIVE mg/dL
Leukocytes,Ua: NEGATIVE
Nitrite: NEGATIVE
Protein, ur: NEGATIVE mg/dL
Specific Gravity, Urine: 1.016 (ref 1.005–1.030)
pH: 5 (ref 5.0–8.0)

## 2022-07-09 LAB — POC URINE PREG, ED: Preg Test, Ur: NEGATIVE

## 2022-07-09 LAB — SARS CORONAVIRUS 2 BY RT PCR: SARS Coronavirus 2 by RT PCR: NEGATIVE

## 2022-07-09 LAB — TROPONIN I (HIGH SENSITIVITY): Troponin I (High Sensitivity): 3 ng/L (ref <18)

## 2022-07-09 LAB — D-DIMER, QUANTITATIVE: D-Dimer, Quant: 0.34 ug/mL-FEU (ref 0.00–0.50)

## 2022-07-09 MED ORDER — SODIUM CHLORIDE 0.9 % IV BOLUS
1000.0000 mL | Freq: Once | INTRAVENOUS | Status: AC
  Administered 2022-07-09: 1000 mL via INTRAVENOUS

## 2022-07-09 NOTE — ED Triage Notes (Signed)
Pt here with weakness x1 week. Pt was told to come to the ED by her primary, 70/50 last night. Pt states she has felt very week the last few days. Pt states left arm pain and tingling and states her fingers feel funny. She states when she wakes up her fingers are numb.

## 2022-07-09 NOTE — ED Provider Notes (Signed)
[facility] Provider Note    Event Date/Time   First MD Initiated Contact with Patient 07/09/22 1356     (approximate)   History   Weakness   HPI  Abigail [name] is a 57 y.o. female with adrenal insufficiency CKD anxiety asthma hypertension is the ER for evaluation of generalized malaise as well as some low blood pressures.  Was told to stop her Norvasc states that her blood pressure in neurology clinic yesterday was 70 systolic.  She was walked around and drink couple water and on repeat it was 90.  States that she has had decreased p.o. intake.  Denies any abdominal pain no nausea vomiting or diarrhea.  No new medication changes.     Physical Exam   Triage Vital Signs: ED Triage Vitals  Enc Vitals Group     BP 07/09/22 1256 112/82     Pulse Rate 07/09/22 1256 (!) 109     Resp 07/09/22 1256 16     Temp 07/09/22 1256 97.8 F (36.6 C)     Temp Source 07/09/22 1256 Oral     SpO2 07/09/22 1256 99 %     Weight 07/09/22 1257 154 lb 15.7 oz (70.3 kg)     Height 07/09/22 1257 '5\' 5"'$  (1.651 m)     Head Circumference --      Peak Flow --      Pain Score 07/09/22 1257 7     Pain Loc --      Pain Edu? --      Excl. in [place]? --     Most recent vital signs: Vitals:   07/09/22 1600 07/09/22 1615  BP: 112/70 127/72  Pulse: 86 88  Resp: 18 18  Temp:    SpO2: 96% 95%     Constitutional: Alert  Eyes: Conjunctivae are normal.  Head: Atraumatic. Nose: No congestion/rhinnorhea. Mouth/Throat: Mucous membranes are moist.   Neck: Painless ROM.  Cardiovascular:   Good peripheral circulation. Respiratory: Normal respiratory effort.  No retractions.  Gastrointestinal: Soft and nontender.  Musculoskeletal:  no deformity Neurologic:  MAE spontaneously. No gross focal neurologic deficits are appreciated.  Skin:  Skin is warm, dry and intact. No rash noted. Psychiatric: Mood and affect are normal. Speech and behavior are normal.    ED Results / Procedures  / Treatments   Labs (all labs ordered are listed, but only abnormal results are displayed) Labs Reviewed  BASIC METABOLIC PANEL - Abnormal; Notable for the following components:      Result Value   Potassium 3.3 (*)    Glucose, Bld 160 (*)    Creatinine, Ser 1.02 (*)    All other components within normal limits  URINALYSIS, ROUTINE W REFLEX MICROSCOPIC - Abnormal; Notable for the following components:   Color, Urine YELLOW (*)    APPearance HAZY (*)    All other components within normal limits  SARS CORONAVIRUS 2 BY RT PCR  CBC  D-DIMER, QUANTITATIVE  CBG MONITORING, ED  POC URINE PREG, ED  TROPONIN I (HIGH SENSITIVITY)     EKG  ED ECG REPORT I, Merlyn Lot, the attending physician, personally viewed and interpreted this ECG.   Date: 07/09/2022  EKG Time: 13:01  Rate: 110  Rhythm: sinus  Axis: normal  Intervals: normal  ST&T Change: nonspecific st abn, no stemi    RADIOLOGY Please see ED Course for my review and interpretation.  I personally reviewed all radiographic images ordered to evaluate for the above acute complaints  and reviewed radiology reports and findings.  These findings were personally discussed with the patient.  Please see medical record for radiology report.    PROCEDURES:  Critical Care performed: No  Procedures   MEDICATIONS ORDERED IN ED: Medications  sodium chloride 0.9 % bolus 1,000 mL (1,000 mLs Intravenous New Bag/Given 07/09/22 1546)     IMPRESSION / MDM / ASSESSMENT AND PLAN / ED COURSE  I reviewed the triage vital signs and the nursing notes.                              Differential diagnosis includes, but is not limited to, duration, electrolyte abnormality, anemia, sepsis, ACS, medication effect  Patient presenting to the ER for evaluation of symptoms as described above.  Base on symptoms, risk factors and considered above differential, this presenting complaint could reflect a potentially life-threatening illness  therefore the patient will be placed on continuous pulse oximetry and telemetry for monitoring.  Laboratory evaluation will be sent to evaluate for the above complaints.      Clinical Course as of 07/09/22 1629  Fri Jul 09, 2022  1626 X-ray on my review and interpretation does not show any evidence of consolidation.  Patient be signed out to oncoming physician pending follow-up blood work reassessment after fluids.  Anticipate discharge home if blood work and work-up reassuring. [PR]    Clinical Course User Index [PR] Merlyn Lot, MD    FINAL CLINICAL IMPRESSION(S) / ED DIAGNOSES   Final diagnoses:  Dehydration  Generalized weakness     Rx / DC Orders   ED Discharge Orders     None        Note:  This document was prepared using Dragon voice recognition software and may include unintentional dictation errors.    Merlyn Lot, MD 07/09/22 [phone]

## 2022-07-14 ENCOUNTER — Telehealth: Payer: Self-pay | Admitting: *Deleted

## 2022-07-14 NOTE — Telephone Encounter (Signed)
     Patient  visit on 07/09/2022  at [facility] was for treatment  Have you been able to follow up with your primary care physician? Talked with Pcp   The patient was   able to obtain any needed medicine or equipment.  Are there diet recommendations that you are having difficulty following?  Patient expresses understanding of discharge instructions and education provided has no other needs at this time.   [place] [phone] [address] , [place] [address] Email : [email or web address]

## 2022-07-22 ENCOUNTER — Telehealth: Payer: Self-pay

## 2022-07-22 NOTE — Patient Outreach (Signed)
  Care Coordination   07/22/2022 Name: Abigail [name] MRN: [id number] DOB: 28-Dec-1965   Care Coordination Outreach Attempts:  An unsuccessful telephone outreach was attempted today to offer the patient information about available care coordination services as a benefit of their health plan.   Follow Up Plan:  Additional outreach attempts will be made to offer the patient care coordination information and services.   Encounter Outcome:  No Answer  Care Coordination Interventions Activated:  No   Care Coordination Interventions:  No, not indicated    Noreene Larsson RN, MSN, [facility]  Mobile: [phone]

## 2022-08-31 DIAGNOSIS — M2550 Pain in unspecified joint: Secondary | ICD-10-CM | POA: Diagnosis not present

## 2022-08-31 DIAGNOSIS — R739 Hyperglycemia, unspecified: Secondary | ICD-10-CM | POA: Diagnosis not present

## 2022-08-31 DIAGNOSIS — E7800 Pure hypercholesterolemia, unspecified: Secondary | ICD-10-CM | POA: Diagnosis not present

## 2022-08-31 DIAGNOSIS — Z79899 Other long term (current) drug therapy: Secondary | ICD-10-CM | POA: Diagnosis not present

## 2022-09-09 DIAGNOSIS — Z79899 Other long term (current) drug therapy: Secondary | ICD-10-CM | POA: Diagnosis not present

## 2022-09-09 DIAGNOSIS — Z0000 Encounter for general adult medical examination without abnormal findings: Secondary | ICD-10-CM | POA: Diagnosis not present

## 2022-09-09 DIAGNOSIS — Z1331 Encounter for screening for depression: Secondary | ICD-10-CM | POA: Diagnosis not present

## 2022-10-26 DIAGNOSIS — E2740 Unspecified adrenocortical insufficiency: Secondary | ICD-10-CM | POA: Diagnosis not present

## 2022-10-26 DIAGNOSIS — E0789 Other specified disorders of thyroid: Secondary | ICD-10-CM | POA: Diagnosis not present

## 2022-10-27 DIAGNOSIS — E2740 Unspecified adrenocortical insufficiency: Secondary | ICD-10-CM | POA: Diagnosis not present

## 2022-10-27 DIAGNOSIS — E0789 Other specified disorders of thyroid: Secondary | ICD-10-CM | POA: Diagnosis not present

## 2023-01-05 DIAGNOSIS — H538 Other visual disturbances: Secondary | ICD-10-CM | POA: Diagnosis not present

## 2023-01-05 DIAGNOSIS — R519 Headache, unspecified: Secondary | ICD-10-CM | POA: Diagnosis not present

## 2023-01-05 DIAGNOSIS — R42 Dizziness and giddiness: Secondary | ICD-10-CM | POA: Diagnosis not present

## 2023-01-05 DIAGNOSIS — R251 Tremor, unspecified: Secondary | ICD-10-CM | POA: Diagnosis not present

## 2023-01-05 DIAGNOSIS — R2689 Other abnormalities of gait and mobility: Secondary | ICD-10-CM | POA: Diagnosis not present

## 2023-01-05 DIAGNOSIS — R531 Weakness: Secondary | ICD-10-CM | POA: Diagnosis not present

## 2023-03-03 DIAGNOSIS — R739 Hyperglycemia, unspecified: Secondary | ICD-10-CM | POA: Diagnosis not present

## 2023-03-03 DIAGNOSIS — Z79899 Other long term (current) drug therapy: Secondary | ICD-10-CM | POA: Diagnosis not present

## 2023-03-03 DIAGNOSIS — E7800 Pure hypercholesterolemia, unspecified: Secondary | ICD-10-CM | POA: Diagnosis not present

## 2023-03-10 DIAGNOSIS — F32A Depression, unspecified: Secondary | ICD-10-CM | POA: Diagnosis not present

## 2023-03-10 DIAGNOSIS — N1831 Chronic kidney disease, stage 3a: Secondary | ICD-10-CM | POA: Diagnosis not present

## 2023-03-10 DIAGNOSIS — I129 Hypertensive chronic kidney disease with stage 1 through stage 4 chronic kidney disease, or unspecified chronic kidney disease: Secondary | ICD-10-CM | POA: Diagnosis not present

## 2023-03-10 DIAGNOSIS — E785 Hyperlipidemia, unspecified: Secondary | ICD-10-CM | POA: Diagnosis not present

## 2023-03-10 DIAGNOSIS — J449 Chronic obstructive pulmonary disease, unspecified: Secondary | ICD-10-CM | POA: Diagnosis not present

## 2023-03-10 DIAGNOSIS — Z79899 Other long term (current) drug therapy: Secondary | ICD-10-CM | POA: Diagnosis not present

## 2023-06-16 ENCOUNTER — Other Ambulatory Visit: Payer: Self-pay | Admitting: Family Medicine

## 2023-06-16 DIAGNOSIS — Z1231 Encounter for screening mammogram for malignant neoplasm of breast: Secondary | ICD-10-CM

## 2023-07-05 ENCOUNTER — Ambulatory Visit
Admission: RE | Admit: 2023-07-05 | Discharge: 2023-07-05 | Disposition: A | Discharge status: Home / Self Care | Payer: Medicare HMO | Source: Ambulatory Visit | Attending: Family Medicine | Admitting: Family Medicine

## 2023-07-05 DIAGNOSIS — Z1231 Encounter for screening mammogram for malignant neoplasm of breast: Secondary | ICD-10-CM | POA: Insufficient documentation

## 2023-09-05 DIAGNOSIS — Z79899 Other long term (current) drug therapy: Secondary | ICD-10-CM | POA: Diagnosis not present

## 2023-09-05 DIAGNOSIS — N1831 Chronic kidney disease, stage 3a: Secondary | ICD-10-CM | POA: Diagnosis not present

## 2023-09-05 DIAGNOSIS — R7303 Prediabetes: Secondary | ICD-10-CM | POA: Diagnosis not present

## 2023-09-05 DIAGNOSIS — E7800 Pure hypercholesterolemia, unspecified: Secondary | ICD-10-CM | POA: Diagnosis not present

## 2023-09-12 DIAGNOSIS — Z1211 Encounter for screening for malignant neoplasm of colon: Secondary | ICD-10-CM | POA: Diagnosis not present

## 2023-09-12 DIAGNOSIS — Z1331 Encounter for screening for depression: Secondary | ICD-10-CM | POA: Diagnosis not present

## 2023-09-12 DIAGNOSIS — Z0000 Encounter for general adult medical examination without abnormal findings: Secondary | ICD-10-CM | POA: Diagnosis not present

## 2023-09-12 DIAGNOSIS — Z79899 Other long term (current) drug therapy: Secondary | ICD-10-CM | POA: Diagnosis not present

## 2024-01-04 DIAGNOSIS — R42 Dizziness and giddiness: Secondary | ICD-10-CM | POA: Diagnosis not present

## 2024-01-04 DIAGNOSIS — R2689 Other abnormalities of gait and mobility: Secondary | ICD-10-CM | POA: Diagnosis not present

## 2024-01-04 DIAGNOSIS — H538 Other visual disturbances: Secondary | ICD-10-CM | POA: Diagnosis not present

## 2024-01-04 DIAGNOSIS — R519 Headache, unspecified: Secondary | ICD-10-CM | POA: Diagnosis not present

## 2024-01-04 DIAGNOSIS — R251 Tremor, unspecified: Secondary | ICD-10-CM | POA: Diagnosis not present

## 2024-01-04 DIAGNOSIS — R531 Weakness: Secondary | ICD-10-CM | POA: Diagnosis not present

## 2024-01-10 ENCOUNTER — Other Ambulatory Visit: Payer: Self-pay | Admitting: Physician Assistant

## 2024-01-10 DIAGNOSIS — R519 Headache, unspecified: Secondary | ICD-10-CM

## 2024-01-10 DIAGNOSIS — R29818 Other symptoms and signs involving the nervous system: Secondary | ICD-10-CM

## 2024-01-10 DIAGNOSIS — H532 Diplopia: Secondary | ICD-10-CM

## 2024-01-16 ENCOUNTER — Encounter: Payer: Self-pay | Admitting: Physician Assistant

## 2024-02-07 ENCOUNTER — Inpatient Hospital Stay: Admission: RE | Admit: 2024-02-07 | Source: Ambulatory Visit

## 2024-02-20 ENCOUNTER — Ambulatory Visit
Admission: RE | Admit: 2024-02-20 | Discharge: 2024-02-20 | Disposition: A | Discharge status: Home / Self Care | Source: Ambulatory Visit | Attending: Physician Assistant | Admitting: Physician Assistant

## 2024-02-20 DIAGNOSIS — R531 Weakness: Secondary | ICD-10-CM | POA: Diagnosis not present

## 2024-02-20 DIAGNOSIS — R29818 Other symptoms and signs involving the nervous system: Secondary | ICD-10-CM

## 2024-02-20 DIAGNOSIS — H532 Diplopia: Secondary | ICD-10-CM

## 2024-02-20 DIAGNOSIS — R519 Headache, unspecified: Secondary | ICD-10-CM | POA: Diagnosis not present

## 2024-02-20 MED ORDER — GADOPICLENOL 0.5 MMOL/ML IV SOLN
6.0000 mL | Freq: Once | INTRAVENOUS | Status: AC | PRN
Start: 2024-02-20 — End: 2024-02-20
  Administered 2024-02-20: 6 mL via INTRAVENOUS

## 2024-02-28 DIAGNOSIS — R7303 Prediabetes: Secondary | ICD-10-CM | POA: Diagnosis not present

## 2024-02-28 DIAGNOSIS — E7800 Pure hypercholesterolemia, unspecified: Secondary | ICD-10-CM | POA: Diagnosis not present

## 2024-02-28 DIAGNOSIS — N1831 Chronic kidney disease, stage 3a: Secondary | ICD-10-CM | POA: Diagnosis not present

## 2024-02-28 DIAGNOSIS — Z79899 Other long term (current) drug therapy: Secondary | ICD-10-CM | POA: Diagnosis not present

## 2024-03-06 DIAGNOSIS — F329 Major depressive disorder, single episode, unspecified: Secondary | ICD-10-CM | POA: Diagnosis not present

## 2024-03-06 DIAGNOSIS — Z79899 Other long term (current) drug therapy: Secondary | ICD-10-CM | POA: Diagnosis not present

## 2024-03-06 DIAGNOSIS — N189 Chronic kidney disease, unspecified: Secondary | ICD-10-CM | POA: Diagnosis not present

## 2024-03-06 DIAGNOSIS — J449 Chronic obstructive pulmonary disease, unspecified: Secondary | ICD-10-CM | POA: Diagnosis not present

## 2024-03-06 DIAGNOSIS — I129 Hypertensive chronic kidney disease with stage 1 through stage 4 chronic kidney disease, or unspecified chronic kidney disease: Secondary | ICD-10-CM | POA: Diagnosis not present

## 2024-03-06 DIAGNOSIS — R7303 Prediabetes: Secondary | ICD-10-CM | POA: Diagnosis not present

## 2024-03-06 DIAGNOSIS — F17210 Nicotine dependence, cigarettes, uncomplicated: Secondary | ICD-10-CM | POA: Diagnosis not present

## 2024-03-06 DIAGNOSIS — E785 Hyperlipidemia, unspecified: Secondary | ICD-10-CM | POA: Diagnosis not present

## 2024-03-09 DIAGNOSIS — K649 Unspecified hemorrhoids: Secondary | ICD-10-CM | POA: Diagnosis not present

## 2024-03-09 DIAGNOSIS — K5909 Other constipation: Secondary | ICD-10-CM | POA: Diagnosis not present

## 2024-03-09 DIAGNOSIS — K219 Gastro-esophageal reflux disease without esophagitis: Secondary | ICD-10-CM | POA: Diagnosis not present

## 2024-03-09 DIAGNOSIS — Z1211 Encounter for screening for malignant neoplasm of colon: Secondary | ICD-10-CM | POA: Diagnosis not present

## 2024-04-06 ENCOUNTER — Ambulatory Visit: Payer: Self-pay

## 2024-04-06 DIAGNOSIS — K2970 Gastritis, unspecified, without bleeding: Secondary | ICD-10-CM | POA: Diagnosis not present

## 2024-04-06 DIAGNOSIS — D126 Benign neoplasm of colon, unspecified: Secondary | ICD-10-CM | POA: Diagnosis not present

## 2024-04-06 DIAGNOSIS — K219 Gastro-esophageal reflux disease without esophagitis: Secondary | ICD-10-CM | POA: Diagnosis not present

## 2024-04-06 DIAGNOSIS — K640 First degree hemorrhoids: Secondary | ICD-10-CM | POA: Diagnosis not present

## 2024-04-06 DIAGNOSIS — D123 Benign neoplasm of transverse colon: Secondary | ICD-10-CM | POA: Diagnosis not present

## 2024-04-06 DIAGNOSIS — Z1211 Encounter for screening for malignant neoplasm of colon: Secondary | ICD-10-CM | POA: Diagnosis not present

## 2024-04-06 DIAGNOSIS — K635 Polyp of colon: Secondary | ICD-10-CM | POA: Diagnosis not present

## 2024-04-06 DIAGNOSIS — K222 Esophageal obstruction: Secondary | ICD-10-CM | POA: Diagnosis not present

## 2024-04-06 DIAGNOSIS — K2289 Other specified disease of esophagus: Secondary | ICD-10-CM | POA: Diagnosis not present

## 2024-07-03 ENCOUNTER — Other Ambulatory Visit: Payer: Self-pay | Admitting: Family Medicine

## 2024-07-03 DIAGNOSIS — Z1231 Encounter for screening mammogram for malignant neoplasm of breast: Secondary | ICD-10-CM

## 2024-07-25 ENCOUNTER — Encounter

## 2024-08-28 DIAGNOSIS — J441 Chronic obstructive pulmonary disease with (acute) exacerbation: Secondary | ICD-10-CM | POA: Diagnosis not present

## 2024-09-10 DIAGNOSIS — E7800 Pure hypercholesterolemia, unspecified: Secondary | ICD-10-CM | POA: Diagnosis not present

## 2024-09-10 DIAGNOSIS — I10 Essential (primary) hypertension: Secondary | ICD-10-CM | POA: Diagnosis not present

## 2024-09-10 DIAGNOSIS — N1831 Chronic kidney disease, stage 3a: Secondary | ICD-10-CM | POA: Diagnosis not present

## 2024-09-10 DIAGNOSIS — R7303 Prediabetes: Secondary | ICD-10-CM | POA: Diagnosis not present

## 2024-09-10 DIAGNOSIS — Z79899 Other long term (current) drug therapy: Secondary | ICD-10-CM | POA: Diagnosis not present
